# Patient Record
Sex: Female | Born: 1980 | Race: Black or African American | Hispanic: No | Marital: Single | Smoking: Former smoker
Health system: Southern US, Community
[De-identification: ages and names within clinical notes are randomized; demographics above are authoritative.]

## PROBLEM LIST (undated history)

## (undated) DIAGNOSIS — M329 Systemic lupus erythematosus, unspecified: Principal | ICD-10-CM

## (undated) DIAGNOSIS — IMO0002 Reserved for concepts with insufficient information to code with codable children: Secondary | ICD-10-CM

## (undated) DIAGNOSIS — D649 Anemia, unspecified: Secondary | ICD-10-CM

## (undated) DIAGNOSIS — R51 Headache: Secondary | ICD-10-CM

## (undated) DIAGNOSIS — M3214 Glomerular disease in systemic lupus erythematosus: Secondary | ICD-10-CM

## (undated) DIAGNOSIS — I313 Pericardial effusion (noninflammatory): Secondary | ICD-10-CM

## (undated) DIAGNOSIS — F329 Major depressive disorder, single episode, unspecified: Secondary | ICD-10-CM

## (undated) DIAGNOSIS — M879 Osteonecrosis, unspecified: Secondary | ICD-10-CM

## (undated) DIAGNOSIS — M1990 Unspecified osteoarthritis, unspecified site: Secondary | ICD-10-CM

## (undated) DIAGNOSIS — F419 Anxiety disorder, unspecified: Secondary | ICD-10-CM

## (undated) DIAGNOSIS — F32A Depression, unspecified: Secondary | ICD-10-CM

## (undated) DIAGNOSIS — I3139 Other pericardial effusion (noninflammatory): Secondary | ICD-10-CM

## (undated) DIAGNOSIS — Z8619 Personal history of other infectious and parasitic diseases: Secondary | ICD-10-CM

---
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
## 2002-03-13 ENCOUNTER — Emergency Department (HOSPITAL_COMMUNITY): Admission: EM | Admit: 2002-03-13 | Discharge: 2002-03-13 | Payer: Self-pay | Admitting: Emergency Medicine

## 2002-03-14 ENCOUNTER — Inpatient Hospital Stay (HOSPITAL_COMMUNITY): Admission: AD | Admit: 2002-03-14 | Discharge: 2002-03-14 | Payer: Self-pay | Admitting: Obstetrics and Gynecology

## 2002-03-14 ENCOUNTER — Encounter: Payer: Self-pay | Admitting: Emergency Medicine

## 2002-10-05 ENCOUNTER — Inpatient Hospital Stay (HOSPITAL_COMMUNITY): Admission: AD | Admit: 2002-10-05 | Discharge: 2002-10-05 | Payer: Self-pay | Admitting: *Deleted

## 2002-12-17 ENCOUNTER — Emergency Department (HOSPITAL_COMMUNITY): Admission: EM | Admit: 2002-12-17 | Discharge: 2002-12-17 | Payer: Self-pay | Admitting: Emergency Medicine

## 2002-12-17 ENCOUNTER — Encounter: Payer: Self-pay | Admitting: Emergency Medicine

## 2002-12-29 ENCOUNTER — Ambulatory Visit (HOSPITAL_COMMUNITY): Admission: RE | Admit: 2002-12-29 | Discharge: 2002-12-29 | Payer: Self-pay | Admitting: Obstetrics and Gynecology

## 2002-12-29 ENCOUNTER — Encounter: Payer: Self-pay | Admitting: Obstetrics and Gynecology

## 2003-11-22 ENCOUNTER — Inpatient Hospital Stay (HOSPITAL_COMMUNITY): Admission: AD | Admit: 2003-11-22 | Discharge: 2003-11-22 | Payer: Self-pay | Admitting: Obstetrics & Gynecology

## 2004-01-11 ENCOUNTER — Other Ambulatory Visit: Admission: RE | Admit: 2004-01-11 | Discharge: 2004-01-11 | Payer: Self-pay | Admitting: Obstetrics and Gynecology

## 2004-04-06 ENCOUNTER — Emergency Department (HOSPITAL_COMMUNITY): Admission: EM | Admit: 2004-04-06 | Discharge: 2004-04-07 | Payer: Self-pay | Admitting: Emergency Medicine

## 2006-01-13 ENCOUNTER — Emergency Department (HOSPITAL_COMMUNITY): Admission: EM | Admit: 2006-01-13 | Discharge: 2006-01-13 | Payer: Self-pay | Admitting: Emergency Medicine

## 2006-01-18 ENCOUNTER — Inpatient Hospital Stay (HOSPITAL_COMMUNITY): Admission: AD | Admit: 2006-01-18 | Discharge: 2006-01-18 | Payer: Self-pay | Admitting: Obstetrics and Gynecology

## 2006-01-30 ENCOUNTER — Other Ambulatory Visit: Admission: RE | Admit: 2006-01-30 | Discharge: 2006-01-30 | Payer: Self-pay | Admitting: Obstetrics and Gynecology

## 2006-04-16 ENCOUNTER — Inpatient Hospital Stay (HOSPITAL_COMMUNITY): Admission: AD | Admit: 2006-04-16 | Discharge: 2006-04-16 | Payer: Self-pay | Admitting: Obstetrics and Gynecology

## 2006-05-29 ENCOUNTER — Inpatient Hospital Stay (HOSPITAL_COMMUNITY): Admission: AD | Admit: 2006-05-29 | Discharge: 2006-05-29 | Payer: Self-pay | Admitting: Gynecology

## 2006-08-01 ENCOUNTER — Inpatient Hospital Stay (HOSPITAL_COMMUNITY): Admission: AD | Admit: 2006-08-01 | Discharge: 2006-08-04 | Payer: Self-pay | Admitting: Obstetrics and Gynecology

## 2009-10-31 HISTORY — PX: DILATION AND EVACUATION: SHX1459

## 2009-12-31 DIAGNOSIS — Z8619 Personal history of other infectious and parasitic diseases: Secondary | ICD-10-CM

## 2009-12-31 HISTORY — DX: Personal history of other infectious and parasitic diseases: Z86.19

## 2010-03-29 ENCOUNTER — Ambulatory Visit: Payer: Self-pay | Admitting: Cardiovascular Disease

## 2010-03-29 ENCOUNTER — Inpatient Hospital Stay (HOSPITAL_COMMUNITY): Admission: EM | Admit: 2010-03-29 | Discharge: 2010-04-15 | Payer: Self-pay | Admitting: Emergency Medicine

## 2010-03-31 ENCOUNTER — Encounter (INDEPENDENT_AMBULATORY_CARE_PROVIDER_SITE_OTHER): Payer: Self-pay | Admitting: Internal Medicine

## 2010-04-04 ENCOUNTER — Encounter (INDEPENDENT_AMBULATORY_CARE_PROVIDER_SITE_OTHER): Payer: Self-pay | Admitting: Internal Medicine

## 2010-04-07 ENCOUNTER — Encounter (INDEPENDENT_AMBULATORY_CARE_PROVIDER_SITE_OTHER): Payer: Self-pay | Admitting: Internal Medicine

## 2010-04-14 ENCOUNTER — Encounter: Payer: Self-pay | Admitting: Emergency Medicine

## 2010-04-22 ENCOUNTER — Inpatient Hospital Stay (HOSPITAL_COMMUNITY): Admission: EM | Admit: 2010-04-22 | Discharge: 2010-04-27 | Payer: Self-pay | Admitting: Emergency Medicine

## 2010-05-10 ENCOUNTER — Emergency Department (HOSPITAL_COMMUNITY): Admission: EM | Admit: 2010-05-10 | Discharge: 2010-05-11 | Payer: Self-pay | Admitting: Emergency Medicine

## 2010-06-24 ENCOUNTER — Emergency Department (HOSPITAL_COMMUNITY): Admission: EM | Admit: 2010-06-24 | Discharge: 2010-06-24 | Payer: Self-pay | Admitting: Emergency Medicine

## 2010-06-28 ENCOUNTER — Inpatient Hospital Stay (HOSPITAL_COMMUNITY): Admission: EM | Admit: 2010-06-28 | Discharge: 2010-07-01 | Payer: Self-pay | Admitting: Emergency Medicine

## 2010-07-08 ENCOUNTER — Inpatient Hospital Stay (HOSPITAL_COMMUNITY): Admission: EM | Admit: 2010-07-08 | Discharge: 2010-07-17 | Payer: Self-pay | Admitting: Emergency Medicine

## 2010-09-23 ENCOUNTER — Emergency Department (HOSPITAL_COMMUNITY): Admission: EM | Admit: 2010-09-23 | Discharge: 2010-09-23 | Payer: Self-pay | Admitting: Emergency Medicine

## 2010-11-20 ENCOUNTER — Emergency Department (HOSPITAL_COMMUNITY): Admission: EM | Admit: 2010-11-20 | Discharge: 2010-11-20 | Payer: Self-pay | Admitting: Emergency Medicine

## 2010-11-22 ENCOUNTER — Observation Stay (HOSPITAL_COMMUNITY)
Admission: EM | Admit: 2010-11-22 | Discharge: 2010-11-25 | Payer: Self-pay | Source: Home / Self Care | Admitting: Emergency Medicine

## 2010-11-30 ENCOUNTER — Observation Stay (HOSPITAL_COMMUNITY)
Admission: EM | Admit: 2010-11-30 | Discharge: 2010-12-02 | Payer: Self-pay | Source: Home / Self Care | Admitting: Emergency Medicine

## 2010-12-31 DIAGNOSIS — Z8619 Personal history of other infectious and parasitic diseases: Secondary | ICD-10-CM

## 2010-12-31 HISTORY — DX: Personal history of other infectious and parasitic diseases: Z86.19

## 2011-01-31 ENCOUNTER — Emergency Department (HOSPITAL_COMMUNITY)
Admission: EM | Admit: 2011-01-31 | Discharge: 2011-01-31 | Disposition: A | Discharge status: Home / Self Care | Payer: Medicaid Other | Attending: Emergency Medicine | Admitting: Emergency Medicine

## 2011-01-31 DIAGNOSIS — R21 Rash and other nonspecific skin eruption: Secondary | ICD-10-CM | POA: Insufficient documentation

## 2011-01-31 DIAGNOSIS — N898 Other specified noninflammatory disorders of vagina: Secondary | ICD-10-CM | POA: Insufficient documentation

## 2011-01-31 DIAGNOSIS — R109 Unspecified abdominal pain: Secondary | ICD-10-CM | POA: Insufficient documentation

## 2011-01-31 DIAGNOSIS — M329 Systemic lupus erythematosus, unspecified: Secondary | ICD-10-CM | POA: Insufficient documentation

## 2011-01-31 DIAGNOSIS — R51 Headache: Secondary | ICD-10-CM | POA: Insufficient documentation

## 2011-01-31 DIAGNOSIS — IMO0001 Reserved for inherently not codable concepts without codable children: Secondary | ICD-10-CM | POA: Insufficient documentation

## 2011-01-31 LAB — COMPREHENSIVE METABOLIC PANEL
AST: 23 U/L (ref 0–37)
Albumin: 3.5 g/dL (ref 3.5–5.2)
Alkaline Phosphatase: 51 U/L (ref 39–117)
BUN: 7 mg/dL (ref 6–23)
CO2: 26 mEq/L (ref 19–32)
Calcium: 9.1 mg/dL (ref 8.4–10.5)
Creatinine, Ser: 0.62 mg/dL (ref 0.4–1.2)
GFR calc Af Amer: >60 mL/min (ref >60)
Glucose, Bld: 87 mg/dL (ref 70–99)
Sodium: 139 mEq/L (ref 135–145)

## 2011-01-31 LAB — URINE MICROSCOPIC-ADD ON

## 2011-01-31 LAB — URINALYSIS, ROUTINE W REFLEX MICROSCOPIC
Bilirubin Urine: NEGATIVE
Leukocytes, UA: NEGATIVE
Protein, ur: 100 mg/dL — AB
Specific Gravity, Urine: 1.013 (ref 1.005–1.030)
Urobilinogen, UA: 0.2 mg/dL (ref 0.0–1.0)
pH: 7.5 (ref 5.0–8.0)

## 2011-01-31 LAB — ACETAMINOPHEN LEVEL: Acetaminophen (Tylenol), Serum: <10 ug/mL — ABNORMAL LOW (ref 10–30)

## 2011-01-31 LAB — CBC
MCH: 30.2 pg (ref 26.0–34.0)
MCHC: 32.4 g/dL (ref 30.0–36.0)
MCV: 93.2 fL (ref 78.0–100.0)
RDW: 12.8 % (ref 11.5–15.5)
WBC: 4 10*3/uL (ref 4.0–10.5)

## 2011-01-31 LAB — DIFFERENTIAL
Lymphocytes Relative: 34 % (ref 12–46)
Lymphs Abs: 1.4 10*3/uL (ref 0.7–4.0)

## 2011-03-10 ENCOUNTER — Emergency Department (HOSPITAL_COMMUNITY)
Admission: EM | Admit: 2011-03-10 | Discharge: 2011-03-10 | Disposition: A | Discharge status: Home / Self Care | Payer: Medicaid Other | Attending: Emergency Medicine | Admitting: Emergency Medicine

## 2011-03-10 DIAGNOSIS — O169 Unspecified maternal hypertension, unspecified trimester: Secondary | ICD-10-CM | POA: Insufficient documentation

## 2011-03-10 DIAGNOSIS — IMO0001 Reserved for inherently not codable concepts without codable children: Secondary | ICD-10-CM | POA: Insufficient documentation

## 2011-03-10 DIAGNOSIS — O99891 Other specified diseases and conditions complicating pregnancy: Secondary | ICD-10-CM | POA: Insufficient documentation

## 2011-03-10 DIAGNOSIS — M329 Systemic lupus erythematosus, unspecified: Secondary | ICD-10-CM | POA: Insufficient documentation

## 2011-03-10 DIAGNOSIS — R21 Rash and other nonspecific skin eruption: Secondary | ICD-10-CM | POA: Insufficient documentation

## 2011-03-10 LAB — CBC
HCT: 35.2 % — ABNORMAL LOW (ref 36.0–46.0)
MCH: 29.9 pg (ref 26.0–34.0)
MCV: 91.4 fL (ref 78.0–100.0)
Platelets: 229 10*3/uL (ref 150–400)
RBC: 3.85 MIL/uL — ABNORMAL LOW (ref 3.87–5.11)
WBC: 3.5 10*3/uL — ABNORMAL LOW (ref 4.0–10.5)

## 2011-03-10 LAB — URINE MICROSCOPIC-ADD ON

## 2011-03-10 LAB — DIFFERENTIAL
Basophils Relative: 2 % — ABNORMAL HIGH (ref 0–1)
Eosinophils Relative: 2 % (ref 0–5)
Lymphs Abs: 1 10*3/uL (ref 0.7–4.0)

## 2011-03-10 LAB — POCT I-STAT, CHEM 8
BUN: 7 mg/dL (ref 6–23)
Chloride: 110 mEq/L (ref 96–112)
Hemoglobin: 12.6 g/dL (ref 12.0–15.0)
Sodium: 141 mEq/L (ref 135–145)
TCO2: 19 mmol/L (ref 0–100)

## 2011-03-10 LAB — URINALYSIS, ROUTINE W REFLEX MICROSCOPIC
Bilirubin Urine: NEGATIVE
Leukocytes, UA: NEGATIVE
Protein, ur: >300 mg/dL — AB
Specific Gravity, Urine: 1.034 — ABNORMAL HIGH (ref 1.005–1.030)
pH: 7 (ref 5.0–8.0)

## 2011-03-12 LAB — IRON AND TIBC
Iron: 64 ug/dL (ref 42–135)
Saturation Ratios: 25 % (ref 20–55)
UIBC: 191 ug/dL

## 2011-03-12 LAB — CBC
HCT: 28.7 % — ABNORMAL LOW (ref 36.0–46.0)
HCT: 32.5 % — ABNORMAL LOW (ref 36.0–46.0)
Hemoglobin: 9.2 g/dL — ABNORMAL LOW (ref 12.0–15.0)
MCH: 32.3 pg (ref 26.0–34.0)
Platelets: 364 10*3/uL (ref 150–400)
RBC: 3.25 MIL/uL — ABNORMAL LOW (ref 3.87–5.11)
RBC: 3.34 MIL/uL — ABNORMAL LOW (ref 3.87–5.11)
RDW: 14.4 % (ref 11.5–15.5)
WBC: 5.2 10*3/uL (ref 4.0–10.5)
WBC: 8.1 10*3/uL (ref 4.0–10.5)

## 2011-03-12 LAB — COMPREHENSIVE METABOLIC PANEL
ALT: 14 U/L (ref 0–35)
AST: 15 U/L (ref 0–37)
Albumin: 3.1 g/dL — ABNORMAL LOW (ref 3.5–5.2)
Alkaline Phosphatase: 36 U/L — ABNORMAL LOW (ref 39–117)
Alkaline Phosphatase: 39 U/L (ref 39–117)
CO2: 24 mEq/L (ref 19–32)
Chloride: 109 mEq/L (ref 96–112)
Chloride: 111 mEq/L (ref 96–112)
GFR calc Af Amer: >60 mL/min (ref >60)
GFR calc non Af Amer: >60 mL/min (ref >60)
Glucose, Bld: 167 mg/dL — ABNORMAL HIGH (ref 70–99)
Potassium: 3.8 mEq/L (ref 3.5–5.1)
Potassium: 4.4 mEq/L (ref 3.5–5.1)
Sodium: 140 mEq/L (ref 135–145)
Total Bilirubin: 0.3 mg/dL (ref 0.3–1.2)
Total Protein: 6.2 g/dL (ref 6.0–8.3)

## 2011-03-12 LAB — STOOL CULTURE

## 2011-03-12 LAB — BASIC METABOLIC PANEL
BUN: 7 mg/dL (ref 6–23)
CO2: 29 mEq/L (ref 19–32)
Creatinine, Ser: 0.78 mg/dL (ref 0.4–1.2)
GFR calc Af Amer: >60 mL/min (ref >60)
Glucose, Bld: 81 mg/dL (ref 70–99)
Potassium: 3 mEq/L — ABNORMAL LOW (ref 3.5–5.1)

## 2011-03-12 LAB — URINALYSIS, ROUTINE W REFLEX MICROSCOPIC
Bilirubin Urine: NEGATIVE
Glucose, UA: NEGATIVE mg/dL
Nitrite: NEGATIVE
Protein, ur: >300 mg/dL — AB
Specific Gravity, Urine: 1.021 (ref 1.005–1.030)

## 2011-03-12 LAB — URINE CULTURE
Colony Count: 50000
Culture  Setup Time: 201112021335
Special Requests: NEGATIVE

## 2011-03-12 LAB — RAPID URINE DRUG SCREEN, HOSP PERFORMED
Barbiturates: NOT DETECTED
Opiates: NOT DETECTED
Tetrahydrocannabinol: POSITIVE — AB

## 2011-03-12 LAB — URINE MICROSCOPIC-ADD ON

## 2011-03-12 LAB — POCT PREGNANCY, URINE: Preg Test, Ur: NEGATIVE

## 2011-03-12 LAB — DIFFERENTIAL
Basophils Relative: 0 % (ref 0–1)
Eosinophils Absolute: 0 10*3/uL (ref 0.0–0.7)
Eosinophils Relative: 0 % (ref 0–5)
Lymphocytes Relative: 23 % (ref 12–46)
Lymphs Abs: 1.7 10*3/uL (ref 0.7–4.0)
Monocytes Absolute: 1.4 10*3/uL — ABNORMAL HIGH (ref 0.1–1.0)
Monocytes Relative: 12 % (ref 3–12)
Neutrophils Relative %: 58 % (ref 43–77)
Neutrophils Relative %: 76 % (ref 43–77)

## 2011-03-12 LAB — GLUCOSE, CAPILLARY
Glucose-Capillary: 135 mg/dL — ABNORMAL HIGH (ref 70–99)
Glucose-Capillary: 143 mg/dL — ABNORMAL HIGH (ref 70–99)
Glucose-Capillary: 145 mg/dL — ABNORMAL HIGH (ref 70–99)

## 2011-03-12 LAB — SEDIMENTATION RATE
Sed Rate: 15 mm/hr (ref 0–22)
Sed Rate: 22 mm/hr (ref 0–22)

## 2011-03-12 LAB — RETICULOCYTES
RBC.: 3.21 MIL/uL — ABNORMAL LOW (ref 3.87–5.11)
Retic Ct Pct: 4.1 % — ABNORMAL HIGH (ref 0.4–3.1)

## 2011-03-12 LAB — T4, FREE: Free T4: 1.18 ng/dL (ref 0.80–1.80)

## 2011-03-12 LAB — HEMOCCULT GUIAC POC 1CARD (OFFICE): Fecal Occult Bld: NEGATIVE

## 2011-03-12 LAB — OVA AND PARASITE EXAMINATION

## 2011-03-12 LAB — LIPASE, BLOOD: Lipase: 23 U/L (ref 11–59)

## 2011-03-12 LAB — FERRITIN: Ferritin: 82 ng/mL (ref 10–291)

## 2011-03-12 LAB — C-REACTIVE PROTEIN: CRP: 0 mg/dL — ABNORMAL LOW (ref <0.6)

## 2011-03-13 LAB — CULTURE, BLOOD (ROUTINE X 2)
Culture  Setup Time: 201111231630
Culture: NO GROWTH

## 2011-03-13 LAB — URINALYSIS, ROUTINE W REFLEX MICROSCOPIC
Bilirubin Urine: NEGATIVE
Glucose, UA: NEGATIVE mg/dL
Ketones, ur: >80 mg/dL — AB
Leukocytes, UA: NEGATIVE
Nitrite: NEGATIVE
Protein, ur: >300 mg/dL — AB
Specific Gravity, Urine: 1.016 (ref 1.005–1.030)
Urobilinogen, UA: 0.2 mg/dL (ref 0.0–1.0)
Urobilinogen, UA: 1 mg/dL (ref 0.0–1.0)

## 2011-03-13 LAB — CBC
HCT: 27 % — ABNORMAL LOW (ref 36.0–46.0)
HCT: 27.6 % — ABNORMAL LOW (ref 36.0–46.0)
HCT: 28.5 % — ABNORMAL LOW (ref 36.0–46.0)
HCT: 35.3 % — ABNORMAL LOW (ref 36.0–46.0)
Hemoglobin: 11.7 g/dL — ABNORMAL LOW (ref 12.0–15.0)
Hemoglobin: 12.1 g/dL (ref 12.0–15.0)
Hemoglobin: 9.2 g/dL — ABNORMAL LOW (ref 12.0–15.0)
MCH: 32.5 pg (ref 26.0–34.0)
MCHC: 34.1 g/dL (ref 30.0–36.0)
MCHC: 34.5 g/dL (ref 30.0–36.0)
MCV: 95.6 fL (ref 78.0–100.0)
Platelets: 193 10*3/uL (ref 150–400)
RBC: 2.81 MIL/uL — ABNORMAL LOW (ref 3.87–5.11)
RBC: 2.98 MIL/uL — ABNORMAL LOW (ref 3.87–5.11)
RDW: 14.5 % (ref 11.5–15.5)
RDW: 15 % (ref 11.5–15.5)
RDW: 15.1 % (ref 11.5–15.5)
RDW: 15.4 % (ref 11.5–15.5)
WBC: 2 10*3/uL — ABNORMAL LOW (ref 4.0–10.5)
WBC: 2.8 10*3/uL — ABNORMAL LOW (ref 4.0–10.5)
WBC: 3.9 10*3/uL — ABNORMAL LOW (ref 4.0–10.5)
WBC: 4.3 10*3/uL (ref 4.0–10.5)

## 2011-03-13 LAB — BASIC METABOLIC PANEL
BUN: 3 mg/dL — ABNORMAL LOW (ref 6–23)
CO2: 21 mEq/L (ref 19–32)
CO2: 25 mEq/L (ref 19–32)
Chloride: 109 mEq/L (ref 96–112)
Chloride: 111 mEq/L (ref 96–112)
Creatinine, Ser: 0.77 mg/dL (ref 0.4–1.2)
GFR calc Af Amer: >60 mL/min (ref >60)
GFR calc Af Amer: >60 mL/min (ref >60)
GFR calc non Af Amer: >60 mL/min (ref >60)
Glucose, Bld: 167 mg/dL — ABNORMAL HIGH (ref 70–99)
Potassium: 3.7 mEq/L (ref 3.5–5.1)
Potassium: 3.9 mEq/L (ref 3.5–5.1)
Potassium: 4 mEq/L (ref 3.5–5.1)
Sodium: 142 mEq/L (ref 135–145)

## 2011-03-13 LAB — COMPREHENSIVE METABOLIC PANEL
ALT: 18 U/L (ref 0–35)
AST: 39 U/L — ABNORMAL HIGH (ref 0–37)
Alkaline Phosphatase: 47 U/L (ref 39–117)
CO2: 20 mEq/L (ref 19–32)
Calcium: 9.1 mg/dL (ref 8.4–10.5)
GFR calc Af Amer: >60 mL/min (ref >60)
Glucose, Bld: 103 mg/dL — ABNORMAL HIGH (ref 70–99)
Potassium: 3.6 mEq/L (ref 3.5–5.1)
Sodium: 137 mEq/L (ref 135–145)
Total Protein: 7.5 g/dL (ref 6.0–8.3)

## 2011-03-13 LAB — DIFFERENTIAL
Basophils Absolute: 0 10*3/uL (ref 0.0–0.1)
Basophils Relative: 1 % (ref 0–1)
Basophils Relative: 1 % (ref 0–1)
Eosinophils Absolute: 0 10*3/uL (ref 0.0–0.7)
Eosinophils Absolute: 0 10*3/uL (ref 0.0–0.7)
Eosinophils Relative: 0 % (ref 0–5)
Lymphs Abs: 0.6 10*3/uL — ABNORMAL LOW (ref 0.7–4.0)
Monocytes Relative: 7 % (ref 3–12)
Neutro Abs: 2.1 10*3/uL (ref 1.7–7.7)
Neutrophils Relative %: 70 % (ref 43–77)
Neutrophils Relative %: 72 % (ref 43–77)

## 2011-03-13 LAB — POCT PREGNANCY, URINE
Preg Test, Ur: NEGATIVE
Preg Test, Ur: NEGATIVE

## 2011-03-13 LAB — URINE MICROSCOPIC-ADD ON

## 2011-03-13 LAB — POCT I-STAT, CHEM 8
Chloride: 105 mEq/L (ref 96–112)
HCT: 36 % (ref 36.0–46.0)
Hemoglobin: 12.2 g/dL (ref 12.0–15.0)
Potassium: 3.3 mEq/L — ABNORMAL LOW (ref 3.5–5.1)

## 2011-03-13 LAB — URINE CULTURE
Colony Count: 45000
Culture  Setup Time: 201111241856
Special Requests: NEGATIVE

## 2011-03-13 LAB — INFLUENZA PANEL BY PCR (TYPE A & B): Influenza B By PCR: NEGATIVE

## 2011-03-14 ENCOUNTER — Inpatient Hospital Stay (HOSPITAL_COMMUNITY)
Admission: AD | Admit: 2011-03-14 | Discharge: 2011-03-14 | Disposition: A | Discharge status: Home / Self Care | Payer: Medicaid Other | Source: Ambulatory Visit | Attending: Obstetrics and Gynecology | Admitting: Obstetrics and Gynecology

## 2011-03-14 DIAGNOSIS — O209 Hemorrhage in early pregnancy, unspecified: Secondary | ICD-10-CM | POA: Insufficient documentation

## 2011-03-14 LAB — WET PREP, GENITAL: Yeast Wet Prep HPF POC: NONE SEEN

## 2011-03-14 LAB — URINALYSIS, ROUTINE W REFLEX MICROSCOPIC
Glucose, UA: NEGATIVE mg/dL
Leukocytes, UA: NEGATIVE
Specific Gravity, Urine: 1.025 (ref 1.005–1.030)
pH: 7 (ref 5.0–8.0)

## 2011-03-14 LAB — URINE MICROSCOPIC-ADD ON

## 2011-03-14 LAB — CBC
MCHC: 31 g/dL (ref 30.0–36.0)
Platelets: 199 10*3/uL (ref 150–400)
RDW: 12.6 % (ref 11.5–15.5)
WBC: 2.9 10*3/uL — ABNORMAL LOW (ref 4.0–10.5)

## 2011-03-14 LAB — ABO/RH: ABO/RH(D): O POS

## 2011-03-15 ENCOUNTER — Inpatient Hospital Stay (HOSPITAL_COMMUNITY)
Admission: AD | Admit: 2011-03-15 | Discharge: 2011-03-15 | Disposition: A | Discharge status: Home / Self Care | Payer: Medicaid Other | Source: Ambulatory Visit | Attending: Obstetrics and Gynecology | Admitting: Obstetrics and Gynecology

## 2011-03-15 ENCOUNTER — Inpatient Hospital Stay (HOSPITAL_COMMUNITY): Payer: Medicaid Other

## 2011-03-15 DIAGNOSIS — O039 Complete or unspecified spontaneous abortion without complication: Secondary | ICD-10-CM | POA: Insufficient documentation

## 2011-03-15 DIAGNOSIS — O209 Hemorrhage in early pregnancy, unspecified: Secondary | ICD-10-CM

## 2011-03-15 DIAGNOSIS — O26899 Other specified pregnancy related conditions, unspecified trimester: Secondary | ICD-10-CM

## 2011-03-15 LAB — URINALYSIS, ROUTINE W REFLEX MICROSCOPIC
Bilirubin Urine: NEGATIVE
Nitrite: NEGATIVE
Specific Gravity, Urine: 1.016 (ref 1.005–1.030)
Urobilinogen, UA: 0.2 mg/dL (ref 0.0–1.0)
pH: 7 (ref 5.0–8.0)

## 2011-03-15 LAB — URINE CULTURE
Colony Count: NO GROWTH
Culture  Setup Time: 201203150116
Culture: NO GROWTH

## 2011-03-15 LAB — CBC
Hemoglobin: 10.5 g/dL — ABNORMAL LOW (ref 12.0–15.0)
MCH: 28.5 pg (ref 26.0–34.0)
MCHC: 31.3 g/dL (ref 30.0–36.0)
MCV: 91.1 fL (ref 78.0–100.0)
RBC: 3.69 MIL/uL — ABNORMAL LOW (ref 3.87–5.11)

## 2011-03-15 LAB — GC/CHLAMYDIA PROBE AMP, GENITAL: GC Probe Amp, Genital: NEGATIVE

## 2011-03-15 LAB — PREGNANCY, URINE: Preg Test, Ur: NEGATIVE

## 2011-03-15 LAB — URINE MICROSCOPIC-ADD ON

## 2011-03-17 LAB — CBC
Hemoglobin: 8.3 g/dL — ABNORMAL LOW (ref 12.0–15.0)
MCH: 31.7 pg (ref 26.0–34.0)
MCHC: 33.6 g/dL (ref 30.0–36.0)
Platelets: 257 10*3/uL (ref 150–400)
RBC: 2.69 MIL/uL — ABNORMAL LOW (ref 3.87–5.11)
RDW: 15.8 % — ABNORMAL HIGH (ref 11.5–15.5)
RDW: 16.2 % — ABNORMAL HIGH (ref 11.5–15.5)
WBC: 2 10*3/uL — ABNORMAL LOW (ref 4.0–10.5)

## 2011-03-17 LAB — BASIC METABOLIC PANEL
BUN: 9 mg/dL (ref 6–23)
CO2: 25 mEq/L (ref 19–32)
Calcium: 8.7 mg/dL (ref 8.4–10.5)
Glucose, Bld: 220 mg/dL — ABNORMAL HIGH (ref 70–99)
Sodium: 141 mEq/L (ref 135–145)

## 2011-03-17 LAB — COMPREHENSIVE METABOLIC PANEL
ALT: 17 U/L (ref 0–35)
AST: 31 U/L (ref 0–37)
Albumin: 2.2 g/dL — ABNORMAL LOW (ref 3.5–5.2)
Alkaline Phosphatase: 37 U/L — ABNORMAL LOW (ref 39–117)
BUN: 4 mg/dL — ABNORMAL LOW (ref 6–23)
Chloride: 110 mEq/L (ref 96–112)
GFR calc Af Amer: >60 mL/min (ref >60)
Potassium: 3.7 mEq/L (ref 3.5–5.1)
Sodium: 138 mEq/L (ref 135–145)
Total Protein: 5.8 g/dL — ABNORMAL LOW (ref 6.0–8.3)

## 2011-03-18 LAB — DIFFERENTIAL
Basophils Absolute: 0 10*3/uL (ref 0.0–0.1)
Basophils Absolute: 0 10*3/uL (ref 0.0–0.1)
Basophils Relative: 0 % (ref 0–1)
Basophils Relative: 0 % (ref 0–1)
Eosinophils Absolute: 0 10*3/uL (ref 0.0–0.7)
Eosinophils Absolute: 0 10*3/uL (ref 0.0–0.7)
Eosinophils Absolute: 0 10*3/uL (ref 0.0–0.7)
Eosinophils Relative: 0 % (ref 0–5)
Eosinophils Relative: 0 % (ref 0–5)
Eosinophils Relative: 0 % (ref 0–5)
Eosinophils Relative: 0 % (ref 0–5)
Lymphocytes Relative: 14 % (ref 12–46)
Lymphocytes Relative: 9 % — ABNORMAL LOW (ref 12–46)
Lymphs Abs: 1 10*3/uL (ref 0.7–4.0)
Lymphs Abs: 1 10*3/uL (ref 0.7–4.0)
Lymphs Abs: 0.2 10*3/uL — ABNORMAL LOW (ref 0.7–4.0)
Monocytes Absolute: 0.1 10*3/uL (ref 0.1–1.0)
Monocytes Absolute: 0.2 10*3/uL (ref 0.1–1.0)
Monocytes Absolute: 0.3 10*3/uL (ref 0.1–1.0)
Monocytes Absolute: 0.3 10*3/uL (ref 0.1–1.0)
Monocytes Relative: 4 % (ref 3–12)
Monocytes Relative: 6 % (ref 3–12)
Monocytes Relative: 7 % (ref 3–12)
Neutro Abs: 2.2 10*3/uL (ref 1.7–7.7)
Neutrophils Relative %: 89 % — ABNORMAL HIGH (ref 43–77)

## 2011-03-18 LAB — CLOSTRIDIUM DIFFICILE EIA

## 2011-03-18 LAB — URINALYSIS, ROUTINE W REFLEX MICROSCOPIC
Bilirubin Urine: NEGATIVE
Glucose, UA: NEGATIVE mg/dL
Glucose, UA: NEGATIVE mg/dL
Ketones, ur: NEGATIVE mg/dL
Leukocytes, UA: NEGATIVE
Nitrite: NEGATIVE
Nitrite: NEGATIVE
Protein, ur: >300 mg/dL — AB
Protein, ur: >300 mg/dL — AB
Specific Gravity, Urine: 1.029 (ref 1.005–1.030)
Specific Gravity, Urine: 1.03 (ref 1.005–1.030)
pH: 7.5 (ref 5.0–8.0)
pH: 7 (ref 5.0–8.0)
pH: 7 (ref 5.0–8.0)

## 2011-03-18 LAB — GC/CHLAMYDIA PROBE AMP, GENITAL
Chlamydia, DNA Probe: POSITIVE — AB
GC Probe Amp, Genital: POSITIVE — AB

## 2011-03-18 LAB — COMPREHENSIVE METABOLIC PANEL
AST: 20 U/L (ref 0–37)
Albumin: 2.5 g/dL — ABNORMAL LOW (ref 3.5–5.2)
Albumin: 2.6 g/dL — ABNORMAL LOW (ref 3.5–5.2)
Alkaline Phosphatase: 37 U/L — ABNORMAL LOW (ref 39–117)
BUN: 5 mg/dL — ABNORMAL LOW (ref 6–23)
BUN: 9 mg/dL (ref 6–23)
CO2: 25 mEq/L (ref 19–32)
Calcium: 8.3 mg/dL — ABNORMAL LOW (ref 8.4–10.5)
Calcium: 8.6 mg/dL (ref 8.4–10.5)
Chloride: 100 mEq/L (ref 96–112)
Creatinine, Ser: 0.65 mg/dL (ref 0.4–1.2)
Creatinine, Ser: 0.7 mg/dL (ref 0.4–1.2)
Creatinine, Ser: 0.85 mg/dL (ref 0.4–1.2)
GFR calc non Af Amer: >60 mL/min (ref >60)
Glucose, Bld: 114 mg/dL — ABNORMAL HIGH (ref 70–99)
Potassium: 3.8 mEq/L (ref 3.5–5.1)
Total Bilirubin: 0.3 mg/dL (ref 0.3–1.2)
Total Bilirubin: 0.4 mg/dL (ref 0.3–1.2)
Total Protein: 6.3 g/dL (ref 6.0–8.3)

## 2011-03-18 LAB — HIV ANTIBODY (ROUTINE TESTING W REFLEX): HIV: NONREACTIVE

## 2011-03-18 LAB — IRON AND TIBC
Saturation Ratios: 11 % — ABNORMAL LOW (ref 20–55)
TIBC: 171 ug/dL — ABNORMAL LOW (ref 250–470)
UIBC: 153 ug/dL

## 2011-03-18 LAB — CBC
HCT: 25.9 % — ABNORMAL LOW (ref 36.0–46.0)
HCT: 27.3 % — ABNORMAL LOW (ref 36.0–46.0)
HCT: 27.8 % — ABNORMAL LOW (ref 36.0–46.0)
HCT: 28.8 % — ABNORMAL LOW (ref 36.0–46.0)
HCT: 35 % — ABNORMAL LOW (ref 36.0–46.0)
Hemoglobin: 8.2 g/dL — ABNORMAL LOW (ref 12.0–15.0)
Hemoglobin: 8.8 g/dL — ABNORMAL LOW (ref 12.0–15.0)
Hemoglobin: 9.4 g/dL — ABNORMAL LOW (ref 12.0–15.0)
Hemoglobin: 9.9 g/dL — ABNORMAL LOW (ref 12.0–15.0)
Hemoglobin: 11.8 g/dL — ABNORMAL LOW (ref 12.0–15.0)
Hemoglobin: 15.3 g/dL — ABNORMAL HIGH (ref 12.0–15.0)
MCH: 32 pg (ref 26.0–34.0)
MCH: 32.4 pg (ref 26.0–34.0)
MCH: 32.5 pg (ref 26.0–34.0)
MCH: 32.8 pg (ref 26.0–34.0)
MCH: 31.1 pg (ref 26.0–34.0)
MCH: 32.3 pg (ref 26.0–34.0)
MCHC: 33.9 g/dL (ref 30.0–36.0)
MCHC: 33.9 g/dL (ref 30.0–36.0)
MCHC: 34.5 g/dL (ref 30.0–36.0)
MCHC: 34.5 g/dL (ref 30.0–36.0)
MCHC: 32.4 g/dL (ref 30.0–36.0)
MCHC: 33.8 g/dL (ref 30.0–36.0)
MCV: 94.2 fL (ref 78.0–100.0)
MCV: 94.4 fL (ref 78.0–100.0)
MCV: 95.6 fL (ref 78.0–100.0)
MCV: 95.7 fL (ref 78.0–100.0)
MCV: 95.3 fL (ref 78.0–100.0)
MCV: 96.1 fL (ref 78.0–100.0)
Platelets: 192 10*3/uL (ref 150–400)
Platelets: 194 10*3/uL (ref 150–400)
Platelets: 222 10*3/uL (ref 150–400)
Platelets: 296 10*3/uL (ref 150–400)
RBC: 2.53 MIL/uL — ABNORMAL LOW (ref 3.87–5.11)
RBC: 2.74 MIL/uL — ABNORMAL LOW (ref 3.87–5.11)
RBC: 2.95 MIL/uL — ABNORMAL LOW (ref 3.87–5.11)
RBC: 4.92 MIL/uL (ref 3.87–5.11)
RDW: 15.1 % (ref 11.5–15.5)
RDW: 15.9 % — ABNORMAL HIGH (ref 11.5–15.5)
RDW: 16.4 % — ABNORMAL HIGH (ref 11.5–15.5)
WBC: 4.4 10*3/uL (ref 4.0–10.5)
WBC: 3 10*3/uL — ABNORMAL LOW (ref 4.0–10.5)

## 2011-03-18 LAB — BASIC METABOLIC PANEL
BUN: 3 mg/dL — ABNORMAL LOW (ref 6–23)
BUN: 7 mg/dL (ref 6–23)
CO2: 21 mEq/L (ref 19–32)
CO2: 21 mEq/L (ref 19–32)
CO2: 27 mEq/L (ref 19–32)
Calcium: 7.5 mg/dL — ABNORMAL LOW (ref 8.4–10.5)
Calcium: 7.8 mg/dL — ABNORMAL LOW (ref 8.4–10.5)
Calcium: 9.2 mg/dL (ref 8.4–10.5)
Chloride: 112 mEq/L (ref 96–112)
Chloride: 113 mEq/L — ABNORMAL HIGH (ref 96–112)
Creatinine, Ser: 0.73 mg/dL (ref 0.4–1.2)
GFR calc non Af Amer: >60 mL/min (ref >60)
Glucose, Bld: 100 mg/dL — ABNORMAL HIGH (ref 70–99)
Glucose, Bld: 117 mg/dL — ABNORMAL HIGH (ref 70–99)
Glucose, Bld: 91 mg/dL (ref 70–99)
Potassium: 3.1 mEq/L — ABNORMAL LOW (ref 3.5–5.1)
Potassium: 3.2 mEq/L — ABNORMAL LOW (ref 3.5–5.1)
Potassium: 3.4 mEq/L — ABNORMAL LOW (ref 3.5–5.1)
Sodium: 137 mEq/L (ref 135–145)
Sodium: 138 mEq/L (ref 135–145)
Sodium: 142 mEq/L (ref 135–145)

## 2011-03-18 LAB — URINE MICROSCOPIC-ADD ON

## 2011-03-18 LAB — VITAMIN B12: Vitamin B-12: 538 pg/mL (ref 211–911)

## 2011-03-18 LAB — POCT I-STAT, CHEM 8
Calcium, Ion: 1.1 mmol/L — ABNORMAL LOW (ref 1.12–1.32)
Creatinine, Ser: 0.7 mg/dL (ref 0.4–1.2)
Glucose, Bld: 84 mg/dL (ref 70–99)
Hemoglobin: 9.5 g/dL — ABNORMAL LOW (ref 12.0–15.0)
Sodium: 137 mEq/L (ref 135–145)
TCO2: 22 mmol/L (ref 0–100)

## 2011-03-18 LAB — CULTURE, BLOOD (ROUTINE X 2)
Culture: NO GROWTH
Culture: NO GROWTH

## 2011-03-18 LAB — URINE CULTURE
Colony Count: 60000
Culture: NO GROWTH

## 2011-03-18 LAB — WET PREP, GENITAL: Trich, Wet Prep: NONE SEEN

## 2011-03-18 LAB — POCT PREGNANCY, URINE: Preg Test, Ur: NEGATIVE

## 2011-03-18 LAB — IGG, IGA, IGM: IgA: 300 mg/dL (ref 68–378)

## 2011-03-18 LAB — ANTI-NUCLEAR AB-TITER (ANA TITER): ANA Titer 1: NEGATIVE

## 2011-03-18 LAB — RETICULOCYTES: Retic Ct Pct: 0.9 % (ref 0.4–3.1)

## 2011-03-18 LAB — T3, FREE: T3, Free: 1.9 pg/mL — ABNORMAL LOW (ref 2.3–4.2)

## 2011-03-18 LAB — CREATININE, URINE, 24 HOUR
Creatinine, 24H Ur: 1158 mg/d (ref 700–1800)
Urine Total Volume-UCRE24: 2750 mL

## 2011-03-18 LAB — PREGNANCY, URINE: Preg Test, Ur: NEGATIVE

## 2011-03-18 LAB — PROTEIN, URINE, 24 HOUR
Protein, 24H Urine: 5720 mg/d — ABNORMAL HIGH (ref 50–100)
Urine Total Volume-UPROT: 2750 mL

## 2011-03-18 LAB — MAGNESIUM: Magnesium: 1.7 mg/dL (ref 1.5–2.5)

## 2011-03-20 LAB — URINALYSIS, ROUTINE W REFLEX MICROSCOPIC
Bilirubin Urine: NEGATIVE
Glucose, UA: NEGATIVE mg/dL
Nitrite: NEGATIVE
Protein, ur: >300 mg/dL — AB
Specific Gravity, Urine: 1.018 (ref 1.005–1.030)
Specific Gravity, Urine: 1.017 (ref 1.005–1.030)
Urobilinogen, UA: 0.2 mg/dL (ref 0.0–1.0)
pH: 7 (ref 5.0–8.0)

## 2011-03-20 LAB — BASIC METABOLIC PANEL
BUN: 3 mg/dL — ABNORMAL LOW (ref 6–23)
CO2: 31 mEq/L (ref 19–32)
CO2: 31 mEq/L (ref 19–32)
Calcium: 8.2 mg/dL — ABNORMAL LOW (ref 8.4–10.5)
Calcium: 8.6 mg/dL (ref 8.4–10.5)
Calcium: 8.8 mg/dL (ref 8.4–10.5)
Chloride: 97 mEq/L (ref 96–112)
Creatinine, Ser: 0.64 mg/dL (ref 0.4–1.2)
GFR calc Af Amer: >60 mL/min (ref >60)
GFR calc Af Amer: >60 mL/min (ref >60)
GFR calc non Af Amer: >60 mL/min (ref >60)
GFR calc non Af Amer: >60 mL/min (ref >60)
Glucose, Bld: 121 mg/dL — ABNORMAL HIGH (ref 70–99)
Glucose, Bld: 78 mg/dL (ref 70–99)
Potassium: 4.2 mEq/L (ref 3.5–5.1)
Sodium: 134 mEq/L — ABNORMAL LOW (ref 135–145)
Sodium: 138 mEq/L (ref 135–145)
Sodium: 139 mEq/L (ref 135–145)

## 2011-03-20 LAB — COMPREHENSIVE METABOLIC PANEL
ALT: 17 U/L (ref 0–35)
ALT: 15 U/L (ref 0–35)
ALT: 34 U/L (ref 0–35)
AST: 16 U/L (ref 0–37)
Albumin: 2.7 g/dL — ABNORMAL LOW (ref 3.5–5.2)
Alkaline Phosphatase: 40 U/L (ref 39–117)
Alkaline Phosphatase: 33 U/L — ABNORMAL LOW (ref 39–117)
BUN: 10 mg/dL (ref 6–23)
BUN: 8 mg/dL (ref 6–23)
CO2: 28 mEq/L (ref 19–32)
CO2: 29 mEq/L (ref 19–32)
CO2: 30 mEq/L (ref 19–32)
Calcium: 8.8 mg/dL (ref 8.4–10.5)
Calcium: 8 mg/dL — ABNORMAL LOW (ref 8.4–10.5)
Calcium: 8.1 mg/dL — ABNORMAL LOW (ref 8.4–10.5)
Calcium: 8.6 mg/dL (ref 8.4–10.5)
Chloride: 102 mEq/L (ref 96–112)
Creatinine, Ser: 0.54 mg/dL (ref 0.4–1.2)
Creatinine, Ser: 0.77 mg/dL (ref 0.4–1.2)
GFR calc Af Amer: >60 mL/min (ref >60)
GFR calc non Af Amer: >60 mL/min (ref >60)
GFR calc non Af Amer: >60 mL/min (ref >60)
GFR calc non Af Amer: >60 mL/min (ref >60)
Glucose, Bld: 110 mg/dL — ABNORMAL HIGH (ref 70–99)
Glucose, Bld: 116 mg/dL — ABNORMAL HIGH (ref 70–99)
Glucose, Bld: 92 mg/dL (ref 70–99)
Potassium: 3.1 mEq/L — ABNORMAL LOW (ref 3.5–5.1)
Potassium: 4 mEq/L (ref 3.5–5.1)
Sodium: 142 mEq/L (ref 135–145)
Sodium: 137 mEq/L (ref 135–145)
Total Protein: 5.8 g/dL — ABNORMAL LOW (ref 6.0–8.3)
Total Protein: 4.8 g/dL — ABNORMAL LOW (ref 6.0–8.3)

## 2011-03-20 LAB — DIFFERENTIAL
Basophils Absolute: 0.2 10*3/uL — ABNORMAL HIGH (ref 0.0–0.1)
Basophils Relative: 0 % (ref 0–1)
Eosinophils Relative: 0 % (ref 0–5)
Lymphs Abs: 1.3 10*3/uL (ref 0.7–4.0)
Monocytes Absolute: 0.6 10*3/uL (ref 0.1–1.0)
Monocytes Absolute: 0.2 10*3/uL (ref 0.1–1.0)
Monocytes Relative: 5 % (ref 3–12)
Monocytes Relative: 2 % — ABNORMAL LOW (ref 3–12)
Neutro Abs: 9.4 10*3/uL — ABNORMAL HIGH (ref 1.7–7.7)
Neutrophils Relative %: 90 % — ABNORMAL HIGH (ref 43–77)

## 2011-03-20 LAB — CBC
HCT: 26 % — ABNORMAL LOW (ref 36.0–46.0)
HCT: 26.7 % — ABNORMAL LOW (ref 36.0–46.0)
HCT: 26.8 % — ABNORMAL LOW (ref 36.0–46.0)
HCT: 27.6 % — ABNORMAL LOW (ref 36.0–46.0)
HCT: 27.7 % — ABNORMAL LOW (ref 36.0–46.0)
HCT: 29.2 % — ABNORMAL LOW (ref 36.0–46.0)
HCT: 32.2 % — ABNORMAL LOW (ref 36.0–46.0)
Hemoglobin: 10 g/dL — ABNORMAL LOW (ref 12.0–15.0)
Hemoglobin: 10.4 g/dL — ABNORMAL LOW (ref 12.0–15.0)
Hemoglobin: 8.6 g/dL — ABNORMAL LOW (ref 12.0–15.0)
Hemoglobin: 8.7 g/dL — ABNORMAL LOW (ref 12.0–15.0)
Hemoglobin: 8.8 g/dL — ABNORMAL LOW (ref 12.0–15.0)
Hemoglobin: 8.9 g/dL — ABNORMAL LOW (ref 12.0–15.0)
Hemoglobin: 9 g/dL — ABNORMAL LOW (ref 12.0–15.0)
Hemoglobin: 9 g/dL — ABNORMAL LOW (ref 12.0–15.0)
Hemoglobin: 9.3 g/dL — ABNORMAL LOW (ref 12.0–15.0)
MCHC: 32.4 g/dL (ref 30.0–36.0)
MCHC: 32.4 g/dL (ref 30.0–36.0)
MCHC: 32.4 g/dL (ref 30.0–36.0)
MCHC: 32.6 g/dL (ref 30.0–36.0)
MCHC: 32.6 g/dL (ref 30.0–36.0)
MCHC: 32.8 g/dL (ref 30.0–36.0)
MCHC: 33 g/dL (ref 30.0–36.0)
MCHC: 34.1 g/dL (ref 30.0–36.0)
MCV: 94.2 fL (ref 78.0–100.0)
MCV: 96.3 fL (ref 78.0–100.0)
MCV: 96.7 fL (ref 78.0–100.0)
MCV: 96.7 fL (ref 78.0–100.0)
MCV: 96.8 fL (ref 78.0–100.0)
MCV: 97.6 fL (ref 78.0–100.0)
Platelets: 329 10*3/uL (ref 150–400)
Platelets: 210 10*3/uL (ref 150–400)
Platelets: 252 10*3/uL (ref 150–400)
Platelets: 263 10*3/uL (ref 150–400)
Platelets: 277 10*3/uL (ref 150–400)
RBC: 2.75 MIL/uL — ABNORMAL LOW (ref 3.87–5.11)
RBC: 2.76 MIL/uL — ABNORMAL LOW (ref 3.87–5.11)
RBC: 2.76 MIL/uL — ABNORMAL LOW (ref 3.87–5.11)
RBC: 2.85 MIL/uL — ABNORMAL LOW (ref 3.87–5.11)
RBC: 2.99 MIL/uL — ABNORMAL LOW (ref 3.87–5.11)
RBC: 3.19 MIL/uL — ABNORMAL LOW (ref 3.87–5.11)
RDW: 19.6 % — ABNORMAL HIGH (ref 11.5–15.5)
RDW: 19.5 % — ABNORMAL HIGH (ref 11.5–15.5)
RDW: 19.6 % — ABNORMAL HIGH (ref 11.5–15.5)
RDW: 19.8 % — ABNORMAL HIGH (ref 11.5–15.5)
RDW: 19.8 % — ABNORMAL HIGH (ref 11.5–15.5)
RDW: 19.9 % — ABNORMAL HIGH (ref 11.5–15.5)
RDW: 19.9 % — ABNORMAL HIGH (ref 11.5–15.5)
WBC: 16.1 10*3/uL — ABNORMAL HIGH (ref 4.0–10.5)
WBC: 8.3 10*3/uL (ref 4.0–10.5)
WBC: 8.5 10*3/uL (ref 4.0–10.5)
WBC: 8.9 10*3/uL (ref 4.0–10.5)

## 2011-03-20 LAB — POCT I-STAT, CHEM 8
BUN: 24 mg/dL — ABNORMAL HIGH (ref 6–23)
Calcium, Ion: 1.13 mmol/L (ref 1.12–1.32)
Chloride: 96 mEq/L (ref 96–112)
Creatinine, Ser: 0.5 mg/dL (ref 0.4–1.2)
Glucose, Bld: 198 mg/dL — ABNORMAL HIGH (ref 70–99)
HCT: 31 % — ABNORMAL LOW (ref 36.0–46.0)
Potassium: 5.2 mEq/L — ABNORMAL HIGH (ref 3.5–5.1)

## 2011-03-20 LAB — CARDIAC PANEL(CRET KIN+CKTOT+MB+TROPI)
CK, MB: 0.8 ng/mL (ref 0.3–4.0)
Relative Index: INVALID (ref 0.0–2.5)
Total CK: 34 U/L (ref 7–177)
Troponin I: 0.02 ng/mL (ref 0.00–0.06)

## 2011-03-20 LAB — URINE MICROSCOPIC-ADD ON

## 2011-03-20 LAB — URINE CULTURE: Colony Count: 3000

## 2011-03-20 LAB — CULTURE, BLOOD (ROUTINE X 2)

## 2011-03-20 LAB — HEMOCCULT GUIAC POC 1CARD (OFFICE)
Fecal Occult Bld: NEGATIVE
Fecal Occult Bld: NEGATIVE

## 2011-03-20 LAB — TYPE AND SCREEN

## 2011-03-20 LAB — GLUCOSE, CAPILLARY
Glucose-Capillary: 163 mg/dL — ABNORMAL HIGH (ref 70–99)
Glucose-Capillary: 203 mg/dL — ABNORMAL HIGH (ref 70–99)

## 2011-03-20 LAB — SEDIMENTATION RATE: Sed Rate: 6 mm/hr (ref 0–22)

## 2011-03-20 LAB — TSH: TSH: 3.233 u[IU]/mL (ref 0.350–4.500)

## 2011-03-20 LAB — PREGNANCY, URINE: Preg Test, Ur: NEGATIVE

## 2011-03-20 LAB — TROPONIN I: Troponin I: 0.03 ng/mL (ref 0.00–0.06)

## 2011-03-21 LAB — BASIC METABOLIC PANEL
BUN: 12 mg/dL (ref 6–23)
BUN: 4 mg/dL — ABNORMAL LOW (ref 6–23)
BUN: 7 mg/dL (ref 6–23)
CO2: 28 mEq/L (ref 19–32)
CO2: 29 mEq/L (ref 19–32)
CO2: 30 mEq/L (ref 19–32)
CO2: 31 mEq/L (ref 19–32)
CO2: 31 mEq/L (ref 19–32)
Calcium: 7.6 mg/dL — ABNORMAL LOW (ref 8.4–10.5)
Calcium: 7.7 mg/dL — ABNORMAL LOW (ref 8.4–10.5)
Calcium: 7.8 mg/dL — ABNORMAL LOW (ref 8.4–10.5)
Calcium: 7.9 mg/dL — ABNORMAL LOW (ref 8.4–10.5)
Calcium: 8.2 mg/dL — ABNORMAL LOW (ref 8.4–10.5)
Calcium: 8.7 mg/dL (ref 8.4–10.5)
Calcium: 9 mg/dL (ref 8.4–10.5)
Chloride: 101 mEq/L (ref 96–112)
Creatinine, Ser: 0.51 mg/dL (ref 0.4–1.2)
Creatinine, Ser: 0.61 mg/dL (ref 0.4–1.2)
Creatinine, Ser: 0.66 mg/dL (ref 0.4–1.2)
Creatinine, Ser: 0.67 mg/dL (ref 0.4–1.2)
Creatinine, Ser: 0.68 mg/dL (ref 0.4–1.2)
Creatinine, Ser: 0.73 mg/dL (ref 0.4–1.2)
GFR calc Af Amer: >60 mL/min (ref >60)
GFR calc Af Amer: >60 mL/min (ref >60)
GFR calc Af Amer: >60 mL/min (ref >60)
GFR calc Af Amer: >60 mL/min (ref >60)
GFR calc Af Amer: >60 mL/min (ref >60)
GFR calc Af Amer: >60 mL/min (ref >60)
GFR calc Af Amer: >60 mL/min (ref >60)
GFR calc non Af Amer: >60 mL/min (ref >60)
GFR calc non Af Amer: >60 mL/min (ref >60)
GFR calc non Af Amer: >60 mL/min (ref >60)
GFR calc non Af Amer: >60 mL/min (ref >60)
GFR calc non Af Amer: >60 mL/min (ref >60)
Glucose, Bld: 148 mg/dL — ABNORMAL HIGH (ref 70–99)
Glucose, Bld: 156 mg/dL — ABNORMAL HIGH (ref 70–99)
Glucose, Bld: 168 mg/dL — ABNORMAL HIGH (ref 70–99)
Glucose, Bld: 187 mg/dL — ABNORMAL HIGH (ref 70–99)
Potassium: 4.2 mEq/L (ref 3.5–5.1)
Sodium: 137 mEq/L (ref 135–145)
Sodium: 137 mEq/L (ref 135–145)
Sodium: 138 mEq/L (ref 135–145)
Sodium: 138 mEq/L (ref 135–145)
Sodium: 139 mEq/L (ref 135–145)

## 2011-03-21 LAB — COMPREHENSIVE METABOLIC PANEL
Albumin: 2.6 g/dL — ABNORMAL LOW (ref 3.5–5.2)
BUN: 8 mg/dL (ref 6–23)
Calcium: 8.5 mg/dL (ref 8.4–10.5)
Creatinine, Ser: 0.62 mg/dL (ref 0.4–1.2)
GFR calc Af Amer: >60 mL/min (ref >60)
Total Bilirubin: 0.4 mg/dL (ref 0.3–1.2)
Total Protein: 5.4 g/dL — ABNORMAL LOW (ref 6.0–8.3)

## 2011-03-21 LAB — CBC
HCT: 28.3 % — ABNORMAL LOW (ref 36.0–46.0)
HCT: 28.9 % — ABNORMAL LOW (ref 36.0–46.0)
HCT: 30.4 % — ABNORMAL LOW (ref 36.0–46.0)
HCT: 30.5 % — ABNORMAL LOW (ref 36.0–46.0)
Hemoglobin: 10.1 g/dL — ABNORMAL LOW (ref 12.0–15.0)
Hemoglobin: 10.7 g/dL — ABNORMAL LOW (ref 12.0–15.0)
Hemoglobin: 8.8 g/dL — ABNORMAL LOW (ref 12.0–15.0)
Hemoglobin: 9.5 g/dL — ABNORMAL LOW (ref 12.0–15.0)
MCHC: 32.6 g/dL (ref 30.0–36.0)
MCHC: 32.8 g/dL (ref 30.0–36.0)
MCHC: 33.1 g/dL (ref 30.0–36.0)
MCHC: 33.3 g/dL (ref 30.0–36.0)
MCV: 91.4 fL (ref 78.0–100.0)
MCV: 91.8 fL (ref 78.0–100.0)
MCV: 92.5 fL (ref 78.0–100.0)
MCV: 92.9 fL (ref 78.0–100.0)
MCV: 93.7 fL (ref 78.0–100.0)
Platelets: 207 10*3/uL (ref 150–400)
Platelets: 232 10*3/uL (ref 150–400)
Platelets: 288 10*3/uL (ref 150–400)
Platelets: 298 10*3/uL (ref 150–400)
RBC: 3.01 MIL/uL — ABNORMAL LOW (ref 3.87–5.11)
RBC: 3.15 MIL/uL — ABNORMAL LOW (ref 3.87–5.11)
RBC: 3.24 MIL/uL — ABNORMAL LOW (ref 3.87–5.11)
RBC: 3.29 MIL/uL — ABNORMAL LOW (ref 3.87–5.11)
RBC: 3.33 MIL/uL — ABNORMAL LOW (ref 3.87–5.11)
RBC: 3.47 MIL/uL — ABNORMAL LOW (ref 3.87–5.11)
RDW: 14.5 % (ref 11.5–15.5)
RDW: 14.8 % (ref 11.5–15.5)
RDW: 14.9 % (ref 11.5–15.5)
RDW: 16.4 % — ABNORMAL HIGH (ref 11.5–15.5)
RDW: 17.5 % — ABNORMAL HIGH (ref 11.5–15.5)
WBC: 11.2 10*3/uL — ABNORMAL HIGH (ref 4.0–10.5)
WBC: 14.8 10*3/uL — ABNORMAL HIGH (ref 4.0–10.5)
WBC: 17.4 10*3/uL — ABNORMAL HIGH (ref 4.0–10.5)
WBC: 18.2 10*3/uL — ABNORMAL HIGH (ref 4.0–10.5)
WBC: 21.5 10*3/uL — ABNORMAL HIGH (ref 4.0–10.5)

## 2011-03-21 LAB — CARDIAC PANEL(CRET KIN+CKTOT+MB+TROPI)
CK, MB: 0.6 ng/mL (ref 0.3–4.0)
Relative Index: INVALID (ref 0.0–2.5)
Relative Index: INVALID (ref 0.0–2.5)
Total CK: 65 U/L (ref 7–177)
Troponin I: 0.04 ng/mL (ref 0.00–0.06)
Troponin I: 0.04 ng/mL (ref 0.00–0.06)

## 2011-03-21 LAB — DIFFERENTIAL
Basophils Absolute: 0 10*3/uL (ref 0.0–0.1)
Eosinophils Relative: 0 % (ref 0–5)
Lymphocytes Relative: 4 % — ABNORMAL LOW (ref 12–46)
Lymphs Abs: 0.7 10*3/uL (ref 0.7–4.0)
Monocytes Absolute: 0.6 10*3/uL (ref 0.1–1.0)

## 2011-03-21 LAB — POCT CARDIAC MARKERS: Troponin i, poc: <0.05 ng/mL (ref 0.00–0.09)

## 2011-03-21 LAB — C4 COMPLEMENT: Complement C4, Body Fluid: 6 mg/dL — ABNORMAL LOW (ref 16–47)

## 2011-03-21 LAB — URINALYSIS, ROUTINE W REFLEX MICROSCOPIC
Bilirubin Urine: NEGATIVE
Specific Gravity, Urine: 1.016 (ref 1.005–1.030)
pH: 7.5 (ref 5.0–8.0)

## 2011-03-21 LAB — URINE CULTURE: Colony Count: 3000

## 2011-03-21 LAB — C3 COMPLEMENT: C3 Complement: 50 mg/dL — ABNORMAL LOW (ref 88–201)

## 2011-03-21 LAB — RAPID URINE DRUG SCREEN, HOSP PERFORMED: Barbiturates: NOT DETECTED

## 2011-03-21 LAB — POCT PREGNANCY, URINE: Preg Test, Ur: NEGATIVE

## 2011-03-21 LAB — CULTURE, BLOOD (ROUTINE X 2)

## 2011-03-21 LAB — D-DIMER, QUANTITATIVE: D-Dimer, Quant: 2.19 ug/mL-FEU — ABNORMAL HIGH (ref 0.00–0.48)

## 2011-03-21 LAB — URINE MICROSCOPIC-ADD ON

## 2011-03-21 LAB — GLUCOSE, CAPILLARY

## 2011-03-25 LAB — CBC
HCT: 32.6 % — ABNORMAL LOW (ref 36.0–46.0)
Hemoglobin: 10.7 g/dL — ABNORMAL LOW (ref 12.0–15.0)
MCHC: 32.8 g/dL (ref 30.0–36.0)
MCV: 92.4 fL (ref 78.0–100.0)
Platelets: 399 10*3/uL (ref 150–400)
RBC: 3.55 MIL/uL — ABNORMAL LOW (ref 3.87–5.11)
RDW: 14 % (ref 11.5–15.5)
WBC: 19.5 10*3/uL — ABNORMAL HIGH (ref 4.0–10.5)

## 2011-03-25 LAB — URINALYSIS, ROUTINE W REFLEX MICROSCOPIC
Bilirubin Urine: NEGATIVE
Glucose, UA: NEGATIVE mg/dL
Ketones, ur: 15 mg/dL — AB
Leukocytes, UA: NEGATIVE
Protein, ur: 100 mg/dL — AB

## 2011-03-25 LAB — DIFFERENTIAL
Basophils Absolute: 0.1 10*3/uL (ref 0.0–0.1)
Basophils Relative: 1 % (ref 0–1)
Monocytes Absolute: 1.2 10*3/uL — ABNORMAL HIGH (ref 0.1–1.0)
Neutro Abs: 15.8 10*3/uL — ABNORMAL HIGH (ref 1.7–7.7)

## 2011-03-25 LAB — CARDIAC PANEL(CRET KIN+CKTOT+MB+TROPI)
CK, MB: 0.9 ng/mL (ref 0.3–4.0)
Relative Index: INVALID (ref 0.0–2.5)
Troponin I: 0.01 ng/mL (ref 0.00–0.06)
Troponin I: 0.02 ng/mL (ref 0.00–0.06)

## 2011-03-25 LAB — COMPREHENSIVE METABOLIC PANEL
ALT: 17 U/L (ref 0–35)
AST: 27 U/L (ref 0–37)
Albumin: 3 g/dL — ABNORMAL LOW (ref 3.5–5.2)
Alkaline Phosphatase: 43 U/L (ref 39–117)
BUN: 11 mg/dL (ref 6–23)
Calcium: 7.7 mg/dL — ABNORMAL LOW (ref 8.4–10.5)
Chloride: 109 mEq/L (ref 96–112)
Creatinine, Ser: 0.81 mg/dL (ref 0.4–1.2)
GFR calc Af Amer: >60 mL/min (ref >60)
Glucose, Bld: 144 mg/dL — ABNORMAL HIGH (ref 70–99)
Glucose, Bld: 162 mg/dL — ABNORMAL HIGH (ref 70–99)
Potassium: 2.7 mEq/L — CL (ref 3.5–5.1)
Sodium: 143 mEq/L (ref 135–145)
Total Bilirubin: 0.9 mg/dL (ref 0.3–1.2)
Total Protein: 5.7 g/dL — ABNORMAL LOW (ref 6.0–8.3)

## 2011-03-25 LAB — STOOL CULTURE

## 2011-03-25 LAB — HEMOCCULT GUIAC POC 1CARD (OFFICE)
Fecal Occult Bld: NEGATIVE
Fecal Occult Bld: POSITIVE

## 2011-03-25 LAB — URINE MICROSCOPIC-ADD ON

## 2011-03-25 LAB — MAGNESIUM
Magnesium: 1.6 mg/dL (ref 1.5–2.5)
Magnesium: 1.7 mg/dL (ref 1.5–2.5)

## 2011-03-25 LAB — HEMOGLOBIN A1C: Hgb A1c MFr Bld: 5.8 % (ref 4.6–6.1)

## 2011-03-25 LAB — OVA AND PARASITE EXAMINATION: Ova and parasites: NONE SEEN

## 2011-04-02 ENCOUNTER — Emergency Department (HOSPITAL_COMMUNITY)
Admission: EM | Admit: 2011-04-02 | Discharge: 2011-04-02 | Disposition: A | Discharge status: Home / Self Care | Payer: Medicaid Other | Attending: Emergency Medicine | Admitting: Emergency Medicine

## 2011-04-02 DIAGNOSIS — N059 Unspecified nephritic syndrome with unspecified morphologic changes: Secondary | ICD-10-CM | POA: Insufficient documentation

## 2011-04-02 DIAGNOSIS — I10 Essential (primary) hypertension: Secondary | ICD-10-CM | POA: Insufficient documentation

## 2011-04-02 DIAGNOSIS — M329 Systemic lupus erythematosus, unspecified: Secondary | ICD-10-CM | POA: Insufficient documentation

## 2011-04-02 DIAGNOSIS — R21 Rash and other nonspecific skin eruption: Secondary | ICD-10-CM | POA: Insufficient documentation

## 2011-05-18 NOTE — H&P (Signed)
NAMEMarland Kitchen  Evelyn, [name]      ACCOUNT NO.:  [id number]   MEDICAL RECORD NO.:  [id number]          PATIENT TYPE:  INP   LOCATION:  9169                          FACILITY:  [facility]   PHYSICIAN:  Janine Limbo, M.D.DATE OF BIRTH:  Aug 12, 1981   DATE OF ADMISSION:  08/01/2006  DATE OF DISCHARGE:                                HISTORY & PHYSICAL   Evelyn [name] is a 30 year old, gravida 1, para 0, who is admitted at 41-1/[redacted]  weeks gestational age with complaints of painful regular uterine  contractions since 9:30 p.m. August 01, 2006.  The patient reports  contractions are possibly every three to seven minutes.  However, she is not  quite sure.  The patient is crying throughout most of the interview.  The  patient denies leakage of fluid or bleeding.  The patient reports that her  fetus has been moving normally.  The patient is scheduled for induction of  labor on Monday, August 05, 2006.  The patient describes her pain as cramping  as well as sharp and shooting.  The patient also complains of increased  pelvic pain and pressure.  The patient denies headache, vision changes,  right upper quadrant pain, and has been normotensive throughout her  pregnancy.  After admission to MAU, the patient's fetus was noted to be  nonreactive.  However, short term variability was present but and no  periodic changes were noted.  Uterine contractions, after initial admission  to MAU, began to become less frequent and the patient had decreased  complaints of pain; however, due to the nonreactive fetal heart rate  tracing, decision was made for admission to birthing suite for augmentation  of labor.   The patient's pregnancy remarkable for:  1. Questionable LMP.  2. 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  The patient has been using Valtrex prophylaxis.  3. Tobacco use.  4. History of migraines.  5. Positive group B strep.   PRENATAL LABS:  Blood type O positive,  antibody screen negative.  Sickle  cell trait negative.  Initial hemoglobin 12.5, hematocrit 37.4, platelets  315,000.  RPR nonreactive.  Rubella titer immune.  Hepatitis B surface  antigen negative.  HIV nonreactive.  Gonorrhea and Chlamydia cultures  negative.  Quad screen negative.  Glucola negative.  Group B strep positive.  Gonorrhea and Chlamydia cultures at 34 weeks negative.   HISTORY OF PRESENT PREGNANCY:  The patient entered care at [redacted] weeks  gestation.  The patient's pregnancy has been followed by the MD service at  [facility].  The patient's [facility] is July 25, 2006 established by  ultrasound at [facility] [place] at 13 weeks and one day.  She  was treated for bacterial vaginosis at her initial OB visit with  metronidazole.  Ultrasound obtained at 19 weeks revealed female infant  consistent with dates, normal anatomy, except visualization of the  appropriate cardiac views. Posterior placenta.  No previa and cervix 3.67 cm  with normal AFI.  Ultrasound was repeated at 22 weeks with complete  visualization of anatomy.  The patient with complaints  of urinary symptoms  at 25 weeks. Culture was sent and the patient was treated with b.i.d. for  seven days.  Repeat gonorrhea and Chlamydia cultures were obtained at 25  weeks secondary to contractions noted on monitor strip but prep at that time  was negative.  Cervix closed and 25%.  The patient was sent to maternity  admissions for further evaluation at that time.  The patient was discharged  home after terbutaline.  Fetal fibronectin obtained at 26 weeks after  initial MAU visit was negative.  The patient continued using terbutaline as  needed for contractions.  Ultrasound repeated at 26 weeks revealed normal  amniotic fluid.  Cervix 3.4 cm, normal heart anatomy and normal growth,  vertex presentation.  Fetal fibronectin was repeated at 27 weeks and six  days and was negative.  The patient also again treated for  bacterial  vaginosis at that time as well with metronidazole.  The patient with  complaints of frequent contractions at 30 weeks.  Fetal fibronectin was  again sent.  Cervix remained long and closed.  At that time the patient had  not completed her treatment for bacterial vaginosis and, therefore, was  changed from metronidazole to Clindesse.  The patient was also given  Flexeril for pelvic muscle spasms.  Fetal fibronectin at that time was  negative as well at 30 weeks.  She was seen in MAU at 32 weeks with similar  complaints at 32 weeks and four days.  The patient was prescribed Valtrex  for HSV prophylaxis.  Urine culture was negative that was obtained earlier  and as well as gonorrhea and Chlamydia cultures.  At 34 weeks, repeat GC and  Chlamydia were obtained as well as GBS and ultrasound. Ultrasound at that  time revealed estimated fetal weight to 20th percentile, normal fluid,  cervix 4.2 cm, posterior placenta.  The patient was scheduled for ultrasound  and underwent ultrasound at 40 weeks.  Prevailing estimated fetal weight 7  pounds 1 ounce at the 27th percentile, normal fluid and vertex and posterior  placenta.  The patient at that time was scheduled for induction of labor  going for August 05, 2006.  The patient has had reactive nonstress tests  between 40 weeks and 41 weeks twice weekly.  The remainder of the pregnancy  has been unremarkable.   OB HISTORY:  Pregnancy #1 is current.   GYN HISTORY:  The patient denies history of abnormal Pap smears.  The  patient has history of HSV since 2006.  The patient with history of  Trichomonas in 1999 when she was treated.  The patient has history of Depo-  Provera use in 2005.  The patient reports irregular menses with menarche at  age 25.  The patient with complaints of cramping and bleeding with her  menses.   PAST MEDICAL HISTORY:  Significant for:  1. History of anemia.  2. History of UTI. 3. Pyelonephritis x1.  4. History of  migraine headaches with no medications.   PAST SURGICAL HISTORY:  Oral extractions, age 30.   CURRENT MEDICATIONS:  1. Valtrex 500 mg daily.  2. Prenatal vitamins.   ALLERGIES:  No known drug allergies.   FAMILY HISTORY:  Mother with hypertension and heart disease, anemia.  Paternal grandmother with cancer of unknown type.  Paternal grandfather  cancer of unknown type.  Maternal grandfather lung cancer.   GENETIC HISTORY:  Father of the baby's brother with autism.  The patient's  cousin with twins; otherwise negative.  SOCIAL HISTORY:  The patient is single.  The patient is unemployed with 13  years of education.  Father of the baby is __________ with 12 years of  education and is unemployed.  The patient's domestic violence screen is  negative.  The patient with history of tobacco use prior to pregnancy.  However, with discontinuation November 2006.  The patient denies alcohol or  street drug use.   PHYSICAL EXAMINATION:   REVIEW OF SYSTEMS:  Negative.   PHYSICAL EXAMINATION:  VITAL SIGNS:  The patient is afebrile.  Vital signs  are stable.  HEENT:  Within normal limits.  LUNGS:  Clear.  HEART:  Regular rate and rhythm.  BREASTS:  Soft.  ABDOMEN:  Soft, gravid, nontender.  Fundal height extending 40 cm above the  symphysis pubis.  Fetus noted to be in longitudinal lie.  Vertex by  Leopold's maneuvers.  Fetal heart rate noted to be in the 140s to 150s with  short-term variability present.  However, no periodic changes are noted.  No  accelerations are noted and present.  Uterine contractions every three to  seven minutes initially. Then every 10 to 15 minutes and mild to palpation.  CERVICAL:  Cervical exam found the cervix to be 3 to 4 cm dilated, 80%  effaced, minus 2 station.  Exam of the vagina revealed no evidence of  lesions and no evidence of lesions noted on external genitalia.  EXTREMITIES:  With no edema noted bilaterally.  Negative Homan's  bilaterally.  Deep  tendon reflexes 2+.  No clubbing.   ASSESSMENT:  1. Intrauterine pregnancy at 41-1/7 weeks.  2. Nonreactive fetal heart rate.  3. Positive group B strep.  4. History of herpes simplex virus.  No evidence of lesions.  5. Prodromal labor.   PLAN:  1. Consult was obtained with Dr. Stefano Gaul.  The patient will be admitted      to birthing suite for continued observation and monitoring and possible      augmentation of labor.  2. Routine MD orders.  3. The patient plans epidural for labor.  4. Penicillin-G for group B strep prophylaxis.      Rhona Leavens, CNM      Janine Limbo, M.D.  Electronically Signed    NOS/MEDQ  D:  08/02/2006  T:  08/02/2006  Job:  [id number]

## 2011-05-21 ENCOUNTER — Emergency Department (HOSPITAL_COMMUNITY)
Admission: EM | Admit: 2011-05-21 | Discharge: 2011-05-21 | Disposition: A | Discharge status: Home / Self Care | Payer: Medicaid Other | Attending: Emergency Medicine | Admitting: Emergency Medicine

## 2011-05-21 DIAGNOSIS — N058 Unspecified nephritic syndrome with other morphologic changes: Secondary | ICD-10-CM | POA: Insufficient documentation

## 2011-05-21 DIAGNOSIS — M329 Systemic lupus erythematosus, unspecified: Secondary | ICD-10-CM | POA: Insufficient documentation

## 2011-05-21 DIAGNOSIS — R112 Nausea with vomiting, unspecified: Secondary | ICD-10-CM | POA: Insufficient documentation

## 2011-05-21 DIAGNOSIS — I10 Essential (primary) hypertension: Secondary | ICD-10-CM | POA: Insufficient documentation

## 2011-05-21 DIAGNOSIS — R197 Diarrhea, unspecified: Secondary | ICD-10-CM | POA: Insufficient documentation

## 2011-05-21 LAB — DIFFERENTIAL
Basophils Relative: 0 % (ref 0–1)
Eosinophils Absolute: 0 10*3/uL (ref 0.0–0.7)
Eosinophils Relative: 0 % (ref 0–5)
Lymphs Abs: 1.5 10*3/uL (ref 0.7–4.0)
Monocytes Absolute: 0.6 10*3/uL (ref 0.1–1.0)
Neutro Abs: 8 10*3/uL — ABNORMAL HIGH (ref 1.7–7.7)
Neutrophils Relative %: 79 % — ABNORMAL HIGH (ref 43–77)

## 2011-05-21 LAB — URINALYSIS, ROUTINE W REFLEX MICROSCOPIC
Bilirubin Urine: NEGATIVE
Leukocytes, UA: NEGATIVE
Nitrite: NEGATIVE
Specific Gravity, Urine: 1.024 (ref 1.005–1.030)
Urobilinogen, UA: 0.2 mg/dL (ref 0.0–1.0)
pH: 8 (ref 5.0–8.0)

## 2011-05-21 LAB — BASIC METABOLIC PANEL
Chloride: 96 mEq/L (ref 96–112)
Creatinine, Ser: 0.71 mg/dL (ref 0.4–1.2)
GFR calc Af Amer: >60 mL/min (ref >60)
GFR calc non Af Amer: >60 mL/min (ref >60)

## 2011-05-21 LAB — CBC
HCT: 36.1 % (ref 36.0–46.0)
MCH: 29.4 pg (ref 26.0–34.0)
MCHC: 31.6 g/dL (ref 30.0–36.0)
MCV: 93 fL (ref 78.0–100.0)
Platelets: 246 10*3/uL (ref 150–400)
RDW: 15 % (ref 11.5–15.5)
WBC: 10.1 10*3/uL (ref 4.0–10.5)

## 2011-05-21 LAB — POCT PREGNANCY, URINE: Preg Test, Ur: NEGATIVE

## 2011-05-21 LAB — URINE MICROSCOPIC-ADD ON

## 2011-05-22 LAB — URINE CULTURE: Culture: NO GROWTH

## 2011-07-03 ENCOUNTER — Other Ambulatory Visit (HOSPITAL_COMMUNITY): Payer: Medicaid Other

## 2011-07-07 NOTE — H&P (Signed)
NAMEJATZIRY, Evelyn [name] NO.:  [id number]  MEDICAL RECORD NO.:  [id number]  LOCATION:  PERIO                         FACILITY:  [facility]  PHYSICIAN:  Osborn Coho, M.D.   DATE OF BIRTH:  [birth year]  DATE OF ADMISSION:  06/18/2011 DATE OF DISCHARGE:                             HISTORY & PHYSICAL   HISTORY OF PRESENT ILLNESS:  Ms. Kiger is a 30 year old single black female para 1-0-2-1, presenting for laparoscopic tubal cautery because of her desire for permanent sterilization.  The patient has a long history of systemic lupus erythematosus which has exacerbated in most recent years and consequently has caused her to have repeated miscarriages.  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  Consequently, the patient has consented to proceed with sterilization by laparoscopy.  PAST MEDICAL HISTORY:  OB History:  Gravida 10, para 1-0-9-1.  The patient had 1 spontaneous vaginal birth in 2007. GYN History:  Menarche 29 years old.  The patient's last menstrual period was in April 2012, which was followed by a miscarriage.  The patient uses condoms for contraception.  She has a history of Chlamydia and herpes simplex virus 2.  She underwent colposcopy in 2011 for an abnormal Pap smear.  Her most recent Pap smear was May 2012, and it showed low-grade squamous intraepithelial lesion.  The patient is scheduled for colposcopy on July 20, 2011.  MEDICAL HISTORY:  Systemic lupus erythematosus, anemia, migraines, panic attacks, lupus nephritis, cardiac effusion, left lower quadrant abdominal shingles, Clostridium difficile, rheumatoid arthritis of the hands, hypertension, chronic pain syndrome, insomnia.  SURGERY HISTORY:  In November 2010, D and E due to a missed abortion.  She denies any history of blood transfusions.  She states that with anesthesia, she  requires more than average because she often will awake from anesthesia.  FAMILY HISTORY:  Glaucoma, hypertension, rheumatoid arthritis, stomach and liver cancers, diabetes mellitus.  SOCIAL HISTORY:  The patient is single and she currently is unemployed.  HABITS:  She does not use tobacco or illicit drugs.  She occasionally consumes alcohol.  CURRENT MEDICATIONS: 1. Prednisone 20 mg daily. 2. Hydroxychloroquine 200 mg daily. 3. Neurontin 300 mg daily. 4. Fentanyl 50 mcg daily. 5. Azathioprine 50 mg daily. 6. Hydroxyzine 50 mg daily. 7. Oxycodone as needed. 8. Promethazine 25 mg every 6 hours as needed. 9. Tylenol as needed.  The patient is allergic to [facility], it causes her throat to swell and exhibit extrapyramidal side effects.  She has been advised to avoid nonsteroidal anti-inflammatory medications and sulfa due to her renal damage.  She denies any sensitivities to peanuts, latex, or shellfish.  REVIEW OF SYSTEMS:  The patient does have blurred vision.  Knee, wrist, and hand joint swelling.  She denies, however, any chest pain, shortness of breath, headache, tinnitus, dysphagia, head congestion, nausea, vomiting, diarrhea, cough, pelvic pain, palpitations, dysuria, hematuria, urinary frequency, and except as is mentioned in history present illness, the patient's review of systems is otherwise negative.  PHYSICAL EXAMINATION:  VITAL SIGNS:  Blood pressure 110/70, pulse is 80, respirations 16, temperature 98.1 degrees Fahrenheit orally,  weight 149 pounds, height 5 feet 4-1/2 inches tall.  Body mass index is 26. NECK:  Supple.  The patient does have thyromegaly (previously evaluated with negative outcome).  There is no cervical adenopathy. HEART:  Regular rate and rhythm. LUNGS:  Clear. BACK:  No CVA tenderness. ABDOMEN:  No tenderness, guarding, rebound, or organomegaly. EXTREMITIES:  No clubbing, cyanosis, or edema. PELVIC:  EG/BUS is normal.  Vagina is normal.  Cervix  is nontender without lesions.  Uterus appears normal size, shape, and consistency without tenderness.  Adnexa without tenderness.  IMPRESSION: 1. Desire for sterilization. 2. Systemic lupus erythematosus.  DISPOSITION:  A discussion was held with the patient regarding indications for her procedure along with its risks which include but are not limited to reaction to anesthesia, damage to adjacent organs, infection, excessive bleeding, and a failure rate of 1 in 500 with laparoscopic tubal cautery.  The patient verbalized understanding of these risk and has consented to proceed with sterilization by laparoscopy at [facility] [place] on July 10, 2011, at 3:30 p.m.  The patient was also given a copy of sterilization by laparoscopy distributed by [employer].     Biran Mayberry J. Lowell Guitar, P.A.-C   ______________________________ Osborn Coho, M.D.    EJP/MEDQ  D:  07/06/2011  T:  07/07/2011  Job:  [id number]

## 2011-07-10 ENCOUNTER — Encounter (HOSPITAL_COMMUNITY): Payer: Self-pay | Admitting: *Deleted

## 2011-07-10 ENCOUNTER — Ambulatory Visit (HOSPITAL_COMMUNITY): Payer: Medicaid Other | Admitting: Anesthesiology

## 2011-07-10 ENCOUNTER — Ambulatory Visit (HOSPITAL_COMMUNITY)
Admission: RE | Admit: 2011-07-10 | Discharge: 2011-07-10 | Disposition: A | Discharge status: Home / Self Care | Payer: Medicaid Other | Source: Ambulatory Visit | Attending: Obstetrics and Gynecology | Admitting: Obstetrics and Gynecology

## 2011-07-10 ENCOUNTER — Encounter (HOSPITAL_COMMUNITY): Payer: Self-pay | Admitting: Anesthesiology

## 2011-07-10 ENCOUNTER — Encounter (HOSPITAL_COMMUNITY)
Admission: RE | Disposition: A | Discharge status: Home / Self Care | Payer: Self-pay | Source: Ambulatory Visit | Attending: Obstetrics and Gynecology

## 2011-07-10 DIAGNOSIS — N83209 Unspecified ovarian cyst, unspecified side: Secondary | ICD-10-CM | POA: Insufficient documentation

## 2011-07-10 DIAGNOSIS — Z302 Encounter for sterilization: Secondary | ICD-10-CM | POA: Insufficient documentation

## 2011-07-10 HISTORY — DX: Headache: R51

## 2011-07-10 HISTORY — DX: Major depressive disorder, single episode, unspecified: F32.9

## 2011-07-10 HISTORY — DX: Anxiety disorder, unspecified: F41.9

## 2011-07-10 HISTORY — PX: LAPAROSCOPIC TUBAL LIGATION: SHX1937

## 2011-07-10 HISTORY — DX: Depression, unspecified: F32.A

## 2011-07-10 HISTORY — DX: Anemia, unspecified: D64.9

## 2011-07-10 LAB — CBC
HCT: 30.4 % — ABNORMAL LOW (ref 36.0–46.0)
Hemoglobin: 9.3 g/dL — ABNORMAL LOW (ref 12.0–15.0)
MCH: 29.7 pg (ref 26.0–34.0)
MCHC: 30.6 g/dL (ref 30.0–36.0)
MCV: 97.1 fL (ref 78.0–100.0)

## 2011-07-10 LAB — HCG, SERUM, QUALITATIVE: Preg, Serum: NEGATIVE

## 2011-07-10 SURGERY — LIGATION, FALLOPIAN TUBE, LAPAROSCOPIC
Anesthesia: General | Laterality: Bilateral | Wound class: Clean Contaminated

## 2011-07-10 MED ORDER — NEOSTIGMINE METHYLSULFATE 1 MG/ML IJ SOLN
INTRAMUSCULAR | Status: DC | PRN
  Administered 2011-07-10: 3 mg via INTRAMUSCULAR

## 2011-07-10 MED ORDER — DEXAMETHASONE SODIUM PHOSPHATE 10 MG/ML IJ SOLN
INTRAMUSCULAR | Status: AC
  Filled 2011-07-10: qty 1

## 2011-07-10 MED ORDER — LIDOCAINE HCL (CARDIAC) 20 MG/ML IV SOLN
INTRAVENOUS | Status: AC
  Filled 2011-07-10: qty 5

## 2011-07-10 MED ORDER — HYDROMORPHONE HCL 1 MG/ML IJ SOLN
INTRAMUSCULAR | Status: DC | PRN
Start: 2011-07-10 — End: 2011-07-11
  Administered 2011-07-10: 1 mg via INTRAVENOUS

## 2011-07-10 MED ORDER — MUPIROCIN 2 % EX OINT
TOPICAL_OINTMENT | CUTANEOUS | Status: AC
  Filled 2011-07-10: qty 22

## 2011-07-10 MED ORDER — FENTANYL CITRATE 0.05 MG/ML IJ SOLN
INTRAMUSCULAR | Status: DC | PRN
  Administered 2011-07-10: 100 ug via INTRAVENOUS
  Administered 2011-07-10: 50 ug via INTRAVENOUS
  Administered 2011-07-10: 100 ug via INTRAVENOUS

## 2011-07-10 MED ORDER — LACTATED RINGERS IV SOLN
INTRAVENOUS | Status: DC
  Administered 2011-07-10: 14:00:00 via INTRAVENOUS

## 2011-07-10 MED ORDER — LIDOCAINE HCL (CARDIAC) 10 MG/ML IV SOLN
INTRAVENOUS | Status: DC | PRN
  Administered 2011-07-10: 80 mg via INTRAVENOUS

## 2011-07-10 MED ORDER — ONDANSETRON HCL 4 MG/2ML IJ SOLN
INTRAMUSCULAR | Status: DC | PRN
  Administered 2011-07-10: 4 mg via INTRAVENOUS

## 2011-07-10 MED ORDER — GLYCOPYRROLATE 0.2 MG/ML IJ SOLN
INTRAMUSCULAR | Status: DC | PRN
  Administered 2011-07-10: .4 mg via INTRAVENOUS

## 2011-07-10 MED ORDER — BUPIVACAINE HCL (PF) 0.25 % IJ SOLN
INTRAMUSCULAR | Status: DC | PRN
  Administered 2011-07-10: 10 mL

## 2011-07-10 MED ORDER — ONDANSETRON HCL 4 MG/2ML IJ SOLN
INTRAMUSCULAR | Status: AC
  Filled 2011-07-10: qty 2

## 2011-07-10 MED ORDER — MIDAZOLAM HCL 2 MG/2ML IJ SOLN
INTRAMUSCULAR | Status: AC
  Filled 2011-07-10: qty 2

## 2011-07-10 MED ORDER — MIDAZOLAM HCL 5 MG/5ML IJ SOLN
INTRAMUSCULAR | Status: DC | PRN
  Administered 2011-07-10: 2 mg via INTRAVENOUS

## 2011-07-10 MED ORDER — ACETAMINOPHEN 325 MG PO TABS: 325.0000 mg | ORAL_TABLET | ORAL | Status: DC | PRN

## 2011-07-10 MED ORDER — HYDROMORPHONE HCL 1 MG/ML IJ SOLN
0.2500 mg | INTRAMUSCULAR | Status: DC | PRN
  Administered 2011-07-10: 0.5 mg via INTRAVENOUS

## 2011-07-10 MED ORDER — ONDANSETRON HCL 4 MG/2ML IJ SOLN: 4.0000 mg | Freq: Once | INTRAMUSCULAR | Status: DC | PRN

## 2011-07-10 MED ORDER — MEPERIDINE HCL 25 MG/ML IJ SOLN: 6.2500 mg | INTRAMUSCULAR | Status: DC | PRN

## 2011-07-10 MED ORDER — ROCURONIUM BROMIDE 100 MG/10ML IV SOLN
INTRAVENOUS | Status: DC | PRN
  Administered 2011-07-10: 30 mg via INTRAVENOUS

## 2011-07-10 MED ORDER — LACTATED RINGERS IV SOLN
INTRAVENOUS | Status: DC | PRN
  Administered 2011-07-10 (×2): via INTRAVENOUS

## 2011-07-10 MED ORDER — PROPOFOL 10 MG/ML IV EMUL
INTRAVENOUS | Status: DC | PRN
  Administered 2011-07-10: 180 mg via INTRAVENOUS

## 2011-07-10 MED ORDER — ROCURONIUM BROMIDE 50 MG/5ML IV SOLN
INTRAVENOUS | Status: AC
  Filled 2011-07-10: qty 1

## 2011-07-10 MED ORDER — FENTANYL CITRATE 0.05 MG/ML IJ SOLN
INTRAMUSCULAR | Status: AC
  Filled 2011-07-10: qty 5

## 2011-07-10 MED ORDER — PROPOFOL 10 MG/ML IV EMUL
INTRAVENOUS | Status: AC
  Filled 2011-07-10: qty 50

## 2011-07-10 MED ORDER — METHYLPREDNISOLONE SODIUM SUCC 125 MG IJ SOLR
125.0000 mg | Freq: Once | INTRAMUSCULAR | Status: AC
  Administered 2011-07-10: 125 mg via INTRAVENOUS
  Filled 2011-07-10: qty 2

## 2011-07-10 MED ORDER — METHYLPREDNISOLONE SODIUM SUCC 500 MG IJ SOLR: 125.0000 mg | Freq: Once | INTRAMUSCULAR | Status: DC

## 2011-07-10 SURGICAL SUPPLY — 17 items
CATH ROBINSON RED A/P 16FR (CATHETERS) ×2 IMPLANT
CHLORAPREP W/TINT 26ML (MISCELLANEOUS) ×2 IMPLANT
CLOTH BEACON ORANGE TIMEOUT ST (SAFETY) ×2 IMPLANT
DERMABOND ADVANCED (GAUZE/BANDAGES/DRESSINGS) ×2 IMPLANT
GLOVE BIO SURGEON STRL SZ7.5 (GLOVE) ×4 IMPLANT
GLOVE BIOGEL PI IND STRL 7.5 (GLOVE) ×1 IMPLANT
GLOVE BIOGEL PI INDICATOR 7.5 (GLOVE) ×1
GOWN BRE IMP SLV AUR LG STRL (GOWN DISPOSABLE) ×2 IMPLANT
NEEDLE HYPO 25X1 1.5 SAFETY (NEEDLE) ×2 IMPLANT
NEEDLE INSUFFLATION 14GA 120MM (NEEDLE) ×2 IMPLANT
PACK LAPAROSCOPY BASIN (CUSTOM PROCEDURE TRAY) ×2 IMPLANT
SUT MON AB 4-0 PS1 27 (SUTURE) ×2 IMPLANT
SUT VICRYL 0 UR6 27IN ABS (SUTURE) ×2 IMPLANT
TOWEL OR 17X24 6PK STRL BLUE (TOWEL DISPOSABLE) ×4 IMPLANT
TROCAR Z-THREAD FIOS 11X100 BL (TROCAR) ×2 IMPLANT
WARMER LAPAROSCOPE (MISCELLANEOUS) ×2 IMPLANT
WATER STERILE IRR 1000ML POUR (IV SOLUTION) ×2 IMPLANT
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
## 2011-07-10 NOTE — Transfer of Care (Signed)
Immediate Anesthesia Transfer of Care Note  Patient: Evelyn [name]  Procedure(s) Performed:  LAPAROSCOPIC TUBAL LIGATION - fulguration  Patient Location: PACU  Anesthesia Type: General  Level of Consciousness: awake, alert  and oriented  Airway & Oxygen Therapy: Patient Spontanous Breathing and Patient connected to nasal cannula oxygen  Post-op Assessment: Report given to PACU RN  Post vital signs: stable  Complications: No apparent anesthesia complications

## 2011-07-10 NOTE — Anesthesia Procedure Notes (Signed)
Procedures

## 2011-07-10 NOTE — H&P (Signed)
No change in H&P except also allergic to Ciprofloxacin.

## 2011-07-10 NOTE — Transfer of Care (Signed)
Immediate Anesthesia Transfer of Care Note  Patient: Evelyn [name]  Procedure(s) Performed:  LAPAROSCOPIC TUBAL LIGATION - fulguration  Patient Location: PACU  Anesthesia Type: General  Level of Consciousness: awake, alert  and oriented  Airway & Oxygen Therapy: Patient Spontanous Breathing and Patient connected to nasal cannula oxygen  Post-op Assessment: Report given to PACU RN  Post vital signs: stable  Complications: No apparent anesthesia complications 

## 2011-07-10 NOTE — Anesthesia Preprocedure Evaluation (Addendum)
Anesthesia Evaluation  Name, MR# and DOB Patient awake  General Assessment Comment  Reviewed: Allergy & Precautions, H&P  and Patient's Chart, lab work & pertinent test results  History of Anesthesia Complications (+) AWARENESS UNDER ANESTHESIA  Airway Mallampati: I TM Distance: >3 FB Neck ROM: full    Dental   Pulmonaryneg pulmonary ROS  COPD    pulmonary exam normal   Cardiovascular regular Normal   Neuro/PsychNegative Neurological ROS   GI/Hepatic/Renal negative GI ROS, negative Liver ROS, and negative Renal ROS (+)       Endo/Other  Negative Endocrine ROS (+)   Abdominal   Musculoskeletal negative musculoskeletal ROS (+)  Hematology negative hematology ROS (+)   Peds negative pediatric ROS (+)  Reproductive/Obstetrics negative OB ROS          Anesthesia Physical Anesthesia Plan  ASA: II  Anesthesia Plan: General   Post-op Pain Management:    Induction:   Airway Management Planned:   Additional Equipment:   Intra-op Plan:   Post-operative Plan:   Informed Consent: I have reviewed the patients History and Physical, chart, labs and discussed the procedure including the risks, benefits and alternatives for the proposed anesthesia with the patient or authorized representative who has indicated his/her understanding and acceptance.   Dental Advisory Given  Plan Discussed with: Anesthesiologist and CRNA  Anesthesia Plan Comments:         Anesthesia Quick Evaluation

## 2011-07-10 NOTE — Anesthesia Postprocedure Evaluation (Signed)
Vital signs stable Patient alert Pain and nausea are controlled No apparent anesthetic complications No follow up care needed 

## 2011-07-10 NOTE — Transfer of Care (Signed)
Immediate Anesthesia Transfer of Care Note  Patient: Evelyn [name]  Procedure(s) Performed:  LAPAROSCOPIC TUBAL LIGATION - fulguration  Patient Location: PACU  Anesthesia Type: General  Level of Consciousness: awake  Airway & Oxygen Therapy: Patient Spontanous Breathing and Patient connected to nasal cannula oxygen  Post-op Assessment: Report given to PACU RN  Post vital signs: stable  Complications: No apparent anesthesia complications

## 2011-07-31 ENCOUNTER — Encounter (HOSPITAL_COMMUNITY): Payer: Self-pay | Admitting: Obstetrics and Gynecology

## 2011-08-15 ENCOUNTER — Inpatient Hospital Stay (HOSPITAL_COMMUNITY): Payer: Medicaid Other

## 2011-08-15 ENCOUNTER — Inpatient Hospital Stay (HOSPITAL_COMMUNITY)
Admission: EM | Admit: 2011-08-15 | Discharge: 2011-08-20 | DRG: 392 | Disposition: A | Discharge status: Home / Self Care | Payer: Medicaid Other | Attending: Internal Medicine | Admitting: Internal Medicine

## 2011-08-15 ENCOUNTER — Emergency Department (HOSPITAL_COMMUNITY): Payer: Medicaid Other

## 2011-08-15 DIAGNOSIS — F1210 Cannabis abuse, uncomplicated: Secondary | ICD-10-CM | POA: Diagnosis present

## 2011-08-15 DIAGNOSIS — R112 Nausea with vomiting, unspecified: Secondary | ICD-10-CM | POA: Diagnosis present

## 2011-08-15 DIAGNOSIS — K5289 Other specified noninfective gastroenteritis and colitis: Principal | ICD-10-CM | POA: Diagnosis present

## 2011-08-15 DIAGNOSIS — R197 Diarrhea, unspecified: Secondary | ICD-10-CM | POA: Diagnosis present

## 2011-08-15 DIAGNOSIS — I10 Essential (primary) hypertension: Secondary | ICD-10-CM | POA: Diagnosis present

## 2011-08-15 DIAGNOSIS — M329 Systemic lupus erythematosus, unspecified: Secondary | ICD-10-CM | POA: Diagnosis present

## 2011-08-15 DIAGNOSIS — N390 Urinary tract infection, site not specified: Secondary | ICD-10-CM | POA: Diagnosis present

## 2011-08-15 DIAGNOSIS — D72829 Elevated white blood cell count, unspecified: Secondary | ICD-10-CM | POA: Diagnosis present

## 2011-08-15 LAB — COMPREHENSIVE METABOLIC PANEL
ALT: 18 U/L (ref 0–35)
Alkaline Phosphatase: 57 U/L (ref 39–117)
CO2: 22 mEq/L (ref 19–32)
Chloride: 104 mEq/L (ref 96–112)
GFR calc Af Amer: >60 mL/min (ref >60)
Glucose, Bld: 167 mg/dL — ABNORMAL HIGH (ref 70–99)
Potassium: 3 mEq/L — ABNORMAL LOW (ref 3.5–5.1)
Sodium: 142 mEq/L (ref 135–145)
Total Bilirubin: 0.3 mg/dL (ref 0.3–1.2)
Total Protein: 8.7 g/dL — ABNORMAL HIGH (ref 6.0–8.3)

## 2011-08-15 LAB — URINE MICROSCOPIC-ADD ON

## 2011-08-15 LAB — URINALYSIS, ROUTINE W REFLEX MICROSCOPIC
Glucose, UA: NEGATIVE mg/dL
Ketones, ur: NEGATIVE mg/dL
Leukocytes, UA: NEGATIVE
Nitrite: NEGATIVE
Protein, ur: 100 mg/dL — AB

## 2011-08-15 LAB — DIFFERENTIAL
Lymphs Abs: 2.1 10*3/uL (ref 0.7–4.0)
Monocytes Absolute: 1 10*3/uL (ref 0.1–1.0)
Monocytes Relative: 6 % (ref 3–12)
Neutro Abs: 12.7 10*3/uL — ABNORMAL HIGH (ref 1.7–7.7)
Neutrophils Relative %: 80 % — ABNORMAL HIGH (ref 43–77)

## 2011-08-15 LAB — CBC
HCT: 35.3 % — ABNORMAL LOW (ref 36.0–46.0)
Hemoglobin: 11.6 g/dL — ABNORMAL LOW (ref 12.0–15.0)
MCHC: 32.9 g/dL (ref 30.0–36.0)
RBC: 3.8 MIL/uL — ABNORMAL LOW (ref 3.87–5.11)
WBC: 15.8 10*3/uL — ABNORMAL HIGH (ref 4.0–10.5)

## 2011-08-15 LAB — RAPID URINE DRUG SCREEN, HOSP PERFORMED
Amphetamines: NOT DETECTED
Opiates: NOT DETECTED

## 2011-08-15 LAB — SEDIMENTATION RATE: Sed Rate: 60 mm/hr — ABNORMAL HIGH (ref 0–22)

## 2011-08-15 LAB — POCT PREGNANCY, URINE: Preg Test, Ur: NEGATIVE

## 2011-08-15 MED ORDER — IOHEXOL 300 MG/ML  SOLN
100.0000 mL | Freq: Once | INTRAMUSCULAR | Status: AC | PRN
  Administered 2011-08-15: 100 mL via INTRAVENOUS

## 2011-08-16 LAB — BASIC METABOLIC PANEL
BUN: 9 mg/dL (ref 6–23)
CO2: 22 mEq/L (ref 19–32)
Calcium: 8.5 mg/dL (ref 8.4–10.5)
Creatinine, Ser: 0.5 mg/dL (ref 0.50–1.10)
Glucose, Bld: 116 mg/dL — ABNORMAL HIGH (ref 70–99)

## 2011-08-16 LAB — CBC
HCT: 30.5 % — ABNORMAL LOW (ref 36.0–46.0)
MCH: 30.5 pg (ref 26.0–34.0)
MCV: 93.8 fL (ref 78.0–100.0)
Platelets: 299 10*3/uL (ref 150–400)
RBC: 3.25 MIL/uL — ABNORMAL LOW (ref 3.87–5.11)

## 2011-08-16 NOTE — H&P (Signed)
NAMECHRISTABELL, Evelyn [name]           ACCOUNT NO.:  [id number]  MEDICAL RECORD NO.:  [id number]  LOCATION:  1505                         FACILITY:  [facility]  PHYSICIAN:  Hartley Barefoot, MD    DATE OF BIRTH:  08/26/1981  DATE OF ADMISSION:  08/15/2011 DATE OF DISCHARGE:                             HISTORY & PHYSICAL   CHIEF COMPLAINT:  Diarrhea and vomiting.  HISTORY OF PRESENT ILLNESS:  This is a 29 year old with past medical history of lupus, lupus nephritis, anemia, migraine, and a prior history of C. diff, who presents to the emergency department complaining of nausea, vomiting, and diarrhea.  The patient relates that she started to have abdominal pain that started the day of admission accompanied with diarrhea, she had eight bowel movements, watery, no blood on it.  She also started to vomit multiple times, no blood on it.  She denies any fever.  She denies any dysuria, vaginal discharge, or back pain.  Denies chest pain, shortness of breath.  She relates some occasional coughing here in the emergency department.  She also relates that her abdominal pain is in lower quadrant, 9/10 in intensity.  ALLERGIES:  She has allergy to, 1. SULFA. 2. REGLAN. 3. ASPIRIN. 4. IBUPROFEN. 5. CIPROFLOXACIN.  PAST MEDICAL HISTORY: 1. History of lupus. 2. History of pelvic inflammatory disease. 3. History of a C. diff. 4. History of depression. 5. History of hypertension. 6. History of anemia of chronic disease. 7. History of migraine. 8. History of herpes zoster. 9. History of lupus nephritis.  MEDICATIONS: 1. Prednisone. 2. Hydroxychloroquine. 3. Neurontin 200 mg daily. 4. Azathioprine 50 mg daily. 5. Hydroxyzine 50 mg daily. 6. Promethazine 25 mg every 6 hours as needed.  SOCIAL HISTORY:  She is unemployed.  She denies alcohol or recreational drugs.  She is a chronic smoker.  She smokes 3-4 cigarettes per week. She is unemployed.  She has one daughter of 34-year-old.  FAMILY  HISTORY:  Mother had history of hypertension.  REVIEW OF SYSTEMS:  Negative, rest as per HPI.  PHYSICAL EXAMINATION:  VITALS:  Temperature 97.6, pulse 80, blood pressure 121/89, respiration 19, sat 100 on room air. GENERAL:  The patient is lying in bed, in no acute distress, sleepy, but arousable. HEENT:  Head traumatic, normocephalic.  Eyes:  Anicteric.  Pulses equal and reactive to light.  Ocular muscles intact. CARDIOVASCULAR:  S1-S2 regular rhythm and rate.  No rubs, murmurs or gallops. LUNGS:  Bilateral good air movement.  No wheezing, crackles, or rhonchi. ABDOMEN:  Bowel sounds, nondistended.  No guarding.  No rigidity.  Mild tenderness in the in the subumbilical area. EXTREMITY:  No edema and no cyanosis. NEUROLOGIC:  The patient became more awake, oriented x3.  Cranial Nerves: II through XII intact.  Sensation grossly intact.  Motor strength 5/5 throughout.    Next admission labs, white blood cell 15.8, hemoglobin 11.6, platelets 381, and hematocrit 35.3.  Sodium 142, potassium 3.8, chloride 104, bicarb 22, BUN 11, creatinine 0.73, and glucose 167.  ANC 12, lipase 20.  Urine pregnancy negative.  AST 21, albumin 4.3, calcium 10.8.  ASSESSMENT AND PLAN: 1. Abdominal pain, nausea, vomiting, and diarrhea.  This could be     secondary  to gastroenteritis, viral versus bacterial.  Her history     of lupus flare is highly in the differential.  Also mesentery     vasculitis.  The CT abdomen was negative.  I will check lactic     acid.  I will also check double-strand DNA, ESR.  She stated that     she had a prior history of Clostridium difficile.  I will start     empirically Flagyl.  Clostridium difficile polymerase chain     reaction already ordered by emergency department physician.  I will     start Solu-Medrol IV 40 mg b.i.d.  I will continue with her lupus     medication as she is able to tolerate it. 2. Leukocytosis.  We will do a workup for infections, blood cultures,      urine cultures.  Her urinalysis have many bacteria.  We will start     ceftriaxone empirically.  I will check a chest x-ray.  If urine     culture is negative, we can discontinued ceftriaxone. 3. Lupus.  We will continue with her oral medication if she is able to     tolerate it.  We will start IV Solu-Medrol.  We will check double-     strand DNA. I will ask Dr.Truslow for consultation.     Hartley Barefoot, MD     BR/MEDQ  D:  08/15/2011  T:  08/15/2011  Job:  [id number]  Electronically Signed by Hartley Barefoot MD on 08/16/2011 12:18:47 PM

## 2011-08-16 NOTE — Consult Note (Signed)
[name], [name]           ACCOUNT NO.:  [id number]  MEDICAL RECORD NO.:  [id number]  LOCATION:  1505                         FACILITY:  [facility]  PHYSICIAN:  Aundra Dubin, M.D.DATE OF BIRTH:  10/08/1981  DATE OF CONSULTATION:  08/15/2011 DATE OF DISCHARGE:                                CONSULTATION   CHIEF COMPLAINT:  Abdominal pain with N/V/D-lupus.  HISTORY OF PRESENT ILLNESS:  Evelyn [name] is a 30 year old black female whom I have seen on one occasion on May 10, 2011.  Evelyn [name] has a diagnosis of lupus and was treated with CellCept and Cytoxan for glomerular nephritis.  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  Evelyn [name] was treated with CellCept for a period of time and had nausea and vomiting and was switched to Cytoxan.  Evelyn [name] was and initially treated with Cytoxan for a period of time but then was switched to CellCept but this was stopped because of nausea and vomiting.  Evelyn [name] woke up this morning around 6 o'clock with a great deal of abdominal pain, nausea, vomiting, and diarrhea.  The friend with her states Evelyn [name] has thrown up numerous times but at this point has been dry heaves.  It is hurting a great deal.  Evelyn [name] also sprained her ankle approximately 4 days ago and this hurts.  No other joint pains are bothering her.  Evelyn [name] has no oral ulcers, pleuritic chest pain, or Raynaud.  Evelyn [name] reports that Evelyn [name] is followed up with Dr. Concepcion Elk in her pain clinic medicine.  When I saw her on May 10, 2011, I noted that her medicines were somewhat chaotic with a great deal of varying dates as to when Evelyn [name] picked up the medicines.  Evelyn [name] tells me today that Evelyn [name] is on prednisone 35 mg a day.  REVIEW OF SYSTEMS:  On further review of systems, Evelyn [name] does not think Evelyn [name] has lost weight.  Evelyn [name] is denying fever, active rashes.  Evelyn [name] has no headaches.  Evelyn [name] has some fever and chills.  Evelyn [name] is quite cold at  this time.  No visual disturbance or photosensitive rashes.  PAST MEDICAL HISTORY:  Significant for lupus.  In April, Evelyn [name] had an ANA of 1:160 homogeneous.  Labs that I ordered on May 10, 2011, showed RNP greater than 8.  Smith AB 3.1, (both 0- 0.9).  The SSA was 1.4, SSB negative.  Creatinine 0.7, albumin 4.0, AST 19.  UA with 2+ proteinuria and no RBCs.  Hemoglobin 10.4.  WBC 7.9, platelets 303, anti-DNA 37 (positive greater than 9) C4- 14 (9-36), C3 67 (90 - 180).  ESR 32.  Of note, Evelyn [name] was in the emergency room in mid May for N/V/D, and was discharged to home.  Evelyn [name] tells me that Evelyn [name] has had other episodes like this before.  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  Evelyn [name] was hospitalized at [facility] for a period of time with C difficile.  History of anxiety.  Need for pain syndrome HTN.  MEDICATIONS:  Very unclear supposedly prednisone 35 mg daily, gabapentin 300 mg t.i.d., hydroxyzine, fentanyl patch, Zofran p.r.n., azathioprine 150 mg daily, lisinopril 2.5 mg  daily, HCQ 400 mg daily, Celexa 25 mg daily, oxycodone.  DRUG INTOLERANCE:  SULFA, NSAIDS, and REGLAN.  FAMILY HISTORY:  Mother is 80 years of age, and is following for carpal tunnel and HTN.  Evelyn [name] has a great aunt with RA.  Her father 11 and is reported as being in good health.  SOCIAL HISTORY:  Evelyn [name] is originally from [place] and lived in [place] for about 10 years.  Evelyn [name] moved to [facility] in the last year to be closer to her mother and stepfather.  Evelyn [name] has a 53-year-old daughter. Evelyn [name] drinks a glass of wine a couple of times in a month.  Evelyn [name] smokes few cigarettes.  Evelyn [name] completed 13 years of education.  Evelyn [name] has had a abdominal and pelvic CT daily, showing a left ovarian cyst 2.9 cm and no acute inflammatory changes.  No hydronephrosis.  Labs today showed WBC 15.8, HGB 11.6, PLT 381, potassium 3, glucose 167. Sodium 142, creatinine 0.7, AST of 27, ALT of 18, albumin 4.3, lipase 20.  UA with 100 proteinuria, 0-2 wbc, 0-2 rbc  with many squamous epithelial cells.  HISTORY AND PHYSICAL:  VITAL SIGNS:  Pulse 80, temperature 97.6, blood pressure 121/89, respirations 16. GENERAL:  Evelyn [name] appears slightly uncomfortable and is lying with a towel overhead because Evelyn [name] is cold.  Evelyn [name] is covered with blankets to the skin, to her face, arms, legs and abdomen showed no active lesions. EYES:  PERRL/EOMI. MOUTH:  Clear. NECK:  Negative JVD. LUNGS:  Clear. HEART:  Regular. ABDOMEN:  Soft with mild tenderness. MUSCULOSKELETAL:  The hands, wrists, elbows, shoulders showed no active arthritis and had FROM.  The left ankle and feet are not swollen and nontender.  The right ankle is swollen consistent with a sprain and was quite tender. NEURO:  Not examined.  ASSESSMENT/PLAN: 1. Lupus patient with nausea, vomiting, diarrhea.  I agree that Evelyn [name] is     treated with antibiotics.  Her WBC is elevated, I think also     placing on Solu-Medrol is reasonable.  I placed her on Solu-Medrol     40 mg IV q.8 h.  We will reduce this in 1 to 2 days, hopefully Evelyn [name]     has improved.  Pancreatitis is in the differential for her     abdominal pain but with the lipase being negative and the abdomen     being overall fairly benign this is less likely.  Also the CT scan     did not show activity of the pancreas. 2. History of glomerulonephritis.  This appears be acquired problem.     Her albumin is excellent along with showing only a slight degree of     proteinuria and no activity with RBCs. 3. Need for pain center attention. 4. Sprained right ankle.          ______________________________ Aundra Dubin, M.D.     WWT/MEDQ  D:  08/15/2011  T:  08/16/2011  Job:  [id number]

## 2011-08-17 LAB — CBC
HCT: 29.6 % — ABNORMAL LOW (ref 36.0–46.0)
MCH: 30.4 pg (ref 26.0–34.0)
MCHC: 32.4 g/dL (ref 30.0–36.0)
MCV: 93.7 fL (ref 78.0–100.0)
Platelets: 308 10*3/uL (ref 150–400)
RDW: 14 % (ref 11.5–15.5)

## 2011-08-17 LAB — BASIC METABOLIC PANEL
BUN: 7 mg/dL (ref 6–23)
Calcium: 8.5 mg/dL (ref 8.4–10.5)
Creatinine, Ser: 0.61 mg/dL (ref 0.50–1.10)
GFR calc Af Amer: >60 mL/min (ref >60)

## 2011-08-17 LAB — URINE CULTURE

## 2011-08-18 LAB — CLOSTRIDIUM DIFFICILE BY PCR: Toxigenic C. Difficile by PCR: NEGATIVE

## 2011-08-19 ENCOUNTER — Inpatient Hospital Stay (HOSPITAL_COMMUNITY): Payer: Medicaid Other

## 2011-08-19 LAB — BASIC METABOLIC PANEL
CO2: 27 mEq/L (ref 19–32)
Chloride: 104 mEq/L (ref 96–112)
Creatinine, Ser: 0.56 mg/dL (ref 0.50–1.10)
GFR calc Af Amer: >60 mL/min (ref >60)
Potassium: 3.3 mEq/L — ABNORMAL LOW (ref 3.5–5.1)

## 2011-08-19 LAB — TSH: TSH: 0.531 u[IU]/mL (ref 0.350–4.500)

## 2011-08-19 LAB — CBC
HCT: 32.5 % — ABNORMAL LOW (ref 36.0–46.0)
MCV: 91.8 fL (ref 78.0–100.0)
Platelets: 312 10*3/uL (ref 150–400)
RBC: 3.54 MIL/uL — ABNORMAL LOW (ref 3.87–5.11)
WBC: 9.1 10*3/uL (ref 4.0–10.5)

## 2011-08-19 LAB — T3, FREE: T3, Free: 2.1 pg/mL — ABNORMAL LOW (ref 2.3–4.2)

## 2011-08-20 LAB — CBC
MCV: 92.9 fL (ref 78.0–100.0)
Platelets: 358 10*3/uL (ref 150–400)
RBC: 3.64 MIL/uL — ABNORMAL LOW (ref 3.87–5.11)
RDW: 13.9 % (ref 11.5–15.5)
WBC: 10.5 10*3/uL (ref 4.0–10.5)

## 2011-08-20 LAB — DIFFERENTIAL
Basophils Absolute: 0 10*3/uL (ref 0.0–0.1)
Basophils Relative: 0 % (ref 0–1)
Eosinophils Absolute: 0 10*3/uL (ref 0.0–0.7)
Lymphocytes Relative: 25 % (ref 12–46)
Monocytes Absolute: 1.3 10*3/uL — ABNORMAL HIGH (ref 0.1–1.0)
Neutrophils Relative %: 63 % (ref 43–77)

## 2011-08-20 LAB — BASIC METABOLIC PANEL
CO2: 23 mEq/L (ref 19–32)
Chloride: 105 mEq/L (ref 96–112)
Creatinine, Ser: 0.6 mg/dL (ref 0.50–1.10)
GFR calc Af Amer: >60 mL/min (ref >60)
Potassium: 3.8 mEq/L (ref 3.5–5.1)
Sodium: 138 mEq/L (ref 135–145)

## 2011-08-20 LAB — OVA AND PARASITE EXAMINATION: Ova and parasites: NONE SEEN

## 2011-08-21 LAB — CULTURE, BLOOD (ROUTINE X 2)
Culture  Setup Time: 201208152336
Culture: NO GROWTH

## 2011-09-27 NOTE — Discharge Summary (Signed)
  Evelyn [name], [name]           ACCOUNT NO.:  [id number]  MEDICAL RECORD NO.:  [id number]  LOCATION:  1505                         FACILITY:  [facility]  PHYSICIAN:  Hartley Barefoot, MD    DATE OF BIRTH:  Oct 24, 1981  DATE OF ADMISSION:  08/15/2011 DATE OF DISCHARGE:  08/20/2011                              DISCHARGE SUMMARY   DISCHARGE DIAGNOSES: 1. Probably lupus flare. 2. Urinary tract infection. 3. Anemia.  PAST MEDICAL HISTORY: 1. History of depression. 2. Prior history of C difficile. 3. History of herpes zoster. 4. History of lupus nephritis.  DISCHARGE MEDICATIONS: 1. Metronidazole 500 mg every 8 hours. 2. Prednisone 20 mg 3 tablets for 3 days, then 2 tablets for 3 days,     then 1 tablet daily. 3. Ambien 10 mg p.o. daily at bedtime. 4. Azathioprine 50 mg 3 tablets by mouth daily. 5. Fentanyl patch, 1 patch every 3 days. 6. Gabapentin 300 mg 1 tablet daily at bedtime. 7. Hydroxyzine 25 mg p.o. daily. 8. Plaquenil 200 mg b.i.d. 9. Pristiq 50 mg 1 tablet by mouth daily. 10.Vicodin 5/500, 1-2 tablets by mouth every 6 hours as needed. 11.Zofran 4 mg 1 teaspoon under the tongue every 6 hours as needed.  BRIEF HISTORY OF PRESENT ILLNESS:  This is a 29 year old with past medical history of lupus, anemia, prior history of C difficile, who presented to the emergency department complaining of nausea, vomiting, and diarrhea.  She also was complaining of abdominal pain.  HOSPITAL COURSE: 1. Probably lupus flare.  The patient was admitted to regular floor.     C difficile was negative, but she was treated empirically with     Flagyl due to her prior history of C difficile.  She was started on     stress dose of a steroid.  Rheumatology was consulted and they     helped with management.  Over the course of hospitalization, her     diarrhea improved and her abdominal pain improved. 2. UTI.  The patient was treated with ceftriaxone for 3 days.  Her     urine did not grow  anything.  She finished treatment. 3. Anemia of chronic disease.  Hemoglobin remained stable. 4. On the day of discharge, the patient was in improved condition.     Blood pressure was 117/86, respiration 18, pulse 98, temp 98.2, and     sat 99 on room air.  White blood cells 10.5, hemoglobin 11, and     creatinine 0.60.     Hartley Barefoot, MD     BR/MEDQ  D:  09/08/2011  T:  09/08/2011  Job:  [id number]  Electronically Signed by Hartley Barefoot MD on 09/27/2011 07:45:21 PM

## 2011-12-02 ENCOUNTER — Emergency Department (HOSPITAL_COMMUNITY)
Admission: EM | Admit: 2011-12-02 | Discharge: 2011-12-02 | Disposition: A | Discharge status: Home / Self Care | Payer: Medicaid Other | Attending: Emergency Medicine | Admitting: Emergency Medicine

## 2011-12-02 ENCOUNTER — Encounter (HOSPITAL_COMMUNITY): Payer: Self-pay | Admitting: *Deleted

## 2011-12-02 DIAGNOSIS — Z79899 Other long term (current) drug therapy: Secondary | ICD-10-CM | POA: Insufficient documentation

## 2011-12-02 DIAGNOSIS — R51 Headache: Secondary | ICD-10-CM | POA: Insufficient documentation

## 2011-12-02 MED ORDER — PROMETHAZINE HCL 25 MG/ML IJ SOLN
12.5000 mg | Freq: Once | INTRAMUSCULAR | Status: AC
  Administered 2011-12-02: 25 mg via INTRAMUSCULAR
  Filled 2011-12-02: qty 1

## 2011-12-02 MED ORDER — ISOMETHEPTENE-APAP-DICHLORAL 65-325-100 MG PO CAPS: 1.0000 | ORAL_CAPSULE | Freq: Four times a day (QID) | ORAL | Status: AC | PRN

## 2011-12-02 MED ORDER — DEXAMETHASONE SODIUM PHOSPHATE 10 MG/ML IJ SOLN
10.0000 mg | Freq: Once | INTRAMUSCULAR | Status: AC
  Administered 2011-12-02: 10 mg via INTRAMUSCULAR
  Filled 2011-12-02: qty 1

## 2011-12-02 MED ORDER — HYDROMORPHONE HCL PF 1 MG/ML IJ SOLN
1.0000 mg | Freq: Once | INTRAMUSCULAR | Status: AC
  Administered 2011-12-02: 1 mg via INTRAMUSCULAR
  Filled 2011-12-02: qty 1

## 2011-12-02 MED ORDER — DIPHENHYDRAMINE HCL 50 MG/ML IJ SOLN
25.0000 mg | Freq: Once | INTRAMUSCULAR | Status: AC
  Administered 2011-12-02: 50 mg via INTRAMUSCULAR
  Filled 2011-12-02: qty 1

## 2011-12-02 NOTE — ED Notes (Signed)
Pt states that she has a headache that feels like it is located predominantly in the back of her head and started 4 days ago.  Pt states she is having sensitivity to light and sound.  Pt denies hx of migraines.

## 2011-12-02 NOTE — ED Provider Notes (Signed)
Medical screening examination/treatment/procedure(s) were performed by non-physician practitioner and as supervising physician I was immediately available for consultation/collaboration.   Celene Kras, MD 12/02/11 [phone]

## 2011-12-02 NOTE — ED Provider Notes (Signed)
History     CSN: [id number] Arrival date & time: 12/02/2011  7:44 PM   First MD Initiated Contact with Patient 12/02/11 2000      Chief Complaint  Patient presents with  . Headache    (Consider location/radiation/quality/duration/timing/severity/associated sxs/prior treatment) Patient is a 30 y.o. female presenting with headaches.  Headache  This is a new problem. The current episode started more than 2 days ago. The problem occurs constantly. The problem has not changed since onset.The headache is associated with bright light and loud noise. The quality of the pain is described as throbbing. The pain is at a severity of 8/10. The pain is moderate. Pertinent negatives include no anorexia, no fever, no malaise/fatigue, no near-syncope, no orthopnea, no palpitations, no shortness of breath, no nausea and no vomiting.    Pt presents to the ED with complaints of a headache that started 4 days ago. She is having symptoms of sensitivity to light and sound. She denies recent illness, N/V/D/F. She does not have any chest pain, abdominal pain, urinary symptoms, or other symptoms of pain.    Past Medical History  Diagnosis Date  . Headache   . Anxiety   . Anemia   . Depression     Past Surgical History  Procedure Date  . Dilation and evacuation 11/10  . Laparoscopic tubal ligation 07/10/2011    Procedure: LAPAROSCOPIC TUBAL LIGATION;  Surgeon: Purcell Nails, MD;  Location: [facility];  Service: Gynecology;  Laterality: Bilateral;  fulguration    History reviewed. No pertinent family history.  History  Substance Use Topics  . Smoking status: Former Smoker -- 7 years    Types: Cigarettes    Quit date: 06/20/2011  . Smokeless tobacco: Not on file  . Alcohol Use: No    OB History    Grav Para Term Preterm Abortions TAB SAB Ect Mult Living                  Review of Systems  Constitutional: Negative for fever and malaise/fatigue.  Respiratory: Negative for shortness of breath.    Cardiovascular: Negative for palpitations, orthopnea and near-syncope.  Gastrointestinal: Negative for nausea, vomiting and anorexia.  Neurological: Positive for headaches.  All other systems reviewed and are negative.    Allergies  Ciprofloxacin; Nsaids; Reglan; and Sulfur  Home Medications   Current Outpatient Rx  Name Route Sig Dispense Refill  . AZATHIOPRINE 50 MG PO TABS Oral Take 150 mg by mouth daily.      Marland Kitchen CITALOPRAM HYDROBROMIDE 10 MG PO TABS Oral Take 10 mg by mouth daily.      . FENTANYL 50 MCG/HR TD PT72 Transdermal Place 1 patch onto the skin every 3 (three) days.      Marland Kitchen GABAPENTIN 800 MG PO TABS Oral Take 800 mg by mouth 3 (three) times daily.      Marland Kitchen HYDROXYCHLOROQUINE SULFATE 200 MG PO TABS Oral Take 200 mg by mouth 2 (two) times daily.      Marland Kitchen HYDROXYZINE PAMOATE 50 MG PO CAPS Oral Take 50 mg by mouth at bedtime.      Marland Kitchen ONDANSETRON 8 MG PO TBDP Oral Take 8 mg by mouth every 8 (eight) hours as needed. nausea     . PREDNISONE 20 MG PO TABS Oral Take 20 mg by mouth daily.      Marland Kitchen PROMETHAZINE HCL 50 MG PO TABS Oral Take 50 mg by mouth every 6 (six) hours as needed. nausea  BP 115/78  Pulse 98  Temp(Src) 98.2 F (36.8 C) (Oral)  Resp 20  SpO2 100%  Physical Exam  Constitutional: She appears well-developed and well-nourished.  HENT:  Head: Normocephalic and atraumatic.  Eyes: Conjunctivae are normal. Pupils are equal, round, and reactive to light.  Neck: Trachea normal, normal range of motion and full passive range of motion without pain. Neck supple. No rigidity. No edema, no erythema and normal range of motion present. No Brudzinski's sign and no Kernig's sign noted.  Cardiovascular: Normal rate, regular rhythm and normal pulses.   Pulmonary/Chest: Effort normal and breath sounds normal. Chest wall is not dull to percussion. She exhibits no tenderness, no crepitus, no edema, no deformity and no retraction.  Abdominal: Soft. Normal appearance and bowel  sounds are normal.  Musculoskeletal: Normal range of motion.  Neurological: She is alert. She has normal strength.  Skin: Skin is warm, dry and intact.  Psychiatric: She has a normal mood and affect. Her speech is normal and behavior is normal. Judgment and thought content normal. Cognition and memory are normal.    ED Course  Procedures (including critical care time)  Labs Reviewed - No data to display No results found.   No diagnosis found.    MDM  Pt given Phenergan, benadryl and Dexamethasone. Without any resolution of headache. Pt then given 1 mg Dilaudid with minimal improvement.   Pt is allergic to NSAIDS, cannot give Toradol. Have told pt about plan for treatment. Will have follow-up with PCP.        Dorthula Matas, PA 12/02/11 2312

## 2011-12-06 ENCOUNTER — Encounter (HOSPITAL_COMMUNITY): Payer: Self-pay | Admitting: *Deleted

## 2011-12-06 ENCOUNTER — Emergency Department (HOSPITAL_COMMUNITY)
Admission: EM | Admit: 2011-12-06 | Discharge: 2011-12-06 | Disposition: A | Discharge status: Home / Self Care | Payer: Medicaid Other | Attending: Emergency Medicine | Admitting: Emergency Medicine

## 2011-12-06 ENCOUNTER — Emergency Department (HOSPITAL_COMMUNITY)
Admission: EM | Admit: 2011-12-06 | Discharge: 2011-12-06 | Disposition: A | Discharge status: Home / Self Care | Payer: Medicaid Other | Source: Home / Self Care | Attending: Emergency Medicine | Admitting: Emergency Medicine

## 2011-12-06 ENCOUNTER — Other Ambulatory Visit: Payer: Self-pay

## 2011-12-06 DIAGNOSIS — IMO0001 Reserved for inherently not codable concepts without codable children: Secondary | ICD-10-CM | POA: Insufficient documentation

## 2011-12-06 DIAGNOSIS — R55 Syncope and collapse: Secondary | ICD-10-CM | POA: Insufficient documentation

## 2011-12-06 DIAGNOSIS — R109 Unspecified abdominal pain: Secondary | ICD-10-CM | POA: Insufficient documentation

## 2011-12-06 DIAGNOSIS — R9431 Abnormal electrocardiogram [ECG] [EKG]: Secondary | ICD-10-CM | POA: Insufficient documentation

## 2011-12-06 DIAGNOSIS — R51 Headache: Secondary | ICD-10-CM

## 2011-12-06 DIAGNOSIS — H53149 Visual discomfort, unspecified: Secondary | ICD-10-CM | POA: Insufficient documentation

## 2011-12-06 LAB — COMPREHENSIVE METABOLIC PANEL
ALT: 14 U/L (ref 0–35)
CO2: 27 mEq/L (ref 19–32)
Calcium: 9.2 mg/dL (ref 8.4–10.5)
Creatinine, Ser: 0.66 mg/dL (ref 0.50–1.10)
GFR calc Af Amer: >90 mL/min (ref >90)
GFR calc non Af Amer: >90 mL/min (ref >90)
Glucose, Bld: 82 mg/dL (ref 70–99)

## 2011-12-06 LAB — CBC
Hemoglobin: 10.5 g/dL — ABNORMAL LOW (ref 12.0–15.0)
MCHC: 31.9 g/dL (ref 30.0–36.0)
Platelets: 216 10*3/uL (ref 150–400)

## 2011-12-06 LAB — DIFFERENTIAL
Basophils Absolute: 0 10*3/uL (ref 0.0–0.1)
Basophils Relative: 1 % (ref 0–1)
Eosinophils Absolute: 0.1 10*3/uL (ref 0.0–0.7)
Lymphocytes Relative: 27 % (ref 12–46)
Neutrophils Relative %: 66 % (ref 43–77)

## 2011-12-06 LAB — URINALYSIS, ROUTINE W REFLEX MICROSCOPIC
Glucose, UA: NEGATIVE mg/dL
Leukocytes, UA: NEGATIVE
Nitrite: NEGATIVE
Protein, ur: 100 mg/dL — AB
Urobilinogen, UA: 1 mg/dL (ref 0.0–1.0)

## 2011-12-06 LAB — CK: Total CK: 42 U/L (ref 7–177)

## 2011-12-06 LAB — MAGNESIUM: Magnesium: 1.9 mg/dL (ref 1.5–2.5)

## 2011-12-06 MED ORDER — SODIUM CHLORIDE 0.9 % IV BOLUS (SEPSIS)
1000.0000 mL | Freq: Once | INTRAVENOUS | Status: AC
  Administered 2011-12-06: 1000 mL via INTRAVENOUS

## 2011-12-06 MED ORDER — HYDROMORPHONE HCL 4 MG PO TABS: 8.0000 mg | ORAL_TABLET | ORAL | Status: AC | PRN

## 2011-12-06 MED ORDER — HYDROMORPHONE HCL PF 1 MG/ML IJ SOLN: 1.0000 mg | Freq: Once | INTRAMUSCULAR | Status: DC

## 2011-12-06 MED ORDER — PROMETHAZINE HCL 25 MG/ML IJ SOLN
25.0000 mg | INTRAMUSCULAR | Status: AC
  Administered 2011-12-06: 25 mg via INTRAVENOUS
  Filled 2011-12-06: qty 1

## 2011-12-06 MED ORDER — HYDROMORPHONE HCL PF 2 MG/ML IJ SOLN
INTRAMUSCULAR | Status: AC
  Administered 2011-12-06: 1 mg
  Filled 2011-12-06: qty 1

## 2011-12-06 MED ORDER — DIPHENHYDRAMINE HCL 50 MG/ML IJ SOLN
25.0000 mg | Freq: Once | INTRAMUSCULAR | Status: AC
  Administered 2011-12-06: 25 mg via INTRAVENOUS
  Filled 2011-12-06: qty 1

## 2011-12-06 MED ORDER — HYDROMORPHONE HCL PF 2 MG/ML IJ SOLN
2.0000 mg | Freq: Once | INTRAMUSCULAR | Status: AC
  Administered 2011-12-06: 2 mg via INTRAVENOUS
  Filled 2011-12-06: qty 1

## 2011-12-06 MED ORDER — POTASSIUM CHLORIDE CRYS ER 20 MEQ PO TBCR
40.0000 meq | EXTENDED_RELEASE_TABLET | Freq: Once | ORAL | Status: AC
  Administered 2011-12-06: 40 meq via ORAL
  Filled 2011-12-06: qty 2

## 2011-12-06 NOTE — ED Provider Notes (Signed)
History     CSN: [id number] Arrival date & time: 12/06/2011  7:57 AM   First MD Initiated Contact with Patient 12/06/11 [phone]      Chief Complaint  Patient presents with  . Generalized Body Aches  . Headache    (Consider location/radiation/quality/duration/timing/severity/associated sxs/prior treatment) HPI Complains of diffuse headache, diffuse myalgias, and arms and legs diffuse abdominal pain onset one week ago. Seen here 4 days ago., No relief and seen by her primary care doctor yesterday prescribed Maxalt without relief. Headache is not feeling of her migraines. Nothing makes symptoms better or worse no anorexia no chest pain no fever last temperature 99. N[place]ough No other associated symptoms. Last bowel movement yesterday normal Past Medical History  Diagnosis Date  . Headache   . Anxiety   . Anemia   . Depression    lupus, lupus nephritis  Past Surgical History  Procedure Date  . Dilation and evacuation 11/10  . Laparoscopic tubal ligation 07/10/2011    Procedure: LAPAROSCOPIC TUBAL LIGATION;  Surgeon: Purcell Nails, MD;  Location: [facility];  Service: Gynecology;  Laterality: Bilateral;  fulguration    No family history on file.  History  Substance Use Topics  . Smoking status: Former Smoker -- 7 years    Types: Cigarettes    Quit date: 06/20/2011  . Smokeless tobacco: Not on file  . Alcohol Use: No    OB History    Grav Para Term Preterm Abortions TAB SAB Ect Mult Living                  Review of Systems  Constitutional: Negative.   HENT: Negative.   Eyes: Positive for photophobia.  Respiratory: Negative.   Cardiovascular: Negative.   Gastrointestinal: Positive for abdominal pain.  Musculoskeletal: Positive for myalgias.  Skin: Negative.   Neurological:       Headache  Hematological: Negative.   Psychiatric/Behavioral: Negative.     Allergies  Ciprofloxacin; Nsaids; Reglan; and Sulfur  Home Medications   Current Outpatient Rx  Name Route  Sig Dispense Refill  . AZATHIOPRINE 50 MG PO TABS Oral Take 150 mg by mouth daily. Pt takes 3 tabs daily    . CITALOPRAM HYDROBROMIDE 10 MG PO TABS Oral Take 10 mg by mouth daily.      . FENTANYL 50 MCG/HR TD PT72 Transdermal Place 1 patch onto the skin every 3 (three) days.      Marland Kitchen GABAPENTIN 800 MG PO TABS Oral Take 800 mg by mouth 3 (three) times daily.      Marland Kitchen HYDROXYCHLOROQUINE SULFATE 200 MG PO TABS Oral Take 200 mg by mouth 2 (two) times daily.      Marland Kitchen HYDROXYZINE PAMOATE 50 MG PO CAPS Oral Take 50 mg by mouth at bedtime.      . ISOMETHEPTENE-APAP-DICHLORAL 65-325-100 MG PO CAPS Oral Take 1 capsule by mouth 4 (four) times daily as needed. 30 capsule 0  . ONDANSETRON 8 MG PO TBDP Oral Take 8 mg by mouth every 8 (eight) hours as needed. nausea     . PREDNISONE 20 MG PO TABS Oral Take 20 mg by mouth daily.      Marland Kitchen PROMETHAZINE HCL 50 MG PO TABS Oral Take 50 mg by mouth every 6 (six) hours as needed. nausea       BP 116/73  Pulse 79  Temp(Src) 99.5 F (37.5 C) (Oral)  Resp 16  SpO2 100%  LMP 11/29/2011  Physical Exam  Nursing note and vitals reviewed. Constitutional:  She is oriented to person, place, and time. She appears well-developed and well-nourished.       Appears mildly uncomfortable nontoxic alert appropriate Glasgow Coma Score 15  HENT:  Head: Normocephalic and atraumatic.  Right Ear: External ear normal.  Left Ear: External ear normal.       Tympanic membranes normal  Eyes: Conjunctivae are normal. Pupils are equal, round, and reactive to light.  Neck: Neck supple. No tracheal deviation present. No thyromegaly present.  Cardiovascular: Normal rate and regular rhythm.   No murmur heard. Pulmonary/Chest: Effort normal and breath sounds normal.  Abdominal: Soft. Bowel sounds are normal. She exhibits no distension. There is no tenderness. There is no rebound.  Musculoskeletal: Normal range of motion. She exhibits no edema and no tenderness.  Lymphadenopathy:    She has no  cervical adenopathy.  Neurological: She is alert and oriented to person, place, and time. She has normal reflexes. No cranial nerve deficit. Coordination normal.  Skin: Skin is warm and dry. No rash noted.  Psychiatric: She has a normal mood and affect.     ED Course  Procedures (including critical care time)  Labs Reviewed - No data to display No results found.  After treatment with intravenous dialogue patient feels improved and ready to go home No diagnosis found.  Results for orders placed during the hospital encounter of 12/06/11  COMPREHENSIVE METABOLIC PANEL      Component Value Range   Sodium 141  135 - 145 (mEq/L)   Potassium 3.4 (*) 3.5 - 5.1 (mEq/L)   Chloride 106  96 - 112 (mEq/L)   CO2 27  19 - 32 (mEq/L)   Glucose, Bld 82  70 - 99 (mg/dL)   BUN 5 (*) 6 - 23 (mg/dL)   Creatinine, Ser 4.09  0.50 - 1.10 (mg/dL)   Calcium 9.2  8.4 - 81.1 (mg/dL)   Total Protein 7.3  6.0 - 8.3 (g/dL)   Albumin 3.7  3.5 - 5.2 (g/dL)   AST 18  0 - 37 (U/L)   ALT 14  0 - 35 (U/L)   Alkaline Phosphatase 48  39 - 117 (U/L)   Total Bilirubin 0.2 (*) 0.3 - 1.2 (mg/dL)   GFR calc non Af Amer >90  >90 (mL/min)   GFR calc Af Amer >90  >90 (mL/min)  CBC      Component Value Range   WBC 4.2  4.0 - 10.5 (K/uL)   RBC 3.57 (*) 3.87 - 5.11 (MIL/uL)   Hemoglobin 10.5 (*) 12.0 - 15.0 (g/dL)   HCT 91.4 (*) 78.2 - 46.0 (%)   MCV 92.2  78.0 - 100.0 (fL)   MCH 29.4  26.0 - 34.0 (pg)   MCHC 31.9  30.0 - 36.0 (g/dL)   RDW 95.6  21.3 - 08.6 (%)   Platelets 216  150 - 400 (K/uL)  DIFFERENTIAL      Component Value Range   Neutrophils Relative 66  43 - 77 (%)   Lymphocytes Relative 27  12 - 46 (%)   Monocytes Relative 4  3 - 12 (%)   Eosinophils Relative 2  0 - 5 (%)   Basophils Relative 1  0 - 1 (%)   Neutro Abs 2.8  1.7 - 7.7 (K/uL)   Lymphs Abs 1.1  0.7 - 4.0 (K/uL)   Monocytes Absolute 0.2  0.1 - 1.0 (K/uL)   Eosinophils Absolute 0.1  0.0 - 0.7 (K/uL)   Basophils Absolute 0.0  0.0 - 0.1 (K/uL)  WBC Morphology MILD LEFT SHIFT (1-5% METAS, OCC MYELO, OCC BANDS)    URINALYSIS, ROUTINE W REFLEX MICROSCOPIC      Component Value Range   Color, Urine YELLOW  YELLOW    APPearance CLOUDY (*) CLEAR    Specific Gravity, Urine 1.024  1.005 - 1.030    pH 7.5  5.0 - 8.0    Glucose, UA NEGATIVE  NEGATIVE (mg/dL)   Hgb urine dipstick NEGATIVE  NEGATIVE    Bilirubin Urine NEGATIVE  NEGATIVE    Ketones, ur NEGATIVE  NEGATIVE (mg/dL)   Protein, ur 962 (*) NEGATIVE (mg/dL)   Urobilinogen, UA 1.0  0.0 - 1.0 (mg/dL)   Nitrite NEGATIVE  NEGATIVE    Leukocytes, UA NEGATIVE  NEGATIVE   CK      Component Value Range   Total CK 42  7 - 177 (U/L)  POCT PREGNANCY, URINE      Component Value Range   Preg Test, Ur NEGATIVE    URINE MICROSCOPIC-ADD ON      Component Value Range   Squamous Epithelial / LPF FEW (*) RARE    Bacteria, UA FEW (*) RARE    Urine-Other MUCOUS PRESENT     No results found.   MDM  All symptoms felt to be nonspecific. There is no evidence of rhabdomyolysis. Patient has chronic pain. Reports no longer on fentanyl. Plan prescriptionHydromorphine Followup Dr.Avbuere Diagnosis#. 1 nonspecific headache #2 nonspecific abdominal pain #3 myalgias        Doug Sou, MD 12/06/11 1209

## 2011-12-06 NOTE — ED Notes (Signed)
Phlebotomy at bedside.

## 2011-12-06 NOTE — ED Notes (Signed)
Pt discussed life stressors and how her 28 year old has lots of after school activities and her daughter is not making friends quickly at school.

## 2011-12-06 NOTE — ED Notes (Signed)
Pt alert and oriented x4. Respirations even and unlabored, bilateral symmetrical rise and fall of chest. Skin warm and dry. In no acute distress. Denies needs.   

## 2011-12-06 NOTE — ED Notes (Signed)
Pt is A/O x4. Skin warm and dry. Respirations even and unlabored. NAD noted at this time.   

## 2011-12-06 NOTE — ED Provider Notes (Signed)
History    30yf with syncope/near syncope. Evaluate din Ed earlier today for HA. Discharged and on way out felt lightheaded, diaphoretic and "passed out." Questionable LOC. Currently feels better. Still has HA from earlier but is improved. Denies singificant acute pain anywhere else. No sob. No feever or chills. deneis hx of blood clot.   CSN: [id number] Arrival date & time: 12/06/2011  1:05 PM   First MD Initiated Contact with Patient 12/06/11 1511      Chief Complaint  Patient presents with  . Weakness  . Excessive Sweating    (Consider location/radiation/quality/duration/timing/severity/associated sxs/prior treatment) HPI  Past Medical History  Diagnosis Date  . Headache   . Anxiety   . Anemia   . Depression     Past Surgical History  Procedure Date  . Dilation and evacuation 11/10  . Laparoscopic tubal ligation 07/10/2011    Procedure: LAPAROSCOPIC TUBAL LIGATION;  Surgeon: Purcell Nails, MD;  Location: [facility];  Service: Gynecology;  Laterality: Bilateral;  fulguration    History reviewed. No pertinent family history.  History  Substance Use Topics  . Smoking status: Former Smoker -- 7 years    Types: Cigarettes    Quit date: 06/20/2011  . Smokeless tobacco: Not on file  . Alcohol Use: No    OB History    Grav Para Term Preterm Abortions TAB SAB Ect Mult Living                  Review of Systems   Review of symptoms negative unless otherwise noted in HPI.   Allergies  Ciprofloxacin; Nsaids; Reglan; and Sulfur  Home Medications   Current Outpatient Rx  Name Route Sig Dispense Refill  . AZATHIOPRINE 50 MG PO TABS Oral Take 150 mg by mouth daily. Pt takes 3 tabs daily    . CITALOPRAM HYDROBROMIDE 10 MG PO TABS Oral Take 10 mg by mouth daily.      . FENTANYL 50 MCG/HR TD PT72 Transdermal Place 1 patch onto the skin every 3 (three) days.      Marland Kitchen GABAPENTIN 800 MG PO TABS Oral Take 800 mg by mouth 3 (three) times daily.      Marland Kitchen HYDROMORPHONE HCL 4 MG PO  TABS Oral Take 2 tablets (8 mg total) by mouth every 4 (four) hours as needed for pain. 20 tablet 0  . HYDROXYCHLOROQUINE SULFATE 200 MG PO TABS Oral Take 200 mg by mouth 2 (two) times daily.      Marland Kitchen HYDROXYZINE PAMOATE 50 MG PO CAPS Oral Take 50 mg by mouth at bedtime.      . ISOMETHEPTENE-APAP-DICHLORAL 65-325-100 MG PO CAPS Oral Take 1 capsule by mouth 4 (four) times daily as needed. 30 capsule 0  . ONDANSETRON 8 MG PO TBDP Oral Take 8 mg by mouth every 8 (eight) hours as needed. nausea     . PREDNISONE 20 MG PO TABS Oral Take 20 mg by mouth daily.      Marland Kitchen PROMETHAZINE HCL 50 MG PO TABS Oral Take 50 mg by mouth every 6 (six) hours as needed. nausea       BP 113/75  Pulse 75  Temp(Src) 97 F (36.1 C) (Oral)  Resp 18  SpO2 95%  LMP 11/29/2011  Physical Exam  Nursing note and vitals reviewed. Constitutional: She is oriented to person, place, and time. She appears well-developed and well-nourished. No distress.  HENT:  Head: Normocephalic and atraumatic.  Eyes: Conjunctivae are normal. Pupils are equal, round, and reactive  to light. Right eye exhibits no discharge. Left eye exhibits no discharge.  Neck: Neck supple.  Cardiovascular: Normal rate, regular rhythm and normal heart sounds.  Exam reveals no gallop and no friction rub.   No murmur heard. Pulmonary/Chest: Effort normal and breath sounds normal. No respiratory distress.  Abdominal: Soft. She exhibits no distension. There is no tenderness.  Musculoskeletal: She exhibits no edema and no tenderness.  Lymphadenopathy:    She has no cervical adenopathy.  Neurological: She is alert and oriented to person, place, and time. No cranial nerve deficit. She exhibits normal muscle tone. Coordination normal.  Skin: Skin is warm and dry.  Psychiatric: She has a normal mood and affect. Her behavior is normal. Thought content normal.    ED Course  Procedures (including critical care time)  Labs Reviewed - No data to display No results  found.  EKG:  Rhythm: normal sinus Rate: 87 Axis: normal Intervals: prolonged QTc, 520 ms ST segments: NS ST changes. Comparison: EKG from 03/2011 with normal QT interval  EKG:  Rhythm: normal sinus Rate: 81 Axis: normal Intervals: normal ST segments: NS ST changes Comparison: Interval resolution of prolonged QT  1. Prolonged Q-T interval on ECG   2. Near syncope     5:28 PM Discussed with cardiology, Dr Graciela Husbands. Suspects may be from promethazine. Recommends IVF and repeat EKG after period of observation.    MDM  30yf near syncopal/questionable true syncopal event on DC after being eval'd in ED. Previous w/u unremarkable including cmp, cbc, [place] relatively unremarkable aside from minimal hypoK and anemia. Possible dysrhythmia. QTc is prolonged. Did get dose of phenergan in ED prior to seeing EKG. Pt is on phenergan and zofran at home. Possible related to meds? Previous EKG with normal QT interval.  Discussed with cards. Recommend observation in ED and IVF. If repeat EKG improved then says could be discharged with DC of potentially QT prolonging agents. If still significantly prolonged will admit for observation. Doubt PE. Suspect primary HA. Consider secondary causes such as bleed, infectious, mass, venous sinus thrombosis, CO poisoning, etc but clinically doubt. Neuro exam is nonfocal.        Raeford Razor, MD 12/08/11 [phone]

## 2011-12-06 NOTE — ED Notes (Signed)
Red/c'd home ambulatory after near syncope after prior d/c

## 2011-12-06 NOTE — ED Notes (Signed)
Pt was just seen here for same, while waiting for her mother to pick her up in the waiting area, pt reports she started to become more dizzy and diaphoretic.  Reports going to the BR in the waiting area to wash her face with water without relief.  Pt reports near syncopal episode.

## 2011-12-06 NOTE — ED Notes (Signed)
MD at bedside. 

## 2011-12-06 NOTE — ED Notes (Signed)
Pt reports HA x1 week. Stomach pain as well, denies N/V. Seen here Sunday, went to PCP Mon and Tues. Given Maxalt, no relief.

## 2011-12-06 NOTE — ED Notes (Signed)
Dr. Juleen China notified of pt's rash on forearm. States he wants pt moved to exam room and placed on cardiac monitor due to possible EKG changes.

## 2011-12-06 NOTE — ED Notes (Signed)
IV team called to start an IV

## 2011-12-06 NOTE — ED Notes (Signed)
redischarged after near syncope

## 2012-02-11 ENCOUNTER — Emergency Department (HOSPITAL_COMMUNITY)
Admission: EM | Admit: 2012-02-11 | Discharge: 2012-02-12 | Disposition: A | Discharge status: Home / Self Care | Payer: Medicaid Other | Attending: Emergency Medicine | Admitting: Emergency Medicine

## 2012-02-11 ENCOUNTER — Encounter (HOSPITAL_COMMUNITY): Payer: Self-pay | Admitting: *Deleted

## 2012-02-11 DIAGNOSIS — N898 Other specified noninflammatory disorders of vagina: Secondary | ICD-10-CM | POA: Insufficient documentation

## 2012-02-11 DIAGNOSIS — R102 Pelvic and perineal pain: Secondary | ICD-10-CM

## 2012-02-11 DIAGNOSIS — R112 Nausea with vomiting, unspecified: Secondary | ICD-10-CM | POA: Insufficient documentation

## 2012-02-11 DIAGNOSIS — N949 Unspecified condition associated with female genital organs and menstrual cycle: Secondary | ICD-10-CM | POA: Insufficient documentation

## 2012-02-11 DIAGNOSIS — M329 Systemic lupus erythematosus, unspecified: Secondary | ICD-10-CM | POA: Insufficient documentation

## 2012-02-11 DIAGNOSIS — R10819 Abdominal tenderness, unspecified site: Secondary | ICD-10-CM | POA: Insufficient documentation

## 2012-02-11 DIAGNOSIS — K644 Residual hemorrhoidal skin tags: Secondary | ICD-10-CM | POA: Insufficient documentation

## 2012-02-11 HISTORY — DX: Systemic lupus erythematosus, unspecified: M32.9

## 2012-02-11 HISTORY — DX: Reserved for concepts with insufficient information to code with codable children: IMO0002

## 2012-02-11 LAB — DIFFERENTIAL
Basophils Relative: 2 % — ABNORMAL HIGH (ref 0–1)
Eosinophils Relative: 2 % (ref 0–5)
Lymphocytes Relative: 30 % (ref 12–46)
Monocytes Absolute: 0.4 10*3/uL (ref 0.1–1.0)
Monocytes Relative: 10 % (ref 3–12)
Neutrophils Relative %: 56 % (ref 43–77)

## 2012-02-11 LAB — CBC
Hemoglobin: 11.1 g/dL — ABNORMAL LOW (ref 12.0–15.0)
MCH: 29.2 pg (ref 26.0–34.0)
Platelets: 243 10*3/uL (ref 150–400)
RBC: 3.8 MIL/uL — ABNORMAL LOW (ref 3.87–5.11)
WBC: 4.3 10*3/uL (ref 4.0–10.5)

## 2012-02-11 LAB — WET PREP, GENITAL: Trich, Wet Prep: NONE SEEN

## 2012-02-11 LAB — COMPREHENSIVE METABOLIC PANEL
ALT: 42 U/L — ABNORMAL HIGH (ref 0–35)
AST: 62 U/L — ABNORMAL HIGH (ref 0–37)
Albumin: 3.7 g/dL (ref 3.5–5.2)
Chloride: 105 mEq/L (ref 96–112)
Creatinine, Ser: 0.49 mg/dL — ABNORMAL LOW (ref 0.50–1.10)
Sodium: 139 mEq/L (ref 135–145)
Total Bilirubin: 0.3 mg/dL (ref 0.3–1.2)

## 2012-02-11 LAB — URINALYSIS, ROUTINE W REFLEX MICROSCOPIC
Glucose, UA: NEGATIVE mg/dL
Hgb urine dipstick: NEGATIVE
Protein, ur: >300 mg/dL — AB
Urobilinogen, UA: 1 mg/dL (ref 0.0–1.0)

## 2012-02-11 LAB — URINE MICROSCOPIC-ADD ON

## 2012-02-11 MED ORDER — MORPHINE SULFATE 4 MG/ML IJ SOLN
4.0000 mg | Freq: Once | INTRAMUSCULAR | Status: AC
  Administered 2012-02-11: 4 mg via INTRAVENOUS
  Filled 2012-02-11: qty 1

## 2012-02-11 MED ORDER — ONDANSETRON HCL 4 MG/2ML IJ SOLN
4.0000 mg | Freq: Once | INTRAMUSCULAR | Status: AC
  Administered 2012-02-11: 4 mg via INTRAVENOUS
  Filled 2012-02-11: qty 2

## 2012-02-11 MED ORDER — METHYLPREDNISOLONE SODIUM SUCC 125 MG IJ SOLR
125.0000 mg | Freq: Once | INTRAMUSCULAR | Status: AC
  Administered 2012-02-11: 125 mg via INTRAVENOUS
  Filled 2012-02-11: qty 2

## 2012-02-11 MED ORDER — HYDROMORPHONE HCL PF 1 MG/ML IJ SOLN
1.0000 mg | Freq: Once | INTRAMUSCULAR | Status: AC
  Administered 2012-02-11: 1 mg via INTRAVENOUS
  Filled 2012-02-11: qty 1

## 2012-02-11 NOTE — ED Provider Notes (Signed)
History     CSN: [id number]  Arrival date & time 02/11/12  1437   First MD Initiated Contact with Patient 02/11/12 2013      Chief Complaint  Patient presents with  . Generalized Body Aches    (Consider location/radiation/quality/duration/timing/severity/associated sxs/prior treatment) HPI History provided by pt.   Pt has h/o Lupus.  For the past week, she has had gradually worsening, diffuse body aches.  Severe over the past 2 days and refractory to tylenol.  4 days ago she developed nausea and anorexia.  Is tolerating fluids but vomits if she attempts to eat.  Also c/o constant pain in left suprapubic region that radiates into her legs.   Has never had pain like this before.  No associated fever, diarrhea, urinary or vaginal sx.  LMP 2 months ago but periods are usually irregular and tubes are tied.  Has had blood in stool but chronic and attributed to hemorrhoids.  No recent URI sx.   Also c/o pruritic rash on upper chest and face for the past 2-3 wks.  No relief w/ hydroxyzine and hydrocortisone cream.  No h/o eczema.  No known allergens.    Past Medical History  Diagnosis Date  . Headache   . Anxiety   . Anemia   . Depression   . Lupus     Past Surgical History  Procedure Date  . Dilation and evacuation 11/10  . Laparoscopic tubal ligation 07/10/2011    Procedure: LAPAROSCOPIC TUBAL LIGATION;  Surgeon: Purcell Nails, MD;  Location: [facility];  Service: Gynecology;  Laterality: Bilateral;  fulguration    No family history on file.  History  Substance Use Topics  . Smoking status: Former Smoker -- 7 years    Types: Cigarettes    Quit date: 06/20/2011  . Smokeless tobacco: Not on file  . Alcohol Use: No    OB History    Grav Para Term Preterm Abortions TAB SAB Ect Mult Living                  Review of Systems  All other systems reviewed and are negative.    Allergies  Ciprofloxacin; Nsaids; Reglan; and Sulfur  Home Medications   Current Outpatient Rx    Name Route Sig Dispense Refill  . AZATHIOPRINE 50 MG PO TABS Oral Take 150 mg by mouth daily. Pt takes 3 tabs daily    . DESVENLAFAXINE SUCCINATE ER 50 MG PO TB24 Oral Take 50 mg by mouth daily.    Marland Kitchen GABAPENTIN 800 MG PO TABS Oral Take 800 mg by mouth 3 (three) times daily.      Marland Kitchen HYDROXYCHLOROQUINE SULFATE 200 MG PO TABS Oral Take 200 mg by mouth 2 (two) times daily.      Marland Kitchen HYDROXYZINE PAMOATE 50 MG PO CAPS Oral Take 50 mg by mouth at bedtime as needed. For itching    . ONDANSETRON 8 MG PO TBDP Oral Take 8 mg by mouth every 8 (eight) hours as needed. nausea     . PREDNISONE 20 MG PO TABS Oral Take 40 mg by mouth daily.       BP 118/70  Pulse 104  Temp(Src) 98.9 F (37.2 C) (Oral)  Resp 20  Ht 5\' 5"  (1.651 m)  Wt 130 lb (58.968 kg)  BMI 21.63 kg/m2  SpO2 100%  LMP 12/06/2011  Physical Exam  Nursing note and vitals reviewed. Constitutional: She is oriented to person, place, and time. She appears well-developed and well-nourished. No distress.  HENT:  Head: Normocephalic and atraumatic.  Eyes:       Normal appearance  Neck: Normal range of motion.  Cardiovascular: Normal rate and regular rhythm.   Pulmonary/Chest: Effort normal and breath sounds normal.  Genitourinary:       Nml external genitalia.  Moderate amt thin, white vaginal discharge.  No vaginal bleeding.  Cervix closed and appears nml.  No cervical motion tenderness.  Mild-mod left adnexal tenderness.   External hemorrhoids.  Nml rectal tone and rectum non-tender.  No gross blood.   Musculoskeletal:       No peripheral edema or calf tenderness.   Neurological: She is alert and oriented to person, place, and time.  Skin: Skin is warm and dry. No rash noted.       Scaly patches on upper chest, face and along hairline.  Patient scratching.  Hyperpigmentation on arms and face that pt reports is chronic and related to her lupus.   Psychiatric: She has a normal mood and affect. Her behavior is normal.    ED Course   Procedures (including critical care time)  Labs Reviewed  URINALYSIS, ROUTINE W REFLEX MICROSCOPIC - Abnormal; Notable for the following:    Color, Urine AMBER (*) BIOCHEMICALS MAY BE AFFECTED BY COLOR   APPearance TURBID (*)    Specific Gravity, Urine 1.037 (*)    Bilirubin Urine SMALL (*)    Ketones, ur TRACE (*)    Protein, ur >300 (*)    Leukocytes, UA TRACE (*)    All other components within normal limits  URINE MICROSCOPIC-ADD ON - Abnormal; Notable for the following:    Squamous Epithelial / LPF MANY (*)    Bacteria, UA MANY (*)    All other components within normal limits  CBC - Abnormal; Notable for the following:    RBC 3.80 (*)    Hemoglobin 11.1 (*)    HCT 34.3 (*)    All other components within normal limits  DIFFERENTIAL - Abnormal; Notable for the following:    Basophils Relative 2 (*)    All other components within normal limits  COMPREHENSIVE METABOLIC PANEL - Abnormal; Notable for the following:    Creatinine, Ser 0.49 (*)    AST 62 (*) SLIGHT HEMOLYSIS   ALT 42 (*)    All other components within normal limits  WET PREP, GENITAL - Abnormal; Notable for the following:    Clue Cells Wet Prep HPF POC RARE (*)    WBC, Wet Prep HPF POC RARE (*)    All other components within normal limits  POCT PREGNANCY, URINE  GC/CHLAMYDIA PROBE AMP, GENITAL  OCCULT BLOOD X 1 CARD TO LAB, STOOL   US Transvaginal Non-ob  02/12/2012  *RADIOLOGY REPORT*  Clinical Data: Left adnexal tenderness.  TRANSABDOMINAL AND TRANSVAGINAL ULTRASOUND OF PELVIS Technique:  Both transabdominal and transvaginal ultrasound examinations of the pelvis were performed. Transabdominal technique was performed for global imaging of the pelvis including uterus, ovaries, adnexal regions, and pelvic cul-de-sac.  Comparison: CT abdomen and pelvis 08/15/2011   It was necessary to proceed with endovaginal exam following the transabdominal exam to visualize the uterus, ovaries, and endometrium.  Findings:   Uterus: The uterus measures 8 x 3.6 x 4.3 cm.  No focal myometrial mass lesions demonstrated.  Endometrium: Normal in thickness and appearance.  Endometrial stripe thickness measures about 4 mm.  Right ovary:  Right ovary measures 3.1 x 2.1 x 2.7 cm.  Simple appearing cyst measures about 17 x 22 x 17  mm diameter.  This is likely to represent a functional cyst.  Flow is demonstrated within the right ovary on color flow Doppler imaging.  Left ovary: The left ovary measures 3.5 x 2.4 x 1.9 cm.  There is a simple appearing cyst measuring 21 x 11 x 16 mm.  This is likely represent a functional cyst.  Flow is demonstrated within the left ovary on color flow Doppler imaging.  Other findings: No free fluid  IMPRESSION: Normal study. No evidence of pelvic mass or other significant abnormality.  Original Report Authenticated By: Marlon Pel, M.D.   US Pelvis Complete  02/12/2012  *RADIOLOGY REPORT*  Clinical Data: Left adnexal tenderness.  TRANSABDOMINAL AND TRANSVAGINAL ULTRASOUND OF PELVIS Technique:  Both transabdominal and transvaginal ultrasound examinations of the pelvis were performed. Transabdominal technique was performed for global imaging of the pelvis including uterus, ovaries, adnexal regions, and pelvic cul-de-sac.  Comparison: CT abdomen and pelvis 08/15/2011   It was necessary to proceed with endovaginal exam following the transabdominal exam to visualize the uterus, ovaries, and endometrium.  Findings:  Uterus: The uterus measures 8 x 3.6 x 4.3 cm.  No focal myometrial mass lesions demonstrated.  Endometrium: Normal in thickness and appearance.  Endometrial stripe thickness measures about 4 mm.  Right ovary:  Right ovary measures 3.1 x 2.1 x 2.7 cm.  Simple appearing cyst measures about 17 x 22 x 17 mm diameter.  This is likely to represent a functional cyst.  Flow is demonstrated within the right ovary on color flow Doppler imaging.  Left ovary: The left ovary measures 3.5 x 2.4 x 1.9 cm.  There is  a simple appearing cyst measuring 21 x 11 x 16 mm.  This is likely represent a functional cyst.  Flow is demonstrated within the left ovary on color flow Doppler imaging.  Other findings: No free fluid  IMPRESSION: Normal study. No evidence of pelvic mass or other significant abnormality.  Original Report Authenticated By: Marlon Pel, M.D.     1. Lupus   2. Nausea and vomiting   3. Pelvic pain       MDM  30yo F w/ lupus presents w/ multiple complaints including LLQ pain, N/V, body aches and pruritic rash to chest and face.  Pt has been out of her plaquenil and prednisone x 7 days.  Exam sig for no fever, no coughing, clear lungs, benign abdomen w/ mild, left suprapubic tenderness, left adnexal tenderness, and scaly plaques on upper chest and along hairline.  Labs unremarkable.  I suspect that body aches are secondary to lupus exacerbation and medication non-compliance; no s/sx to suggest infectious etiology.  Left suprapubic pain evaluated w/ transvaginal US which was neg.  Rash looks like a contact dermatitis, possibly from patient's wig, though she does not believe this is the case.  All results discussed w/ pt.  She has had relief w/ IV zofran and dilaudid and is tolerating pos.  D/c'd home w/ vicodin, zofran and refills of her prescriptions, including double dose of prednisone x 5 days.  She reports that her rheumatologist does the same when she has an acute flare.  She has vistaril and hydrocortisone cream at home for rash.  Recommended that she f/u with her rheumatologist this week.          Arie Sabina Samoset, [place] 02/12/12 [phone]

## 2012-02-11 NOTE — ED Notes (Signed)
PA at bedside for pelvic exam.

## 2012-02-11 NOTE — ED Notes (Signed)
Pt states "I have lupus but last night I just hurt all over, I also have been itching and the medicine I take for it has not been helping, had the h/a, haven't been able to eat anything last couple of days, have been nauseated"

## 2012-02-11 NOTE — ED Notes (Signed)
PA, Catherine, at bedside. 

## 2012-02-12 ENCOUNTER — Emergency Department (HOSPITAL_COMMUNITY): Payer: Medicaid Other

## 2012-02-12 LAB — GC/CHLAMYDIA PROBE AMP, GENITAL
Chlamydia, DNA Probe: NEGATIVE
GC Probe Amp, Genital: NEGATIVE

## 2012-02-12 LAB — OCCULT BLOOD, POC DEVICE: Fecal Occult Bld: NEGATIVE

## 2012-02-12 MED ORDER — ONDANSETRON HCL 4 MG PO TABS: 8.0000 mg | ORAL_TABLET | Freq: Four times a day (QID) | ORAL | Status: AC

## 2012-02-12 MED ORDER — HYDROCODONE-ACETAMINOPHEN 5-325 MG PO TABS: 1.0000 | ORAL_TABLET | ORAL | Status: AC | PRN

## 2012-02-12 MED ORDER — DESVENLAFAXINE SUCCINATE ER 50 MG PO TB24: 50.0000 mg | ORAL_TABLET | Freq: Every day | ORAL | Status: DC

## 2012-02-12 MED ORDER — PREDNISONE 10 MG PO TABS: 20.0000 mg | ORAL_TABLET | Freq: Every day | ORAL | Status: DC

## 2012-02-12 MED ORDER — HYDROXYCHLOROQUINE SULFATE 200 MG PO TABS: 200.0000 mg | ORAL_TABLET | Freq: Two times a day (BID) | ORAL | Status: DC

## 2012-02-12 MED ORDER — AZATHIOPRINE 50 MG PO TABS: 150.0000 mg | ORAL_TABLET | Freq: Every day | ORAL | Status: DC

## 2012-02-12 MED ORDER — PREDNISONE 20 MG PO TABS: ORAL_TABLET | ORAL | Status: DC

## 2012-02-12 MED ORDER — AZATHIOPRINE 50 MG PO TABS: 50.0000 mg | ORAL_TABLET | Freq: Every day | ORAL | Status: DC

## 2012-02-12 MED ORDER — OXYCODONE-ACETAMINOPHEN 5-325 MG PO TABS: 1.0000 | ORAL_TABLET | Freq: Once | ORAL | Status: DC

## 2012-02-12 NOTE — ED Notes (Signed)
Patient returned from US.

## 2012-02-12 NOTE — Discharge Instructions (Signed)
Take vicodin as prescribed for severe pain.   Do not drive within four hours of taking this medication (may cause drowsiness or confusion).  Take zofran as needed for nausea.   Increase your prednisone from 20mg  once a day to 20mg  twice a day.  I have prescribed you 10 more pills so you don't run out too soon.  Follow up with your rheumatologist.  You should also follow up with a gynecologist.  I have referred you to one if you don't have one already.   You should return to the ER if you develop fever, worsening pain or uncontrolled vomiting.

## 2012-02-13 NOTE — ED Provider Notes (Signed)
Medical screening examination/treatment/procedure(s) were performed by non-physician practitioner and as supervising physician I was immediately available for consultation/collaboration.   Shelda Jakes, MD 02/13/12 [phone]

## 2012-05-05 ENCOUNTER — Encounter (HOSPITAL_COMMUNITY): Payer: Self-pay | Admitting: *Deleted

## 2012-05-05 ENCOUNTER — Inpatient Hospital Stay (HOSPITAL_COMMUNITY)
Admission: EM | Admit: 2012-05-05 | Discharge: 2012-05-07 | DRG: 546 | Disposition: A | Discharge status: Home / Self Care | Payer: Medicaid - Out of State | Attending: Internal Medicine | Admitting: Internal Medicine

## 2012-05-05 ENCOUNTER — Emergency Department (HOSPITAL_COMMUNITY): Payer: Medicaid - Out of State

## 2012-05-05 DIAGNOSIS — Z9181 History of falling: Secondary | ICD-10-CM

## 2012-05-05 DIAGNOSIS — R509 Fever, unspecified: Secondary | ICD-10-CM

## 2012-05-05 DIAGNOSIS — R5383 Other fatigue: Secondary | ICD-10-CM | POA: Diagnosis present

## 2012-05-05 DIAGNOSIS — R531 Weakness: Secondary | ICD-10-CM | POA: Diagnosis present

## 2012-05-05 DIAGNOSIS — F1210 Cannabis abuse, uncomplicated: Secondary | ICD-10-CM | POA: Diagnosis present

## 2012-05-05 DIAGNOSIS — I959 Hypotension, unspecified: Secondary | ICD-10-CM | POA: Diagnosis present

## 2012-05-05 DIAGNOSIS — M329 Systemic lupus erythematosus, unspecified: Secondary | ICD-10-CM

## 2012-05-05 DIAGNOSIS — E871 Hypo-osmolality and hyponatremia: Secondary | ICD-10-CM | POA: Diagnosis present

## 2012-05-05 DIAGNOSIS — Z9119 Patient's noncompliance with other medical treatment and regimen: Secondary | ICD-10-CM

## 2012-05-05 DIAGNOSIS — D649 Anemia, unspecified: Secondary | ICD-10-CM | POA: Diagnosis present

## 2012-05-05 DIAGNOSIS — E876 Hypokalemia: Secondary | ICD-10-CM | POA: Diagnosis present

## 2012-05-05 DIAGNOSIS — D638 Anemia in other chronic diseases classified elsewhere: Secondary | ICD-10-CM | POA: Diagnosis present

## 2012-05-05 DIAGNOSIS — R112 Nausea with vomiting, unspecified: Secondary | ICD-10-CM | POA: Diagnosis present

## 2012-05-05 DIAGNOSIS — R5381 Other malaise: Secondary | ICD-10-CM | POA: Diagnosis present

## 2012-05-05 DIAGNOSIS — E860 Dehydration: Secondary | ICD-10-CM | POA: Diagnosis present

## 2012-05-05 DIAGNOSIS — Z91199 Patient's noncompliance with other medical treatment and regimen due to unspecified reason: Secondary | ICD-10-CM

## 2012-05-05 HISTORY — DX: Glomerular disease in systemic lupus erythematosus: M32.14

## 2012-05-05 HISTORY — DX: Other pericardial effusion (noninflammatory): I31.39

## 2012-05-05 HISTORY — DX: Pericardial effusion (noninflammatory): I31.3

## 2012-05-05 LAB — CBC
MCH: 28.5 pg (ref 26.0–34.0)
Platelets: 239 10*3/uL (ref 150–400)
RBC: 3.65 MIL/uL — ABNORMAL LOW (ref 3.87–5.11)
WBC: 6.2 10*3/uL (ref 4.0–10.5)

## 2012-05-05 LAB — DIFFERENTIAL
Eosinophils Relative: 0 % (ref 0–5)
Monocytes Absolute: 0.7 10*3/uL (ref 0.1–1.0)
Monocytes Relative: 11 % (ref 3–12)
Neutrophils Relative %: 57 % (ref 43–77)

## 2012-05-05 LAB — COMPREHENSIVE METABOLIC PANEL
ALT: 34 U/L (ref 0–35)
AST: 66 U/L — ABNORMAL HIGH (ref 0–37)
CO2: 18 mEq/L — ABNORMAL LOW (ref 19–32)
Calcium: 8.7 mg/dL (ref 8.4–10.5)
GFR calc non Af Amer: >90 mL/min (ref >90)
Sodium: 134 mEq/L — ABNORMAL LOW (ref 135–145)
Total Protein: 8.3 g/dL (ref 6.0–8.3)

## 2012-05-05 LAB — URINALYSIS, ROUTINE W REFLEX MICROSCOPIC
Bilirubin Urine: NEGATIVE
Specific Gravity, Urine: 1.009 (ref 1.005–1.030)
pH: 6.5 (ref 5.0–8.0)

## 2012-05-05 LAB — URINE MICROSCOPIC-ADD ON

## 2012-05-05 MED ORDER — ACETAMINOPHEN 650 MG RE SUPP: 650.0000 mg | Freq: Four times a day (QID) | RECTAL | Status: DC | PRN

## 2012-05-05 MED ORDER — ONDANSETRON HCL 4 MG/2ML IJ SOLN: 4.0000 mg | Freq: Three times a day (TID) | INTRAMUSCULAR | Status: DC | PRN

## 2012-05-05 MED ORDER — SODIUM CHLORIDE 0.9 % IV BOLUS (SEPSIS)
1000.0000 mL | Freq: Once | INTRAVENOUS | Status: AC
  Administered 2012-05-05: 1000 mL via INTRAVENOUS

## 2012-05-05 MED ORDER — AZATHIOPRINE 50 MG PO TABS
150.0000 mg | ORAL_TABLET | Freq: Every day | ORAL | Status: DC
  Administered 2012-05-06 – 2012-05-07 (×2): 150 mg via ORAL
  Filled 2012-05-05 (×2): qty 3

## 2012-05-05 MED ORDER — ALUM & MAG HYDROXIDE-SIMETH 200-200-20 MG/5ML PO SUSP
30.0000 mL | Freq: Four times a day (QID) | ORAL | Status: DC | PRN
  Administered 2012-05-06: 30 mL via ORAL
  Filled 2012-05-05: qty 30

## 2012-05-05 MED ORDER — SODIUM CHLORIDE 0.9 % IV SOLN
INTRAVENOUS | Status: DC
  Administered 2012-05-06 – 2012-05-07 (×4): via INTRAVENOUS

## 2012-05-05 MED ORDER — OXYCODONE HCL 5 MG PO TABS
5.0000 mg | ORAL_TABLET | ORAL | Status: DC | PRN
  Administered 2012-05-06 – 2012-05-07 (×3): 5 mg via ORAL
  Filled 2012-05-05 (×4): qty 1

## 2012-05-05 MED ORDER — SODIUM CHLORIDE 0.9 % IV SOLN: INTRAVENOUS | Status: DC

## 2012-05-05 MED ORDER — DEXAMETHASONE SODIUM PHOSPHATE 10 MG/ML IJ SOLN
10.0000 mg | Freq: Once | INTRAMUSCULAR | Status: AC
  Administered 2012-05-05: 10 mg via INTRAVENOUS
  Filled 2012-05-05: qty 1

## 2012-05-05 MED ORDER — VENLAFAXINE HCL ER 75 MG PO CP24
75.0000 mg | ORAL_CAPSULE | Freq: Every day | ORAL | Status: DC
  Administered 2012-05-06 – 2012-05-07 (×2): 75 mg via ORAL
  Filled 2012-05-05 (×4): qty 1

## 2012-05-05 MED ORDER — MORPHINE SULFATE 4 MG/ML IJ SOLN
4.0000 mg | Freq: Once | INTRAMUSCULAR | Status: AC
  Administered 2012-05-05: 4 mg via INTRAVENOUS
  Filled 2012-05-05: qty 1

## 2012-05-05 MED ORDER — HYDROXYCHLOROQUINE SULFATE 200 MG PO TABS
200.0000 mg | ORAL_TABLET | Freq: Two times a day (BID) | ORAL | Status: DC
  Administered 2012-05-06 – 2012-05-07 (×3): 200 mg via ORAL
  Filled 2012-05-05 (×4): qty 1

## 2012-05-05 MED ORDER — HYDROXYZINE PAMOATE 50 MG PO CAPS
50.0000 mg | ORAL_CAPSULE | Freq: Four times a day (QID) | ORAL | Status: DC | PRN
  Filled 2012-05-05: qty 1

## 2012-05-05 MED ORDER — ONDANSETRON HCL 4 MG/2ML IJ SOLN
4.0000 mg | Freq: Once | INTRAMUSCULAR | Status: AC
  Administered 2012-05-05: 4 mg via INTRAVENOUS
  Filled 2012-05-05: qty 2

## 2012-05-05 MED ORDER — DEXAMETHASONE SODIUM PHOSPHATE 4 MG/ML IJ SOLN
4.0000 mg | Freq: Two times a day (BID) | INTRAMUSCULAR | Status: DC
  Administered 2012-05-06: 4 mg via INTRAVENOUS
  Filled 2012-05-05 (×3): qty 1

## 2012-05-05 MED ORDER — SODIUM CHLORIDE 0.9 % IJ SOLN
3.0000 mL | Freq: Two times a day (BID) | INTRAMUSCULAR | Status: DC
  Administered 2012-05-06 – 2012-05-07 (×2): 3 mL via INTRAVENOUS

## 2012-05-05 MED ORDER — ACETAMINOPHEN 325 MG PO TABS
650.0000 mg | ORAL_TABLET | Freq: Four times a day (QID) | ORAL | Status: DC | PRN
  Administered 2012-05-06: 650 mg via ORAL
  Filled 2012-05-05: qty 2

## 2012-05-05 MED ORDER — SODIUM CHLORIDE 0.9 % IV SOLN
INTRAVENOUS | Status: DC
  Administered 2012-05-05: 21:00:00 via INTRAVENOUS

## 2012-05-05 MED ORDER — ONDANSETRON HCL 4 MG PO TABS: 4.0000 mg | ORAL_TABLET | Freq: Four times a day (QID) | ORAL | Status: DC | PRN

## 2012-05-05 MED ORDER — ONDANSETRON HCL 4 MG/2ML IJ SOLN
4.0000 mg | Freq: Four times a day (QID) | INTRAMUSCULAR | Status: DC | PRN
  Administered 2012-05-06 – 2012-05-07 (×3): 4 mg via INTRAVENOUS
  Filled 2012-05-05 (×3): qty 2

## 2012-05-05 MED ORDER — HYDROXYZINE HCL 50 MG PO TABS
50.0000 mg | ORAL_TABLET | Freq: Four times a day (QID) | ORAL | Status: DC | PRN
Start: 2012-05-05 — End: 2012-05-08
  Administered 2012-05-06 – 2012-05-07 (×3): 50 mg via ORAL
  Filled 2012-05-05 (×2): qty 1

## 2012-05-05 MED ORDER — MORPHINE SULFATE 2 MG/ML IJ SOLN
2.0000 mg | INTRAMUSCULAR | Status: DC | PRN
  Administered 2012-05-06 (×2): 2 mg via INTRAVENOUS
  Filled 2012-05-05 (×2): qty 1

## 2012-05-05 NOTE — ED Notes (Signed)
Pt reports n/v, fever, fall d/t leg weakness since Friday.  Pt also reports abd pain.

## 2012-05-05 NOTE — H&P (Addendum)
History and Physical  Evelyn [name] ZOX:[id number] DOB: 05/27/1981 DOA: 05/05/2012  Referring physician: Ivonne Andrew, PA//Kathleen Clarene Duke, MD PCP: No primary provider on file. None in [place]. Dr. Sharlotte Alamo, [place], [place] (Rheumatologist) Nephrologist: Dr. Consuelo Pandy? [place], [place]  Chief Complaint: Vomiting  HPI:  31 year old woman with a history of lupus presents the emergency room with a four-day history of nausea, vomiting, poor oral intake and generalized weakness. She has been unable to tolerate oral intake. Numerous episodes of vomiting a day. No abdominal pain except some cramping from vomiting. No sick contacts. She reports syncope and generalized weakness as well as falls. Fever up to 104 at home. Associated symptoms include rash over her arms, chest and abdomen, myalgias and headache.  She has been off all medications for the last month secondary to temporary loss of her insurance (now rectified). Previous medications included Imuran, Plaquenil and prednisone 20 mg daily.  In the emergency department the patient was noted be afebrile with normal respiratory rate and no hypoxia. Hypotensive with systolic blood pressure 82-105. Urinalysis, urine pregnancy and chest x-ray were negative. Chemistry panel revealed elevated AST (seen February 2013). CBC notable for chronic normocytic anemia, stable. No leukocytosis. She was given 10 mg IV Decadron, Zofran, morphine. Referred to the hospitalist service for admission for fever, vomiting, dehydration.  Review of Systems:  Negative for visual changes, sore throat, dysuria, bleeding.  Positive for intermittent chest pain and shortness of breath.  Past Medical History  Diagnosis Date  . Headache   . Anxiety   . Anemia   . Depression   . Lupus   . Lupus nephritis   . Pericardial effusion     Intermittent. Associated with lupus.   Past Surgical History  Procedure Date  . Dilation and evacuation 11/10  . Laparoscopic tubal  ligation 07/10/2011    Procedure: LAPAROSCOPIC TUBAL LIGATION;  Surgeon: Purcell Nails, MD;  Location: [facility];  Service: Gynecology;  Laterality: Bilateral;  fulguration   Social History:  reports that she quit smoking about 10 months ago. Her smoking use included Cigarettes. She quit after 7 years of use. She does not have any smokeless tobacco history on file. She reports that she uses illicit drugs (Marijuana) about 7 times per week. She reports that she does not drink alcohol.  Allergies  Allergen Reactions  . Ciprofloxacin Other (See Comments)    UNKNOWN  . Metoclopramide Hcl     Body started contracting, couldn't breathe, throat swelled up  . Nsaids     Flares up her lupus  . Sulfur     Flares up her lupus   Family History  Problem Relation Age of Onset  . Hypertension Mother    Prior to Admission medications   Medication Sig Start Date End Date Taking? Authorizing Provider  acetaminophen (TYLENOL) 325 MG tablet Take 650 mg by mouth every 6 (six) hours as needed. Fever   Yes Historical Provider, MD  azaTHIOprine (IMURAN) 50 MG tablet Take 150 mg by mouth daily. Pt takes 3 tabs daily   Yes Historical Provider, MD  azaTHIOprine (IMURAN) 50 MG tablet Take 3 tablets (150 mg total) by mouth daily. 02/12/12 02/11/13 Yes Catherine E Schinlever, PA  desvenlafaxine (PRISTIQ) 50 MG 24 hr tablet Take 1 tablet (50 mg total) by mouth daily. 02/12/12 02/11/13 Yes Catherine E Schinlever, PA  gabapentin (NEURONTIN) 800 MG tablet Take 800 mg by mouth 3 (three) times daily.     Yes Historical Provider, MD  hydroxychloroquine (PLAQUENIL) 200 MG tablet Take  200 mg by mouth 2 (two) times daily.     Yes Historical Provider, MD  hydroxychloroquine (PLAQUENIL) 200 MG tablet Take 1 tablet (200 mg total) by mouth 2 (two) times daily. 02/12/12 02/11/13 Yes Catherine E Schinlever, PA  hydrOXYzine (VISTARIL) 50 MG capsule Take 50 mg by mouth at bedtime as needed. For itching   Yes Historical Provider, MD    ondansetron (ZOFRAN-ODT) 8 MG disintegrating tablet Take 8 mg by mouth every 8 (eight) hours as needed. nausea    Yes Historical Provider, MD  predniSONE (DELTASONE) 20 MG tablet Take 40 mg by mouth daily.    Yes Historical Provider, MD   Physical Exam: Filed Vitals:   05/05/12 1836 05/05/12 1904 05/05/12 1958  BP: 82/50 105/63 95/46  Pulse: 112 71 97  Temp: 99.5 F (37.5 C)    TempSrc: Oral    Resp: 16  20  Weight: 60.328 kg (133 lb)    SpO2: 100% 100% 100%    General:  Appears calm and mildly uncomfortable. Examined in the emergency department. Nontoxic. Appears weak.  Eyes: Pupils round and equal. Irises appear unremarkable.  ENT: Grossly normal hearing. Lips and tongue appear unremarkable. Dentition fair.  Neck: No lymphadenopathy or masses. No thyromegaly.  Cardiovascular: Regular rate and rhythm. No murmur, rub, gallop. No lower extremity edema.  Respiratory: Clear to auscultation bilaterally. No wheezes, rales, rhonchi. Normal respiratory effort.  Abdomen: Soft, nontender, nondistended.  Skin: Diffuse rash face, neck, bilateral arms, chest and abdomen. Thick, lichenified. No erythema or edema.  Musculoskeletal: Globally weak 4/5 upper and lower extremities bilaterally. No focal motor deficit.  Psychiatric: Grossly normal mood and affect. Speech fluent and appropriate. Appears to be a fairly good historian. Taking an active interest in the current news story in regard to 3 women in [place] and spontaneously  tells me about this. Mentation appears normal.  Neurologic: Appears grossly unremarkable.  Labs on Admission:  Basic Metabolic Panel:  Lab 05/05/12 1610  NA 134*  K 3.3*  CL 99  CO2 18*  GLUCOSE 94  BUN 8  CREATININE 0.76  CALCIUM 8.7  MG --  PHOS --   Liver Function Tests:  Lab 05/05/12 1945  AST 66*  ALT 34  ALKPHOS 57  BILITOT 0.5  PROT 8.3  ALBUMIN 3.6   CBC:  Lab 05/05/12 1945  WBC 6.2  NEUTROABS 3.5  HGB 10.4*  HCT 32.2*  MCV 88.2   PLT 239   Radiological Exams on Admission: Dg Chest 2 View  05/05/2012  *RADIOLOGY REPORT*  Clinical Data: 31 year old female with fever.  History of lupus, immunocompromised.  CHEST - 2 VIEW  Comparison: 08/15/2011 and earlier.  Findings: AP and lateral views of the chest.  Lung volumes within normal limits.  Allowing for AP technique,  Cardiac size and mediastinal contours are within normal limits.  Visualized tracheal air column is within normal limits.  No pneumothorax, pulmonary edema, pleural effusion or confluent pulmonary opacity.  No acute osseous abnormality identified.  IMPRESSION: No acute cardiopulmonary abnormality.  Original Report Authenticated By: Harley Hallmark, M.D.   EKG: Independently reviewed. Sinus rhythm. Prolonged QT. Prolonged QT seen on EKG 12/06/2011.  Assessment/Plan 31 year old woman with a history of lupus and noncompliance, off all medications for the last month presents with fever, hypotension, nausea, vomiting, generalized weakness. Suspect signs and symptoms related to untreated lupus.  1. Fever/hypotension: Etiology unclear. Favor lupus flare in context of noncompliance with medications. Urinalysis and chest x-ray negative. Abdominal exam benign. Doubt  acute surgical or bacterial intra-abdominal process. Differential includes viral gastroenteritis, relative adrenal insufficiency. Check serum cortisol. Steroids--Decadron for now. If cortisol is low then can change to hydrocortisone. If cortisol normal can likely change to prednisone next one to 2 days. Sepsis doubted based on examination. 2. Nausea, vomiting: Differential as above. Supportive care, IV fluids, steroids. 3. Dehydration: I suspect her syncope and falls are related to dehydration and orthostasis. Check orthostatics in the morning. IV fluids. 4. Generalized weakness: Suspect will improve with IV fluids and steroid treatment. 5. Hypokalemia: Replete. 6. Lupus: No signs of nephritis at this point and no  signs or symptoms of pericardial effusion. Treatment as above. Close outpatient followup. Fortunately she now has insurance again. 7. Normocytic anemia: Stable. Likely anemia of chronic disease. 8. Mild AST elevation: Etiology and significance unclear. 9. Noncompliance: Hopefully resolved with reinstated insurance. 10. Marijuana abuse  Code Status: Full code Family Communication: None at bedside. Disposition Plan: Pending further evaluation and treatment.  Brendia Sacks, MD  [facility] Pager [phone] 05/05/2012, 9:34 PM

## 2012-05-05 NOTE — ED Notes (Signed)
Attempted to call report to floor, RN unavailable, states she will call me back soon for report

## 2012-05-05 NOTE — ED Provider Notes (Signed)
Evelyn [name] S 8:30PM Pt discussed in sign out with Josh Geiple PA-C.  Pt with hx of lupus formally on daily steroids presents with complaints of fever, rash, N/V.  Pt with slightly low BP and signs for dehydration.  Pt also initially tachycardic.  Pt improving with IV fluids.  Pt has some follow up with specialists in [facility] [place] but follow up is poor.  No local PCP.  No source for infection found yet.  Blood cultures sent.  Plan for possible admission and obs for night.  Spoke with tried hospitalist. They will see patient and admit for observation on telemetry bed. Under team 2.    Angus Seller, [place] 05/05/12 2154

## 2012-05-05 NOTE — ED Provider Notes (Signed)
History     CSN: [id number]  Arrival date & time 05/05/12  1478   First MD Initiated Contact with Patient 05/05/12 1849      Chief Complaint  Patient presents with  . Nausea  . Emesis  . Fall  . Hypotension    (Consider location/radiation/quality/duration/timing/severity/associated sxs/prior treatment) HPI Comments: 31 yo female with history of lupus, on multiple immunosuppressant medications -- presents to the ED for 3 days of fever, vomiting, headache, body aches and rash.  Patients states that she has "vomited enough to fill a trash bag."  She reports lightheadedness and when she stands her legs give out. Denies LOC. Reports fever up to 104F.  Rash is on her arms, trunk, neck, face and spares the legs.  Rash is itchy.  Patient has tried tylenol without relief of symptoms.  No diarrhea, hematemesis, abdominal pain. Patient states she has not taken her prednisone for the past several days due to vomiting. Denies tampon use. LMP last December.   Patient is a 31 y.o. female presenting with vomiting and fall. The history is provided by the patient.  Emesis  This is a new problem. The current episode started more than 2 days ago. The problem occurs 5 to 10 times per day. The problem has not changed since onset.The emesis has an appearance of stomach contents. The fever has been present for 3 to 4 days. Associated symptoms include abdominal pain (Diffuse), chills, a fever, headaches and myalgias. Pertinent negatives include no cough, no diarrhea and no URI.  Fall Associated symptoms include a fever, abdominal pain (Diffuse), nausea, vomiting and headaches.    Past Medical History  Diagnosis Date  . Headache   . Anxiety   . Anemia   . Depression   . Lupus     Past Surgical History  Procedure Date  . Dilation and evacuation 11/10  . Laparoscopic tubal ligation 07/10/2011    Procedure: LAPAROSCOPIC TUBAL LIGATION;  Surgeon: Purcell Nails, MD;  Location: [facility];  Service: Gynecology;   Laterality: Bilateral;  fulguration    No family history on file.  History  Substance Use Topics  . Smoking status: Former Smoker -- 7 years    Types: Cigarettes    Quit date: 06/20/2011  . Smokeless tobacco: Not on file  . Alcohol Use: No    OB History    Grav Para Term Preterm Abortions TAB SAB Ect Mult Living                  Review of Systems  Constitutional: Positive for fever, chills and fatigue.  HENT: Negative for sore throat and rhinorrhea.   Eyes: Negative for redness.  Respiratory: Negative for cough and shortness of breath.   Cardiovascular: Negative for chest pain.  Gastrointestinal: Positive for nausea, vomiting and abdominal pain (Diffuse). Negative for diarrhea.  Genitourinary: Negative for dysuria.  Musculoskeletal: Positive for myalgias.  Skin: Positive for rash.  Neurological: Positive for light-headedness and headaches. Negative for dizziness, syncope and weakness.    Allergies  Ciprofloxacin; Metoclopramide hcl; Nsaids; and Sulfur  Home Medications   Current Outpatient Rx  Name Route Sig Dispense Refill  . ACETAMINOPHEN 325 MG PO TABS Oral Take 650 mg by mouth every 6 (six) hours as needed. Fever    . AZATHIOPRINE 50 MG PO TABS Oral Take 150 mg by mouth daily. Pt takes 3 tabs daily    . AZATHIOPRINE 50 MG PO TABS Oral Take 3 tablets (150 mg total) by mouth daily.  90 tablet 0  . DESVENLAFAXINE SUCCINATE ER 50 MG PO TB24 Oral Take 1 tablet (50 mg total) by mouth daily. 30 tablet 0  . GABAPENTIN 800 MG PO TABS Oral Take 800 mg by mouth 3 (three) times daily.      Marland Kitchen HYDROXYCHLOROQUINE SULFATE 200 MG PO TABS Oral Take 200 mg by mouth 2 (two) times daily.      Marland Kitchen HYDROXYCHLOROQUINE SULFATE 200 MG PO TABS Oral Take 1 tablet (200 mg total) by mouth 2 (two) times daily. 60 tablet 0  . HYDROXYZINE PAMOATE 50 MG PO CAPS Oral Take 50 mg by mouth at bedtime as needed. For itching    . ONDANSETRON 8 MG PO TBDP Oral Take 8 mg by mouth every 8 (eight) hours as  needed. nausea     . PREDNISONE 20 MG PO TABS Oral Take 40 mg by mouth daily.       BP 105/63  Pulse 71  Temp(Src) 99.5 F (37.5 C) (Oral)  Resp 16  Wt 133 lb (60.328 kg)  SpO2 100%  LMP 12/06/2011  Physical Exam  Nursing note and vitals reviewed. Constitutional: She is oriented to person, place, and time. She appears well-developed and well-nourished.  HENT:  Head: Normocephalic and atraumatic.  Right Ear: Tympanic membrane, external ear and ear canal normal.  Left Ear: Tympanic membrane, external ear and ear canal normal.  Nose: Nose normal.  Mouth/Throat: Oropharynx is clear and moist. Mucous membranes are dry (Lips cracked).  Eyes: Conjunctivae are normal. Pupils are equal, round, and reactive to light. Right eye exhibits no discharge. Left eye exhibits no discharge.  Neck: Normal range of motion. Neck supple. No JVD present.  Cardiovascular: Regular rhythm and normal heart sounds.  Tachycardia present.   No murmur heard. Pulmonary/Chest: Effort normal and breath sounds normal. No respiratory distress. She has no wheezes. She has no rales.  Abdominal: Soft. Bowel sounds are normal. There is no tenderness. There is no rebound and no guarding.  Musculoskeletal: Normal range of motion. She exhibits no edema and no tenderness.  Lymphadenopathy:    She has no cervical adenopathy.  Neurological: She is alert and oriented to person, place, and time.  Skin: Skin is warm and dry.       Hyperpigmented scaly rash on ears, face, neck, upper chest, bilateral arms.   Psychiatric: She has a normal mood and affect.    ED Course  Procedures (including critical care time)  Labs Reviewed  CBC - Abnormal; Notable for the following:    RBC 3.65 (*)    Hemoglobin 10.4 (*)    HCT 32.2 (*)    All other components within normal limits  DIFFERENTIAL - Abnormal; Notable for the following:    Basophils Relative 2 (*)    All other components within normal limits  COMPREHENSIVE METABOLIC PANEL  - Abnormal; Notable for the following:    Sodium 134 (*)    Potassium 3.3 (*)    CO2 18 (*)    AST 66 (*)    All other components within normal limits  URINALYSIS, ROUTINE W REFLEX MICROSCOPIC - Abnormal; Notable for the following:    Hgb urine dipstick SMALL (*)    Ketones, ur 15 (*)    Protein, ur >300 (*)    All other components within normal limits  URINE MICROSCOPIC-ADD ON - Abnormal; Notable for the following:    Casts HYALINE CASTS (*)    All other components within normal limits  COMPREHENSIVE METABOLIC PANEL -  Abnormal; Notable for the following:    Sodium 134 (*)    Glucose, Bld 180 (*)    Calcium 8.3 (*)    Albumin 3.2 (*)    AST 53 (*)    All other components within normal limits  POCT PREGNANCY, URINE  CORTISOL  CULTURE, BLOOD (ROUTINE X 2)  CULTURE, BLOOD (ROUTINE X 2)  SEDIMENTATION RATE   Dg Chest 2 View  05/05/2012  *RADIOLOGY REPORT*  Clinical Data: 31 year old female with fever.  History of lupus, immunocompromised.  CHEST - 2 VIEW  Comparison: 08/15/2011 and earlier.  Findings: AP and lateral views of the chest.  Lung volumes within normal limits.  Allowing for AP technique,  Cardiac size and mediastinal contours are within normal limits.  Visualized tracheal air column is within normal limits.  No pneumothorax, pulmonary edema, pleural effusion or confluent pulmonary opacity.  No acute osseous abnormality identified.  IMPRESSION: No acute cardiopulmonary abnormality.  Original Report Authenticated By: Ulla Potash III, M.D.     1. Nausea and vomiting   2. Fever   3. Hypotension     Patient seen and examined. Work-up initiated. Medications ordered. Discussed with Dr. Clarene Duke.   Vital signs reviewed and are as follows: Filed Vitals:   05/05/12 1958  BP: 95/46  Pulse: 97  Temp:   Resp: 20  BP 95/46  Pulse 97  Temp(Src) 99.5 F (37.5 C) (Oral)  Resp 20  Wt 133 lb (60.328 kg)  SpO2 100%  LMP 12/06/2011  8:25 PM Patient re-evaluated. Nausea  continues. Pending work-up. Handoff Dammen PA-C who will follow results and admit.     MDM  Work-up pending. Will need admission for fever in setting of immunosuppression, dehydration.         Renne Crigler, [place] 05/06/12 1521

## 2012-05-06 DIAGNOSIS — M329 Systemic lupus erythematosus, unspecified: Secondary | ICD-10-CM

## 2012-05-06 DIAGNOSIS — R509 Fever, unspecified: Secondary | ICD-10-CM

## 2012-05-06 DIAGNOSIS — I959 Hypotension, unspecified: Secondary | ICD-10-CM

## 2012-05-06 DIAGNOSIS — R112 Nausea with vomiting, unspecified: Secondary | ICD-10-CM

## 2012-05-06 LAB — SEDIMENTATION RATE: Sed Rate: 115 mm/hr — ABNORMAL HIGH (ref 0–22)

## 2012-05-06 LAB — COMPREHENSIVE METABOLIC PANEL
Albumin: 3.2 g/dL — ABNORMAL LOW (ref 3.5–5.2)
BUN: 6 mg/dL (ref 6–23)
Chloride: 103 mEq/L (ref 96–112)
Creatinine, Ser: 0.58 mg/dL (ref 0.50–1.10)
GFR calc non Af Amer: >90 mL/min (ref >90)
Total Bilirubin: 0.3 mg/dL (ref 0.3–1.2)

## 2012-05-06 LAB — CORTISOL: Cortisol, Plasma: 11.1 ug/dL

## 2012-05-06 MED ORDER — ZOLPIDEM TARTRATE 5 MG PO TABS
5.0000 mg | ORAL_TABLET | Freq: Every evening | ORAL | Status: DC | PRN
  Administered 2012-05-06 – 2012-05-07 (×2): 5 mg via ORAL
  Filled 2012-05-06 (×2): qty 1

## 2012-05-06 MED ORDER — MORPHINE SULFATE 2 MG/ML IJ SOLN
2.0000 mg | INTRAMUSCULAR | Status: DC | PRN
  Administered 2012-05-06: 2 mg via INTRAVENOUS
  Filled 2012-05-06: qty 1

## 2012-05-06 MED ORDER — HYDROMORPHONE HCL PF 1 MG/ML IJ SOLN
1.0000 mg | INTRAMUSCULAR | Status: DC | PRN
  Administered 2012-05-06 – 2012-05-07 (×9): 1 mg via INTRAVENOUS
  Filled 2012-05-06 (×9): qty 1

## 2012-05-06 MED ORDER — PANTOPRAZOLE SODIUM 40 MG IV SOLR
40.0000 mg | Freq: Every day | INTRAVENOUS | Status: DC
  Administered 2012-05-06: 40 mg via INTRAVENOUS
  Filled 2012-05-06 (×2): qty 40

## 2012-05-06 MED ORDER — METHYLPREDNISOLONE SODIUM SUCC 40 MG IJ SOLR
40.0000 mg | Freq: Three times a day (TID) | INTRAMUSCULAR | Status: DC
  Administered 2012-05-06 – 2012-05-07 (×4): 40 mg via INTRAVENOUS
  Filled 2012-05-06 (×6): qty 1

## 2012-05-06 NOTE — Progress Notes (Signed)
UR completed 

## 2012-05-06 NOTE — ED Provider Notes (Signed)
Medical screening examination/treatment/procedure(s) were performed by non-physician practitioner and as supervising physician I was immediately available for consultation/collaboration.   Laray Anger, DO 05/06/12 1122

## 2012-05-06 NOTE — ED Provider Notes (Signed)
Medical screening examination/treatment/procedure(s) were performed by non-physician practitioner and as supervising physician I was immediately available for consultation/collaboration.   Laray Anger, DO 05/06/12 [phone]

## 2012-05-06 NOTE — Progress Notes (Signed)
Subjective:   Chart reviewed. She feels slightly better with improvement in her nausea and vomiting. She is asking for some solid food. She complains of generalized pains in her hands, lower back and legs but denies joint pains. She has a chronic skin rash on her face, neck, limbs and lower abdomen but indicates that there are some new rashes. Pruritus.  Review of systems: Denies chest pain, dyspnea or cough. No diarrhea. No dysuria or urinary frequency.  She last saw a rheumatologist approximately 2 months ago and was discharged from the clinic due to no-shows. She has not taken any of her medications for > 1 month.  Objective  Vital signs in last 24 hours: Filed Vitals:   05/06/12 0809 05/06/12 0811 05/06/12 0812 05/06/12 1423  BP: 118/80 108/75 119/84 109/67  Pulse: 77 86 88 95  Temp: 97.1 F (36.2 C)   98.2 F (36.8 C)  TempSrc: Oral   Oral  Resp: 20 20 20 20   Height:      Weight:      SpO2: 100% 98% 100% 99%   Weight change:   Intake/Output Summary (Last 24 hours) at 05/06/12 1631 Last data filed at 05/06/12 1616  Gross per 24 hour  Intake 1808.67 ml  Output   1425 ml  Net 383.67 ml    Physical Exam:  General Exam: Comfortable. Not toxic looking. Respiratory System: Clear. No increased work of breathing.  Cardiovascular System: First and second heart sounds heard. Regular rate and rhythm. No JVD/murmurs.  Gastrointestinal System: Abdomen is non distended, soft and normal bowel sounds heard.  Central Nervous System: Alert and oriented. No focal neurological deficits. Skin: Extensive, hyperpigmented, lichenified skin rash on her for head, front and back of her neck, anterior chest, limbs and lower abdomen.? Some erythematous areas on chest but these are probably from scratching. Extremities: Symmetrical 5 x 5 power. No acute findings in the small joints of her hands.  Labs:  Basic Metabolic Panel:  Lab 05/06/12 1610 05/05/12 1945  NA 134* 134*  K 3.5 3.3*  CL 103  99  CO2 20 18*  GLUCOSE 180* 94  BUN 6 8  CREATININE 0.58 0.76  CALCIUM 8.3* 8.7  ALB -- --  PHOS -- --   Liver Function Tests:  Lab 05/06/12 0515 05/05/12 1945  AST 53* 66*  ALT 33 34  ALKPHOS 53 57  BILITOT 0.3 0.5  PROT 7.8 8.3  ALBUMIN 3.2* 3.6   No results found for this basename: LIPASE:3,AMYLASE:3 in the last 168 hours No results found for this basename: AMMONIA:3 in the last 168 hours CBC:  Lab 05/05/12 1945  WBC 6.2  NEUTROABS 3.5  HGB 10.4*  HCT 32.2*  MCV 88.2  PLT 239   Cardiac Enzymes: No results found for this basename: CKTOTAL:5,CKMB:5,CKMBINDEX:5,TROPONINI:5 in the last 168 hours CBG: No results found for this basename: GLUCAP:5 in the last 168 hours  Urine pregnancy test: Negative. Blood cultures x2: Pending  Iron Studies: No results found for this basename: IRON,TIBC,TRANSFERRIN,FERRITIN in the last 72 hours Studies/Results: Dg Chest 2 View  05/05/2012  *RADIOLOGY REPORT*  Clinical Data: 31 year old female with fever.  History of lupus, immunocompromised.  CHEST - 2 VIEW  Comparison: 08/15/2011 and earlier.  Findings: AP and lateral views of the chest.  Lung volumes within normal limits.  Allowing for AP technique,  Cardiac size and mediastinal contours are within normal limits.  Visualized tracheal air column is within normal limits.  No pneumothorax, pulmonary edema, pleural effusion  or confluent pulmonary opacity.  No acute osseous abnormality identified.  IMPRESSION: No acute cardiopulmonary abnormality.  Original Report Authenticated By: Harley Hallmark, M.D.   Medications:    . sodium chloride 125 mL/hr at 05/06/12 0054  . DISCONTD: sodium chloride 125 mL/hr at 05/05/12 2033  . DISCONTD: sodium chloride        . azaTHIOprine  150 mg Oral Daily  . dexamethasone  10 mg Intravenous Once  . hydroxychloroquine  200 mg Oral BID  . methylPREDNISolone (SOLU-MEDROL) injection  40 mg Intravenous Q8H  .  morphine injection  4 mg Intravenous Once  .  ondansetron  4 mg Intravenous Once  . pantoprazole (PROTONIX) IV  40 mg Intravenous Q1200  . sodium chloride  1,000 mL Intravenous Once  . sodium chloride  1,000 mL Intravenous Once  . sodium chloride  3 mL Intravenous Q12H  . venlafaxine XR  75 mg Oral Q breakfast  . DISCONTD: sodium chloride   Intravenous STAT  . DISCONTD: dexamethasone  4 mg Intravenous Q12H    I  have reviewed scheduled and prn medications.     Problem/Plan: Principal Problem:  *Hypotension Active Problems:  Fever  Nausea & vomiting  Dehydration  Generalized weakness  Lupus  Anemia  1. Nausea, vomiting and poor oral intake: Unclear etiology. Patient has had similar presentations in the past. Not sure if this is do to lupus flare. Patient is tolerating liquid diet and is requesting solid food. We'll advance diet as tolerated. 2. Febrile illness: Patient does not look toxic. No obvious source of infection at this time. Patient seems to have defervesced and hospital.? Secondary to lupus flare. Monitor. 3. Possible lupus flare: In the context of noncompliance. Discussed with rheumatologist who had seen her in the past, who indicates that if there is no clinical source of sepsis then the fever could be attributed to lupus flare and recommends changing the Decadron to IV Solu-Medrol 40 mg every 8 hourly for a day or 2. When she is better, transition to oral prednisone 40 mg daily and taper gradually to 10 mg daily maintenance, continue Azathioprine and Plaquenil. She has to followup with her rheumatologist as an outpatient closely (the rheumatologist that had seen her has discharged her from his clinic due to no shows and she has to find a new one). ESR is 115. 4. Hypotension: Secondary to dehydration -Improved. Continue IV fluids and steroids. Random cortisol was 11.1. 5. Hypokalemia: Improved. 6. Normocytic anemia: Possibly secondary to chronic disease from her lupus. Seems stable. 7. Medication noncompliance and  noncompliance with M.D. followups. Counseled patient regarding compliance with these. 8. Substance abuse: 9. History of falls and a syncopal episode: Possibly secondary to hypotension and generalized weakness. 10. Mild hyponatremia: Secondary to dehydration. Followup BMP tomorrow.  Mansfield Dann 05/06/2012,4:31 PM  LOS: 1 day

## 2012-05-07 DIAGNOSIS — I959 Hypotension, unspecified: Secondary | ICD-10-CM

## 2012-05-07 DIAGNOSIS — R112 Nausea with vomiting, unspecified: Secondary | ICD-10-CM

## 2012-05-07 DIAGNOSIS — R509 Fever, unspecified: Secondary | ICD-10-CM

## 2012-05-07 DIAGNOSIS — M329 Systemic lupus erythematosus, unspecified: Secondary | ICD-10-CM

## 2012-05-07 LAB — BASIC METABOLIC PANEL
GFR calc Af Amer: >90 mL/min (ref >90)
GFR calc non Af Amer: >90 mL/min (ref >90)
Potassium: 4.6 mEq/L (ref 3.5–5.1)
Sodium: 133 mEq/L — ABNORMAL LOW (ref 135–145)

## 2012-05-07 LAB — CBC
Hemoglobin: 9.2 g/dL — ABNORMAL LOW (ref 12.0–15.0)
MCHC: 31.3 g/dL (ref 30.0–36.0)
Platelets: 230 10*3/uL (ref 150–400)
RDW: 13.7 % (ref 11.5–15.5)

## 2012-05-07 LAB — C-REACTIVE PROTEIN: CRP: 0.12 mg/dL — ABNORMAL LOW (ref <0.60)

## 2012-05-07 MED ORDER — HYDROXYCHLOROQUINE SULFATE 200 MG PO TABS: 200.0000 mg | ORAL_TABLET | Freq: Two times a day (BID) | ORAL | Status: DC

## 2012-05-07 MED ORDER — PREDNISONE (PAK) 10 MG PO TABS: 10.0000 mg | ORAL_TABLET | Freq: Every day | ORAL | Status: DC

## 2012-05-07 MED ORDER — AZATHIOPRINE 50 MG PO TABS: 150.0000 mg | ORAL_TABLET | Freq: Every day | ORAL | Status: DC

## 2012-05-07 MED ORDER — PANTOPRAZOLE SODIUM 40 MG PO TBEC
40.0000 mg | DELAYED_RELEASE_TABLET | Freq: Every day | ORAL | Status: DC
  Administered 2012-05-07: 40 mg via ORAL
  Filled 2012-05-07: qty 1

## 2012-05-07 MED ORDER — PREDNISONE (PAK) 10 MG PO TABS: 10.0000 mg | ORAL_TABLET | Freq: Every day | ORAL | Status: AC

## 2012-05-07 NOTE — Discharge Summary (Signed)
Physician Discharge Summary  Patient ID: Evelyn [name] MRN: [id number] DOB/AGE: [birth year] 31 y.o.  Admit date: 05/05/2012 Discharge date: 05/07/2012  Primary Care Physician:  No primary provider on file.   Discharge Diagnoses:    Principal Problem:  *Hypotension Active Problems:  Fever  Nausea & vomiting  Dehydration  Generalized weakness  Lupus  Anemia    Medication List  As of 05/07/2012  3:35 PM   STOP taking these medications         acetaminophen 325 MG tablet      ondansetron 8 MG disintegrating tablet      predniSONE 20 MG tablet         TAKE these medications         azaTHIOprine 50 MG tablet   Commonly known as: IMURAN   Take 3 tablets (150 mg total) by mouth daily.      desvenlafaxine 50 MG 24 hr tablet   Commonly known as: PRISTIQ   Take 1 tablet (50 mg total) by mouth daily.      gabapentin 800 MG tablet   Commonly known as: NEURONTIN   Take 800 mg by mouth 3 (three) times daily.      hydroxychloroquine 200 MG tablet   Commonly known as: PLAQUENIL   Take 1 tablet (200 mg total) by mouth 2 (two) times daily.      hydrOXYzine 50 MG capsule   Commonly known as: VISTARIL   Take 50 mg by mouth at bedtime as needed. For itching      predniSONE 10 MG tablet   Commonly known as: STERAPRED UNI-PAK   Take 1 tablet (10 mg total) by mouth daily. 40 mg daily for 1 week, 30 mg daily for 1 week, 20 mg daily for 1 week, then10 mg daily             Disposition and Follow-up:  Will be discharged home today in stable and improved condition. Needs to followup with Dr. Rozetta Nunnery in [place], [place] (rheumatologist).  Consults:  None    Significant Diagnostic Studies:  Dg Chest 2 View  05/05/2012  *RADIOLOGY REPORT*  Clinical Data: 31 year old female with fever.  History of lupus, immunocompromised.  CHEST - 2 VIEW  Comparison: 08/15/2011 and earlier.  Findings: AP and lateral views of the chest.  Lung volumes within normal limits.  Allowing for AP  technique,  Cardiac size and mediastinal contours are within normal limits.  Visualized tracheal air column is within normal limits.  No pneumothorax, pulmonary edema, pleural effusion or confluent pulmonary opacity.  No acute osseous abnormality identified.  IMPRESSION: No acute cardiopulmonary abnormality.  Original Report Authenticated By: Harley Hallmark, M.D.    Brief H and P: For complete details please refer to admission H and P, but in brief patient is a 31 year old woman with a history of lupus presents the emergency room with a four-day history of nausea, vomiting, poor oral intake and generalized weakness. She has been unable to tolerate oral intake. Numerous episodes of vomiting a day. No abdominal pain except some cramping from vomiting. No sick contacts. She reports syncope and generalized weakness as well as falls. Fever up to 104 at home. Associated symptoms include rash over her arms, chest and abdomen, myalgias and headache.  She has been off all medications for the last month secondary to temporary loss of her insurance (now rectified). Previous medications included Imuran, Plaquenil and prednisone 20 mg daily.  In the emergency department the patient was noted be afebrile with  normal respiratory rate and no hypoxia. Hypotensive with systolic blood pressure 82-105. Urinalysis, urine pregnancy and chest x-ray were negative.We were asked to admit her for further evaluation and management.      Hospital Course:  Principal Problem:  *Hypotension Active Problems:  Fever  Nausea & vomiting  Dehydration  Generalized weakness  Lupus  Anemia   #1 Lupus Flare: We suspect that her fever, hypotension, n/v were all related to a lupus flare-up due to a lapse in her medications (she had not been taking her meds for at least 1 month 2/2 lack of insurance, now her medicaid has been reinstated). All her symptoms have resolved. We have spoken with Dr. Kellie Simmering, rheumatologist, who recommends  prednisone at 40 mg with a slow taper and to keep at 10 mg in addition to restarting her plaquenil and azathioprine. She has been instructed to followup with her rheumatologist soon after DC. All her symptoms have resolved, she is afebrile and her BP in now stable.  She has been deemed medically ready for DC home today.  Time spent on Discharge: Greater than 30 minutes.  SignedChaya Jan [facility] Pager: [phone] 05/07/2012, 3:35 PM

## 2012-05-07 NOTE — Progress Notes (Signed)
Pt discharged to home per dayshift nurse. Discharge paperwork signed by patient and placed to chart. Medication RX given to patient.  IV lines removed and personal items taken to car by family member. Pt given wheelchair escort to  Main entrance of hospital. No c/o of pain or discomfort at this time.

## 2012-05-07 NOTE — Progress Notes (Signed)
The patient is receiving Protonix by the intravenous route. Based on criteria approved by the Pharmacy and Therapeutics Committee and the Medical Executive Committee, the medication is being converted to the equivalent oral dose form.   These criteria include:  -No Active GI bleeding  -Able to tolerate diet of full liquids (or better) or tube feeding  -Able to tolerate other medications by the oral or enteral route   If you have any questions about this conversion, please contact the Pharmacy Department ([phone]). Thank you.

## 2012-05-12 LAB — CULTURE, BLOOD (ROUTINE X 2)
Culture  Setup Time: 201305070206
Culture: NO GROWTH

## 2012-06-13 ENCOUNTER — Emergency Department (HOSPITAL_COMMUNITY): Payer: Medicaid - Out of State

## 2012-06-13 ENCOUNTER — Emergency Department (HOSPITAL_COMMUNITY)
Admission: EM | Admit: 2012-06-13 | Discharge: 2012-06-13 | Disposition: A | Discharge status: Home / Self Care | Payer: Medicaid - Out of State | Attending: Emergency Medicine | Admitting: Emergency Medicine

## 2012-06-13 ENCOUNTER — Encounter (HOSPITAL_COMMUNITY): Payer: Self-pay | Admitting: *Deleted

## 2012-06-13 DIAGNOSIS — F17200 Nicotine dependence, unspecified, uncomplicated: Secondary | ICD-10-CM | POA: Insufficient documentation

## 2012-06-13 DIAGNOSIS — M542 Cervicalgia: Secondary | ICD-10-CM | POA: Insufficient documentation

## 2012-06-13 DIAGNOSIS — Z79899 Other long term (current) drug therapy: Secondary | ICD-10-CM | POA: Insufficient documentation

## 2012-06-13 DIAGNOSIS — M329 Systemic lupus erythematosus, unspecified: Secondary | ICD-10-CM | POA: Insufficient documentation

## 2012-06-13 DIAGNOSIS — H9319 Tinnitus, unspecified ear: Secondary | ICD-10-CM | POA: Insufficient documentation

## 2012-06-13 DIAGNOSIS — S0990XA Unspecified injury of head, initial encounter: Secondary | ICD-10-CM | POA: Insufficient documentation

## 2012-06-13 DIAGNOSIS — Y92410 Unspecified street and highway as the place of occurrence of the external cause: Secondary | ICD-10-CM | POA: Insufficient documentation

## 2012-06-13 DIAGNOSIS — F341 Dysthymic disorder: Secondary | ICD-10-CM | POA: Insufficient documentation

## 2012-06-13 MED ORDER — OXYCODONE-ACETAMINOPHEN 5-325 MG PO TABS
2.0000 | ORAL_TABLET | Freq: Once | ORAL | Status: AC
  Administered 2012-06-13: 2 via ORAL
  Filled 2012-06-13: qty 2
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
## 2012-06-13 NOTE — Discharge Instructions (Signed)
Cryotherapy Cryotherapy means treatment with cold. Ice or gel packs can be used to reduce both pain and swelling. Ice is the most helpful within the first 24 to 48 hours after an injury or flareup from overusing a muscle or joint. Sprains, strains, spasms, burning pain, shooting pain, and aches can all be eased with ice. Ice can also be used when recovering from surgery. Ice is effective, has very few side effects, and is safe for most people to use. PRECAUTIONS  Ice is not a safe treatment option for people with:  Raynaud's phenomenon. This is a condition affecting small blood vessels in the extremities. Exposure to cold may cause your problems to return.   Cold hypersensitivity. There are many forms of cold hypersensitivity, including:   Cold urticaria. Red, itchy hives appear on the skin when the tissues begin to warm after being iced.   Cold erythema. This is a red, itchy rash caused by exposure to cold.   Cold hemoglobinuria. Red blood cells break down when the tissues begin to warm after being iced. The hemoglobin that carry oxygen are passed into the urine because they cannot combine with blood proteins fast enough.   Numbness or altered sensitivity in the area being iced.  If you have any of the following conditions, do not use ice until you have discussed cryotherapy with your caregiver:  Heart conditions, such as arrhythmia, angina, or chronic heart disease.   High blood pressure.   Healing wounds or open skin in the area being iced.   Current infections.   Rheumatoid arthritis.   Poor circulation.   Diabetes.  Ice slows the blood flow in the region it is applied. This is beneficial when trying to stop inflamed tissues from spreading irritating chemicals to surrounding tissues. However, if you expose your skin to cold temperatures for too long or without the proper protection, you can damage your skin or nerves. Watch for signs of skin damage due to cold. HOME CARE  INSTRUCTIONS Follow these tips to use ice and cold packs safely.  Place a dry or damp towel between the ice and skin. A damp towel will cool the skin more quickly, so you may need to shorten the time that the ice is used.   For a more rapid response, add gentle compression to the ice.   Ice for no more than 10 to 20 minutes at a time. The bonier the area you are icing, the less time it will take to get the benefits of ice.   Check your skin after 5 minutes to make sure there are no signs of a poor response to cold or skin damage.   Rest 20 minutes or more in between uses.   Once your skin is numb, you can end your treatment. You can test numbness by very lightly touching your skin. The touch should be so light that you do not see the skin dimple from the pressure of your fingertip. When using ice, most people will feel these normal sensations in this order: cold, burning, aching, and numbness.   Do not use ice on someone who cannot communicate their responses to pain, such as small children or people with dementia.  HOW TO MAKE AN ICE PACK Ice packs are the most common way to use ice therapy. Other methods include ice massage, ice baths, and cryo-sprays. Muscle creams that cause a cold, tingly feeling do not offer the same benefits that ice offers and should not be used as a substitute  unless recommended by your caregiver. To make an ice pack, do one of the following:  Place crushed ice or a bag of frozen vegetables in a sealable plastic bag. Squeeze out the excess air. Place this bag inside another plastic bag. Slide the bag into a pillowcase or place a damp towel between your skin and the bag.   Mix 3 parts water with 1 part rubbing alcohol. Freeze the mixture in a sealable plastic bag. When you remove the mixture from the freezer, it will be slushy. Squeeze out the excess air. Place this bag inside another plastic bag. Slide the bag into a pillowcase or place a damp towel between your skin  and the bag.  SEEK MEDICAL CARE IF:  You develop white spots on your skin. This may give the skin a blotchy (mottled) appearance.   Your skin turns blue or pale.   Your skin becomes waxy or hard.   Your swelling gets worse.  MAKE SURE YOU:   Understand these instructions.   Will watch your condition.   Will get help right away if you are not doing well or get worse.  Document Released: 08/13/2011 Document Revised: 12/06/2011 Document Reviewed: 08/13/2011 [facility] Patient Information 2012 [place], [place].Head Injury, Adult You have had a head injury that does not appear serious at this time. A concussion is a state of changed mental ability, usually from a blow to the head. You should take clear liquids for the rest of the day and then resume your regular diet. You should not take sedatives or alcoholic beverages for as long as directed by your caregiver after discharge. After injuries such as yours, most problems occur within the first 24 hours. SYMPTOMS These minor symptoms may be experienced after discharge:  Memory difficulties.   Dizziness.   Headaches.   Double vision.   Hearing difficulties.   Depression.   Tiredness.   Weakness.   Difficulty with concentration.  If you experience any of these problems, you should not be alarmed. A concussion requires a few days for recovery. Many patients with head injuries frequently experience such symptoms. Usually, these problems disappear without medical care. If symptoms last for more than one day, notify your caregiver. See your caregiver sooner if symptoms are becoming worse rather than better. HOME CARE INSTRUCTIONS   During the next 24 hours you must stay with someone who can watch you for the warning signs listed below.  Although it is unlikely that serious side effects will occur, you should be aware of signs and symptoms which may necessitate your return to this location. Side effects may occur up to 7 - 10 days  following the injury. It is important for you to carefully monitor your condition and contact your caregiver or seek immediate medical attention if there is a change in your condition. SEEK IMMEDIATE MEDICAL CARE IF:   There is confusion or drowsiness.   You can not awaken the injured person.   There is nausea (feeling sick to your stomach) or continued, forceful vomiting.   You notice dizziness or unsteadiness which is getting worse, or inability to walk.   You have convulsions or unconsciousness.   You experience severe, persistent headaches not relieved by over-the-counter or prescription medicines for pain. (Do not take aspirin as this impairs clotting abilities). Take other pain medications only as directed.   You can not use arms or legs normally.   There is clear or bloody discharge from the nose or ears.  MAKE SURE YOU:  Understand these instructions.   Will watch your condition.   Will get help right away if you are not doing well or get worse.  Document Released: 12/17/2005 Document Revised: 12/06/2011 Document Reviewed: 11/04/2009 [facility] Patient Information 2012 [place], [place].Contusion A contusion is a deep bruise. Contusions are the result of an injury that caused bleeding under the skin. The contusion may turn blue, purple, or yellow. Minor injuries will give you a painless contusion, but more severe contusions may stay painful and swollen for a few weeks.  CAUSES  A contusion is usually caused by a blow, trauma, or direct force to an area of the body. SYMPTOMS   Swelling and redness of the injured area.   Bruising of the injured area.   Tenderness and soreness of the injured area.   Pain.  DIAGNOSIS  The diagnosis can be made by taking a history and physical exam. An X-Geanine Vandekamp, CT scan, or MRI may be needed to determine if there were any associated injuries, such as fractures. TREATMENT  Specific treatment will depend on what area of the body was injured. In  general, the best treatment for a contusion is resting, icing, elevating, and applying cold compresses to the injured area. Over-the-counter medicines may also be recommended for pain control. Ask your caregiver what the best treatment is for your contusion. HOME CARE INSTRUCTIONS   Put ice on the injured area.   Put ice in a plastic bag.   Place a towel between your skin and the bag.   Leave the ice on for 15 to 20 minutes, 3 to 4 times a day.   Only take over-the-counter or prescription medicines for pain, discomfort, or fever as directed by your caregiver. Your caregiver may recommend avoiding anti-inflammatory medicines (aspirin, ibuprofen, and naproxen) for 48 hours because these medicines may increase bruising.   Rest the injured area.   If possible, elevate the injured area to reduce swelling.  SEEK IMMEDIATE MEDICAL CARE IF:   You have increased bruising or swelling.   You have pain that is getting worse.   Your swelling or pain is not relieved with medicines.  MAKE SURE YOU:   Understand these instructions.   Will watch your condition.   Will get help right away if you are not doing well or get worse.  Document Released: 09/26/2005 Document Revised: 12/06/2011 Document Reviewed: 10/22/2011 [facility] Patient Information 2012 [place], [place].
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
## 2012-10-22 ENCOUNTER — Encounter (HOSPITAL_COMMUNITY): Payer: Self-pay

## 2012-10-22 ENCOUNTER — Emergency Department (HOSPITAL_COMMUNITY)
Admission: EM | Admit: 2012-10-22 | Discharge: 2012-10-22 | Discharge status: Against Medical Advice | Payer: Medicaid - Out of State | Attending: Emergency Medicine | Admitting: Emergency Medicine

## 2012-10-22 DIAGNOSIS — IMO0001 Reserved for inherently not codable concepts without codable children: Secondary | ICD-10-CM | POA: Insufficient documentation

## 2012-10-22 DIAGNOSIS — R1110 Vomiting, unspecified: Secondary | ICD-10-CM | POA: Insufficient documentation

## 2012-10-22 DIAGNOSIS — H9209 Otalgia, unspecified ear: Secondary | ICD-10-CM | POA: Insufficient documentation

## 2012-10-22 DIAGNOSIS — F17200 Nicotine dependence, unspecified, uncomplicated: Secondary | ICD-10-CM | POA: Insufficient documentation

## 2012-10-22 DIAGNOSIS — Z8679 Personal history of other diseases of the circulatory system: Secondary | ICD-10-CM | POA: Insufficient documentation

## 2012-10-22 DIAGNOSIS — Z87441 Personal history of nephrotic syndrome: Secondary | ICD-10-CM | POA: Insufficient documentation

## 2012-10-22 DIAGNOSIS — Z79899 Other long term (current) drug therapy: Secondary | ICD-10-CM | POA: Insufficient documentation

## 2012-10-22 DIAGNOSIS — M791 Myalgia, unspecified site: Secondary | ICD-10-CM

## 2012-10-22 DIAGNOSIS — Z8659 Personal history of other mental and behavioral disorders: Secondary | ICD-10-CM | POA: Insufficient documentation

## 2012-10-22 LAB — CBC WITH DIFFERENTIAL/PLATELET
Hemoglobin: 12 g/dL (ref 12.0–15.0)
Lymphocytes Relative: 18 % (ref 12–46)
Lymphs Abs: 0.8 10*3/uL (ref 0.7–4.0)
Monocytes Relative: 7 % (ref 3–12)
Neutro Abs: 3.5 10*3/uL (ref 1.7–7.7)
Neutrophils Relative %: 75 % (ref 43–77)
RBC: 4.14 MIL/uL (ref 3.87–5.11)
WBC: 4.7 10*3/uL (ref 4.0–10.5)

## 2012-10-22 LAB — URINALYSIS, ROUTINE W REFLEX MICROSCOPIC
Glucose, UA: NEGATIVE mg/dL
Ketones, ur: 15 mg/dL — AB
Leukocytes, UA: NEGATIVE
Nitrite: NEGATIVE
Protein, ur: >300 mg/dL — AB

## 2012-10-22 LAB — COMPREHENSIVE METABOLIC PANEL
ALT: 15 U/L (ref 0–35)
Alkaline Phosphatase: 44 U/L (ref 39–117)
BUN: 6 mg/dL (ref 6–23)
Chloride: 98 mEq/L (ref 96–112)
GFR calc Af Amer: >90 mL/min (ref >90)
Glucose, Bld: 87 mg/dL (ref 70–99)
Potassium: 3.8 mEq/L (ref 3.5–5.1)
Sodium: 135 mEq/L (ref 135–145)
Total Bilirubin: 0.4 mg/dL (ref 0.3–1.2)

## 2012-10-22 LAB — URINE MICROSCOPIC-ADD ON

## 2012-10-22 MED ORDER — ONDANSETRON HCL 4 MG/2ML IJ SOLN
4.0000 mg | Freq: Once | INTRAMUSCULAR | Status: AC
  Administered 2012-10-22: 4 mg via INTRAVENOUS
  Filled 2012-10-22: qty 2

## 2012-10-22 MED ORDER — SODIUM CHLORIDE 0.9 % IV BOLUS (SEPSIS)
1000.0000 mL | Freq: Once | INTRAVENOUS | Status: AC
  Administered 2012-10-22: 1000 mL via INTRAVENOUS

## 2012-10-22 MED ORDER — TRAMADOL HCL 50 MG PO TABS
50.0000 mg | ORAL_TABLET | Freq: Once | ORAL | Status: DC
  Filled 2012-10-22: qty 1

## 2012-10-22 MED ORDER — PREDNISONE 20 MG PO TABS
60.0000 mg | ORAL_TABLET | Freq: Once | ORAL | Status: AC
  Administered 2012-10-22: 60 mg via ORAL
  Filled 2012-10-22: qty 1
  Filled 2012-10-22: qty 3

## 2012-10-22 NOTE — ED Notes (Signed)
Pt c/o of increased pain to upper abdomen after taking the prednisone tablets. States that pain is 10/10

## 2012-10-22 NOTE — ED Provider Notes (Signed)
History     CSN: [id number]  Arrival date & time 10/22/12  4098   First MD Initiated Contact with Patient 10/22/12 [phone]      Chief Complaint  Patient presents with  . Fever  . Generalized Body Aches  . Otalgia  . Emesis    (Consider location/radiation/quality/duration/timing/severity/associated sxs/prior treatment) HPI Pt states she has had 3 days of myalgias, fever to 101, frontal HA, nausea and vomiting. She has not been taking her meds for the past 3 days. She has only been taking Tylenol. Last dose was this AM at 0700. Staes this is similar to previous lupus flares. No congestion, cough, SOB, abd pain, diarrhea, urinary symtoms Past Medical History  Diagnosis Date  . Headache   . Anxiety   . Anemia   . Depression   . Lupus   . Lupus nephritis   . Pericardial effusion     Intermittent. Associated with lupus.    Past Surgical History  Procedure Date  . Dilation and evacuation 11/10  . Laparoscopic tubal ligation 07/10/2011    Procedure: LAPAROSCOPIC TUBAL LIGATION;  Surgeon: Purcell Nails, MD;  Location: [facility];  Service: Gynecology;  Laterality: Bilateral;  fulguration    Family History  Problem Relation Age of Onset  . Hypertension Mother     History  Substance Use Topics  . Smoking status: Current Every Day Smoker -- 7 years    Types: Cigarettes    Last Attempt to Quit: 06/20/2011  . Smokeless tobacco: Not on file  . Alcohol Use: Yes    OB History    Grav Para Term Preterm Abortions TAB SAB Ect Mult Living                  Review of Systems  Constitutional: Positive for fever and fatigue. Negative for chills.  HENT: Negative for congestion, rhinorrhea, neck pain and neck stiffness.   Respiratory: Negative for cough and shortness of breath.   Cardiovascular: Negative for chest pain.  Gastrointestinal: Negative for nausea, vomiting, abdominal pain, diarrhea and constipation.  Genitourinary: Negative for dysuria and frequency.  Musculoskeletal:  Positive for myalgias. Negative for arthralgias.  Skin: Negative for rash.  Neurological: Positive for headaches. Negative for dizziness, syncope, weakness, light-headedness and numbness.  Psychiatric/Behavioral: Negative for confusion.    Allergies  Ciprofloxacin; Metoclopramide hcl; Nsaids; and Sulfur  Home Medications   Current Outpatient Rx  Name Route Sig Dispense Refill  . AZATHIOPRINE PO Oral Take 75 mg by mouth at bedtime.    Marland Kitchen GABAPENTIN 300 MG PO CAPS Oral Take 300 mg by mouth at bedtime.    Marland Kitchen HYDROMORPHONE HCL 4 MG PO TABS Oral Take 4 mg by mouth every 4 (four) hours as needed. For pain    . HYDROXYCHLOROQUINE SULFATE 200 MG PO TABS Oral Take 200 mg by mouth 2 (two) times daily.    Marland Kitchen HYDROXYZINE HCL 25 MG PO TABS Oral Take 50 mg by mouth daily as needed. Itching    . LORAZEPAM 0.5 MG PO TABS Oral Take 0.5 mg by mouth every 8 (eight) hours as needed. For anxiety    . PREDNISONE 10 MG PO TABS Oral Take 30 mg by mouth daily.    Marland Kitchen PROMETHAZINE HCL 25 MG PO TABS Oral Take 25 mg by mouth every 6 (six) hours as needed. For nausea    . TRIAMCINOLONE ACETONIDE 0.1 % EX CREA Topical Apply 1 application topically as needed. For itching      BP 104/66  Pulse 111  Temp 99.6 F (37.6 C) (Oral)  Resp 18  Ht 5\' 5"  (1.651 m)  Wt 140 lb (63.504 kg)  BMI 23.30 kg/m2  SpO2 100%  Physical Exam  Nursing note and vitals reviewed. Constitutional: She is oriented to person, place, and time. She appears well-developed and well-nourished. No distress.  HENT:  Head: Normocephalic and atraumatic.  Mouth/Throat: Oropharynx is clear and moist. No oropharyngeal exudate.  Eyes: EOM are normal. Pupils are equal, round, and reactive to light.  Neck: Normal range of motion. Neck supple.       No meningismus   Cardiovascular: Normal rate and regular rhythm.   Pulmonary/Chest: Effort normal and breath sounds normal. No respiratory distress. She has no wheezes. She has no rales.  Abdominal: Soft.  Bowel sounds are normal. She exhibits no mass. There is no tenderness. There is no rebound and no guarding.  Musculoskeletal: Normal range of motion. She exhibits no edema and no tenderness.  Neurological: She is alert and oriented to person, place, and time.       Moves all ext without deficit. Sensation grossly intact  Skin: Skin is warm and dry. No rash noted. No erythema.  Psychiatric: She has a normal mood and affect. Her behavior is normal.    ED Course  Procedures (including critical care time)  Labs Reviewed  URINALYSIS, ROUTINE W REFLEX MICROSCOPIC - Abnormal; Notable for the following:    Color, Urine AMBER (*)  BIOCHEMICALS MAY BE AFFECTED BY COLOR   APPearance CLOUDY (*)     Bilirubin Urine SMALL (*)     Ketones, ur 15 (*)     Protein, ur >300 (*)     All other components within normal limits  URINE MICROSCOPIC-ADD ON - Abnormal; Notable for the following:    Squamous Epithelial / LPF MANY (*)     Bacteria, UA MANY (*)     All other components within normal limits  CBC WITH DIFFERENTIAL  COMPREHENSIVE METABOLIC PANEL  PREGNANCY, URINE   No results found.   1. Myalgia       MDM   Pt left AMA.        Loren Racer, MD 10/22/12 [phone]

## 2012-10-22 NOTE — ED Notes (Signed)
Pt presents with NAD- c/o of fever controlled with tylenol- last tylenol at 7am. C/o of generalized body aches and ear pain- last vomited this 4am.

## 2012-10-22 NOTE — ED Notes (Signed)
Pt concerned that tramadol is a NSAID, verified with pharm tech, charge nurse, and pharmacist tramadol is not an NSAID.

## 2012-10-22 NOTE — ED Notes (Signed)
Pt refusing to take tramadol, stating that "its a NSAID"

## 2012-10-22 NOTE — ED Notes (Signed)
Pt stating that she needs to go. IV removed.

## 2012-10-22 NOTE — ED Notes (Signed)
Pt informed that dr was reviewing results and would be in to speak with her shortly. MD notified

## 2013-01-31 ENCOUNTER — Emergency Department (HOSPITAL_COMMUNITY)
Admission: EM | Admit: 2013-01-31 | Discharge: 2013-01-31 | Disposition: A | Discharge status: Home / Self Care | Payer: Medicaid - Out of State | Attending: Emergency Medicine | Admitting: Emergency Medicine

## 2013-01-31 ENCOUNTER — Encounter (HOSPITAL_COMMUNITY): Payer: Self-pay | Admitting: Emergency Medicine

## 2013-01-31 DIAGNOSIS — Z862 Personal history of diseases of the blood and blood-forming organs and certain disorders involving the immune mechanism: Secondary | ICD-10-CM | POA: Insufficient documentation

## 2013-01-31 DIAGNOSIS — Z8659 Personal history of other mental and behavioral disorders: Secondary | ICD-10-CM | POA: Insufficient documentation

## 2013-01-31 DIAGNOSIS — R52 Pain, unspecified: Secondary | ICD-10-CM | POA: Insufficient documentation

## 2013-01-31 DIAGNOSIS — R110 Nausea: Secondary | ICD-10-CM | POA: Insufficient documentation

## 2013-01-31 DIAGNOSIS — F17200 Nicotine dependence, unspecified, uncomplicated: Secondary | ICD-10-CM | POA: Insufficient documentation

## 2013-01-31 DIAGNOSIS — H20049 Secondary noninfectious iridocyclitis, unspecified eye: Secondary | ICD-10-CM | POA: Insufficient documentation

## 2013-01-31 DIAGNOSIS — N058 Unspecified nephritic syndrome with other morphologic changes: Secondary | ICD-10-CM | POA: Insufficient documentation

## 2013-01-31 DIAGNOSIS — M329 Systemic lupus erythematosus, unspecified: Secondary | ICD-10-CM | POA: Insufficient documentation

## 2013-01-31 DIAGNOSIS — Z8679 Personal history of other diseases of the circulatory system: Secondary | ICD-10-CM | POA: Insufficient documentation

## 2013-01-31 DIAGNOSIS — Z79899 Other long term (current) drug therapy: Secondary | ICD-10-CM | POA: Insufficient documentation

## 2013-01-31 MED ORDER — OXYCODONE-ACETAMINOPHEN 5-325 MG PO TABS: 2.0000 | ORAL_TABLET | ORAL | Status: DC | PRN

## 2013-01-31 MED ORDER — ONDANSETRON HCL 4 MG/2ML IJ SOLN
4.0000 mg | Freq: Once | INTRAMUSCULAR | Status: AC
  Administered 2013-01-31: 4 mg via INTRAVENOUS
  Filled 2013-01-31: qty 2

## 2013-01-31 MED ORDER — HYDROMORPHONE HCL PF 1 MG/ML IJ SOLN
1.0000 mg | Freq: Once | INTRAMUSCULAR | Status: AC
  Administered 2013-01-31: 1 mg via INTRAVENOUS
  Filled 2013-01-31: qty 1

## 2013-01-31 MED ORDER — SODIUM CHLORIDE 0.9 % IV BOLUS (SEPSIS)
1000.0000 mL | Freq: Once | INTRAVENOUS | Status: AC
  Administered 2013-01-31: 1000 mL via INTRAVENOUS

## 2013-01-31 MED ORDER — METHYLPREDNISOLONE SODIUM SUCC 125 MG IJ SOLR
125.0000 mg | Freq: Once | INTRAMUSCULAR | Status: AC
  Administered 2013-01-31: 125 mg via INTRAVENOUS
  Filled 2013-01-31: qty 2

## 2013-01-31 MED ORDER — PREDNISONE 10 MG PO TABS: 30.0000 mg | ORAL_TABLET | Freq: Every day | ORAL | Status: DC

## 2013-01-31 MED ORDER — SODIUM CHLORIDE 0.9 % IV SOLN: INTRAVENOUS | Status: DC

## 2013-01-31 MED ORDER — ONDANSETRON 4 MG PO TBDP: 4.0000 mg | ORAL_TABLET | Freq: Three times a day (TID) | ORAL | Status: DC | PRN

## 2013-01-31 NOTE — ED Notes (Signed)
Pt states that she has lupus and has not taken her daily meds in a few days.  States that there is an issue with her doctor/pharmacy because there is no refills on her meds.  C/o headache, gen body aches, and nausea x 2 days.

## 2013-01-31 NOTE — ED Provider Notes (Signed)
History     CSN: [id number]  Arrival date & time 01/31/13  1312   First MD Initiated Contact with Patient 01/31/13 1346      Chief Complaint  Patient presents with  . Lupus  . Generalized Body Aches  . Nausea    (Consider location/radiation/quality/duration/timing/severity/associated sxs/prior treatment) The history is provided by the patient.   patient here due 2 running out of her prednisone and causing a flare of her lupus. Denies any fever or chills. No vomiting or diarrhea but does have some nausea. Denies any chest pain or abdominal pain. No rashes or fever noted. Patient does have a headache but this is similar to her prior headaches associated with steroid withdrawal. Notes that last time she has steroids was 2 days ago.  Past Medical History  Diagnosis Date  . Headache   . Anxiety   . Anemia   . Depression   . Lupus   . Lupus nephritis   . Pericardial effusion     Intermittent. Associated with lupus.    Past Surgical History  Procedure Date  . Dilation and evacuation 11/10  . Laparoscopic tubal ligation 07/10/2011    Procedure: LAPAROSCOPIC TUBAL LIGATION;  Surgeon: Purcell Nails, MD;  Location: [facility];  Service: Gynecology;  Laterality: Bilateral;  fulguration    Family History  Problem Relation Age of Onset  . Hypertension Mother     History  Substance Use Topics  . Smoking status: Current Every Day Smoker -- 7 years    Types: Cigarettes    Last Attempt to Quit: 06/20/2011  . Smokeless tobacco: Not on file  . Alcohol Use: Yes    OB History    Grav Para Term Preterm Abortions TAB SAB Ect Mult Living                  Review of Systems  All other systems reviewed and are negative.    Allergies  Ciprofloxacin; Metoclopramide hcl; Nsaids; and Sulfur  Home Medications   Current Outpatient Rx  Name  Route  Sig  Dispense  Refill  . AZATHIOPRINE PO   Oral   Take 75 mg by mouth at bedtime.         Marland Kitchen GABAPENTIN 300 MG PO CAPS   Oral  Take 300 mg by mouth at bedtime.         Marland Kitchen HYDROXYCHLOROQUINE SULFATE 200 MG PO TABS   Oral   Take 200 mg by mouth 2 (two) times daily.         Marland Kitchen PREDNISONE 10 MG PO TABS   Oral   Take 30 mg by mouth daily.         Marland Kitchen PROMETHAZINE HCL 25 MG PO TABS   Oral   Take 25 mg by mouth every 6 (six) hours as needed. For nausea         . TRIAMCINOLONE ACETONIDE 0.1 % EX CREA   Topical   Apply 1 application topically as needed. For itching           BP 108/74  Pulse 97  Temp 97.9 F (36.6 C) (Oral)  Resp 16  SpO2 99%  Physical Exam  Nursing note and vitals reviewed. Constitutional: She is oriented to person, place, and time. She appears well-developed and well-nourished.  Non-toxic appearance. No distress.  HENT:  Head: Normocephalic and atraumatic.  Eyes: Conjunctivae normal, EOM and lids are normal. Pupils are equal, round, and reactive to light.  Neck: Normal range of motion.  Neck supple. No tracheal deviation present. No mass present.  Cardiovascular: Normal rate, regular rhythm and normal heart sounds.  Exam reveals no gallop.   No murmur heard. Pulmonary/Chest: Effort normal and breath sounds normal. No stridor. No respiratory distress. She has no decreased breath sounds. She has no wheezes. She has no rhonchi. She has no rales.  Abdominal: Soft. Normal appearance and bowel sounds are normal. She exhibits no distension. There is no tenderness. There is no rebound and no CVA tenderness.  Musculoskeletal: Normal range of motion. She exhibits no edema and no tenderness.  Neurological: She is alert and oriented to person, place, and time. She has normal strength. No cranial nerve deficit or sensory deficit. GCS eye subscore is 4. GCS verbal subscore is 5. GCS motor subscore is 6.  Skin: Skin is warm and dry. No abrasion and no rash noted.  Psychiatric: She has a normal mood and affect. Her speech is normal and behavior is normal.    ED Course  Procedures (including  critical care time)  Labs Reviewed - No data to display No results found.   No diagnosis found.    MDM  Patient given IV fluids, steroids, pain medication and anti-medics and she feels better. Will refill her prescription for prednisone and give her a prescription for opiate medications        Toy Baker, MD 01/31/13 1544

## 2013-06-19 ENCOUNTER — Emergency Department (HOSPITAL_COMMUNITY)
Admission: EM | Admit: 2013-06-19 | Discharge: 2013-06-19 | Disposition: A | Discharge status: Home / Self Care | Payer: Medicaid - Out of State | Attending: Emergency Medicine | Admitting: Emergency Medicine

## 2013-06-19 ENCOUNTER — Encounter (HOSPITAL_COMMUNITY): Payer: Self-pay | Admitting: *Deleted

## 2013-06-19 ENCOUNTER — Emergency Department (HOSPITAL_COMMUNITY): Payer: Medicaid - Out of State

## 2013-06-19 DIAGNOSIS — D649 Anemia, unspecified: Secondary | ICD-10-CM | POA: Insufficient documentation

## 2013-06-19 DIAGNOSIS — F1010 Alcohol abuse, uncomplicated: Secondary | ICD-10-CM | POA: Insufficient documentation

## 2013-06-19 DIAGNOSIS — IMO0002 Reserved for concepts with insufficient information to code with codable children: Secondary | ICD-10-CM | POA: Insufficient documentation

## 2013-06-19 DIAGNOSIS — S0512XA Contusion of eyeball and orbital tissues, left eye, initial encounter: Secondary | ICD-10-CM

## 2013-06-19 DIAGNOSIS — Z8739 Personal history of other diseases of the musculoskeletal system and connective tissue: Secondary | ICD-10-CM | POA: Insufficient documentation

## 2013-06-19 DIAGNOSIS — Z79899 Other long term (current) drug therapy: Secondary | ICD-10-CM | POA: Insufficient documentation

## 2013-06-19 DIAGNOSIS — F329 Major depressive disorder, single episode, unspecified: Secondary | ICD-10-CM | POA: Insufficient documentation

## 2013-06-19 DIAGNOSIS — F411 Generalized anxiety disorder: Secondary | ICD-10-CM | POA: Insufficient documentation

## 2013-06-19 DIAGNOSIS — F3289 Other specified depressive episodes: Secondary | ICD-10-CM | POA: Insufficient documentation

## 2013-06-19 DIAGNOSIS — F17200 Nicotine dependence, unspecified, uncomplicated: Secondary | ICD-10-CM | POA: Insufficient documentation

## 2013-06-19 DIAGNOSIS — Y9289 Other specified places as the place of occurrence of the external cause: Secondary | ICD-10-CM | POA: Insufficient documentation

## 2013-06-19 DIAGNOSIS — Y9383 Activity, rough housing and horseplay: Secondary | ICD-10-CM | POA: Insufficient documentation

## 2013-06-19 DIAGNOSIS — H538 Other visual disturbances: Secondary | ICD-10-CM | POA: Insufficient documentation

## 2013-06-19 DIAGNOSIS — Z8709 Personal history of other diseases of the respiratory system: Secondary | ICD-10-CM | POA: Insufficient documentation

## 2013-06-19 DIAGNOSIS — Z87448 Personal history of other diseases of urinary system: Secondary | ICD-10-CM | POA: Insufficient documentation

## 2013-06-19 DIAGNOSIS — S0510XA Contusion of eyeball and orbital tissues, unspecified eye, initial encounter: Secondary | ICD-10-CM | POA: Insufficient documentation

## 2013-06-19 MED ORDER — FLUORESCEIN SODIUM 1 MG OP STRP
1.0000 | ORAL_STRIP | Freq: Once | OPHTHALMIC | Status: AC
  Administered 2013-06-19: 1 via OPHTHALMIC
  Filled 2013-06-19: qty 1

## 2013-06-19 MED ORDER — TETRACAINE HCL 0.5 % OP SOLN
2.0000 [drp] | Freq: Once | OPHTHALMIC | Status: AC
  Administered 2013-06-19: 2 [drp] via OPHTHALMIC
  Filled 2013-06-19: qty 2

## 2013-06-19 MED ORDER — ACETAMINOPHEN 325 MG PO TABS
650.0000 mg | ORAL_TABLET | Freq: Once | ORAL | Status: AC
  Administered 2013-06-19: 650 mg via ORAL
  Filled 2013-06-19: qty 2

## 2013-06-19 NOTE — ED Provider Notes (Signed)
Medical screening examination/treatment/procedure(s) were performed by non-physician practitioner and as supervising physician I was immediately available for consultation/collaboration.  Zarin Hagmann M Delwin Raczkowski, MD 06/19/13 0621 
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
## 2013-08-12 ENCOUNTER — Encounter (HOSPITAL_COMMUNITY): Payer: Self-pay | Admitting: Emergency Medicine

## 2013-08-12 ENCOUNTER — Emergency Department (HOSPITAL_COMMUNITY)
Admission: EM | Admit: 2013-08-12 | Discharge: 2013-08-12 | Disposition: A | Discharge status: Home / Self Care | Payer: Medicaid - Out of State | Attending: Emergency Medicine | Admitting: Emergency Medicine

## 2013-08-12 DIAGNOSIS — R51 Headache: Secondary | ICD-10-CM | POA: Insufficient documentation

## 2013-08-12 DIAGNOSIS — Z79899 Other long term (current) drug therapy: Secondary | ICD-10-CM | POA: Insufficient documentation

## 2013-08-12 DIAGNOSIS — F17200 Nicotine dependence, unspecified, uncomplicated: Secondary | ICD-10-CM | POA: Insufficient documentation

## 2013-08-12 DIAGNOSIS — Z862 Personal history of diseases of the blood and blood-forming organs and certain disorders involving the immune mechanism: Secondary | ICD-10-CM | POA: Insufficient documentation

## 2013-08-12 DIAGNOSIS — F411 Generalized anxiety disorder: Secondary | ICD-10-CM | POA: Insufficient documentation

## 2013-08-12 DIAGNOSIS — F3289 Other specified depressive episodes: Secondary | ICD-10-CM | POA: Insufficient documentation

## 2013-08-12 DIAGNOSIS — F329 Major depressive disorder, single episode, unspecified: Secondary | ICD-10-CM | POA: Insufficient documentation

## 2013-08-12 DIAGNOSIS — IMO0002 Reserved for concepts with insufficient information to code with codable children: Secondary | ICD-10-CM | POA: Insufficient documentation

## 2013-08-12 DIAGNOSIS — Z8679 Personal history of other diseases of the circulatory system: Secondary | ICD-10-CM | POA: Insufficient documentation

## 2013-08-12 LAB — BASIC METABOLIC PANEL
BUN: 7 mg/dL (ref 6–23)
CO2: 26 mEq/L (ref 19–32)
Chloride: 101 mEq/L (ref 96–112)
Creatinine, Ser: 0.65 mg/dL (ref 0.50–1.10)
Potassium: 3.5 mEq/L (ref 3.5–5.1)

## 2013-08-12 LAB — CBC WITH DIFFERENTIAL/PLATELET
HCT: 38.2 % (ref 36.0–46.0)
Hemoglobin: 11.9 g/dL — ABNORMAL LOW (ref 12.0–15.0)
Lymphocytes Relative: 12 % (ref 12–46)
Lymphs Abs: 0.6 10*3/uL — ABNORMAL LOW (ref 0.7–4.0)
Monocytes Absolute: 0.4 10*3/uL (ref 0.1–1.0)
Monocytes Relative: 10 % (ref 3–12)
Neutro Abs: 3.5 10*3/uL (ref 1.7–7.7)
RBC: 3.9 MIL/uL (ref 3.87–5.11)
WBC: 4.5 10*3/uL (ref 4.0–10.5)

## 2013-08-12 MED ORDER — ONDANSETRON HCL 4 MG/2ML IJ SOLN
4.0000 mg | Freq: Once | INTRAMUSCULAR | Status: AC
  Administered 2013-08-12: 4 mg via INTRAVENOUS
  Filled 2013-08-12: qty 2

## 2013-08-12 MED ORDER — HYDROCODONE-ACETAMINOPHEN 5-325 MG PO TABS: 1.0000 | ORAL_TABLET | Freq: Four times a day (QID) | ORAL | Status: DC | PRN

## 2013-08-12 MED ORDER — HYDROMORPHONE HCL PF 1 MG/ML IJ SOLN
1.0000 mg | Freq: Once | INTRAMUSCULAR | Status: AC
  Administered 2013-08-12: 1 mg via INTRAVENOUS
  Filled 2013-08-12: qty 1

## 2013-08-12 MED ORDER — VENLAFAXINE HCL ER 37.5 MG PO CP24: 37.5000 mg | ORAL_CAPSULE | Freq: Every day | ORAL | Status: DC

## 2013-08-12 MED ORDER — SODIUM CHLORIDE 0.9 % IV BOLUS (SEPSIS)
1000.0000 mL | Freq: Once | INTRAVENOUS | Status: AC
  Administered 2013-08-12: 1000 mL via INTRAVENOUS

## 2013-08-12 MED ORDER — PREDNISONE 10 MG PO TABS: ORAL_TABLET | ORAL | Status: DC

## 2013-08-12 MED ORDER — METHYLPREDNISOLONE SODIUM SUCC 125 MG IJ SOLR
125.0000 mg | Freq: Once | INTRAMUSCULAR | Status: AC
  Administered 2013-08-12: 125 mg via INTRAVENOUS
  Filled 2013-08-12: qty 2

## 2013-08-12 NOTE — ED Notes (Signed)
Pt states she has been out of meds (prednisone, antianxiety) since Sunday, pt states she has not eaten since that time. Pt states she needs to see MD before getting refills. Pt c/o general body pain, HA, nausea, pt states she had syncope x 2 today without injury.

## 2013-08-12 NOTE — ED Provider Notes (Signed)
CSN: [id number]     Arrival date & time 08/12/13  1934 History     First MD Initiated Contact with Patient 08/12/13 2042     Chief Complaint  Patient presents with  . Lupus    (Consider location/radiation/quality/duration/timing/severity/associated sxs/prior Treatment) Patient is a 32 y.o. female presenting with headaches. The history is provided by the patient (the pt complains of a headache and has not had her prednisone in 5 days).  Headache Pain location:  Generalized Quality:  Dull Radiates to:  Does not radiate Severity currently:  8/10 Severity at highest:  9/10 Onset quality:  Sudden Timing:  Constant Progression:  Waxing and waning Chronicity:  Recurrent Similar to prior headaches: no   Context: activity   Associated symptoms: no abdominal pain, no back pain, no congestion, no cough, no diarrhea, no fatigue, no seizures and no sinus pressure     Past Medical History  Diagnosis Date  . Headache(784.0)   . Anxiety   . Anemia   . Depression   . Lupus   . Lupus nephritis   . Pericardial effusion     Intermittent. Associated with lupus.   Past Surgical History  Procedure Laterality Date  . Dilation and evacuation  11/10  . Laparoscopic tubal ligation  07/10/2011    Procedure: LAPAROSCOPIC TUBAL LIGATION;  Surgeon: Purcell Nails, MD;  Location: [facility];  Service: Gynecology;  Laterality: Bilateral;  fulguration   Family History  Problem Relation Age of Onset  . Hypertension Mother    History  Substance Use Topics  . Smoking status: Current Every Day Smoker -- 7 years    Types: Cigarettes    Last Attempt to Quit: 06/20/2011  . Smokeless tobacco: Not on file  . Alcohol Use: Yes     Comment: occasional    OB History   Grav Para Term Preterm Abortions TAB SAB Ect Mult Living                 Review of Systems  Constitutional: Negative for appetite change and fatigue.  HENT: Negative for congestion, sinus pressure and ear discharge.   Eyes: Negative for  discharge.  Respiratory: Negative for cough.   Cardiovascular: Negative for chest pain.  Gastrointestinal: Negative for abdominal pain and diarrhea.  Genitourinary: Negative for frequency and hematuria.  Musculoskeletal: Negative for back pain.  Skin: Negative for rash.  Neurological: Positive for headaches. Negative for seizures.  Psychiatric/Behavioral: Negative for hallucinations.    Allergies  Ciprofloxacin; Metoclopramide hcl; Nsaids; Pork-derived products; Sulfur; and Tramadol  Home Medications   Current Outpatient Rx  Name  Route  Sig  Dispense  Refill  . azaTHIOprine (IMURAN) 50 MG tablet   Oral   Take 150 mg by mouth daily.         Marland Kitchen gabapentin (NEURONTIN) 800 MG tablet   Oral   Take 800 mg by mouth 3 (three) times daily.         . hydroxychloroquine (PLAQUENIL) 200 MG tablet   Oral   Take 200 mg by mouth 2 (two) times daily.         . ondansetron (ZOFRAN-ODT) 4 MG disintegrating tablet   Oral   Take 4 mg by mouth every 8 (eight) hours as needed for nausea.         . predniSONE (DELTASONE) 10 MG tablet   Oral   Take 20 mg by mouth daily.          . promethazine (PHENERGAN) 25 MG tablet  Oral   Take 25 mg by mouth every 6 (six) hours as needed for nausea.          Marland Kitchen venlafaxine XR (EFFEXOR-XR) 37.5 MG 24 hr capsule   Oral   Take 37.5 mg by mouth daily.         Marland Kitchen HYDROcodone-acetaminophen (NORCO/VICODIN) 5-325 MG per tablet   Oral   Take 1 tablet by mouth every 6 (six) hours as needed for pain.   12 tablet   0   . predniSONE (DELTASONE) 10 MG tablet      Take one pill a day   14 tablet   0   . venlafaxine XR (EFFEXOR XR) 37.5 MG 24 hr capsule   Oral   Take 1 capsule (37.5 mg total) by mouth daily.   14 capsule   0    BP 115/80  Pulse 91  Temp(Src) 98.7 F (37.1 C) (Oral)  Resp 16  Ht 5\' 5"  (1.651 m)  Wt 130 lb (58.968 kg)  BMI 21.63 kg/m2  SpO2 100%  LMP 06/01/2013 Physical Exam  Constitutional: She is oriented to  person, place, and time. She appears well-developed.  HENT:  Head: Normocephalic.  Eyes: Conjunctivae and EOM are normal. No scleral icterus.  Neck: Neck supple. No thyromegaly present.  Cardiovascular: Normal rate and regular rhythm.  Exam reveals no gallop and no friction rub.   No murmur heard. Pulmonary/Chest: No stridor. She has no wheezes. She has no rales. She exhibits no tenderness.  Abdominal: She exhibits no distension. There is no tenderness. There is no rebound.  Musculoskeletal: Normal range of motion. She exhibits no edema.  Lymphadenopathy:    She has no cervical adenopathy.  Neurological: She is oriented to person, place, and time. Coordination normal.  Skin: No rash noted. No erythema.  Psychiatric: She has a normal mood and affect. Her behavior is normal.    ED Course   Procedures (including critical care time)  Labs Reviewed  CBC WITH DIFFERENTIAL - Abnormal; Notable for the following:    Hemoglobin 11.9 (*)    Lymphs Abs 0.6 (*)    All other components within normal limits  BASIC METABOLIC PANEL   No results found. 1. Headache     MDM    Benny Lennert, MD 08/12/13 [phone]

## 2013-09-16 ENCOUNTER — Emergency Department (HOSPITAL_COMMUNITY)
Admission: EM | Admit: 2013-09-16 | Discharge: 2013-09-16 | Disposition: A | Discharge status: Other Acute Inpatient Hospital | Payer: Medicaid - Out of State | Attending: Emergency Medicine | Admitting: Emergency Medicine

## 2013-09-16 ENCOUNTER — Inpatient Hospital Stay (HOSPITAL_COMMUNITY)
Admission: AD | Admit: 2013-09-16 | Discharge: 2013-09-17 | DRG: 885 | Disposition: A | Discharge status: Home / Self Care | Payer: Medicaid - Out of State | Source: Intra-hospital | Attending: Psychiatry | Admitting: Psychiatry

## 2013-09-16 ENCOUNTER — Emergency Department (HOSPITAL_COMMUNITY): Payer: Medicaid - Out of State

## 2013-09-16 ENCOUNTER — Encounter (HOSPITAL_COMMUNITY): Payer: Self-pay | Admitting: Emergency Medicine

## 2013-09-16 ENCOUNTER — Encounter (HOSPITAL_COMMUNITY): Payer: Self-pay

## 2013-09-16 DIAGNOSIS — N390 Urinary tract infection, site not specified: Secondary | ICD-10-CM | POA: Insufficient documentation

## 2013-09-16 DIAGNOSIS — Z79899 Other long term (current) drug therapy: Secondary | ICD-10-CM

## 2013-09-16 DIAGNOSIS — F32A Depression, unspecified: Secondary | ICD-10-CM | POA: Diagnosis present

## 2013-09-16 DIAGNOSIS — F4310 Post-traumatic stress disorder, unspecified: Secondary | ICD-10-CM

## 2013-09-16 DIAGNOSIS — Z3202 Encounter for pregnancy test, result negative: Secondary | ICD-10-CM | POA: Insufficient documentation

## 2013-09-16 DIAGNOSIS — F332 Major depressive disorder, recurrent severe without psychotic features: Principal | ICD-10-CM | POA: Diagnosis present

## 2013-09-16 DIAGNOSIS — T7411XA Adult physical abuse, confirmed, initial encounter: Secondary | ICD-10-CM

## 2013-09-16 DIAGNOSIS — F17200 Nicotine dependence, unspecified, uncomplicated: Secondary | ICD-10-CM | POA: Insufficient documentation

## 2013-09-16 DIAGNOSIS — F3289 Other specified depressive episodes: Secondary | ICD-10-CM

## 2013-09-16 DIAGNOSIS — F329 Major depressive disorder, single episode, unspecified: Secondary | ICD-10-CM

## 2013-09-16 DIAGNOSIS — IMO0002 Reserved for concepts with insufficient information to code with codable children: Secondary | ICD-10-CM | POA: Diagnosis present

## 2013-09-16 DIAGNOSIS — Z8739 Personal history of other diseases of the musculoskeletal system and connective tissue: Secondary | ICD-10-CM | POA: Insufficient documentation

## 2013-09-16 DIAGNOSIS — F4321 Adjustment disorder with depressed mood: Secondary | ICD-10-CM | POA: Diagnosis present

## 2013-09-16 DIAGNOSIS — Z862 Personal history of diseases of the blood and blood-forming organs and certain disorders involving the immune mechanism: Secondary | ICD-10-CM | POA: Insufficient documentation

## 2013-09-16 DIAGNOSIS — R45851 Suicidal ideations: Secondary | ICD-10-CM

## 2013-09-16 DIAGNOSIS — S0993XA Unspecified injury of face, initial encounter: Secondary | ICD-10-CM | POA: Insufficient documentation

## 2013-09-16 DIAGNOSIS — S0990XA Unspecified injury of head, initial encounter: Secondary | ICD-10-CM | POA: Insufficient documentation

## 2013-09-16 DIAGNOSIS — T7491XA Unspecified adult maltreatment, confirmed, initial encounter: Secondary | ICD-10-CM

## 2013-09-16 DIAGNOSIS — F411 Generalized anxiety disorder: Secondary | ICD-10-CM | POA: Insufficient documentation

## 2013-09-16 DIAGNOSIS — Z8679 Personal history of other diseases of the circulatory system: Secondary | ICD-10-CM | POA: Insufficient documentation

## 2013-09-16 LAB — POCT I-STAT, CHEM 8
Glucose, Bld: 94 mg/dL (ref 70–99)
HCT: 36 % (ref 36.0–46.0)
Hemoglobin: 12.2 g/dL (ref 12.0–15.0)
Potassium: 3.8 mEq/L (ref 3.5–5.1)
Sodium: 140 mEq/L (ref 135–145)
TCO2: 23 mmol/L (ref 0–100)

## 2013-09-16 LAB — RAPID URINE DRUG SCREEN, HOSP PERFORMED
Amphetamines: NOT DETECTED
Benzodiazepines: NOT DETECTED
Tetrahydrocannabinol: POSITIVE — AB

## 2013-09-16 LAB — CBC WITH DIFFERENTIAL/PLATELET
Basophils Relative: 0 % (ref 0–1)
Eosinophils Absolute: 0 10*3/uL (ref 0.0–0.7)
MCH: 31.2 pg (ref 26.0–34.0)
MCHC: 32.8 g/dL (ref 30.0–36.0)
Neutrophils Relative %: 85 % — ABNORMAL HIGH (ref 43–77)
Platelets: 281 10*3/uL (ref 150–400)

## 2013-09-16 LAB — URINALYSIS, ROUTINE W REFLEX MICROSCOPIC
Hgb urine dipstick: NEGATIVE
Ketones, ur: 40 mg/dL — AB
Protein, ur: 100 mg/dL — AB
Urobilinogen, UA: 1 mg/dL (ref 0.0–1.0)

## 2013-09-16 LAB — POCT PREGNANCY, URINE: Preg Test, Ur: NEGATIVE

## 2013-09-16 LAB — ETHANOL: Alcohol, Ethyl (B): <11 mg/dL (ref 0–11)

## 2013-09-16 LAB — URINE MICROSCOPIC-ADD ON

## 2013-09-16 MED ORDER — VENLAFAXINE HCL ER 37.5 MG PO CP24
37.5000 mg | ORAL_CAPSULE | Freq: Every day | ORAL | Status: DC
  Administered 2013-09-16: 37.5 mg via ORAL
  Filled 2013-09-16: qty 1

## 2013-09-16 MED ORDER — TRAZODONE HCL 50 MG PO TABS
50.0000 mg | ORAL_TABLET | Freq: Every evening | ORAL | Status: DC | PRN
  Administered 2013-09-16: 50 mg via ORAL
  Filled 2013-09-16: qty 1

## 2013-09-16 MED ORDER — ACETAMINOPHEN 500 MG PO TABS
1000.0000 mg | ORAL_TABLET | Freq: Once | ORAL | Status: AC
  Administered 2013-09-16: 1000 mg via ORAL
  Filled 2013-09-16: qty 2

## 2013-09-16 MED ORDER — HYDROCODONE-ACETAMINOPHEN 5-325 MG PO TABS
1.0000 | ORAL_TABLET | Freq: Once | ORAL | Status: AC
  Administered 2013-09-16: 1 via ORAL
  Filled 2013-09-16: qty 1

## 2013-09-16 MED ORDER — MAGNESIUM HYDROXIDE 400 MG/5ML PO SUSP: 30.0000 mL | Freq: Every day | ORAL | Status: DC | PRN

## 2013-09-16 MED ORDER — ONDANSETRON HCL 4 MG PO TABS
4.0000 mg | ORAL_TABLET | Freq: Three times a day (TID) | ORAL | Status: DC | PRN
  Administered 2013-09-16: 4 mg via ORAL
  Filled 2013-09-16: qty 1

## 2013-09-16 MED ORDER — ACETAMINOPHEN 325 MG PO TABS
650.0000 mg | ORAL_TABLET | Freq: Four times a day (QID) | ORAL | Status: DC | PRN
  Administered 2013-09-16 (×2): 650 mg via ORAL
  Filled 2013-09-16 (×2): qty 2

## 2013-09-16 MED ORDER — NICOTINE 21 MG/24HR TD PT24
21.0000 mg | MEDICATED_PATCH | Freq: Every day | TRANSDERMAL | Status: DC
  Administered 2013-09-16: 21 mg via TRANSDERMAL
  Filled 2013-09-16: qty 1

## 2013-09-16 MED ORDER — ACETAMINOPHEN 325 MG PO TABS
650.0000 mg | ORAL_TABLET | Freq: Four times a day (QID) | ORAL | Status: DC | PRN
  Administered 2013-09-16 – 2013-09-17 (×2): 650 mg via ORAL

## 2013-09-16 MED ORDER — LORAZEPAM 1 MG PO TABS: 1.0000 mg | ORAL_TABLET | Freq: Three times a day (TID) | ORAL | Status: DC | PRN

## 2013-09-16 MED ORDER — ALUM & MAG HYDROXIDE-SIMETH 200-200-20 MG/5ML PO SUSP: 30.0000 mL | ORAL | Status: DC | PRN

## 2013-09-16 MED ORDER — AMOXICILLIN 250 MG PO CAPS
250.0000 mg | ORAL_CAPSULE | Freq: Three times a day (TID) | ORAL | Status: DC
  Administered 2013-09-16 (×2): 250 mg via ORAL
  Filled 2013-09-16 (×4): qty 1

## 2013-09-16 MED ORDER — HYDROXYCHLOROQUINE SULFATE 200 MG PO TABS
200.0000 mg | ORAL_TABLET | Freq: Two times a day (BID) | ORAL | Status: DC
  Administered 2013-09-16: 200 mg via ORAL
  Filled 2013-09-16 (×2): qty 1

## 2013-09-16 MED ORDER — ONDANSETRON 4 MG PO TBDP
4.0000 mg | ORAL_TABLET | Freq: Once | ORAL | Status: AC
  Administered 2013-09-16: 4 mg via ORAL
  Filled 2013-09-16: qty 1

## 2013-09-16 MED ORDER — PREDNISONE 20 MG PO TABS
20.0000 mg | ORAL_TABLET | Freq: Every day | ORAL | Status: DC
  Administered 2013-09-16: 20 mg via ORAL
  Filled 2013-09-16: qty 1

## 2013-09-16 MED ORDER — AZATHIOPRINE 50 MG PO TABS
150.0000 mg | ORAL_TABLET | Freq: Every day | ORAL | Status: DC
  Administered 2013-09-16: 150 mg via ORAL
  Filled 2013-09-16: qty 3

## 2013-09-16 NOTE — ED Notes (Signed)
Patient discharge with no acute distress noted
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
## 2013-09-16 NOTE — ED Notes (Addendum)
Transfer held until further notice per request of Tanna Savoy at [facility].
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
## 2013-09-16 NOTE — ED Provider Notes (Signed)
Medical screening examination/treatment/procedure(s) were performed by non-physician practitioner and as supervising physician I was immediately available for consultation/collaboration.   William Saloma Cadena, MD 09/16/13 0727 
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
## 2013-09-16 NOTE — ED Notes (Signed)
Pt's mother will be taking belongings home with them.
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
## 2013-09-17 DIAGNOSIS — F329 Major depressive disorder, single episode, unspecified: Secondary | ICD-10-CM

## 2013-09-17 DIAGNOSIS — F3289 Other specified depressive episodes: Secondary | ICD-10-CM

## 2013-09-17 DIAGNOSIS — T7411XA Adult physical abuse, confirmed, initial encounter: Secondary | ICD-10-CM

## 2013-09-17 MED ORDER — VENLAFAXINE HCL ER 37.5 MG PO CP24
37.5000 mg | ORAL_CAPSULE | Freq: Every day | ORAL | Status: DC
  Administered 2013-09-17: 37.5 mg via ORAL
  Filled 2013-09-17 (×2): qty 1

## 2013-09-17 MED ORDER — VENLAFAXINE HCL ER 37.5 MG PO CP24: 37.5000 mg | ORAL_CAPSULE | Freq: Every day | ORAL | Status: DC

## 2013-09-17 MED ORDER — HYDROXYCHLOROQUINE SULFATE 200 MG PO TABS: 200.0000 mg | ORAL_TABLET | Freq: Two times a day (BID) | ORAL | Status: DC

## 2013-09-17 MED ORDER — AZATHIOPRINE 50 MG PO TABS: 150.0000 mg | ORAL_TABLET | Freq: Every day | ORAL | Status: DC

## 2013-09-17 MED ORDER — AZATHIOPRINE 50 MG PO TABS
150.0000 mg | ORAL_TABLET | Freq: Every day | ORAL | Status: DC
  Administered 2013-09-17: 150 mg via ORAL
  Filled 2013-09-17 (×2): qty 3

## 2013-09-17 MED ORDER — HYDROXYCHLOROQUINE SULFATE 200 MG PO TABS
200.0000 mg | ORAL_TABLET | Freq: Two times a day (BID) | ORAL | Status: DC
  Administered 2013-09-17: 200 mg via ORAL
  Filled 2013-09-17 (×3): qty 1

## 2013-09-17 MED ORDER — PREDNISONE 20 MG PO TABS
20.0000 mg | ORAL_TABLET | Freq: Every day | ORAL | Status: DC
  Administered 2013-09-17: 20 mg via ORAL
  Filled 2013-09-17 (×2): qty 1

## 2013-09-17 MED ORDER — PREDNISONE 10 MG PO TABS: 20.0000 mg | ORAL_TABLET | Freq: Every day | ORAL | Status: DC
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
## 2013-09-17 NOTE — Progress Notes (Signed)
Adult Psychoeducational Group Note  Date:  09/17/2013 Time:  3:02 PM  Group Topic/Focus:  Overcoming Stress:   The focus of this group is to define stress and help patients assess their triggers.  Participation Level:  Active  Participation Quality:  Appropriate, Sharing and Supportive  Affect:  Appropriate  Cognitive:  Appropriate  Insight: Appropriate and Good  Engagement in Group:  Engaged and Supportive  Modes of Intervention:  Discussion, Education, Socialization and Support  Additional Comments:  Pt actively contributed to group discussion. Pt stated that her main stressor is her lupus and that she likes to listen to music to relieve stress.  Caswell Corwin 09/17/2013, 3:02 PM
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
## 2013-09-17 NOTE — Progress Notes (Signed)
[facility] Adult Case Management Discharge Plan :  Will you be returning to the same living situation after discharge: Yes,  Patient is returning to her home. At discharge, do you have transportation home?:Yes,  Patient mother to transport her home. Do you have the ability to pay for your medications:Yes,  Patient has Medicaid  Release of information consent forms completed and in the chart;  Patient's signature needed at discharge.  Patient to Follow up at: Follow-up Information   Follow up with [facility] On 09/18/2013. (Please go to [facility] walk in clinic on Friday, Septemer 9, 2014 or any weekday between 8 AM-12:00 PM for counseling and medication managment )    Contact information:   [address] [place], [place]  [address]   [phone]      Patient denies SI/HI:   Patient is not endorsing SI/HI or thoughts of self harm.    Safety Planning and Suicide Prevention discussed: .Reviewed with all patients during discharge planning group   Melena Hayes, Joesph July 09/17/2013, 11:45 AM

## 2013-09-17 NOTE — [facility] Suicide Risk Assessment (Signed)
[facility] INPATIENT:  Family/Significant Other Suicide Prevention Education  Suicide Prevention Education:  Education Completed; Jerolyn Shin, Mother, [phone]) has been identified by the patient as the family member/significant other with whom the patient will be residing, and identified as the person(s) who will aid the patient in the event of a mental health crisis (suicidal ideations/suicide attempt).  With written consent from the patient, the family member/significant other has been provided the following suicide prevention education, prior to the and/or following the discharge of the patient.  The suicide prevention education provided includes the following:  Suicide risk factors  Suicide prevention and interventions  National Suicide Hotline telephone number  [facility] assessment telephone number  [facility] Emergency Assistance 911  [facility] and/or Residential Mobile Crisis Unit telephone number  Request made of family/significant other to:  Remove weapons (e.g., guns, rifles, knives), all items previously/currently identified as safety concern.  Mother advised patient does not have access to guns.  Remove drugs/medications (over-the-counter, prescriptions, illicit drugs), all items previously/currently identified as a safety concern.  The family member/significant other verbalizes understanding of the suicide prevention education information provided.  The family member/significant other agrees to remove the items of safety concern listed above.  Wynn Banker 09/17/2013, 11:47 AM
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
## 2013-09-18 LAB — URINE CULTURE: Colony Count: >=100000

## 2013-09-18 NOTE — ED Notes (Signed)
Post ED Visit - Positive Culture Follow-up: Successful Patient Follow-Up  Culture assessed and recommendations reviewed by: []  Wes Dulaney, Pharm.D., BCPS []  Celedonio Miyamoto, Pharm.D., BCPS [x]  Georgina Pillion, Pharm.D., BCPS []  [place], [place].D., BCPS, AAHIVP []  Estella Husk, Pharm.D., BCPS, AAHIVP  Positive Urine culture  []  Patient discharged without antimicrobial prescription and treatment is now indicated [x]  Organism is resistant to prescribed ED discharge antimicrobial []  Patient with positive blood cultures  Changes discussed with ED provider: Roxy Horseman New antibiotic prescription Amoxicillin 500 mg po BID x 10 days    Larena Sox 09/18/2013, 5:40 PM
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
## 2013-09-19 ENCOUNTER — Telehealth (HOSPITAL_COMMUNITY): Payer: Self-pay | Admitting: *Deleted

## 2013-09-20 ENCOUNTER — Telehealth (HOSPITAL_COMMUNITY): Payer: Self-pay | Admitting: Emergency Medicine

## 2013-09-22 NOTE — ED Notes (Signed)
Unable to contact via phone letter sent to [facility] address. 

## 2013-09-22 NOTE — Progress Notes (Addendum)
Patient Discharge Instructions:  After Visit Summary (AVS):   Faxed to:  09/22/13 Psychiatric Admission Assessment Note:   Faxed to:  09/22/13 Suicide Risk Assessment - Discharge Assessment:   Faxed to:  09/22/13 Faxed/Sent to the [facility] Care provider:  09/22/13 Faxed to [facility] of the [facility] @ [phone]  Jerelene Redden, 09/22/2013, 2:47 PM

## 2013-12-31 DIAGNOSIS — Z8619 Personal history of other infectious and parasitic diseases: Secondary | ICD-10-CM

## 2013-12-31 HISTORY — DX: Personal history of other infectious and parasitic diseases: Z86.19

## 2014-03-02 ENCOUNTER — Inpatient Hospital Stay (HOSPITAL_COMMUNITY)
Admission: AD | Admit: 2014-03-02 | Discharge: 2014-03-02 | Disposition: A | Discharge status: Home / Self Care | Payer: Medicaid - Out of State | Source: Ambulatory Visit | Attending: Obstetrics and Gynecology | Admitting: Obstetrics and Gynecology

## 2014-03-02 ENCOUNTER — Encounter (HOSPITAL_COMMUNITY): Payer: Self-pay | Admitting: *Deleted

## 2014-03-02 DIAGNOSIS — N930 Postcoital and contact bleeding: Secondary | ICD-10-CM | POA: Insufficient documentation

## 2014-03-02 DIAGNOSIS — F17200 Nicotine dependence, unspecified, uncomplicated: Secondary | ICD-10-CM | POA: Insufficient documentation

## 2014-03-02 DIAGNOSIS — N926 Irregular menstruation, unspecified: Secondary | ICD-10-CM | POA: Insufficient documentation

## 2014-03-02 DIAGNOSIS — A5901 Trichomonal vulvovaginitis: Secondary | ICD-10-CM | POA: Insufficient documentation

## 2014-03-02 DIAGNOSIS — L293 Anogenital pruritus, unspecified: Secondary | ICD-10-CM | POA: Insufficient documentation

## 2014-03-02 DIAGNOSIS — R109 Unspecified abdominal pain: Secondary | ICD-10-CM | POA: Insufficient documentation

## 2014-03-02 HISTORY — DX: Unspecified osteoarthritis, unspecified site: M19.90

## 2014-03-02 LAB — URINALYSIS, ROUTINE W REFLEX MICROSCOPIC
Bilirubin Urine: NEGATIVE
GLUCOSE, UA: NEGATIVE mg/dL
HGB URINE DIPSTICK: NEGATIVE
Ketones, ur: NEGATIVE mg/dL
Nitrite: NEGATIVE
PROTEIN: 30 mg/dL — AB
UROBILINOGEN UA: 0.2 mg/dL (ref 0.0–1.0)
pH: 6 (ref 5.0–8.0)

## 2014-03-02 LAB — URINE MICROSCOPIC-ADD ON

## 2014-03-02 LAB — WET PREP, GENITAL
Clue Cells Wet Prep HPF POC: NONE SEEN
YEAST WET PREP: NONE SEEN

## 2014-03-02 LAB — CBC
HCT: 33.7 % — ABNORMAL LOW (ref 36.0–46.0)
Hemoglobin: 10.9 g/dL — ABNORMAL LOW (ref 12.0–15.0)
MCH: 31.9 pg (ref 26.0–34.0)
MCHC: 32.3 g/dL (ref 30.0–36.0)
MCV: 98.5 fL (ref 78.0–100.0)
PLATELETS: 216 10*3/uL (ref 150–400)
RBC: 3.42 MIL/uL — AB (ref 3.87–5.11)
RDW: 13.7 % (ref 11.5–15.5)
WBC: 5.7 10*3/uL (ref 4.0–10.5)

## 2014-03-02 LAB — POCT PREGNANCY, URINE: Preg Test, Ur: NEGATIVE

## 2014-03-02 MED ORDER — METRONIDAZOLE 500 MG PO TABS
2000.0000 mg | ORAL_TABLET | Freq: Once | ORAL | Status: AC
  Administered 2014-03-02: 2000 mg via ORAL
  Filled 2014-03-02: qty 4

## 2014-03-02 NOTE — Discharge Instructions (Signed)
Sexually Transmitted Disease A sexually transmitted disease (STD) is a disease or infection. It may be passed from person to person. It usually is passed during sex. STDs can be spread by different types of germs. These germs are bacteria, viruses, and parasites. An STD can be passed through:  Spit (saliva).  Semen.  Blood.  Mucus from the vagina.  Pee (urine). HOW CAN I LESSEN MY CHANCES OF GETTING AN STD?  Only use condoms labeled "latex," dental dams, and lubricants that wash away with water (water soluble). Do not use petroleum jelly or oils.  Get shots (vaccines) for HPV and hepatitis.  Avoid risky sex behavior that can break the skin. WHAT SHOULD I DO IF I THINK I HAVE AN STD?  See your doctor.  Tell your sex partner(s) that you have an STD. They should be tested and treated.  Do not have sex until your doctor says it is OK. WHEN SHOULD I GET HELP? Get help if:  You have bad belly (abdominal) pain.  You are a man and have puffiness (swelling) or pain in your testicles.  You are a woman and have puffiness in your vagina. MAKE SURE YOU:  Understand these instructions. Document Released: 01/24/2005 Document Revised: 10/07/2013 Document Reviewed: 06/12/2013 [employer] Patient Information 2014 [employer].  

## 2014-03-02 NOTE — MAU Provider Note (Signed)
History     CSN: [id number]  Arrival date and time: 03/02/14 1607   First Provider Initiated Contact with Patient 03/02/14 1104      Chief Complaint  Patient presents with  . Abdominal Pain  . bleeding with intercourse    HPI   Ms. Evelyn [name] is a 33 y.o. female is a 33 y.o. female who presents with bleeding following intercourse and abdominal pain. She also complaints of vaginal itching with an abnormal discharge. Pt has a history of irregular menstrual cycles; last cycle was in December.   OB History   Grav Para Term Preterm Abortions TAB SAB Ect Mult Living                  Past Medical History  Diagnosis Date  . Headache(784.0)   . Anxiety   . Anemia   . Depression   . Lupus   . Lupus nephritis   . Pericardial effusion     Intermittent. Associated with lupus.  . Arthritis     rheumatoid    Past Surgical History  Procedure Laterality Date  . Dilation and evacuation  11/10  . Laparoscopic tubal ligation  07/10/2011    Procedure: LAPAROSCOPIC TUBAL LIGATION;  Surgeon: Purcell Nails, MD;  Location: [facility];  Service: Gynecology;  Laterality: Bilateral;  fulguration  . Tubal ligation      Family History  Problem Relation Age of Onset  . Hypertension Mother     History  Substance Use Topics  . Smoking status: Current Every Day Smoker -- 0.25 packs/day for 7 years    Types: Cigarettes    Last Attempt to Quit: 06/20/2011  . Smokeless tobacco: Not on file  . Alcohol Use: Yes     Comment: occasional     Allergies:  Allergies  Allergen Reactions  . Ciprofloxacin Diarrhea and Nausea And Vomiting  . Metoclopramide Hcl     Body started contracting, couldn't breathe, throat swelled up  . Nsaids     Flares up her lupus  . Pork-Derived Products Diarrhea and Nausea And Vomiting  . Sulfur     Flares up her lupus  . Tramadol     Drug interaction with kidney medicine    Prescriptions prior to admission  Medication Sig Dispense Refill  .  acetaminophen (TYLENOL) 500 MG tablet Take 2,000-3,000 mg by mouth 2 (two) times daily as needed (lupus pain).      Marland Kitchen azaTHIOprine (IMURAN) 50 MG tablet Take 3 tablets (150 mg total) by mouth daily. For lupus nephritis.      . hydroxychloroquine (PLAQUENIL) 200 MG tablet Take 1 tablet (200 mg total) by mouth 2 (two) times daily. For lupus.      . predniSONE (DELTASONE) 10 MG tablet Take 15 mg by mouth daily. For Lupus exacerbation.      . [DISCONTINUED] predniSONE (DELTASONE) 10 MG tablet Take 2 tablets (20 mg total) by mouth daily. For Lupus exacerbation.        Results for orders placed during the hospital encounter of 03/02/14 (from the past 48 hour(s))  URINALYSIS, ROUTINE W REFLEX MICROSCOPIC     Status: Abnormal   Collection Time    03/02/14 10:05 AM      Result Value Ref Range   Color, Urine YELLOW  YELLOW   APPearance CLEAR  CLEAR   Specific Gravity, Urine >1.030 (*) 1.005 - 1.030   pH 6.0  5.0 - 8.0   Glucose, UA NEGATIVE  NEGATIVE mg/dL   Hgb  urine dipstick NEGATIVE  NEGATIVE   Bilirubin Urine NEGATIVE  NEGATIVE   Ketones, ur NEGATIVE  NEGATIVE mg/dL   Protein, ur 30 (*) NEGATIVE mg/dL   Urobilinogen, UA 0.2  0.0 - 1.0 mg/dL   Nitrite NEGATIVE  NEGATIVE   Leukocytes, UA SMALL (*) NEGATIVE  URINE MICROSCOPIC-ADD ON     Status: Abnormal   Collection Time    03/02/14 10:05 AM      Result Value Ref Range   Squamous Epithelial / LPF FEW (*) RARE   WBC, UA 3-6  <3 WBC/hpf   RBC / HPF 0-2  <3 RBC/hpf   Bacteria, UA FEW (*) RARE   Urine-Other MUCOUS PRESENT     Comment: TRICHOMONAS PRESENT  POCT PREGNANCY, URINE     Status: None   Collection Time    03/02/14 10:19 AM      Result Value Ref Range   Preg Test, Ur NEGATIVE  NEGATIVE   Comment:            THE SENSITIVITY OF THIS     METHODOLOGY IS >24 mIU/mL  CBC     Status: Abnormal   Collection Time    03/02/14 10:31 AM      Result Value Ref Range   WBC 5.7  4.0 - 10.5 K/uL   RBC 3.42 (*) 3.87 - 5.11 MIL/uL   Hemoglobin  10.9 (*) 12.0 - 15.0 g/dL   HCT 84.1 (*) 32.4 - 40.1 %   MCV 98.5  78.0 - 100.0 fL   MCH 31.9  26.0 - 34.0 pg   MCHC 32.3  30.0 - 36.0 g/dL   RDW 02.7  25.3 - 66.4 %   Platelets 216  150 - 400 K/uL  WET PREP, GENITAL     Status: Abnormal   Collection Time    03/02/14 11:20 AM      Result Value Ref Range   Yeast Wet Prep HPF POC NONE SEEN  NONE SEEN   Trich, Wet Prep TOO NUMEROUS TO COUNT (*) NONE SEEN   Clue Cells Wet Prep HPF POC NONE SEEN  NONE SEEN   WBC, Wet Prep HPF POC MODERATE (*) NONE SEEN   Comment: FEW BACTERIA SEEN    Review of Systems  Constitutional: Negative for fever and chills.  Gastrointestinal: Positive for abdominal pain (right- mid abdominal pain/cramping, going on for > 1 week ). Negative for nausea, vomiting, diarrhea and constipation.  Genitourinary: Negative for dysuria, urgency, frequency and hematuria.       + vaginal discharge; yellow No vaginal bleeding; scant amount, notices it when she urinates.  No dysuria.    Physical Exam   Blood pressure 111/70, pulse 82, temperature 98.1 F (36.7 C), temperature source Oral, resp. rate 18, height 5' 5.5" (1.664 m), weight 58.06 kg (128 lb), last menstrual period 12/13/2013.  Physical Exam  Constitutional: She is oriented to person, place, and time. She appears well-developed and well-nourished. No distress.  HENT:  Head: Normocephalic.  Eyes: Pupils are equal, round, and reactive to light.  Neck: Neck supple.  Respiratory: Effort normal.  GI: Soft. She exhibits no distension and no mass. There is no tenderness. There is no rebound and no guarding.  Genitourinary: Vaginal discharge found.  Speculum exam: Vagina - Large amount of creamy, bubbly brown discharge, no odor Cervix - + contact bleeding, friable cervix  Bimanual exam: Cervix closed Uterus non tender, normal size Mild right adnexal tenderness, no masses bilaterally GC/Chlam, wet prep done Chaperone present for  exam.   Musculoskeletal: Normal  range of motion.  Neurological: She is alert and oriented to person, place, and time.  Skin: Skin is warm. She is not diaphoretic.  Psychiatric: Her behavior is normal.    MAU Course  Procedures None   MDM UA UPT CBC Wet prep GC/Chlamydia  + trichomonas  Flagyl 2 gram PO in MAU   Assessment and Plan   A:  1. Trichomonas vaginitis   2. PCB (post coital bleeding)     P:  Discharge home in stable condition Pt treated in MAU for trichomonas; partner needs treatment.  Return to MAU as needed, if symptoms worsen Condoms always   Iona Hansen Lenae Wherley, NP  03/02/2014, 12:11 PM

## 2014-03-02 NOTE — MAU Note (Signed)
RLQ pain x 1 month, having bleeding with intercourse for the past 2 weeks, heavy with clots.  Denies pain with intercourse.

## 2014-03-03 LAB — GC/CHLAMYDIA PROBE AMP
CT PROBE, AMP APTIMA: NEGATIVE
GC PROBE AMP APTIMA: NEGATIVE

## 2014-03-08 NOTE — MAU Provider Note (Signed)
Attestation of Attending Supervision of Advanced Practitioner (CNM/NP): Evaluation and management procedures were performed by the Advanced Practitioner under my supervision and collaboration.  I have reviewed the Advanced Practitioner's note and chart, and I agree with the management and plan.  Nakia Remmers 03/08/2014 12:44 PM

## 2014-04-26 ENCOUNTER — Emergency Department (HOSPITAL_COMMUNITY): Payer: Medicaid - Out of State

## 2014-04-26 ENCOUNTER — Encounter (HOSPITAL_COMMUNITY): Payer: Self-pay | Admitting: Emergency Medicine

## 2014-04-26 ENCOUNTER — Emergency Department (HOSPITAL_COMMUNITY)
Admission: EM | Admit: 2014-04-26 | Discharge: 2014-04-26 | Disposition: A | Discharge status: Home / Self Care | Payer: Medicaid - Out of State | Attending: Emergency Medicine | Admitting: Emergency Medicine

## 2014-04-26 DIAGNOSIS — Z3202 Encounter for pregnancy test, result negative: Secondary | ICD-10-CM | POA: Insufficient documentation

## 2014-04-26 DIAGNOSIS — R062 Wheezing: Secondary | ICD-10-CM | POA: Insufficient documentation

## 2014-04-26 DIAGNOSIS — N058 Unspecified nephritic syndrome with other morphologic changes: Secondary | ICD-10-CM | POA: Insufficient documentation

## 2014-04-26 DIAGNOSIS — Z862 Personal history of diseases of the blood and blood-forming organs and certain disorders involving the immune mechanism: Secondary | ICD-10-CM | POA: Insufficient documentation

## 2014-04-26 DIAGNOSIS — M069 Rheumatoid arthritis, unspecified: Secondary | ICD-10-CM | POA: Insufficient documentation

## 2014-04-26 DIAGNOSIS — F17200 Nicotine dependence, unspecified, uncomplicated: Secondary | ICD-10-CM | POA: Insufficient documentation

## 2014-04-26 DIAGNOSIS — Z79899 Other long term (current) drug therapy: Secondary | ICD-10-CM | POA: Insufficient documentation

## 2014-04-26 DIAGNOSIS — M329 Systemic lupus erythematosus, unspecified: Secondary | ICD-10-CM | POA: Insufficient documentation

## 2014-04-26 DIAGNOSIS — IMO0002 Reserved for concepts with insufficient information to code with codable children: Secondary | ICD-10-CM | POA: Insufficient documentation

## 2014-04-26 DIAGNOSIS — Z8679 Personal history of other diseases of the circulatory system: Secondary | ICD-10-CM | POA: Insufficient documentation

## 2014-04-26 DIAGNOSIS — Z8659 Personal history of other mental and behavioral disorders: Secondary | ICD-10-CM | POA: Insufficient documentation

## 2014-04-26 DIAGNOSIS — B9789 Other viral agents as the cause of diseases classified elsewhere: Secondary | ICD-10-CM | POA: Insufficient documentation

## 2014-04-26 DIAGNOSIS — B349 Viral infection, unspecified: Secondary | ICD-10-CM

## 2014-04-26 DIAGNOSIS — R55 Syncope and collapse: Secondary | ICD-10-CM | POA: Insufficient documentation

## 2014-04-26 LAB — CBC WITH DIFFERENTIAL/PLATELET
BASOS ABS: 0 10*3/uL (ref 0.0–0.1)
Basophils Relative: 0 % (ref 0–1)
EOS PCT: 0 % (ref 0–5)
Eosinophils Absolute: 0 10*3/uL (ref 0.0–0.7)
HCT: 34.4 % — ABNORMAL LOW (ref 36.0–46.0)
Hemoglobin: 11.1 g/dL — ABNORMAL LOW (ref 12.0–15.0)
LYMPHS ABS: 0.4 10*3/uL — AB (ref 0.7–4.0)
Lymphocytes Relative: 8 % — ABNORMAL LOW (ref 12–46)
MCH: 32.4 pg (ref 26.0–34.0)
MCHC: 32.3 g/dL (ref 30.0–36.0)
MCV: 100.3 fL — AB (ref 78.0–100.0)
Monocytes Absolute: 0.1 10*3/uL (ref 0.1–1.0)
Monocytes Relative: 2 % — ABNORMAL LOW (ref 3–12)
NEUTROS ABS: 4.8 10*3/uL (ref 1.7–7.7)
NEUTROS PCT: 89 % — AB (ref 43–77)
PLATELETS: 247 10*3/uL (ref 150–400)
RBC: 3.43 MIL/uL — AB (ref 3.87–5.11)
RDW: 13.8 % (ref 11.5–15.5)
WBC: 5.3 10*3/uL (ref 4.0–10.5)

## 2014-04-26 LAB — URINE MICROSCOPIC-ADD ON

## 2014-04-26 LAB — URINALYSIS, ROUTINE W REFLEX MICROSCOPIC
BILIRUBIN URINE: NEGATIVE
GLUCOSE, UA: NEGATIVE mg/dL
Ketones, ur: NEGATIVE mg/dL
Leukocytes, UA: NEGATIVE
Nitrite: NEGATIVE
PROTEIN: 30 mg/dL — AB
Specific Gravity, Urine: 1.024 (ref 1.005–1.030)
UROBILINOGEN UA: 0.2 mg/dL (ref 0.0–1.0)
pH: 6.5 (ref 5.0–8.0)

## 2014-04-26 LAB — COMPREHENSIVE METABOLIC PANEL
ALK PHOS: 52 U/L (ref 39–117)
ALT: 10 U/L (ref 0–35)
AST: 19 U/L (ref 0–37)
Albumin: 3.8 g/dL (ref 3.5–5.2)
BUN: 8 mg/dL (ref 6–23)
CALCIUM: 8.8 mg/dL (ref 8.4–10.5)
CHLORIDE: 100 meq/L (ref 96–112)
CO2: 25 meq/L (ref 19–32)
Creatinine, Ser: 0.71 mg/dL (ref 0.50–1.10)
GLUCOSE: 98 mg/dL (ref 70–99)
POTASSIUM: 3.5 meq/L — AB (ref 3.7–5.3)
Sodium: 138 mEq/L (ref 137–147)
Total Bilirubin: <0.2 mg/dL — ABNORMAL LOW (ref 0.3–1.2)
Total Protein: 7.7 g/dL (ref 6.0–8.3)

## 2014-04-26 LAB — CBG MONITORING, ED: GLUCOSE-CAPILLARY: 86 mg/dL (ref 70–99)

## 2014-04-26 LAB — PREGNANCY, URINE: Preg Test, Ur: NEGATIVE

## 2014-04-26 LAB — LIPASE, BLOOD: Lipase: 32 U/L (ref 11–59)

## 2014-04-26 LAB — CK: CK TOTAL: 47 U/L (ref 7–177)

## 2014-04-26 MED ORDER — ONDANSETRON HCL 4 MG/2ML IJ SOLN
4.0000 mg | Freq: Once | INTRAMUSCULAR | Status: AC
  Administered 2014-04-26: 4 mg via INTRAVENOUS
  Filled 2014-04-26: qty 2

## 2014-04-26 MED ORDER — PROCHLORPERAZINE EDISYLATE 5 MG/ML IJ SOLN
10.0000 mg | Freq: Once | INTRAMUSCULAR | Status: AC
  Administered 2014-04-26: 10 mg via INTRAVENOUS
  Filled 2014-04-26: qty 2

## 2014-04-26 MED ORDER — SODIUM CHLORIDE 0.9 % IV BOLUS (SEPSIS)
1000.0000 mL | Freq: Once | INTRAVENOUS | Status: AC
  Administered 2014-04-26: 1000 mL via INTRAVENOUS

## 2014-04-26 MED ORDER — MORPHINE SULFATE 4 MG/ML IJ SOLN
4.0000 mg | Freq: Once | INTRAMUSCULAR | Status: AC
  Administered 2014-04-26: 4 mg via INTRAVENOUS
  Filled 2014-04-26: qty 1

## 2014-04-26 MED ORDER — OXYCODONE-ACETAMINOPHEN 5-325 MG PO TABS: 1.0000 | ORAL_TABLET | Freq: Four times a day (QID) | ORAL | Status: DC | PRN

## 2014-04-26 MED ORDER — PROMETHAZINE HCL 25 MG PO TABS: 25.0000 mg | ORAL_TABLET | Freq: Four times a day (QID) | ORAL | Status: DC | PRN

## 2014-04-26 NOTE — ED Provider Notes (Signed)
CSN: [id number]     Arrival date & time 04/26/14  1638 History   First MD Initiated Contact with Patient 04/26/14 [phone]     Chief Complaint  Patient presents with  . Nausea     (Consider location/radiation/quality/duration/timing/severity/associated sxs/prior Treatment) The history is provided by the patient.   patient states that she's been feeling bad for last 4 days. States she has had a headache and a mild cough. She states she's felt fatigued. No fevers. The cough is nonproductive. No chest pain. She states that today she walked her child to school and then after that she began to feel nauseous. She states she was sweaty and felt like she was going to pass out. She states she was going upstairs when she fell down the stairs. She states she woke to her dog licking her on the face. No numbness or weakness. No confusion. The headache is dull and throbbing. She's not had vomiting does have nausea. No diarrhea constipation. No dysuria. She has a history of lupus. She is on chronic medication for it. She states that her muscles all hurt. She states like she did a good workout at gym.  Past Medical History  Diagnosis Date  . Headache(784.0)   . Anxiety   . Anemia   . Depression   . Lupus   . Lupus nephritis   . Pericardial effusion     Intermittent. Associated with lupus.  . Arthritis     rheumatoid   Past Surgical History  Procedure Laterality Date  . Dilation and evacuation  11/10  . Laparoscopic tubal ligation  07/10/2011    Procedure: LAPAROSCOPIC TUBAL LIGATION;  Surgeon: Purcell Nails, MD;  Location: [facility];  Service: Gynecology;  Laterality: Bilateral;  fulguration  . Tubal ligation     Family History  Problem Relation Age of Onset  . Hypertension Mother    History  Substance Use Topics  . Smoking status: Current Every Day Smoker -- 0.25 packs/day for 7 years    Types: Cigarettes    Last Attempt to Quit: 06/20/2011  . Smokeless tobacco: Not on file  . Alcohol Use: Yes      Comment: occasional    OB History   Grav Para Term Preterm Abortions TAB SAB Ect Mult Living                 Review of Systems  Constitutional: Positive for appetite change and fatigue. Negative for activity change.  Eyes: Negative for pain.  Respiratory: Positive for cough. Negative for chest tightness and shortness of breath.   Cardiovascular: Negative for chest pain and leg swelling.  Gastrointestinal: Positive for nausea. Negative for vomiting, abdominal pain and diarrhea.  Genitourinary: Negative for flank pain.  Musculoskeletal: Negative for back pain, neck pain and neck stiffness.  Skin: Negative for rash.  Neurological: Positive for syncope and headaches. Negative for weakness and numbness.  Psychiatric/Behavioral: Negative for behavioral problems.      Allergies  Metoclopramide hcl; Ciprofloxacin; Nsaids; Pork-derived products; Sulfur; and Tramadol  Home Medications   Prior to Admission medications   Medication Sig Start Date End Date Taking? Authorizing Provider  acetaminophen (TYLENOL) 500 MG tablet Take 2,000-3,000 mg by mouth 2 (two) times daily as needed (lupus pain).   Yes Historical Provider, MD  azaTHIOprine (IMURAN) 50 MG tablet Take 150 mg by mouth daily.   Yes Historical Provider, MD  hydroxychloroquine (PLAQUENIL) 200 MG tablet Take 1 tablet (200 mg total) by mouth 2 (two) times daily. For  lupus. 09/17/13  Yes Verne Spurr, PA-C  predniSONE (DELTASONE) 10 MG tablet Take 15 mg by mouth daily. For Lupus exacerbation. 09/17/13  Yes Neil Mashburn, PA-C   BP 105/62  Pulse 79  Temp(Src) 99.2 F (37.3 C) (Oral)  Resp 16  SpO2 100%  LMP 04/26/2014 Physical Exam  Nursing note and vitals reviewed. Constitutional: She is oriented to person, place, and time. She appears well-developed and well-nourished.  HENT:  Head: Normocephalic and atraumatic.  Eyes: EOM are normal. Pupils are equal, round, and reactive to light.  Neck: Normal range of motion. Neck  supple.  Cardiovascular: Normal rate, regular rhythm and normal heart sounds.   No murmur heard. Pulmonary/Chest: Effort normal. No respiratory distress. She has wheezes. She has no rales.  Few scattered wheezes. No respiratory distress.  Abdominal: Soft. Bowel sounds are normal. She exhibits no distension. There is no tenderness. There is no rebound and no guarding.  Musculoskeletal: Normal range of motion.  Neurological: She is alert and oriented to person, place, and time. No cranial nerve deficit.  Skin: Skin is warm and dry.  Psychiatric: She has a normal mood and affect. Her speech is normal.    ED Course  Procedures (including critical care time) Labs Review Labs Reviewed  CBC WITH DIFFERENTIAL - Abnormal; Notable for the following:    RBC 3.43 (*)    Hemoglobin 11.1 (*)    HCT 34.4 (*)    MCV 100.3 (*)    Neutrophils Relative % 89 (*)    Lymphocytes Relative 8 (*)    Lymphs Abs 0.4 (*)    Monocytes Relative 2 (*)    All other components within normal limits  COMPREHENSIVE METABOLIC PANEL - Abnormal; Notable for the following:    Potassium 3.5 (*)    Total Bilirubin <0.2 (*)    All other components within normal limits  URINALYSIS, ROUTINE W REFLEX MICROSCOPIC - Abnormal; Notable for the following:    APPearance CLOUDY (*)    Hgb urine dipstick LARGE (*)    Protein, ur 30 (*)    All other components within normal limits  URINE MICROSCOPIC-ADD ON - Abnormal; Notable for the following:    Bacteria, UA FEW (*)    All other components within normal limits  LIPASE, BLOOD  PREGNANCY, URINE  CK  CBG MONITORING, ED    Imaging Review Dg Chest 2 View  04/26/2014   CLINICAL DATA:  Lupus, cough  EXAM: CHEST  2 VIEW  COMPARISON:  05/05/2012  FINDINGS: The heart size and mediastinal contours are within normal limits. Both lungs are clear. The visualized skeletal structures are unremarkable.  IMPRESSION: No active cardiopulmonary disease.   Electronically Signed   By: Ruel Favors M.D.   On: 04/26/2014 09:18     EKG Interpretation None      MDM   Final diagnoses:  Viral infection    Patient with URI symptoms along with nausea. Aches all over. That work overall reassuring. Doubt meningitis. Patient feels somewhat better after treatment. May be of viral origin. Will discharge home with pain medicine and antibiotics. Due to the patient's immunosuppression I would have a lower threshold for closer monitoring.  Juliet Rude. Rubin Payor, MD 04/26/14 1432

## 2014-04-26 NOTE — ED Notes (Signed)
Pt reports nausea improved, able to tolerate crackers and gingerale.  Pt continues to c/o mild pain.  NAD.  Awaiting dispo.

## 2014-04-26 NOTE — Discharge Instructions (Signed)

## 2014-04-26 NOTE — ED Notes (Signed)
Initial Contact - pt resting on stretcher, A+Ox4, c/o 8/10 pain to head. Pt denies other complaints at this time.  Neuros grossly intact.  Skin PWD.  MAEI, self repositioning for comfort.  Speaking full/clear sentences.  Pt given PO challenge at this time, tolerating well.  NAD.

## 2014-04-26 NOTE — ED Notes (Signed)
Pt reports nausea with attempting to drink.  EDP aware.

## 2014-04-26 NOTE — ED Notes (Signed)
Per pt, states she has bee feeling bad for 4 days-walked daughter to school and when she got home she vomited and then passed out-doesn't know if she hit head-states she woke up in pain

## 2014-07-28 ENCOUNTER — Encounter (HOSPITAL_COMMUNITY): Payer: Self-pay | Admitting: Emergency Medicine

## 2014-07-28 ENCOUNTER — Emergency Department (HOSPITAL_COMMUNITY)
Admission: EM | Admit: 2014-07-28 | Discharge: 2014-07-28 | Disposition: A | Discharge status: Home / Self Care | Payer: Medicaid - Out of State | Attending: Emergency Medicine | Admitting: Emergency Medicine

## 2014-07-28 DIAGNOSIS — IMO0002 Reserved for concepts with insufficient information to code with codable children: Secondary | ICD-10-CM | POA: Diagnosis not present

## 2014-07-28 DIAGNOSIS — F329 Major depressive disorder, single episode, unspecified: Secondary | ICD-10-CM | POA: Diagnosis not present

## 2014-07-28 DIAGNOSIS — F17200 Nicotine dependence, unspecified, uncomplicated: Secondary | ICD-10-CM | POA: Insufficient documentation

## 2014-07-28 DIAGNOSIS — B9689 Other specified bacterial agents as the cause of diseases classified elsewhere: Secondary | ICD-10-CM | POA: Diagnosis not present

## 2014-07-28 DIAGNOSIS — G43809 Other migraine, not intractable, without status migrainosus: Secondary | ICD-10-CM | POA: Insufficient documentation

## 2014-07-28 DIAGNOSIS — Z9889 Other specified postprocedural states: Secondary | ICD-10-CM | POA: Insufficient documentation

## 2014-07-28 DIAGNOSIS — M329 Systemic lupus erythematosus, unspecified: Secondary | ICD-10-CM | POA: Insufficient documentation

## 2014-07-28 DIAGNOSIS — N898 Other specified noninflammatory disorders of vagina: Secondary | ICD-10-CM | POA: Diagnosis present

## 2014-07-28 DIAGNOSIS — Z3202 Encounter for pregnancy test, result negative: Secondary | ICD-10-CM | POA: Insufficient documentation

## 2014-07-28 DIAGNOSIS — Z79899 Other long term (current) drug therapy: Secondary | ICD-10-CM | POA: Diagnosis not present

## 2014-07-28 DIAGNOSIS — N058 Unspecified nephritic syndrome with other morphologic changes: Secondary | ICD-10-CM | POA: Diagnosis not present

## 2014-07-28 DIAGNOSIS — Z862 Personal history of diseases of the blood and blood-forming organs and certain disorders involving the immune mechanism: Secondary | ICD-10-CM | POA: Insufficient documentation

## 2014-07-28 DIAGNOSIS — F3289 Other specified depressive episodes: Secondary | ICD-10-CM | POA: Insufficient documentation

## 2014-07-28 DIAGNOSIS — N760 Acute vaginitis: Secondary | ICD-10-CM | POA: Insufficient documentation

## 2014-07-28 DIAGNOSIS — N390 Urinary tract infection, site not specified: Secondary | ICD-10-CM | POA: Diagnosis not present

## 2014-07-28 DIAGNOSIS — A499 Bacterial infection, unspecified: Secondary | ICD-10-CM | POA: Insufficient documentation

## 2014-07-28 DIAGNOSIS — Z9851 Tubal ligation status: Secondary | ICD-10-CM | POA: Diagnosis not present

## 2014-07-28 DIAGNOSIS — M129 Arthropathy, unspecified: Secondary | ICD-10-CM | POA: Insufficient documentation

## 2014-07-28 DIAGNOSIS — F411 Generalized anxiety disorder: Secondary | ICD-10-CM | POA: Diagnosis not present

## 2014-07-28 LAB — BASIC METABOLIC PANEL
ANION GAP: 14 (ref 5–15)
BUN: 8 mg/dL (ref 6–23)
CALCIUM: 9 mg/dL (ref 8.4–10.5)
CO2: 25 mEq/L (ref 19–32)
CREATININE: 0.68 mg/dL (ref 0.50–1.10)
Chloride: 104 mEq/L (ref 96–112)
GFR calc Af Amer: >90 mL/min (ref >90)
GFR calc non Af Amer: >90 mL/min (ref >90)
Glucose, Bld: 90 mg/dL (ref 70–99)
Potassium: 3.9 mEq/L (ref 3.7–5.3)
Sodium: 143 mEq/L (ref 137–147)

## 2014-07-28 LAB — CBC
HCT: 33.4 % — ABNORMAL LOW (ref 36.0–46.0)
Hemoglobin: 10.8 g/dL — ABNORMAL LOW (ref 12.0–15.0)
MCH: 32.2 pg (ref 26.0–34.0)
MCHC: 32.3 g/dL (ref 30.0–36.0)
MCV: 99.7 fL (ref 78.0–100.0)
PLATELETS: 244 10*3/uL (ref 150–400)
RBC: 3.35 MIL/uL — ABNORMAL LOW (ref 3.87–5.11)
RDW: 14.5 % (ref 11.5–15.5)
WBC: 5.3 10*3/uL (ref 4.0–10.5)

## 2014-07-28 LAB — URINALYSIS, ROUTINE W REFLEX MICROSCOPIC
Glucose, UA: NEGATIVE mg/dL
Hgb urine dipstick: NEGATIVE
KETONES UR: NEGATIVE mg/dL
NITRITE: POSITIVE — AB
PROTEIN: 100 mg/dL — AB
Specific Gravity, Urine: 1.027 (ref 1.005–1.030)
Urobilinogen, UA: 1 mg/dL (ref 0.0–1.0)
pH: 6.5 (ref 5.0–8.0)

## 2014-07-28 LAB — WET PREP, GENITAL
Trich, Wet Prep: NONE SEEN
Yeast Wet Prep HPF POC: NONE SEEN

## 2014-07-28 LAB — URINE MICROSCOPIC-ADD ON

## 2014-07-28 LAB — HIV ANTIBODY (ROUTINE TESTING W REFLEX): HIV: NONREACTIVE

## 2014-07-28 LAB — PREGNANCY, URINE: Preg Test, Ur: NEGATIVE

## 2014-07-28 MED ORDER — CEPHALEXIN 500 MG PO CAPS: 500.0000 mg | ORAL_CAPSULE | Freq: Two times a day (BID) | ORAL | Status: DC

## 2014-07-28 MED ORDER — PROCHLORPERAZINE EDISYLATE 5 MG/ML IJ SOLN
10.0000 mg | Freq: Once | INTRAMUSCULAR | Status: AC
  Administered 2014-07-28: 10 mg via INTRAMUSCULAR
  Filled 2014-07-28: qty 2

## 2014-07-28 MED ORDER — DIPHENHYDRAMINE HCL 50 MG/ML IJ SOLN
25.0000 mg | Freq: Once | INTRAMUSCULAR | Status: AC
  Administered 2014-07-28: 25 mg via INTRAMUSCULAR
  Filled 2014-07-28: qty 1

## 2014-07-28 MED ORDER — AZITHROMYCIN 250 MG PO TABS
1000.0000 mg | ORAL_TABLET | Freq: Once | ORAL | Status: AC
  Administered 2014-07-28: 1000 mg via ORAL
  Filled 2014-07-28: qty 4

## 2014-07-28 MED ORDER — CEFTRIAXONE SODIUM 250 MG IJ SOLR
250.0000 mg | Freq: Once | INTRAMUSCULAR | Status: AC
  Administered 2014-07-28: 250 mg via INTRAMUSCULAR
  Filled 2014-07-28: qty 250

## 2014-07-28 MED ORDER — METRONIDAZOLE 500 MG PO TABS: 500.0000 mg | ORAL_TABLET | Freq: Two times a day (BID) | ORAL | Status: DC

## 2014-07-28 NOTE — Discharge Instructions (Signed)
Please follow up with your primary care physician in 1-2 days. If you do not have one please call the [facility] and wellness Center number listed above. Please follow up with the Ob/Gyn at [facility] to schedule a follow up appointment. Please take your antibiotic until completion. Please read all discharge instructions and return precautions.   Bacterial Vaginosis Bacterial vaginosis is a vaginal infection that occurs when the normal balance of bacteria in the vagina is disrupted. It results from an overgrowth of certain bacteria. This is the most common vaginal infection in women of childbearing age. Treatment is important to prevent complications, especially in pregnant women, as it can cause a premature delivery. CAUSES  Bacterial vaginosis is caused by an increase in harmful bacteria that are normally present in smaller amounts in the vagina. Several different kinds of bacteria can cause bacterial vaginosis. However, the reason that the condition develops is not fully understood. RISK FACTORS Certain activities or behaviors can put you at an increased risk of developing bacterial vaginosis, including:  Having a new sex partner or multiple sex partners.  Douching.  Using an intrauterine device (IUD) for contraception. Women do not get bacterial vaginosis from toilet seats, bedding, swimming pools, or contact with objects around them. SIGNS AND SYMPTOMS  Some women with bacterial vaginosis have no signs or symptoms. Common symptoms include:  Grey vaginal discharge.  A fishlike odor with discharge, especially after sexual intercourse.  Itching or burning of the vagina and vulva.  Burning or pain with urination. DIAGNOSIS  Your health care provider will take a medical history and examine the vagina for signs of bacterial vaginosis. A sample of vaginal fluid may be taken. Your health care provider will look at this sample under a microscope to check for bacteria and abnormal cells.  A vaginal pH test may also be done.  TREATMENT  Bacterial vaginosis may be treated with antibiotic medicines. These may be given in the form of a pill or a vaginal cream. A second round of antibiotics may be prescribed if the condition comes back after treatment.  HOME CARE INSTRUCTIONS   Only take over-the-counter or prescription medicines as directed by your health care provider.  If antibiotic medicine was prescribed, take it as directed. Make sure you finish it even if you start to feel better.  Do not have sex until treatment is completed.  Tell all sexual partners that you have a vaginal infection. They should see their health care provider and be treated if they have problems, such as a mild rash or itching.  Practice safe sex by using condoms and only having one sex partner. SEEK MEDICAL CARE IF:   Your symptoms are not improving after 3 days of treatment.  You have increased discharge or pain.  You have a fever. MAKE SURE YOU:   Understand these instructions.  Will watch your condition.  Will get help right away if you are not doing well or get worse. FOR MORE INFORMATION  Centers for Disease Control and Prevention, Division of STD Prevention: [employer]Apps[employer] American Sexual Health Association (ASHA): www.ashastd.org  Document Released: 12/17/2005 Document Revised: 10/07/2013 Document Reviewed: 07/29/2013 [facility] Patient Information 2015 [place], [place]. This information is not intended to replace advice given to you by your health care provider. Make sure you discuss any questions you have with your health care provider.  Urinary Tract Infection Urinary tract infections (UTIs) can develop anywhere along your urinary tract. Your urinary tract is your body's drainage system for removing wastes  and extra water. Your urinary tract includes two kidneys, two ureters, a bladder, and a urethra. Your kidneys are a pair of bean-shaped organs. Each kidney is about the size of your  fist. They are located below your ribs, one on each side of your spine. CAUSES Infections are caused by microbes, which are microscopic organisms, including fungi, viruses, and bacteria. These organisms are so small that they can only be seen through a microscope. Bacteria are the microbes that most commonly cause UTIs. SYMPTOMS  Symptoms of UTIs may vary by age and gender of the patient and by the location of the infection. Symptoms in young women typically include a frequent and intense urge to urinate and a painful, burning feeling in the bladder or urethra during urination. Older women and men are more likely to be tired, shaky, and weak and have muscle aches and abdominal pain. A fever may mean the infection is in your kidneys. Other symptoms of a kidney infection include pain in your back or sides below the ribs, nausea, and vomiting. DIAGNOSIS To diagnose a UTI, your caregiver will ask you about your symptoms. Your caregiver also will ask to provide a urine sample. The urine sample will be tested for bacteria and white blood cells. White blood cells are made by your body to help fight infection. TREATMENT  Typically, UTIs can be treated with medication. Because most UTIs are caused by a bacterial infection, they usually can be treated with the use of antibiotics. The choice of antibiotic and length of treatment depend on your symptoms and the type of bacteria causing your infection. HOME CARE INSTRUCTIONS  If you were prescribed antibiotics, take them exactly as your caregiver instructs you. Finish the medication even if you feel better after you have only taken some of the medication.  Drink enough water and fluids to keep your urine clear or pale yellow.  Avoid caffeine, tea, and carbonated beverages. They tend to irritate your bladder.  Empty your bladder often. Avoid holding urine for long periods of time.  Empty your bladder before and after sexual intercourse.  After a bowel  movement, women should cleanse from front to back. Use each tissue only once. SEEK MEDICAL CARE IF:   You have back pain.  You develop a fever.  Your symptoms do not begin to resolve within 3 days. SEEK IMMEDIATE MEDICAL CARE IF:   You have severe back pain or lower abdominal pain.  You develop chills.  You have nausea or vomiting.  You have continued burning or discomfort with urination. MAKE SURE YOU:   Understand these instructions.  Will watch your condition.  Will get help right away if you are not doing well or get worse. Document Released: 09/26/2005 Document Revised: 06/17/2012 Document Reviewed: 01/25/2012 [facility] Patient Information 2015 [place], [place]. This information is not intended to replace advice given to you by your health care provider. Make sure you discuss any questions you have with your health care provider.

## 2014-07-28 NOTE — ED Provider Notes (Signed)
Medical screening examination/treatment/procedure(s) were performed by non-physician practitioner and as supervising physician I was immediately available for consultation/collaboration.     Geoffery Lyons, MD 07/28/14 [phone]

## 2014-07-28 NOTE — ED Provider Notes (Signed)
CSN: [id number]     Arrival date & time 07/28/14  1159 History   First MD Initiated Contact with Patient 07/28/14 1523     Chief Complaint  Patient presents with  . Vaginal Bleeding     (Consider location/radiation/quality/duration/timing/severity/associated sxs/prior Treatment) HPI Comments: Patient is a [id number] 33 yo F PMHx significant for SLE, anemia, depression, anxiety presenting to the ED for three days of myalgias and urinary urgency w/o frequency or dysuria. Patient is also complaining about two weeks of intermittent vaginal spotting.  Alleviating factors: none. Aggravating factors: none. Medications tried prior to arrival: none. Patient does endorse recent unprotected sexual intercourse. Abdominal surgical history includes tubal ligation, D&E.     Patient is a 33 y.o. female presenting with vaginal bleeding.  Vaginal Bleeding Associated symptoms: no abdominal pain, no fever and no nausea     Past Medical History  Diagnosis Date  . Headache(784.0)   . Anxiety   . Anemia   . Depression   . Lupus   . Lupus nephritis   . Pericardial effusion     Intermittent. Associated with lupus.  . Arthritis     rheumatoid   Past Surgical History  Procedure Laterality Date  . Dilation and evacuation  11/10  . Laparoscopic tubal ligation  07/10/2011    Procedure: LAPAROSCOPIC TUBAL LIGATION;  Surgeon: Purcell Nails, MD;  Location: [facility];  Service: Gynecology;  Laterality: Bilateral;  fulguration  . Tubal ligation     Family History  Problem Relation Age of Onset  . Hypertension Mother    History  Substance Use Topics  . Smoking status: Current Every Day Smoker -- 0.25 packs/day for 7 years    Types: Cigarettes  . Smokeless tobacco: Not on file  . Alcohol Use: Yes     Comment: occasional    OB History   Grav Para Term Preterm Abortions TAB SAB Ect Mult Living                 Review of Systems  Constitutional: Negative for fever and chills.  Gastrointestinal: Negative  for nausea, vomiting, abdominal pain, diarrhea and constipation.  Genitourinary: Positive for urgency and vaginal bleeding. Negative for pelvic pain.  Musculoskeletal: Positive for myalgias.  All other systems reviewed and are negative.     Allergies  Metoclopramide hcl; Ciprofloxacin; Nsaids; Pork-derived products; Sulfur; and Tramadol  Home Medications   Prior to Admission medications   Medication Sig Start Date End Date Taking? Authorizing Provider  acetaminophen (TYLENOL) 500 MG tablet Take 2,000-3,000 mg by mouth 2 (two) times daily as needed (lupus pain).   Yes Historical Provider, MD  azaTHIOprine (IMURAN) 50 MG tablet Take 150 mg by mouth daily.   Yes Historical Provider, MD  diphenhydrAMINE (BENADRYL) 25 MG tablet Take 25 mg by mouth every 6 (six) hours as needed for allergies.   Yes Historical Provider, MD  hydroxychloroquine (PLAQUENIL) 200 MG tablet Take 1 tablet (200 mg total) by mouth 2 (two) times daily. For lupus. 09/17/13  Yes Neil Mashburn, PA-C  ondansetron (ZOFRAN) 4 MG tablet Take 4 mg by mouth every 8 (eight) hours as needed for nausea or vomiting.   Yes Historical Provider, MD  predniSONE (DELTASONE) 10 MG tablet Take 10 mg by mouth daily. For Lupus exacerbation. 09/17/13  Yes Verne Spurr, PA-C  prochlorperazine (COMPAZINE) 10 MG tablet Take 10 mg by mouth 2 (two) times daily as needed for nausea or vomiting.   Yes Historical Provider, MD  promethazine (PHENERGAN) 25 MG  tablet Take 1 tablet (25 mg total) by mouth every 6 (six) hours as needed for nausea. 04/26/14  Yes Nathan R. Rubin Payor, MD  venlafaxine XR (EFFEXOR-XR) 37.5 MG 24 hr capsule Take 37.5 mg by mouth daily with breakfast.   Yes Historical Provider, MD   BP 109/64  Pulse 94  Temp(Src) 98.5 F (36.9 C) (Oral)  Resp 16  Ht 5\' 5"  (1.651 m)  Wt 127 lb (57.607 kg)  BMI 21.13 kg/m2  SpO2 100%  LMP 07/14/2014 Physical Exam  Nursing note and vitals reviewed. Constitutional: She is oriented to person,  place, and time. She appears well-developed and well-nourished. No distress.  HENT:  Head: Normocephalic and atraumatic.  Right Ear: External ear normal.  Left Ear: External ear normal.  Nose: Nose normal.  Mouth/Throat: Oropharynx is clear and moist. No oropharyngeal exudate.  Eyes: Conjunctivae are normal.  Neck: Normal range of motion. Neck supple.  Cardiovascular: Normal rate, regular rhythm and normal heart sounds.   Pulmonary/Chest: Effort normal and breath sounds normal. No respiratory distress. She exhibits no tenderness.  Abdominal: Soft. Bowel sounds are normal. There is no tenderness. There is no rigidity, no rebound, no guarding and no CVA tenderness.  Musculoskeletal: Normal range of motion. She exhibits no edema.  Neurological: She is alert and oriented to person, place, and time.  Skin: Skin is warm and dry. She is not diaphoretic.  Psychiatric: She has a normal mood and affect.   Exam performed by 07/16/2014 L,  exam chaperoned Date: 07/28/2014 Pelvic exam: normal external genitalia without evidence of trauma. VULVA: normal appearing vulva with no masses, tenderness or lesion. VAGINA: normal appearing vagina with normal color and discharge, no lesions. CERVIX: normal appearing cervix without lesions, cervical motion tenderness absent, cervical os closed with out purulent discharge; vaginal discharge - white and creamy, Wet prep and DNA probe for chlamydia and GC obtained.   ADNEXA: normal adnexa in size, nontender and no masses UTERUS: uterus is normal size, shape, consistency and nontender.   ED Course  Procedures (including critical care time) Medications  prochlorperazine (COMPAZINE) injection 10 mg (10 mg Intramuscular Given 07/28/14 1649)  diphenhydrAMINE (BENADRYL) injection 25 mg (25 mg Intramuscular Given 07/28/14 1651)    Labs Review Labs Reviewed  WET PREP, GENITAL - Abnormal; Notable for the following:    Clue Cells Wet Prep HPF POC MODERATE (*)     WBC, Wet Prep HPF POC FEW (*)    All other components within normal limits  CBC - Abnormal; Notable for the following:    RBC 3.35 (*)    Hemoglobin 10.8 (*)    HCT 33.4 (*)    All other components within normal limits  URINALYSIS, ROUTINE W REFLEX MICROSCOPIC - Abnormal; Notable for the following:    Color, Urine AMBER (*)    APPearance TURBID (*)    Bilirubin Urine SMALL (*)    Protein, ur 100 (*)    Nitrite POSITIVE (*)    Leukocytes, UA TRACE (*)    All other components within normal limits  URINE MICROSCOPIC-ADD ON - Abnormal; Notable for the following:    Squamous Epithelial / LPF MANY (*)    Bacteria, UA MANY (*)    Casts GRANULAR CAST (*)    All other components within normal limits  GC/CHLAMYDIA PROBE AMP  BASIC METABOLIC PANEL  PREGNANCY, URINE  HIV ANTIBODY (ROUTINE TESTING)    Imaging Review No results found.   EKG Interpretation None      Patient  complaining of throbbing headache while in ED. Feels like previous HAs, normally takes Benadryl and Compazine at home for HA.   MDM   Final diagnoses:  UTI (lower urinary tract infection)  Bacterial vaginosis  Other migraine without status migrainosus, not intractable    Filed Vitals:   07/28/14 1228  BP: 109/64  Pulse: 94  Temp: 98.5 F (36.9 C)  Resp: 16   Afebrile, NAD, non-toxic appearing, AAOx4. I have reviewed nursing notes, vital signs, and all appropriate lab and imaging results for this patient. Patient is nontoxic, nonseptic appearing, in no apparent distress.  Patient's pain and other symptoms adequately managed in emergency department.  Fluids tolerated.  Labs and vitals reviewed.  Patient does not meet the SIRS or Sepsis criteria.  On repeat exam patient does not have a surgical abdomin and there are nor peritoneal signs.  No indication of appendicitis, bowel obstruction, bowel perforation, cholecystitis, diverticulitis, PID or ectopic pregnancy. BV noted on wet prep, will treat with Flagyl.  WBCs noted will prophylactically treat with Rocephin and Azithromycin. Pt has been diagnosed with a UTI. Pt is afebrile, no CVA tenderness, normotensive, and denies N/V. Patient developed headache while in emergency department, afebrile, consistent with previous headaches, improved after Compazine and Benadryl administration. Will discharge patient home with antibiotics. Return precautions discussed. Patient is agreeable to plan. Patient is stable time of discharge.  Jeannetta Ellis, PA-C 07/28/14 1859

## 2014-07-28 NOTE — ED Notes (Signed)
Patient states she started bleeding on and off x 2 weeks ago.  Patient complains of body aches and states "i think my Lupus is flaring".

## 2014-07-29 LAB — GC/CHLAMYDIA PROBE AMP
CT PROBE, AMP APTIMA: NEGATIVE
GC PROBE AMP APTIMA: NEGATIVE

## 2014-09-17 ENCOUNTER — Inpatient Hospital Stay (HOSPITAL_COMMUNITY)
Admission: AD | Admit: 2014-09-17 | Discharge: 2014-09-17 | Disposition: A | Discharge status: Home / Self Care | Payer: Medicaid - Out of State | Source: Ambulatory Visit | Attending: Obstetrics and Gynecology | Admitting: Obstetrics and Gynecology

## 2014-09-17 ENCOUNTER — Encounter (HOSPITAL_COMMUNITY): Payer: Self-pay

## 2014-09-17 DIAGNOSIS — N390 Urinary tract infection, site not specified: Secondary | ICD-10-CM

## 2014-09-17 DIAGNOSIS — N760 Acute vaginitis: Secondary | ICD-10-CM | POA: Diagnosis not present

## 2014-09-17 DIAGNOSIS — A499 Bacterial infection, unspecified: Secondary | ICD-10-CM | POA: Diagnosis not present

## 2014-09-17 DIAGNOSIS — B373 Candidiasis of vulva and vagina: Secondary | ICD-10-CM | POA: Diagnosis not present

## 2014-09-17 DIAGNOSIS — F17200 Nicotine dependence, unspecified, uncomplicated: Secondary | ICD-10-CM | POA: Insufficient documentation

## 2014-09-17 DIAGNOSIS — B3731 Acute candidiasis of vulva and vagina: Secondary | ICD-10-CM | POA: Diagnosis not present

## 2014-09-17 DIAGNOSIS — B9689 Other specified bacterial agents as the cause of diseases classified elsewhere: Secondary | ICD-10-CM | POA: Insufficient documentation

## 2014-09-17 DIAGNOSIS — N898 Other specified noninflammatory disorders of vagina: Secondary | ICD-10-CM | POA: Diagnosis present

## 2014-09-17 LAB — WET PREP, GENITAL: Trich, Wet Prep: NONE SEEN

## 2014-09-17 LAB — URINE MICROSCOPIC-ADD ON

## 2014-09-17 LAB — URINALYSIS, ROUTINE W REFLEX MICROSCOPIC
Bilirubin Urine: NEGATIVE
Glucose, UA: NEGATIVE mg/dL
Hgb urine dipstick: NEGATIVE
Ketones, ur: NEGATIVE mg/dL
Leukocytes, UA: NEGATIVE
Nitrite: POSITIVE — AB
Protein, ur: NEGATIVE mg/dL
SPECIFIC GRAVITY, URINE: 1.015 (ref 1.005–1.030)
Urobilinogen, UA: 0.2 mg/dL (ref 0.0–1.0)
pH: 7 (ref 5.0–8.0)

## 2014-09-17 LAB — POCT PREGNANCY, URINE: Preg Test, Ur: NEGATIVE

## 2014-09-17 MED ORDER — METRONIDAZOLE 500 MG PO TABS: 500.0000 mg | ORAL_TABLET | Freq: Two times a day (BID) | ORAL | Status: DC

## 2014-09-17 MED ORDER — FLUCONAZOLE 150 MG PO TABS: 150.0000 mg | ORAL_TABLET | Freq: Once | ORAL | Status: DC

## 2014-09-17 MED ORDER — NITROFURANTOIN MONOHYD MACRO 100 MG PO CAPS: 100.0000 mg | ORAL_CAPSULE | Freq: Two times a day (BID) | ORAL | Status: DC

## 2014-09-17 NOTE — MAU Provider Note (Signed)
History     CSN: [id number]  Arrival date and time: 09/17/14 1305   First Provider Initiated Contact with Patient 09/17/14 1555      Chief Complaint  Patient presents with  . Vaginal Discharge   Vaginal Discharge The patient's primary symptoms include a vaginal discharge.    Evelyn [name] is a 33 y.o. [phone] who presents today with urinary urgency and vaginal discharge x 1 week. She thinks that the vaginal discharge is BV as she has had it several times in the past, and her symptoms are the same. She also has lupus, and states that she lost all her prescriptions. She is requesting new prescriptions.   Past Medical History  Diagnosis Date  . Headache(784.0)   . Anxiety   . Anemia   . Depression   . Lupus   . Lupus nephritis   . Pericardial effusion     Intermittent. Associated with lupus.  . Arthritis     rheumatoid    Past Surgical History  Procedure Laterality Date  . Dilation and evacuation  11/10  . Laparoscopic tubal ligation  07/10/2011    Procedure: LAPAROSCOPIC TUBAL LIGATION;  Surgeon: Purcell Nails, MD;  Location: [facility];  Service: Gynecology;  Laterality: Bilateral;  fulguration  . Tubal ligation      Family History  Problem Relation Age of Onset  . Hypertension Mother     History  Substance Use Topics  . Smoking status: Current Every Day Smoker -- 0.25 packs/day for 7 years    Types: Cigarettes  . Smokeless tobacco: Never Used  . Alcohol Use: Yes     Comment: occasional     Allergies:  Allergies  Allergen Reactions  . Metoclopramide Hcl Anaphylaxis    Body started contracting, couldn't breathe, throat swelled up  . Ciprofloxacin Diarrhea and Nausea And Vomiting  . Nsaids     Flares up her lupus  . Pork-Derived Products Diarrhea and Nausea And Vomiting  . Sulfur     Flares up her lupus  . Tramadol     Drug interaction with kidney medicine    Prescriptions prior to admission  Medication Sig Dispense Refill  . acetaminophen  (TYLENOL) 500 MG tablet Take 2,000-3,000 mg by mouth 2 (two) times daily as needed (lupus pain).      Marland Kitchen azaTHIOprine (IMURAN) 50 MG tablet Take 150 mg by mouth daily.      . diphenhydrAMINE (BENADRYL) 25 MG tablet Take 25 mg by mouth every 6 (six) hours as needed for allergies.      . hydroxychloroquine (PLAQUENIL) 200 MG tablet Take 1 tablet (200 mg total) by mouth 2 (two) times daily. For lupus.      . ondansetron (ZOFRAN) 4 MG tablet Take 4 mg by mouth every 8 (eight) hours as needed for nausea or vomiting.      . predniSONE (DELTASONE) 10 MG tablet Take 10 mg by mouth daily. For Lupus exacerbation.      . prochlorperazine (COMPAZINE) 10 MG tablet Take 10 mg by mouth 2 (two) times daily as needed for nausea or vomiting.      . promethazine (PHENERGAN) 25 MG tablet Take 1 tablet (25 mg total) by mouth every 6 (six) hours as needed for nausea.  20 tablet  0  . venlafaxine XR (EFFEXOR-XR) 37.5 MG 24 hr capsule Take 37.5 mg by mouth daily with breakfast.        Review of Systems  Genitourinary: Positive for vaginal discharge.   Physical Exam  Blood pressure 110/64, pulse 92, temperature 99.7 F (37.6 C), temperature source Oral, resp. rate 16, height 5\' 5"  (1.651 m), weight 54.795 kg (120 lb 12.8 oz), last menstrual period 08/05/2014, SpO2 100.00%.  Physical Exam  Nursing note and vitals reviewed. Constitutional: She is oriented to person, place, and time. She appears well-developed and well-nourished. No distress.  Cardiovascular: Normal rate.   Respiratory: Effort normal.  GI: Soft. There is no tenderness. There is no rebound.  Genitourinary:   External: no lesion Vagina: small amount of white discharge Cervix: pink, smooth, no CMT Uterus: NSSC Adnexa: NT   Neurological: She is alert and oriented to person, place, and time.  Skin: Skin is warm and dry.  Psychiatric: She has a normal mood and affect.    MAU Course  Procedures Results for orders placed during the hospital  encounter of 09/17/14 (from the past 24 hour(s))  URINALYSIS, ROUTINE W REFLEX MICROSCOPIC     Status: Abnormal   Collection Time    09/17/14  2:49 PM      Result Value Ref Range   Color, Urine YELLOW  YELLOW   APPearance HAZY (*) CLEAR   Specific Gravity, Urine 1.015  1.005 - 1.030   pH 7.0  5.0 - 8.0   Glucose, UA NEGATIVE  NEGATIVE mg/dL   Hgb urine dipstick NEGATIVE  NEGATIVE   Bilirubin Urine NEGATIVE  NEGATIVE   Ketones, ur NEGATIVE  NEGATIVE mg/dL   Protein, ur NEGATIVE  NEGATIVE mg/dL   Urobilinogen, UA 0.2  0.0 - 1.0 mg/dL   Nitrite POSITIVE (*) NEGATIVE   Leukocytes, UA NEGATIVE  NEGATIVE  URINE MICROSCOPIC-ADD ON     Status: Abnormal   Collection Time    09/17/14  2:49 PM      Result Value Ref Range   Squamous Epithelial / LPF MANY (*) RARE   WBC, UA 3-6  <3 WBC/hpf   Bacteria, UA MANY (*) RARE   Urine-Other AMORPHOUS URATES/PHOSPHATES    POCT PREGNANCY, URINE     Status: None   Collection Time    09/17/14  2:55 PM      Result Value Ref Range   Preg Test, Ur NEGATIVE  NEGATIVE  WET PREP, GENITAL     Status: Abnormal   Collection Time    09/17/14  4:01 PM      Result Value Ref Range   Yeast Wet Prep HPF POC FEW (*) NONE SEEN   Trich, Wet Prep NONE SEEN  NONE SEEN   Clue Cells Wet Prep HPF POC MANY (*) NONE SEEN   WBC, Wet Prep HPF POC FEW (*) NONE SEEN    Assessment and Plan   1. UTI (lower urinary tract infection)   2. BV (bacterial vaginosis)   3. Yeast infection involving the vagina and surrounding area      Medication List         acetaminophen 500 MG tablet  Commonly known as:  TYLENOL  Take 2,000-3,000 mg by mouth 2 (two) times daily as needed (lupus pain).     azaTHIOprine 50 MG tablet  Commonly known as:  IMURAN  Take 150 mg by mouth daily.     diphenhydrAMINE 25 MG tablet  Commonly known as:  BENADRYL  Take 25 mg by mouth every 6 (six) hours as needed for allergies.     fluconazole 150 MG tablet  Commonly known as:  DIFLUCAN  Take 1  tablet (150 mg total) by mouth once. May repeat in 2 days  if still having symptoms.     hydroxychloroquine 200 MG tablet  Commonly known as:  PLAQUENIL  Take 1 tablet (200 mg total) by mouth 2 (two) times daily. For lupus.     metroNIDAZOLE 500 MG tablet  Commonly known as:  FLAGYL  Take 1 tablet (500 mg total) by mouth 2 (two) times daily.     nitrofurantoin (macrocrystal-monohydrate) 100 MG capsule  Commonly known as:  MACROBID  Take 1 capsule (100 mg total) by mouth 2 (two) times daily.     ondansetron 4 MG tablet  Commonly known as:  ZOFRAN  Take 4 mg by mouth every 8 (eight) hours as needed for nausea or vomiting.     predniSONE 10 MG tablet  Commonly known as:  DELTASONE  Take 10 mg by mouth daily. For Lupus exacerbation.     prochlorperazine 10 MG tablet  Commonly known as:  COMPAZINE  Take 10 mg by mouth 2 (two) times daily as needed for nausea or vomiting.     promethazine 25 MG tablet  Commonly known as:  PHENERGAN  Take 1 tablet (25 mg total) by mouth every 6 (six) hours as needed for nausea.     venlafaxine XR 37.5 MG 24 hr capsule  Commonly known as:  EFFEXOR-XR  Take 37.5 mg by mouth daily with breakfast.       Follow-up Information   Follow up with [facility] [place]. (As needed)    Contact information:   [address] [place] [address] [phone]       Tawnya Crook 09/17/2014, 4:04 PM

## 2014-09-17 NOTE — MAU Note (Signed)
Patient states she has had a tan vaginal discharge with itching for about one week. Has urinary urgency and the urine has an odor. Denies bleeding.

## 2014-09-17 NOTE — Discharge Instructions (Signed)
Candida Infection A Candida infection (also called yeast, fungus, and Monilia infection) is an overgrowth of yeast that can occur anywhere on the body. A yeast infection commonly occurs in warm, moist body areas. Usually, the infection remains localized but can spread to become a systemic infection. A yeast infection may be a sign of a more severe disease such as diabetes, leukemia, or AIDS. A yeast infection can occur in both men and women. In women, Candida vaginitis is a vaginal infection. It is one of the most common causes of vaginitis. Men usually do not have symptoms or know they have an infection until other problems develop. Men may find out they have a yeast infection because their sex partner has a yeast infection. Uncircumcised men are more likely to get a yeast infection than circumcised men. This is because the uncircumcised glans is not exposed to air and does not remain as dry as that of a circumcised glans. Older adults may develop yeast infections around dentures. CAUSES  Women  Antibiotics.  Steroid medication taken for a long time.  Being overweight (obese).  Diabetes.  Poor immune condition.  Certain serious medical conditions.  Immune suppressive medications for organ transplant patients.  Chemotherapy.  Pregnancy.  Menstruation.  Stress and fatigue.  Intravenous drug use.  Oral contraceptives.  Wearing tight-fitting clothes in the crotch area.  Catching it from a sex partner who has a yeast infection.  Spermicide.  Intravenous, urinary, or other catheters. Men  Catching it from a sex partner who has a yeast infection.  Having oral or anal sex with a person who has the infection.  Spermicide.  Diabetes.  Antibiotics.  Poor immune system.  Medications that suppress the immune system.  Intravenous drug use.  Intravenous, urinary, or other catheters. SYMPTOMS  Women  Thick, white vaginal discharge.  Vaginal itching.  Redness and  swelling in and around the vagina.  Irritation of the lips of the vagina and perineum.  Blisters on the vaginal lips and perineum.  Painful sexual intercourse.  Low blood sugar (hypoglycemia).  Painful urination.  Bladder infections.  Intestinal problems such as constipation, indigestion, bad breath, bloating, increase in gas, diarrhea, or loose stools. Men  Men may develop intestinal problems such as constipation, indigestion, bad breath, bloating, increase in gas, diarrhea, or loose stools.  Dry, cracked skin on the penis with itching or discomfort.  Jock itch.  Dry, flaky skin.  Athlete's foot.  Hypoglycemia. DIAGNOSIS  Women  A history and an exam are performed.  The discharge may be examined under a microscope.  A culture may be taken of the discharge. Men  A history and an exam are performed.  Any discharge from the penis or areas of cracked skin will be looked at under the microscope and cultured.  Stool samples may be cultured. TREATMENT  Women  Vaginal antifungal suppositories and creams.  Medicated creams to decrease irritation and itching on the outside of the vagina.  Warm compresses to the perineal area to decrease swelling and discomfort.  Oral antifungal medications.  Medicated vaginal suppositories or cream for repeated or recurrent infections.  Wash and dry the irritation areas before applying the cream.  Eating yogurt with Lactobacillus may help with prevention and treatment.  Sometimes painting the vagina with gentian violet solution may help if creams and suppositories do not work. Men  Antifungal creams and oral antifungal medications.  Sometimes treatment must continue for 30 days after the symptoms go away to prevent recurrence. HOME CARE INSTRUCTIONS  Women  Use cotton underwear and avoid tight-fitting clothing.  Avoid colored, scented toilet paper and deodorant tampons or pads.  Do not douche.  Keep your diabetes  under control.  Finish all the prescribed medications.  Keep your skin clean and dry.  Consume milk or yogurt with Lactobacillus-active culture regularly. If you get frequent yeast infections and think that is what the infection is, there are over-the-counter medications that you can get. If the infection does not show healing in 3 days, talk to your caregiver.  Tell your sex partner you have a yeast infection. Your partner may need treatment also, especially if your infection does not clear up or recurs. Men  Keep your skin clean and dry.  Keep your diabetes under control.  Finish all prescribed medications.  Tell your sex partner that you have a yeast infection so he or she can be treated if necessary. SEEK MEDICAL CARE IF:   Your symptoms do not clear up or worsen in one week after treatment.  You have an oral temperature above 102 F (38.9 C).  You have trouble swallowing or eating for a prolonged time.  You develop blisters on and around your vagina.  You develop vaginal bleeding and it is not your menstrual period.  You develop abdominal pain.  You develop intestinal problems as mentioned above.  You get weak or light-headed.  You have painful or increased urination.  You have pain during sexual intercourse. MAKE SURE YOU:   Understand these instructions.  Will watch your condition.  Will get help right away if you are not doing well or get worse. Document Released: 01/24/2005 Document Revised: 05/03/2014 Document Reviewed: 05/08/2010 [facility] Patient Information 2015 [place], [place]. This information is not intended to replace advice given to you by your health care provider. Make sure you discuss any questions you have with your health care provider. Bacterial Vaginosis Bacterial vaginosis is a vaginal infection that occurs when the normal balance of bacteria in the vagina is disrupted. It results from an overgrowth of certain bacteria. This is the most  common vaginal infection in women of childbearing age. Treatment is important to prevent complications, especially in pregnant women, as it can cause a premature delivery. CAUSES  Bacterial vaginosis is caused by an increase in harmful bacteria that are normally present in smaller amounts in the vagina. Several different kinds of bacteria can cause bacterial vaginosis. However, the reason that the condition develops is not fully understood. RISK FACTORS Certain activities or behaviors can put you at an increased risk of developing bacterial vaginosis, including:  Having a new sex partner or multiple sex partners.  Douching.  Using an intrauterine device (IUD) for contraception. Women do not get bacterial vaginosis from toilet seats, bedding, swimming pools, or contact with objects around them. SIGNS AND SYMPTOMS  Some women with bacterial vaginosis have no signs or symptoms. Common symptoms include:  Grey vaginal discharge.  A fishlike odor with discharge, especially after sexual intercourse.  Itching or burning of the vagina and vulva.  Burning or pain with urination. DIAGNOSIS  Your health care provider will take a medical history and examine the vagina for signs of bacterial vaginosis. A sample of vaginal fluid may be taken. Your health care provider will look at this sample under a microscope to check for bacteria and abnormal cells. A vaginal pH test may also be done.  TREATMENT  Bacterial vaginosis may be treated with antibiotic medicines. These may be given in the form of a pill  or a vaginal cream. A second round of antibiotics may be prescribed if the condition comes back after treatment.  HOME CARE INSTRUCTIONS   Only take over-the-counter or prescription medicines as directed by your health care provider.  If antibiotic medicine was prescribed, take it as directed. Make sure you finish it even if you start to feel better.  Do not have sex until treatment is  completed.  Tell all sexual partners that you have a vaginal infection. They should see their health care provider and be treated if they have problems, such as a mild rash or itching.  Practice safe sex by using condoms and only having one sex partner. SEEK MEDICAL CARE IF:   Your symptoms are not improving after 3 days of treatment.  You have increased discharge or pain.  You have a fever. MAKE SURE YOU:   Understand these instructions.  Will watch your condition.  Will get help right away if you are not doing well or get worse. FOR MORE INFORMATION  Centers for Disease Control and Prevention, Division of STD Prevention: SolutionApps.co.za American Sexual Health Association (ASHA): www.ashastd.org  Document Released: 12/17/2005 Document Revised: 10/07/2013 Document Reviewed: 07/29/2013 [facility] Patient Information 2015 [place], [place]. This information is not intended to replace advice given to you by your health care provider. Make sure you discuss any questions you have with your health care provider.  Urinary Tract Infection Urinary tract infections (UTIs) can develop anywhere along your urinary tract. Your urinary tract is your body's drainage system for removing wastes and extra water. Your urinary tract includes two kidneys, two ureters, a bladder, and a urethra. Your kidneys are a pair of bean-shaped organs. Each kidney is about the size of your fist. They are located below your ribs, one on each side of your spine. CAUSES Infections are caused by microbes, which are microscopic organisms, including fungi, viruses, and bacteria. These organisms are so small that they can only be seen through a microscope. Bacteria are the microbes that most commonly cause UTIs. SYMPTOMS  Symptoms of UTIs may vary by age and gender of the patient and by the location of the infection. Symptoms in young women typically include a frequent and intense urge to urinate and a painful, burning feeling in  the bladder or urethra during urination. Older women and men are more likely to be tired, shaky, and weak and have muscle aches and abdominal pain. A fever may mean the infection is in your kidneys. Other symptoms of a kidney infection include pain in your back or sides below the ribs, nausea, and vomiting. DIAGNOSIS To diagnose a UTI, your caregiver will ask you about your symptoms. Your caregiver also will ask to provide a urine sample. The urine sample will be tested for bacteria and white blood cells. White blood cells are made by your body to help fight infection. TREATMENT  Typically, UTIs can be treated with medication. Because most UTIs are caused by a bacterial infection, they usually can be treated with the use of antibiotics. The choice of antibiotic and length of treatment depend on your symptoms and the type of bacteria causing your infection. HOME CARE INSTRUCTIONS  If you were prescribed antibiotics, take them exactly as your caregiver instructs you. Finish the medication even if you feel better after you have only taken some of the medication.  Drink enough water and fluids to keep your urine clear or pale yellow.  Avoid caffeine, tea, and carbonated beverages. They tend to irritate your bladder.  Empty your bladder often. Avoid holding urine for long periods of time.  Empty your bladder before and after sexual intercourse.  After a bowel movement, women should cleanse from front to back. Use each tissue only once. SEEK MEDICAL CARE IF:   You have back pain.  You develop a fever.  Your symptoms do not begin to resolve within 3 days. SEEK IMMEDIATE MEDICAL CARE IF:   You have severe back pain or lower abdominal pain.  You develop chills.  You have nausea or vomiting.  You have continued burning or discomfort with urination. MAKE SURE YOU:   Understand these instructions.  Will watch your condition.  Will get help right away if you are not doing well or get  worse. Document Released: 09/26/2005 Document Revised: 06/17/2012 Document Reviewed: 01/25/2012 [facility] Patient Information 2015 [place], [place]. This information is not intended to replace advice given to you by your health care provider. Make sure you discuss any questions you have with your health care provider.

## 2014-09-17 NOTE — MAU Note (Signed)
Pt also states she has been out of her lupus medications and has continued to call her rheumatologist in [place], [place].

## 2014-09-17 NOTE — MAU Provider Note (Signed)
Attestation of Attending Supervision of Advanced Practitioner (CNM/NP): Evaluation and management procedures were performed by the Advanced Practitioner under my supervision and collaboration.  I have reviewed the Advanced Practitioner's note and chart, and I agree with the management and plan.  Afnan Emberton 09/17/2014 5:44 PM

## 2014-09-18 LAB — GC/CHLAMYDIA PROBE AMP
CT Probe RNA: NEGATIVE
GC Probe RNA: NEGATIVE

## 2014-09-18 LAB — HIV ANTIBODY (ROUTINE TESTING W REFLEX): HIV 1&2 Ab, 4th Generation: NONREACTIVE

## 2014-09-28 ENCOUNTER — Emergency Department (HOSPITAL_COMMUNITY)
Admission: EM | Admit: 2014-09-28 | Discharge: 2014-09-28 | Disposition: A | Discharge status: Home / Self Care | Payer: Medicaid - Out of State | Attending: Emergency Medicine | Admitting: Emergency Medicine

## 2014-09-28 ENCOUNTER — Encounter (HOSPITAL_COMMUNITY): Payer: Self-pay | Admitting: Emergency Medicine

## 2014-09-28 DIAGNOSIS — R197 Diarrhea, unspecified: Secondary | ICD-10-CM | POA: Diagnosis not present

## 2014-09-28 DIAGNOSIS — Z79899 Other long term (current) drug therapy: Secondary | ICD-10-CM | POA: Diagnosis not present

## 2014-09-28 DIAGNOSIS — F3289 Other specified depressive episodes: Secondary | ICD-10-CM | POA: Diagnosis not present

## 2014-09-28 DIAGNOSIS — M129 Arthropathy, unspecified: Secondary | ICD-10-CM | POA: Diagnosis not present

## 2014-09-28 DIAGNOSIS — IMO0002 Reserved for concepts with insufficient information to code with codable children: Secondary | ICD-10-CM | POA: Diagnosis not present

## 2014-09-28 DIAGNOSIS — Z862 Personal history of diseases of the blood and blood-forming organs and certain disorders involving the immune mechanism: Secondary | ICD-10-CM | POA: Diagnosis not present

## 2014-09-28 DIAGNOSIS — R51 Headache: Secondary | ICD-10-CM | POA: Insufficient documentation

## 2014-09-28 DIAGNOSIS — IMO0001 Reserved for inherently not codable concepts without codable children: Secondary | ICD-10-CM | POA: Insufficient documentation

## 2014-09-28 DIAGNOSIS — Z8679 Personal history of other diseases of the circulatory system: Secondary | ICD-10-CM | POA: Insufficient documentation

## 2014-09-28 DIAGNOSIS — F411 Generalized anxiety disorder: Secondary | ICD-10-CM | POA: Insufficient documentation

## 2014-09-28 DIAGNOSIS — F17200 Nicotine dependence, unspecified, uncomplicated: Secondary | ICD-10-CM | POA: Diagnosis not present

## 2014-09-28 DIAGNOSIS — R5383 Other fatigue: Secondary | ICD-10-CM

## 2014-09-28 DIAGNOSIS — F329 Major depressive disorder, single episode, unspecified: Secondary | ICD-10-CM | POA: Diagnosis not present

## 2014-09-28 DIAGNOSIS — R509 Fever, unspecified: Secondary | ICD-10-CM | POA: Diagnosis not present

## 2014-09-28 DIAGNOSIS — F32A Depression, unspecified: Secondary | ICD-10-CM

## 2014-09-28 DIAGNOSIS — R112 Nausea with vomiting, unspecified: Secondary | ICD-10-CM | POA: Insufficient documentation

## 2014-09-28 DIAGNOSIS — R5381 Other malaise: Secondary | ICD-10-CM

## 2014-09-28 DIAGNOSIS — Z3202 Encounter for pregnancy test, result negative: Secondary | ICD-10-CM | POA: Insufficient documentation

## 2014-09-28 DIAGNOSIS — M329 Systemic lupus erythematosus, unspecified: Secondary | ICD-10-CM | POA: Insufficient documentation

## 2014-09-28 LAB — CBC WITH DIFFERENTIAL/PLATELET
Basophils Absolute: 0 10*3/uL (ref 0.0–0.1)
Basophils Relative: 0 % (ref 0–1)
EOS PCT: 0 % (ref 0–5)
Eosinophils Absolute: 0 10*3/uL (ref 0.0–0.7)
HEMATOCRIT: 34.8 % — AB (ref 36.0–46.0)
Hemoglobin: 11.2 g/dL — ABNORMAL LOW (ref 12.0–15.0)
LYMPHS ABS: 0.3 10*3/uL — AB (ref 0.7–4.0)
LYMPHS PCT: 12 % (ref 12–46)
MCH: 31.8 pg (ref 26.0–34.0)
MCHC: 32.2 g/dL (ref 30.0–36.0)
MCV: 98.9 fL (ref 78.0–100.0)
MONO ABS: 0.3 10*3/uL (ref 0.1–1.0)
Monocytes Relative: 12 % (ref 3–12)
Neutro Abs: 2.2 10*3/uL (ref 1.7–7.7)
Neutrophils Relative %: 76 % (ref 43–77)
Platelets: 236 10*3/uL (ref 150–400)
RBC: 3.52 MIL/uL — AB (ref 3.87–5.11)
RDW: 13.5 % (ref 11.5–15.5)
WBC: 2.9 10*3/uL — AB (ref 4.0–10.5)

## 2014-09-28 LAB — COMPREHENSIVE METABOLIC PANEL
ALT: 11 U/L (ref 0–35)
AST: 23 U/L (ref 0–37)
Albumin: 4 g/dL (ref 3.5–5.2)
Alkaline Phosphatase: 64 U/L (ref 39–117)
Anion gap: 12 (ref 5–15)
BUN: 6 mg/dL (ref 6–23)
CO2: 24 meq/L (ref 19–32)
CREATININE: 0.72 mg/dL (ref 0.50–1.10)
Calcium: 9.3 mg/dL (ref 8.4–10.5)
Chloride: 102 mEq/L (ref 96–112)
GLUCOSE: 97 mg/dL (ref 70–99)
Potassium: 4.2 mEq/L (ref 3.7–5.3)
Sodium: 138 mEq/L (ref 137–147)
Total Bilirubin: 0.3 mg/dL (ref 0.3–1.2)
Total Protein: 8.2 g/dL (ref 6.0–8.3)

## 2014-09-28 LAB — POC URINE PREG, ED: Preg Test, Ur: NEGATIVE

## 2014-09-28 LAB — URINALYSIS, ROUTINE W REFLEX MICROSCOPIC
GLUCOSE, UA: NEGATIVE mg/dL
Hgb urine dipstick: NEGATIVE
KETONES UR: NEGATIVE mg/dL
NITRITE: NEGATIVE
PROTEIN: 30 mg/dL — AB
Specific Gravity, Urine: 1.024 (ref 1.005–1.030)
Urobilinogen, UA: 1 mg/dL (ref 0.0–1.0)
pH: 8 (ref 5.0–8.0)

## 2014-09-28 LAB — URINE MICROSCOPIC-ADD ON

## 2014-09-28 LAB — LIPASE, BLOOD: LIPASE: 26 U/L (ref 11–59)

## 2014-09-28 MED ORDER — HYDROMORPHONE HCL 1 MG/ML IJ SOLN
1.0000 mg | Freq: Once | INTRAMUSCULAR | Status: DC
  Filled 2014-09-28: qty 1

## 2014-09-28 MED ORDER — OXYCODONE HCL 5 MG PO TABS: 5.0000 mg | ORAL_TABLET | Freq: Four times a day (QID) | ORAL | Status: DC | PRN

## 2014-09-28 MED ORDER — HYDROXYCHLOROQUINE SULFATE 200 MG PO TABS: 300.0000 mg | ORAL_TABLET | Freq: Every day | ORAL | Status: DC

## 2014-09-28 MED ORDER — PREDNISONE 10 MG PO TABS: 10.0000 mg | ORAL_TABLET | Freq: Every day | ORAL | Status: DC

## 2014-09-28 MED ORDER — HYDROMORPHONE HCL 1 MG/ML IJ SOLN
1.0000 mg | Freq: Once | INTRAMUSCULAR | Status: AC
  Administered 2014-09-28: 1 mg via INTRAMUSCULAR

## 2014-09-28 MED ORDER — VENLAFAXINE HCL ER 75 MG PO CP24: 75.0000 mg | ORAL_CAPSULE | Freq: Every day | ORAL | Status: DC

## 2014-09-28 NOTE — Progress Notes (Signed)
  CARE MANAGEMENT ED NOTE 09/28/2014  Patient:  Evelyn [name], Evelyn [name]   Account Number:  [id number]  Date Initiated:  09/28/2014  Documentation initiated by:  Edd Arbour  Subjective/Objective Assessment:   33 yr old [place] who has 3 ED visits in the last 6 months PMHx of migraines, chronic pain, lupus, depression, and anxiety who presents to the ED w/ malaise x 1.5 weeks.     Subjective/Objective Assessment Detail:   pcp per pt is at [facility] at main hospital  at [phone] She can not tell CM the pcp name  States rheumatologist is at [phone] per her cell phone [address] [place], [place] [address] Dr Leamon Arnt    Pt reports being afraid of being home alone with her illnesses Report wt losses, Report f/u appt with Rheumatologists in 3 months and pcp Reports f/u appts coming soon.  Reports her mother had a friend of mothers come to stay with her as a PDN provider for a period of time but this person no longer  Reports having a 31 yr old whom she wants to remain in her home with vs living with others or going to ALF, Independent living or snf    Pt declines home health at this time and will follow up after she gets home  Discussed medical necessity needed to be hospitalized Discussed differences in ED services and hospital services  Reports of home health services before and will have pcp to assist if she changes her mind     Action/Plan:   ED CM consulted by ED [place] & Rn about CM consult Spoke with ED resident, D Danella Penton about cm Consult for resources to assist her with home care Spoke with pt Reviewed home health, PDN services Encourage  contact to pcp, rheumatologist, family   Action/Plan Detail:   and friends about staying with her,  CAPs (referred to pcp for CAPs list referral) Encourage medical alert system designed to protect her in a home emergency event EPIC updated   Anticipated DC Date:  09/28/2014     Status Recommendation  to Physician:   Result of Recommendation:    Other ED Services  Consult Working Plan    DC Planning Services  Other  Outpatient Services - Pt will follow up  Patient refused services    Choice offered to / List presented to:  C-1 Patient          Status of service:  Completed, signed off  ED Comments:   ED Comments Detail:  CM reviewed in details home health [facility]) (length of stay in home, types of [facility] staff available, coverage, primary caregiver, up to 24 hrs before services may be started), Private duty nursing (PDN-coverage, length of stay in the home types of staff available), assisted living (ASL- coverage, services offered) and Skilled nursing facilities (snf- coverage and services offered) Provided pt with a list of [place] [facility] home health agencies, PDN.

## 2014-09-28 NOTE — ED Notes (Signed)
Per CM hold discharge until CM speaks with pt.

## 2014-09-28 NOTE — ED Notes (Signed)
Pt states she no longer has a headache but continuous to have all over body ache.

## 2014-09-28 NOTE — ED Provider Notes (Signed)
CSN: [id number]     Arrival date & time 09/28/14  6789 History   First MD Initiated Contact with Patient 09/28/14 1012     Chief Complaint  Patient presents with  . Generalized Body Aches  . Emesis     (Consider location/radiation/quality/duration/timing/severity/associated sxs/prior Treatment) Patient is a 33 y.o. female presenting with vomiting.  Emesis Associated symptoms: chills, diarrhea (4 bowel movements yesterday), headaches and myalgias   Associated symptoms: no sore throat    Pt is a 33 y/o female w/ PMHx of migraines, chronic pain, lupus, depression, and anxiety who presents to the ED w/ malaise x 1.5 weeks. She has been out of her prednisone and effexor meds x 2 weeks for her lupus and depression respectively. She goes to [facility] for her primary care but did not want to wait in the hospital there thus came to the [facility] ED. She states that fell yesterday while walking up the stairs and was on the 6 or 7th stair when she got dizzy and fell backwards. She reports that she blacked out but was able to get up after an hour and walk to the couch to lay down. She has been laying on her couch and using a bucket by the couch for the past 3 days to urinate b/c she felt weak. She is complaining of diffuse body aches that she rates as 10/10 in severity, HA's, nausea, dry heaving, fevers (measure temp of 103.0 deg F at home), and anorexia. Denies CP, SOB, recent illness, and sore throat. She has experience these symptoms before and was dx w/ a lupus flare. Denies SI and HI.   Past Medical History  Diagnosis Date  . Headache(784.0)   . Anxiety   . Anemia   . Depression   . Lupus   . Lupus nephritis   . Pericardial effusion     Intermittent. Associated with lupus.  . Arthritis     rheumatoid   Past Surgical History  Procedure Laterality Date  . Dilation and evacuation  11/10  . Laparoscopic tubal ligation  07/10/2011    Procedure: LAPAROSCOPIC TUBAL LIGATION;  Surgeon: Purcell Nails, MD;   Location: [facility];  Service: Gynecology;  Laterality: Bilateral;  fulguration  . Tubal ligation     Family History  Problem Relation Age of Onset  . Hypertension Mother    History  Substance Use Topics  . Smoking status: Current Every Day Smoker -- 0.25 packs/day for 7 years    Types: Cigarettes  . Smokeless tobacco: Never Used  . Alcohol Use: Yes     Comment: occasional    OB History   Grav Para Term Preterm Abortions TAB SAB Ect Mult Living   3 1 1  2  2   1      Review of Systems  Constitutional: Positive for fever, chills and appetite change (decreased).  HENT: Negative for sore throat.   Respiratory: Negative for shortness of breath.   Cardiovascular: Negative for chest pain.  Gastrointestinal: Positive for nausea and diarrhea (4 bowel movements yesterday). Negative for vomiting.  Genitourinary: Negative for dysuria.  Musculoskeletal: Positive for myalgias.  Allergic/Immunologic: Negative for food allergies.  Neurological: Positive for weakness and headaches.  Psychiatric/Behavioral: Negative for suicidal ideas.      Allergies  Metoclopramide hcl; Ciprofloxacin; Nsaids; Pork-derived products; Sulfur; and Tramadol  Home Medications   Prior to Admission medications   Medication Sig Start Date End Date Taking? Authorizing Provider  acetaminophen (TYLENOL) 500 MG tablet Take 2,000-3,000 mg by  mouth 2 (two) times daily as needed (lupus pain).   Yes Historical Provider, MD  azaTHIOprine (IMURAN) 50 MG tablet Take 150 mg by mouth daily.   Yes Historical Provider, MD  diphenhydrAMINE (BENADRYL) 25 MG tablet Take 25 mg by mouth every 6 (six) hours as needed for allergies (allergies).    Yes Historical Provider, MD  fluconazole (DIFLUCAN) 150 MG tablet Take 1 tablet (150 mg total) by mouth once. May repeat in 2 days if still having symptoms. 09/17/14  Yes Heather Alger Memos, CNM  hydroxychloroquine (PLAQUENIL) 200 MG tablet Take 1 tablet (200 mg total) by mouth 2 (two) times  daily. For lupus. 09/17/13  Yes Verne Spurr, PA-C  metroNIDAZOLE (FLAGYL) 500 MG tablet Take 1 tablet (500 mg total) by mouth 2 (two) times daily. 09/17/14   Heather Alger Memos, CNM  nitrofurantoin, macrocrystal-monohydrate, (MACROBID) 100 MG capsule Take 1 capsule (100 mg total) by mouth 2 (two) times daily. 09/17/14   Heather Alger Memos, CNM  ondansetron (ZOFRAN) 4 MG tablet Take 4 mg by mouth every 8 (eight) hours as needed for nausea or vomiting.    Historical Provider, MD  predniSONE (DELTASONE) 10 MG tablet Take 10 mg by mouth daily. For Lupus exacerbation. 09/17/13   Verne Spurr, PA-C  prochlorperazine (COMPAZINE) 10 MG tablet Take 10 mg by mouth 2 (two) times daily as needed for nausea or vomiting.    Historical Provider, MD  promethazine (PHENERGAN) 25 MG tablet Take 1 tablet (25 mg total) by mouth every 6 (six) hours as needed for nausea. 04/26/14   Juliet Rude. Rubin Payor, MD  venlafaxine XR (EFFEXOR-XR) 37.5 MG 24 hr capsule Take 37.5 mg by mouth daily with breakfast.    Historical Provider, MD   BP 107/66  Pulse 91  Temp(Src) 99.6 F (37.6 C) (Oral)  Resp 18  SpO2 100%  LMP 08/05/2014 Physical Exam  Constitutional: She is oriented to person, place, and time.  Thin, using bed sheets to cover face  HENT:  Mouth/Throat: Oropharynx is clear and moist.  1 inch in diameter lump in the middle of forehead w/o any discoloration or open wound   Eyes: Pupils are equal, round, and reactive to light.  Neck:  No tenderness or step offs on palpation of cervical spine   Cardiovascular: Normal rate and regular rhythm.   Pulmonary/Chest: Effort normal. She has no wheezes.  Abdominal: Soft. Bowel sounds are normal. There is no tenderness. There is no rebound and no guarding.  Musculoskeletal: She exhibits no edema.  Lymphadenopathy:    She has no cervical adenopathy.  Neurological: She is alert and oriented to person, place, and time.  Psychiatric:  Tearful during exam    ED Course   Procedures (including critical care time) Labs Review Labs Reviewed  CBC WITH DIFFERENTIAL - Abnormal; Notable for the following:    WBC 2.9 (*)    RBC 3.52 (*)    Hemoglobin 11.2 (*)    HCT 34.8 (*)    Lymphs Abs 0.3 (*)    All other components within normal limits  URINALYSIS, ROUTINE W REFLEX MICROSCOPIC - Abnormal; Notable for the following:    Color, Urine RED (*)    APPearance CLOUDY (*)    Bilirubin Urine SMALL (*)    Protein, ur 30 (*)    Leukocytes, UA SMALL (*)    All other components within normal limits  URINE MICROSCOPIC-ADD ON - Abnormal; Notable for the following:    Bacteria, UA FEW (*)    All  other components within normal limits  COMPREHENSIVE METABOLIC PANEL  LIPASE, BLOOD  POC URINE PREG, ED      MDM   Final diagnoses:  Lupus  Depressive disorder  Malaise and fatigue   Pt presents to the ED with generalized malaise for 1.5 weeks and has been off all her meds x 2 weeks. Likely pt's presentation due to non compliance w/ meds.   Will give enough medications to last until her appt with them in 3 weeks which per last rheumatology note on 06/07/14 from [facility]  are: HCQ 300mg  daily, prednisone 10mg  daily, and effexor 75 mg. Given dilaudid injection for pain management.   Case management saw patient and provided her with a list of resources in the community. Pt stable for d/c home.    , MD 09/28/14 1310

## 2014-09-28 NOTE — Discharge Instructions (Signed)
I refilled your lupus medications and effexor. I gave you a 3 week supply. It is very important that you keep your appointment with your PCP in three weeks.

## 2014-09-28 NOTE — ED Notes (Signed)
Delay in discharge related to addition/change to discharge paperwork.

## 2014-09-28 NOTE — ED Notes (Addendum)
Pt reports fall on steps on way to bathroom yesterday resulting in knot to mid frontal forehead and mid frontal calf to left leg. Upon assessment not opening or visible swelling noted. With touch slight swelling/knot felt to areas reports. Pt reports from [facility] and PCP at [facility]. Pt reports came her for treatment because "Octavio Manns almost killed me twice; unable to treat me because they do not have rheumatologist." Pt reports hx of lupus and out of all medications for two weeks. Pt reports bilateral leg and back ache. Pt also reports fever, n/v, and diarrhea starting yesterday and last taken tylenol at 0100 this am. Pt reports 3 episodes of diarrhea today.

## 2014-09-28 NOTE — ED Notes (Signed)
Patient states she fell yesterday because she fell off of 6 steps yesterday and hit her head. Patient c/o body aches, vomiting since yesterday. patiaent has multiple complaints. Crying in triage.

## 2014-09-29 NOTE — ED Provider Notes (Signed)
This patient was seen in conjunction with the resident physician. The documentation accurately reflects the patient's evaluation here in the emergency department. On my exam the patient was awake, alert, in no distress.  Patient described no recent medication compliance, and had positive review of systems on nearly all questions. It was unclear, which of the patient's primary complaints was not concerning to her, but the patient's evaluation here was largely reassuring, with no evidence for systemic infection, imminent decompensation. Patient was afebrile, hemodynamically stable. Given the patient's seeming difficulty with 10 medication, we discussed her case with case management to assist with provision of resources for the patient.   Gerhard Munch, MD 09/29/14 [phone]

## 2014-11-01 ENCOUNTER — Encounter (HOSPITAL_COMMUNITY): Payer: Self-pay | Admitting: Emergency Medicine

## 2014-12-03 ENCOUNTER — Emergency Department (HOSPITAL_COMMUNITY)
Admission: EM | Admit: 2014-12-03 | Discharge: 2014-12-03 | Disposition: A | Discharge status: Home / Self Care | Payer: Medicaid - Out of State | Attending: Emergency Medicine | Admitting: Emergency Medicine

## 2014-12-03 ENCOUNTER — Encounter (HOSPITAL_COMMUNITY): Payer: Self-pay | Admitting: Emergency Medicine

## 2014-12-03 DIAGNOSIS — Z79899 Other long term (current) drug therapy: Secondary | ICD-10-CM | POA: Insufficient documentation

## 2014-12-03 DIAGNOSIS — F329 Major depressive disorder, single episode, unspecified: Secondary | ICD-10-CM | POA: Insufficient documentation

## 2014-12-03 DIAGNOSIS — Z8679 Personal history of other diseases of the circulatory system: Secondary | ICD-10-CM | POA: Insufficient documentation

## 2014-12-03 DIAGNOSIS — Z720 Tobacco use: Secondary | ICD-10-CM | POA: Diagnosis not present

## 2014-12-03 DIAGNOSIS — M069 Rheumatoid arthritis, unspecified: Secondary | ICD-10-CM | POA: Insufficient documentation

## 2014-12-03 DIAGNOSIS — F419 Anxiety disorder, unspecified: Secondary | ICD-10-CM | POA: Diagnosis not present

## 2014-12-03 DIAGNOSIS — Z862 Personal history of diseases of the blood and blood-forming organs and certain disorders involving the immune mechanism: Secondary | ICD-10-CM | POA: Diagnosis not present

## 2014-12-03 DIAGNOSIS — Z7952 Long term (current) use of systemic steroids: Secondary | ICD-10-CM | POA: Insufficient documentation

## 2014-12-03 DIAGNOSIS — M25552 Pain in left hip: Secondary | ICD-10-CM | POA: Insufficient documentation

## 2014-12-03 DIAGNOSIS — G8929 Other chronic pain: Secondary | ICD-10-CM | POA: Diagnosis not present

## 2014-12-03 DIAGNOSIS — M25562 Pain in left knee: Secondary | ICD-10-CM | POA: Insufficient documentation

## 2014-12-03 DIAGNOSIS — IMO0001 Reserved for inherently not codable concepts without codable children: Secondary | ICD-10-CM

## 2014-12-03 MED ORDER — OXYCODONE HCL 5 MG PO TABS: 5.0000 mg | ORAL_TABLET | Freq: Four times a day (QID) | ORAL | Status: DC | PRN

## 2014-12-03 MED ORDER — OXYCODONE-ACETAMINOPHEN 5-325 MG PO TABS
2.0000 | ORAL_TABLET | Freq: Once | ORAL | Status: AC
  Administered 2014-12-03: 2 via ORAL

## 2014-12-03 NOTE — Discharge Instructions (Signed)

## 2014-12-03 NOTE — Progress Notes (Signed)
ED CM spoke with [facility] who state it will be Wednesday 12/08/14 before pt can be seen because she is a medicaid pt "It has to get approved first. It will be the same with any home health agency"   CM [facility]s [facility] and [facility]  Pt updated

## 2014-12-03 NOTE — Progress Notes (Signed)
[facility] ED CM consulted by ED RN Lauren about assistance for walker and home health for pt. Pt reports living alone in 2 story home with her aunt 6 houses down who could be her care giver Pt with chronic medical issues, seen by rheumatologist Dr Malvin Johns in [place] [phone]   States she is seen by 3 providers in the office primarily Reports her pcp only sends her to her rheumatologist "He does not know what to do for me" referring to pcp. Reports having a pcp but can not recall the name of [facility] p[facility].  Thinks Dr is  "[facility]". Pt reports mother lives in another state.   CM reviewed in details guidelines for home health [facility]) (length of stay in home, types of [facility] staff available, coverage, primary caregiver, up to 24 hrs before services may be started) Pt does not have a choice of home health agency she would prefer to use at this time.  Will let Cm know her preference   Cm spoke with [facility] staff Mayra Reel and Baxter Hire to get a walker for pt prior to leaving the [facility] ED wt 120 lbs ht 5'51/2"

## 2014-12-03 NOTE — ED Provider Notes (Signed)
CSN: [id number]     Arrival date & time 12/03/14  1252 History   This chart was scribed for non-physician practitioner working with Tilden Fossa, MD, by Jarvis Morgan, ED Scribe. This patient was seen in room WTR5/WTR5 and the patient's care was started at 2:10 PM.    Chief Complaint  Patient presents with  . Leg Pain  . Hip Pain    The history is provided by the patient. No language interpreter was used.    HPI Comments: Evelyn [name] is a 33 y.o. female with a h/o anxiety, anemia, depression, lupus and rheumatoid arthritis who presents to the Emergency Department complaining that she is having severe leg and hip pain. She states that her bones in her legs and hips are deteriorating due to her lupus medications. She is followed by [facility] for her lupus. Last week she had x-rays and MRI done. She has a follow up appt with an orthopaedic surgeon on the 16th but states she is in too much pain to make it until then. She notes she is having trouble bearing any weight on her left leg she is currently using a wheelchair to get around the ED. She has not taken anything for the pain. She cannot take any aspirin medications. She denies any other issues at this time.    Past Medical History  Diagnosis Date  . Headache(784.0)   . Anxiety   . Anemia   . Depression   . Lupus   . Lupus nephritis   . Pericardial effusion     Intermittent. Associated with lupus.  . Arthritis     rheumatoid   Past Surgical History  Procedure Laterality Date  . Dilation and evacuation  11/10  . Laparoscopic tubal ligation  07/10/2011    Procedure: LAPAROSCOPIC TUBAL LIGATION;  Surgeon: Purcell Nails, MD;  Location: [facility];  Service: Gynecology;  Laterality: Bilateral;  fulguration  . Tubal ligation     Family History  Problem Relation Age of Onset  . Hypertension Mother    History  Substance Use Topics  . Smoking status: Current Every Day Smoker -- 0.25 packs/day for 7 years    Types: Cigarettes  .  Smokeless tobacco: Never Used  . Alcohol Use: Yes     Comment: occasional    OB History    Gravida Para Term Preterm AB TAB SAB Ectopic Multiple Living   3 1 1  2  2   1      Review of Systems  Musculoskeletal: Positive for arthralgias (R hip and R leg) and gait problem.  All other systems reviewed and are negative.     Allergies  Metoclopramide hcl; Ciprofloxacin; Nsaids; Pork-derived products; Sulfur; and Tramadol  Home Medications   Prior to Admission medications   Medication Sig Start Date End Date Taking? Authorizing Provider  acetaminophen (TYLENOL) 500 MG tablet Take 2,000-3,000 mg by mouth 2 (two) times daily as needed (lupus pain).    Historical Provider, MD  azaTHIOprine (IMURAN) 50 MG tablet Take 150 mg by mouth daily.    Historical Provider, MD  diphenhydrAMINE (BENADRYL) 25 MG tablet Take 25 mg by mouth every 6 (six) hours as needed for allergies (allergies).     Historical Provider, MD  fluconazole (DIFLUCAN) 150 MG tablet Take 1 tablet (150 mg total) by mouth once. May repeat in 2 days if still having symptoms. 09/17/14   Heather 09/19/14, CNM  hydroxychloroquine (PLAQUENIL) 200 MG tablet Take 1 tablet (200 mg total) by mouth 2 (  two) times daily. For lupus. 09/17/13   Verne Spurr, PA-C  hydroxychloroquine (PLAQUENIL) 200 MG tablet Take 1.5 tablets (300 mg total) by mouth daily. 09/28/14   Gara Kroner, MD  metroNIDAZOLE (FLAGYL) 500 MG tablet Take 1 tablet (500 mg total) by mouth 2 (two) times daily. 09/17/14   Heather Alger Memos, CNM  nitrofurantoin, macrocrystal-monohydrate, (MACROBID) 100 MG capsule Take 1 capsule (100 mg total) by mouth 2 (two) times daily. 09/17/14   Heather Alger Memos, CNM  ondansetron (ZOFRAN) 4 MG tablet Take 4 mg by mouth every 8 (eight) hours as needed for nausea or vomiting (nausea).     Historical Provider, MD  oxyCODONE (ROXICODONE) 5 MG immediate release tablet Take 1 tablet (5 mg total) by mouth every 6 (six) hours as needed for  severe pain. 09/28/14   Gara Kroner, MD  predniSONE (DELTASONE) 10 MG tablet Take 10 mg by mouth daily. For Lupus exacerbation. 09/17/13   Verne Spurr, PA-C  predniSONE (DELTASONE) 10 MG tablet Take 1 tablet (10 mg total) by mouth daily with breakfast. 09/28/14   Gara Kroner, MD  prochlorperazine (COMPAZINE) 10 MG tablet Take 10 mg by mouth 2 (two) times daily as needed for nausea or vomiting (headache).     Historical Provider, MD  promethazine (PHENERGAN) 25 MG tablet Take 25 mg by mouth every 6 (six) hours as needed for nausea or vomiting (nausea).    Historical Provider, MD  venlafaxine XR (EFFEXOR XR) 75 MG 24 hr capsule Take 1 capsule (75 mg total) by mouth daily with breakfast. 09/28/14   Gara Kroner, MD  venlafaxine XR (EFFEXOR-XR) 37.5 MG 24 hr capsule Take 37.5 mg by mouth daily with breakfast.    Historical Provider, MD   Triage Vitals: BP 92/64 mmHg  Pulse 99  Temp(Src) 98.1 F (36.7 C) (Oral)  Resp 16  SpO2 100%  LMP 09/03/2014  Physical Exam  Constitutional: She is oriented to person, place, and time. She appears well-developed and well-nourished. No distress.  HENT:  Head: Normocephalic and atraumatic.  Eyes: Conjunctivae and EOM are normal.  Neck: Neck supple. No tracheal deviation present.  Cardiovascular: Normal rate.   Pulmonary/Chest: Effort normal. No respiratory distress.  Musculoskeletal: Normal range of motion.       Right knee: She exhibits no deformity.       Left knee: She exhibits no deformity.  Neurological: She is alert and oriented to person, place, and time.  Skin: Skin is warm and dry.  Psychiatric: She has a normal mood and affect. Her behavior is normal.  Nursing note and vitals reviewed.   ED Course  Procedures (including critical care time)  DIAGNOSTIC STUDIES: Oxygen Saturation is 100% on RA, normal by my interpretation.    COORDINATION OF CARE:    Labs Review Labs Reviewed - No data to display  Imaging Review No results found.    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 The recorded information has been reviewed and is accurate.    Lonia Skinner Sandy [place], PA-C 12/03/14 1515  Tilden Fossa, MD 12/03/14 [phone]

## 2014-12-03 NOTE — ED Notes (Signed)
Case management consulted about home health and getting pt a walker. [facility] agrees to give her a walker here and case management will arrange home health when she is home. Currently waiting on walker.

## 2014-12-03 NOTE — ED Notes (Signed)
Pt has chronic left leg and hip pain. Pt sts that the pain has gotten much worse over the past couple of days. Pt has a follow up appointment with orthopaedic surgeon on the 16th but sts she is in too much pain to make it until then. Pt sts she needs pain control. Pt sts she can't even put weight on her leg because of severe pain. Pt has been ambulatory at home "Because she has to."  Pt A&Ox4.

## 2014-12-06 NOTE — Care Management Note (Signed)
12/06/14 1640 Cm received a voice message from [facility] at 1343  inquiring if pt needed wound care & who would be the MD signing off. CM spoke with pt at 1355 who confirms she has not received services at this time but is doing well.  [facility]m called [facility] to give up dated home number for pt as [phone] at 1359. Cm called [facility] and informed her of home health orders, pt's updated home number and name of rheumatologist with MD office number

## 2014-12-09 NOTE — Progress Notes (Signed)
ED CM received a message from April about pt services and need of clarification of EDP Evelyn [name] information for NPI number.  To fax to CM the from Dr Evelyn [name] Needs to sign. Cm clarified Dr Evelyn [name] recently moved to [place] from [place].  CM provided April with updated number for pt [phone], cm mobile number, name pronunciation for pcp "[facility]" and name & contact for Rheumatologist both in [place] for pt

## 2014-12-10 ENCOUNTER — Emergency Department (HOSPITAL_COMMUNITY)
Admission: EM | Admit: 2014-12-10 | Discharge: 2014-12-11 | Disposition: A | Discharge status: Home / Self Care | Payer: Medicaid - Out of State | Attending: Emergency Medicine | Admitting: Emergency Medicine

## 2014-12-10 ENCOUNTER — Encounter (HOSPITAL_COMMUNITY): Payer: Self-pay | Admitting: Emergency Medicine

## 2014-12-10 DIAGNOSIS — Z8659 Personal history of other mental and behavioral disorders: Secondary | ICD-10-CM | POA: Insufficient documentation

## 2014-12-10 DIAGNOSIS — Z79899 Other long term (current) drug therapy: Secondary | ICD-10-CM | POA: Insufficient documentation

## 2014-12-10 DIAGNOSIS — M069 Rheumatoid arthritis, unspecified: Secondary | ICD-10-CM | POA: Diagnosis not present

## 2014-12-10 DIAGNOSIS — Z720 Tobacco use: Secondary | ICD-10-CM | POA: Diagnosis not present

## 2014-12-10 DIAGNOSIS — M25551 Pain in right hip: Secondary | ICD-10-CM | POA: Insufficient documentation

## 2014-12-10 DIAGNOSIS — G8929 Other chronic pain: Secondary | ICD-10-CM | POA: Diagnosis not present

## 2014-12-10 DIAGNOSIS — Z8679 Personal history of other diseases of the circulatory system: Secondary | ICD-10-CM | POA: Insufficient documentation

## 2014-12-10 DIAGNOSIS — Z7952 Long term (current) use of systemic steroids: Secondary | ICD-10-CM | POA: Diagnosis not present

## 2014-12-10 DIAGNOSIS — Z862 Personal history of diseases of the blood and blood-forming organs and certain disorders involving the immune mechanism: Secondary | ICD-10-CM | POA: Diagnosis not present

## 2014-12-10 LAB — CBC WITH DIFFERENTIAL/PLATELET
Basophils Absolute: 0 10*3/uL (ref 0.0–0.1)
Basophils Relative: 0 % (ref 0–1)
Eosinophils Absolute: 0 10*3/uL (ref 0.0–0.7)
Eosinophils Relative: 1 % (ref 0–5)
HCT: 30.7 % — ABNORMAL LOW (ref 36.0–46.0)
HEMOGLOBIN: 9.8 g/dL — AB (ref 12.0–15.0)
LYMPHS ABS: 0.8 10*3/uL (ref 0.7–4.0)
Lymphocytes Relative: 29 % (ref 12–46)
MCH: 32.3 pg (ref 26.0–34.0)
MCHC: 31.9 g/dL (ref 30.0–36.0)
MCV: 101.3 fL — ABNORMAL HIGH (ref 78.0–100.0)
MONOS PCT: 13 % — AB (ref 3–12)
Monocytes Absolute: 0.4 10*3/uL (ref 0.1–1.0)
NEUTROS ABS: 1.6 10*3/uL — AB (ref 1.7–7.7)
Neutrophils Relative %: 57 % (ref 43–77)
Platelets: 245 10*3/uL (ref 150–400)
RBC: 3.03 MIL/uL — AB (ref 3.87–5.11)
RDW: 14.8 % (ref 11.5–15.5)
WBC: 2.8 10*3/uL — AB (ref 4.0–10.5)

## 2014-12-10 MED ORDER — MORPHINE SULFATE 4 MG/ML IJ SOLN
4.0000 mg | Freq: Once | INTRAMUSCULAR | Status: AC
  Administered 2014-12-10: 4 mg via INTRAVENOUS
  Filled 2014-12-10: qty 1

## 2014-12-10 NOTE — ED Provider Notes (Signed)
CSN: [id number]     Arrival date & time 12/10/14  2029 History   First MD Initiated Contact with Patient 12/10/14 2226     Chief Complaint  Patient presents with  . Leg Pain     (Consider location/radiation/quality/duration/timing/severity/associated sxs/prior Treatment) HPI   Evelyn [name] is a 33 y.o. female with past medical history significant for lupus, lupus nephritis, complaining of severe, atraumatic pain to right hip which has been worsening over the course of 6 weeks. 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 Patient has had MRI 2 weeks ago, she is scheduled to follow with orthopedic surgeon next week. She was seen last week and given a prescription for oxycodone which she has been taking in addition to Tylenol but it is not helping the pain. Patient is somewhat able to ambulate with a walker however she cannot climb the stairs her house. States that her brother has to carry her up the stairs so she can use the restroom. Patient states on further discussion that she is taking for 600 mg acetaminophen tablets every 4 hours and she has been doing this for 2 weeks. She denies abdominal pain, nausea, vomiting, diarrhea, fever, chest pain, shortness of breath.    Past Medical History  Diagnosis Date  . Headache(784.0)   . Anxiety   . Anemia   . Depression   . Lupus   . Lupus nephritis   . Pericardial effusion     Intermittent. Associated with lupus.  . Arthritis     rheumatoid   Past Surgical History  Procedure Laterality Date  . Dilation and evacuation  11/10  . Laparoscopic tubal ligation  07/10/2011    Procedure: LAPAROSCOPIC TUBAL LIGATION;  Surgeon: Purcell Nails, MD;  Location: [facility];  Service: Gynecology;  Laterality: Bilateral;  fulguration  . Tubal ligation     Family History  Problem Relation Age of Onset  . Hypertension Mother    History  Substance Use Topics  . Smoking  status: Current Every Day Smoker -- 0.25 packs/day for 7 years    Types: Cigarettes  . Smokeless tobacco: Never Used  . Alcohol Use: Yes     Comment: occasional    OB History    Gravida Para Term Preterm AB TAB SAB Ectopic Multiple Living   3 1 1  2  2   1      Review of Systems  10 systems reviewed and found to be negative, except as noted in the HPI.   Allergies  Metoclopramide hcl; Alpha blocker quinazolines; Ciprofloxacin; Nsaids; Pork-derived products; Sulfur; and Tramadol  Home Medications   Prior to Admission medications   Medication Sig Start Date End Date Taking? Authorizing Provider  acetaminophen (TYLENOL) 500 MG tablet Take 1,500 mg by mouth 2 (two) times daily as needed (lupus pain).    Yes Historical Provider, MD  diphenhydrAMINE (BENADRYL) 25 MG tablet Take 25 mg by mouth every 6 (six) hours as needed for allergies (allergies).    Yes Historical Provider, MD  hydroxychloroquine (PLAQUENIL) 200 MG tablet Take 1.5 tablets (300 mg total) by mouth daily. 09/28/14  Yes 09/30/14, MD  predniSONE (DELTASONE) 10 MG tablet Take 15 mg by mouth daily. For Lupus exacerbation. 09/17/13  Yes 09/19/13, PA-C  Prenatal Vit-Fe Fumarate-FA (PRENATAL VITAMIN PO) Take 2 tablets by mouth daily.   Yes Historical Provider, MD  prochlorperazine (COMPAZINE) 10 MG tablet Take 10 mg by  mouth 2 (two) times daily as needed for nausea or vomiting (headache).    Yes Historical Provider, MD  venlafaxine XR (EFFEXOR XR) 75 MG 24 hr capsule Take 1 capsule (75 mg total) by mouth daily with breakfast. 09/28/14  Yes Gara Kroner, MD  fluconazole (DIFLUCAN) 150 MG tablet Take 1 tablet (150 mg total) by mouth once. May repeat in 2 days if still having symptoms. Patient not taking: Reported on 12/10/2014 09/17/14   Tawnya Crook, CNM  hydroxychloroquine (PLAQUENIL) 200 MG tablet Take 1 tablet (200 mg total) by mouth 2 (two) times daily. For lupus. 09/17/13   Verne Spurr, PA-C  metroNIDAZOLE  (FLAGYL) 500 MG tablet Take 1 tablet (500 mg total) by mouth 2 (two) times daily. Patient not taking: Reported on 12/10/2014 09/17/14   Tawnya Crook, CNM  nitrofurantoin, macrocrystal-monohydrate, (MACROBID) 100 MG capsule Take 1 capsule (100 mg total) by mouth 2 (two) times daily. Patient not taking: Reported on 12/10/2014 09/17/14   Tawnya Crook, CNM  oxyCODONE (ROXICODONE) 5 MG immediate release tablet Take 1 tablet (5 mg total) by mouth every 6 (six) hours as needed for severe pain. Patient not taking: Reported on 12/10/2014 12/03/14   Elson Areas, PA-C  oxyCODONE (ROXICODONE) 5 MG immediate release tablet You may take up to 20 mg (4 pills) every 4-6 hours as needed for pain control. 12/11/14   Maitlyn Penza, PA-C  predniSONE (DELTASONE) 10 MG tablet Take 1 tablet (10 mg total) by mouth daily with breakfast. 09/28/14   Gara Kroner, MD   BP 108/74 mmHg  Pulse 82  Temp(Src) 98.1 F (36.7 C) (Oral)  Resp 13  SpO2 100%  LMP 09/03/2014 Physical Exam  Constitutional: She is oriented to person, place, and time. She appears well-developed and well-nourished. No distress.  HENT:  Head: Normocephalic.  Mouth/Throat: Oropharynx is clear and moist.  Eyes: Conjunctivae and EOM are normal. Pupils are equal, round, and reactive to light.  Neck: Normal range of motion.  Cardiovascular: Normal rate, regular rhythm and intact distal pulses.   Pulmonary/Chest: Effort normal and breath sounds normal. No stridor. No respiratory distress. She has no wheezes. She has no rales. She exhibits no tenderness.  Abdominal: Soft. Bowel sounds are normal. She exhibits no distension and no mass. There is no tenderness. There is no rebound and no guarding.  Musculoskeletal: Normal range of motion. She exhibits tenderness.  Patient is very tender to palpation on the right trochanter, no overlying skin changes, slightly reduced range of motion. Distally neurovascularly intact.  Neurological: She is  alert and oriented to person, place, and time.  Psychiatric: She has a normal mood and affect.  Nursing note and vitals reviewed.   ED Course  Procedures (including critical care time) Labs Review Labs Reviewed  COMPREHENSIVE METABOLIC PANEL - Abnormal; Notable for the following:    Potassium 3.6 (*)    BUN 4 (*)    Total Bilirubin <0.2 (*)    All other components within normal limits  CBC WITH DIFFERENTIAL - Abnormal; Notable for the following:    WBC 2.8 (*)    RBC 3.03 (*)    Hemoglobin 9.8 (*)    HCT 30.7 (*)    MCV 101.3 (*)    Neutro Abs 1.6 (*)    Monocytes Relative 13 (*)    All other components within normal limits  SALICYLATE LEVEL - Abnormal; Notable for the following:    Salicylate Lvl <2.0 (*)    All other components within  normal limits  APTT  PROTIME-INR  ACETAMINOPHEN LEVEL    Imaging Review No results found.   EKG Interpretation None      MDM   Final diagnoses:  Chronic right hip pain    Filed Vitals:   12/10/14 2105 12/10/14 2344 12/11/14 0148  BP: 106/70  108/74  Pulse: 88  82  Temp:  98.1 F (36.7 C)   TempSrc:  Oral   Resp: 20  13  SpO2: 100%  100%    Medications  0.9 %  sodium chloride infusion ( Intravenous Stopped 12/11/14 0113)  morphine 4 MG/ML injection 4 mg (4 mg Intravenous Given 12/10/14 2343)  HYDROmorphone (DILAUDID) injection 0.5 mg (0.5 mg Intravenous Given 12/11/14 0021)  HYDROmorphone (DILAUDID) injection 0.5 mg (0.5 mg Intravenous Given 12/11/14 0113)    Evelyn [name] is a pleasant 33 y.o. female presenting with severe atraumatic pain to right hip patient is on steroids chronically for her lupus and it is causing avascular necrosis. She had MRI 2 weeks ago. She is neurovascularly intact. More concerning patient has been taking well over the daily maximum value of acetaminophen for 2 weeks. LFTs, clotting ordering studies ordered. Patient has normal LFTs, acetaminophen level is undetectable. I have discussed the  case with poison control, they recommend no further workup at this time. I had extensive discussion with patient about maximum safe dosage of acetaminophen. Patient understands that what she did was not advised however she states that she didn't want to come to the ED because she didn't want to feel like a "junky." Advised Pt to please com eto the ED before she takes more than reccommended APAP dose. Patient will be given crutches, she will follow with her orthopedic surgeon next week.  Evaluation does not show pathology that would require ongoing emergent intervention or inpatient treatment. Pt is hemodynamically stable and mentating appropriately. Discussed findings and plan with patient/guardian, who agrees with care plan. All questions answered. Return precautions discussed and outpatient follow up given.   New Prescriptions   OXYCODONE (ROXICODONE) 5 MG IMMEDIATE RELEASE TABLET    You may take up to 20 mg (4 pills) every 4-6 hours as needed for pain control.         Wynetta Emery, PA-C 12/11/14 0201  April K Palumbo-Rasch, MD 12/11/14 [phone]

## 2014-12-10 NOTE — ED Notes (Signed)
Pt reports R hip and knee pain with degeneration from Lupus medications. Pt unable to ambulate d/t pain.

## 2014-12-11 LAB — COMPREHENSIVE METABOLIC PANEL
ALT: 35 U/L (ref 0–35)
ANION GAP: 13 (ref 5–15)
AST: 31 U/L (ref 0–37)
Albumin: 3.6 g/dL (ref 3.5–5.2)
Alkaline Phosphatase: 70 U/L (ref 39–117)
BUN: 4 mg/dL — AB (ref 6–23)
CO2: 28 meq/L (ref 19–32)
CREATININE: 0.56 mg/dL (ref 0.50–1.10)
Calcium: 9.1 mg/dL (ref 8.4–10.5)
Chloride: 101 mEq/L (ref 96–112)
GFR calc Af Amer: >90 mL/min (ref >90)
GLUCOSE: 73 mg/dL (ref 70–99)
Potassium: 3.6 mEq/L — ABNORMAL LOW (ref 3.7–5.3)
SODIUM: 142 meq/L (ref 137–147)
TOTAL PROTEIN: 7.6 g/dL (ref 6.0–8.3)
Total Bilirubin: <0.2 mg/dL — ABNORMAL LOW (ref 0.3–1.2)

## 2014-12-11 LAB — SALICYLATE LEVEL

## 2014-12-11 LAB — PROTIME-INR
INR: 0.97 (ref 0.00–1.49)
PROTHROMBIN TIME: 13 s (ref 11.6–15.2)

## 2014-12-11 LAB — APTT: APTT: 31 s (ref 24–37)

## 2014-12-11 LAB — ACETAMINOPHEN LEVEL: Acetaminophen (Tylenol), Serum: <15 ug/mL (ref 10–30)

## 2014-12-11 MED ORDER — OXYCODONE HCL 5 MG PO TABS: ORAL_TABLET | ORAL | Status: DC

## 2014-12-11 MED ORDER — HYDROMORPHONE HCL 1 MG/ML IJ SOLN
0.5000 mg | Freq: Once | INTRAMUSCULAR | Status: AC
  Administered 2014-12-11: 0.5 mg via INTRAVENOUS
  Filled 2014-12-11: qty 1

## 2014-12-11 MED ORDER — SODIUM CHLORIDE 0.9 % IV SOLN
INTRAVENOUS | Status: DC
  Administered 2014-12-11: via INTRAVENOUS

## 2014-12-11 NOTE — ED Notes (Signed)
[facility] called to follow up on pt tylenol level.

## 2014-12-11 NOTE — Discharge Instructions (Signed)
Take oxycodone for breakthrough pain, do not drink alcohol, drive, care for children or do other critical tasks while taking oxycodone.  Please be very careful not to fall! The pain medication and crutches  puts you at risk for falls. Please rest as much as possible and try to not stay alone.   Please follow with your primary care doctor in the next 2 days for a check-up. They must obtain records for further management.   Do not hesitate to return to the Emergency Department for any new, worsening or concerning symptoms.

## 2015-06-05 ENCOUNTER — Encounter: Admit: 2015-06-06

## 2015-06-05 ENCOUNTER — Inpatient Hospital Stay
Admission: EM | Admit: 2015-06-06 | Discharge: 2015-06-07 | Disposition: A | Discharge status: Home / Self Care | Source: Home / Self Care | Admitting: Gastroenterology

## 2015-06-05 LAB — CBC WITH AUTO DIFFERENTIAL
Basophils %: 0 % (ref 0–2)
Basophils Absolute: 0.02 E9/L (ref 0.00–0.20)
Eosinophils %: 0 % (ref 0–6)
Eosinophils Absolute: 0.01 E9/L — ABNORMAL LOW (ref 0.05–0.50)
Hematocrit: 34.5 % (ref 34.0–48.0)
Hemoglobin: 11.2 g/dL — ABNORMAL LOW (ref 11.5–15.5)
Lymphocytes %: 14 % — ABNORMAL LOW (ref 20–42)
Lymphocytes Absolute: 0.76 E9/L — ABNORMAL LOW (ref 1.50–4.00)
MCH: 28.6 pg (ref 26.0–35.0)
MCHC: 32.5 % (ref 32.0–34.5)
MCV: 88 fL (ref 80.0–99.9)
MPV: 7.4 fL (ref 7.0–12.0)
Monocytes %: 7 % (ref 2–12)
Monocytes Absolute: 0.36 E9/L (ref 0.10–0.95)
Neutrophils %: 78 % (ref 43–80)
Neutrophils Absolute: 4.14 E9/L (ref 1.80–7.30)
Platelets: 249 E9/L (ref 130–450)
RBC: 3.92 E12/L (ref 3.50–5.50)
RDW: 13.3 fL (ref 11.5–15.0)
WBC: 5.3 E9/L (ref 4.5–11.5)

## 2015-06-05 LAB — URINALYSIS
Bilirubin Urine: NEGATIVE
Blood, Urine: NEGATIVE
Glucose, Ur: NEGATIVE mg/dL
Ketones, Urine: NEGATIVE mg/dL
Leukocyte Esterase, Urine: NEGATIVE
Nitrite, Urine: NEGATIVE
Protein, UA: 30 mg/dL — AB
Specific Gravity, UA: 1.015 (ref 1.005–1.030)
Urobilinogen, Urine: 0.2 E.U./dL (ref <2.0)
pH, UA: 8.5 (ref 5.0–9.0)

## 2015-06-05 LAB — COMPREHENSIVE METABOLIC PANEL
ALT: 17 U/L (ref 0–32)
AST: 22 U/L (ref 0–31)
Albumin: 4.4 g/dL (ref 3.5–5.2)
Alkaline Phosphatase: 70 U/L (ref 35–104)
Anion Gap: 14 mmol/L (ref 7–16)
BUN: 9 mg/dL (ref 6–20)
CO2: 26 mmol/L (ref 22–29)
Calcium: 10 mg/dL (ref 8.6–10.2)
Chloride: 98 mmol/L (ref 98–107)
Creatinine: 0.6 mg/dL (ref 0.5–1.0)
GFR African American: >60
GFR Non-African American: >60 mL/min/{1.73_m2} (ref >=60)
Glucose: 85 mg/dL (ref 74–109)
Potassium: 4.2 mmol/L (ref 3.5–5.0)
Sodium: 138 mmol/L (ref 132–146)
Total Bilirubin: 0.4 mg/dL (ref 0.0–1.2)
Total Protein: 8.8 g/dL — ABNORMAL HIGH (ref 6.4–8.3)

## 2015-06-05 LAB — EKG 12-LEAD
Atrial Rate: 81 {beats}/min
P Axis: 75 degrees
P-R Interval: 142 ms
Q-T Interval: 406 ms
QRS Duration: 80 ms
QTc Calculation (Bazett): 471 ms
R Axis: 80 degrees
T Axis: 13 degrees
Ventricular Rate: 81 {beats}/min

## 2015-06-05 LAB — MICROSCOPIC URINALYSIS

## 2015-06-05 MED ORDER — FENTANYL CITRATE (PF) 100 MCG/2ML IJ SOLN
100 MCG/2ML | Freq: Once | INTRAMUSCULAR | Status: AC
Start: 2015-06-05 — End: 2015-06-05
  Administered 2015-06-06: 50 ug via INTRAVENOUS

## 2015-06-05 MED ORDER — SODIUM CHLORIDE 0.9 % IV BOLUS
0.9 % | Freq: Once | INTRAVENOUS | Status: AC
Start: 2015-06-05 — End: 2015-06-05
  Administered 2015-06-06: 1000 mL via INTRAVENOUS

## 2015-06-05 MED FILL — SODIUM CHLORIDE 0.9 % IV SOLN: 0.9 % | INTRAVENOUS | Qty: 1000

## 2015-06-05 MED FILL — FENTANYL CITRATE (PF) 100 MCG/2ML IJ SOLN: 100 MCG/2ML | INTRAMUSCULAR | Qty: 2

## 2015-06-05 NOTE — ED Provider Notes (Signed)
---------- SUBJECTIVE ----------  CC: Lupus      HPI:  Tina Lutz is a 34 y.o. Female, who presents complaining of lupus flair. She has a hx of SLE and does not know what is usually done for a flair. She says she has a doctor in [place], where she lives, but is in town visiting. She reports she has whole body aching. She has no focal symptoms. She specifically denies chest pain. She has not taken anything to make it feel better.    ROS:  All bolded are positive. All un-bolded are negative.  CONSTITUTIONAL: fevers, chills, fatigue  HEENT: otalgia, sore throat  RESPIRATORY: cough, shortness of breath  CARDIOVASCULAR: chest pain, lightheadedness  GASTROINTESTINAL: nausea, vomiting, diarrhea, constipation, abdominal pain, melena, hematochezia  GENITOURINARY: dysuria, hematuria  INTEGUMENT: rash, pruritus  HEMATOLOGIC/LYMPHATIC: easy bruising, swelling/edema  ENDOCRINE: weight changes, change in bowel habits  MUSCULOSKELETAL: pain, muscle weakness  NEUROLOGICAL: headache, vertigo  The remainder of ROS is as recorded in the HPI.    Past Medical History:  has a past medical history of Lupus (HCC); Lupus nephritis (HCC); Pericardial effusion without cardiac tamponade; and Osteonecrosis (HCC).  Past Surgical History:  has past surgical history that includes hip surgery; Dilation and curettage of uterus; and Tubal ligation.  Social History:  reports that she has been smoking.  She does not have any smokeless tobacco history on file. She reports that she drinks alcohol. She reports that she uses illicit drugs (Marijuana).  Family History: family history is not on file.  Allergies: Nsaids; Reglan; and Sulfa antibiotics  Home Medications:   Prior to Admission medications    Medication Sig Start Date End Date Taking? Authorizing Provider   predniSONE (DELTASONE) 5 MG tablet Take 7.5 mg by mouth daily   Yes Historical Provider, MD   hydroxychloroquine (PLAQUENIL) 200 MG tablet Take 200 mg by mouth 2 times daily   Yes  Historical Provider, MD       ---------- OBJECTIVE ----------  Physical Exam:  CONSTITUTIONAL: Alert. No acute distress.  HEAD: Normocephalic, atraumatic.  EYES: PERRL, no discharge or conjunctival injection.  EARS: External ears without lesions.  THROAT: Airway patent.  NECK: Normal ROM. Supple.  LUNGS: Clear to auscultation and breath sounds equal bilaterally.  HEART: Regular rate, regular rhythm, normal heart sounds, without murmur, gallop, or rub.  ABDOMEN: Soft and nondistended. Nontender.  SKIN: Warm, dry, without visible rash.  MSK: No tenderness or edema noted.  NEUROLOGICAL: Oriented. Motor functions intact.    ED Vitals:  Patient Vitals for the past 24 hrs:   BP Temp Temp src Pulse Resp SpO2 Height Weight   06/05/15 1823 105/56 mmHg 98.7 ??F (37.1 ??C) Oral 98 18 100 % 5' 5.5" (1.664 m) 115 lb (52.164 kg)       Labs:  Results for orders placed or performed during the hospital encounter of 06/05/15   CBC Auto Differential   Result Value Ref Range    WBC 5.3 4.5 - 11.5 E9/L    RBC 3.92 3.50 - 5.50 E12/L    Hemoglobin 11.2 (L) 11.5 - 15.5 g/dL    Hematocrit 16.1 09.6 - 48.0 %    MCV 88.0 80.0 - 99.9 fL    MCH 28.6 26.0 - 35.0 pg    MCHC 32.5 32.0 - 34.5 %    RDW 13.3 11.5 - 15.0 fL    Platelets 249 130 - 450 E9/L    MPV 7.4 7.0 - 12.0 fL    Neutrophils Relative  78 43 - 80 %    Lymphocytes Relative 14 (L) 20 - 42 %    Monocytes Relative 7 2 - 12 %    Eosinophils Relative Percent 0 0 - 6 %    Basophils Relative 0 0 - 2 %    Neutrophils Absolute 4.14 1.80 - 7.30 E9/L    Lymphocytes Absolute 0.76 (L) 1.50 - 4.00 E9/L    Monocytes Absolute 0.36 0.10 - 0.95 E9/L    Eosinophils Absolute 0.01 (L) 0.05 - 0.50 E9/L    Basophils Absolute 0.02 0.00 - 0.20 E9/L   Comprehensive Metabolic Panel   Result Value Ref Range    Sodium 138 132 - 146 mmol/L    Potassium 4.2 3.5 - 5.0 mmol/L    Chloride 98 98 - 107 mmol/L    CO2 26 22 - 29 mmol/L    Anion Gap 14 7 - 16 mmol/L    Glucose 85 74 - 109 mg/dL    BUN 9 6 - 20 mg/dL     CREATININE 0.6 0.5 - 1.0 mg/dL    GFR Non-African American >60 >=60 mL/min/1.73    GFR African American >60     Calcium 10.0 8.6 - 10.2 mg/dL    Total Protein 8.8 (H) 6.4 - 8.3 g/dL    Alb 4.4 3.5 - 5.2 g/dL    Total Bilirubin 0.4 0.0 - 1.2 mg/dL    Alkaline Phosphatase 70 35 - 104 U/L    ALT 17 0 - 32 U/L    AST 22 0 - 31 U/L   Sedimentation Rate   Result Value Ref Range    Sed Rate 56 (H) 0 - 20 mm/Hr   Urinalysis   Result Value Ref Range    Color, UA Yellow Straw/Yellow    Clarity, UA Clear Clear    Glucose, Ur Negative Negative mg/dL    Bilirubin Urine Negative Negative    Ketones, Urine Negative Negative mg/dL    Specific Gravity, UA 1.015 1.005 - 1.030    Blood, Urine Negative Negative    pH, UA 8.5 5.0 - 9.0    Protein, UA 30 (A) Negative mg/dL    Urobilinogen, Urine 0.2 < 2.0 E.U./dL    Nitrite, Urine Negative Negative    Leukocyte Esterase, Urine Negative Negative   Troponin   Result Value Ref Range    Troponin <0.01 0.00 - 0.03 ng/mL   Microscopic Urinalysis   Result Value Ref Range    WBC, UA 0-1 0 - 5 /HPF    RBC, UA 0-1 0 - 2 /HPF    Epi Cells FEW /HPF    Bacteria, UA FEW (A) /HPF    Amorphous, UA FEW    EKG 12 Lead   Result Value Ref Range    Ventricular Rate 81 BPM    Atrial Rate 81 BPM    P-R Interval 142 ms    QRS Duration 80 ms    Q-T Interval 406 ms    QTc Calculation (Bazett) 471 ms    P Axis 75 degrees    R Axis 80 degrees    T Axis 13 degrees       Radiology Review:  XR Chest Standard TWO VW   Final Result   IMPRESSION:  No acute cardiopulmonary disease.           EKG  Time: 1953  This EKG is signed and interpreted by the Emergency Department Physician.    Rhythm: sinus rhythm  Rate: 81  Interpretation: nonspecific up-sloping ST depression in the inferior leads  Comparison: no prior    ---------- ED COURSE/MEDICAL DECISION MAKING ----------  ED Meds:  Medications   fentaNYL (SUBLIMAZE) injection 50 mcg (50 mcg Intravenous Given 06/05/15 2004)   0.9 % sodium chloride bolus (1,000 mLs Intravenous  New Bag 06/05/15 2001)   oxyCODONE-acetaminophen (PERCOCET) 5-325 MG per tablet 1 tablet (1 tablet Oral Given 06/05/15 2126)       Course/MDM:  This patient presents with complaints of lupus exacerbation and has no local follow-up because she is from out of town. She has proteinuria as well as elevated sed rate. She has continued pain. At this time, we will request hospital observation to follow her proteinuria due to her history of lupus nephritis.    Time: 9:31 PM  Spoke with Dr. Jenness Corner NP, Selena Batten (Medicine).  Discussed case.  They will admit this patient.    ---------- IMPRESSION AND DISPOSITION ----------  Assessment:  1. Lupus (HCC)    2. Proteinuria        Disposition:  Admit to med/surg floor  Patient's condition is stable      Ether Griffins, DO  Resident  06/05/15 2132

## 2015-06-05 NOTE — ED Notes (Signed)
Floor called - pt ready for transport to floor - spoke to alex      Laryn Venning, RN  06/05/15 2222

## 2015-06-05 NOTE — H&P (Signed)
[facility] History and Physical      CHIEF COMPLAINT:  Lupus Flare, Uncontrolled pain, Proteinuria, Headache    History of Present Illness:  34 yo female observation admission for c/o lupus flare onset yesterday with progressive worsening of generalized body and joint aching severe dull aching for which she has taken her usual lupus medications: Plaquenil and Prednisone without help. She does not know what makes it better other than hospitalization and pain medications. There is no joint are of redness or swelling.  She is visiting her family here from [place] with plan to return after this weekend by car. Her complains also includes a severe global dull aching to throbbing HA, w/o associated neck pain, visual changes or disturbances, N/V/abd pain. Relates this is not the worst HA of her life and has had a similar HA in the past with these symptoms all being similar in nature to her typical lupus flares. Of significance she tells of a history of pericardial effusion and lupus nephritis over 1 year ago for which she received treatment when hospitalized. At this time she Denies F/C/N/V/SOB/CP/Abd Pain/visual changes or eye pain/fall, injury, or other trauma/LOC or Syncope/Lightheadedness/change in sensation, numbness, tingling, function of the neck, facial structures, B/L upper and lower extremities /change in function of or incontinence of bowel or bladder /dysuria or hematuria.       Informant(s) for H&P: Patient    REVIEW OF SYSTEMS:  Review of Systems   Constitutional: Positive for malaise/fatigue. Negative for fever, chills, weight loss and diaphoresis.   HENT: Positive for ear pain. Negative for congestion, ear discharge, hearing loss, nosebleeds, sore throat and tinnitus.    Eyes: Negative for blurred vision, double vision, photophobia, pain, discharge and redness.   Respiratory: Negative for cough, hemoptysis, sputum production, shortness of breath, wheezing and stridor.    Cardiovascular: Negative  for chest pain, palpitations and leg swelling.   Gastrointestinal: Positive for diarrhea. Negative for heartburn, nausea, vomiting, abdominal pain, constipation, blood in stool and melena.   Genitourinary: Negative for dysuria, urgency, frequency, hematuria and flank pain.   Musculoskeletal: Positive for myalgias and joint pain. Negative for back pain, falls and neck pain.   Skin: Negative for itching and rash.   Neurological: Positive for weakness and headaches. Negative for dizziness, tingling, tremors, sensory change, speech change, focal weakness, seizures and loss of consciousness.   Endo/Heme/Allergies: Negative for environmental allergies and polydipsia. Does not bruise/bleed easily.   Psychiatric/Behavioral: Positive for depression (Hx of with no current complaint) and substance abuse (Marijuana). Negative for suicidal ideas, hallucinations and memory loss. The patient is not nervous/anxious and does not have insomnia.          PMH:  Past Medical History   Diagnosis Date   . Lupus (HCC)    . Lupus nephritis (HCC)    . Pericardial effusion without cardiac tamponade    . Osteonecrosis (HCC)      right hip       Surgical History:  Past Surgical History   Procedure Laterality Date   . Hip surgery       for osteonecrosis   . Dilation and curettage of uterus     . Tubal ligation         Medications Prior to Admission:    Prior to Admission medications    Medication Sig Start Date End Date Taking? Authorizing Provider   predniSONE (DELTASONE) 5 MG tablet Take 7.5 mg by mouth daily   Yes Historical Provider, MD  hydroxychloroquine (PLAQUENIL) 200 MG tablet Take 200 mg by mouth 2 times daily   Yes Historical Provider, MD       Allergies:    Nsaids; Reglan; and Sulfa antibiotics    Social History:    reports that she has been smoking.  She does not have any smokeless tobacco history on file. She reports that she drinks alcohol. She reports that she uses illicit drugs (Marijuana).    Family History:   family history  is not on file.      PHYSICAL EXAM:  Vitals:  BP 105/56 mmHg  Pulse 98  Temp(Src) 98.7 F (37.1 C) (Oral)  Resp 18  Ht 5' 5.5" (1.664 m)  Wt 115 lb (52.164 kg)  BMI 18.84 kg/m2  SpO2 100%  LMP 04/08/2015 (Within Days)  Breastfeeding? No  General Appearance: alert and oriented to person, place and time, well-developed and well nourished and pleasant and engaging, uncomfortable appearing with movement  Skin: warm and dry, no rash or erythema  Head: normocephalic and atraumatic  Eyes: pupils equal, round, and reactive to light, extraocular eye movements intact, conjunctivae normal  ENT: tympanic membranes B/l normal, external ear and ear canal normal bilaterally w/ moderate wax present, oropharynx clear and moist with normal mucous membranes  Neck: neck supple and non tender without mass and FROM w/o pain , no cervical vertebral bone tenderness throughout  Pulmonary/Chest: clear to auscultation bilaterally- no wheezes, rales or rhonchi, normal air movement, no respiratory distress  Cardiovascular: normal rate, normal S1 and S2, no gallops, intact distal pulses, no carotid bruits and no JVD  Abdomen: soft, non-tender, non-distended, normal bowel sounds, no masses or organomegaly  Extremities: no edema and no varicoisites  Musculoskeletal: normal range of motion, no joint swelling, deformity or tenderness, no swollen joints, no joint deformity, no tender joints, no crepitus, no joint instability and generalized muscular tenderness w/o specificity, no erythema, no edema, no lesions  Neurologic: Face is symmetric (VII), EOMs are intact (III, IV, VI), pupils are equal and reactive (II, III), tongue is midline (XII). The patient is  talking w/ appropriate content and fluency, is fully attentive, and provided a spontaneous coherent history. Ambulation is not observed.         LABS:  Recent Labs      06/05/15   1925   NA  138   K  4.2   CL  98   CO2  26   BUN  9   CREATININE  0.6   GLUCOSE  85   CALCIUM  10.0        Recent Labs      06/05/15   1925   WBC  5.3   RBC  3.92   HGB  11.2*   HCT  34.5   MCV  88.0   MCH  28.6   MCHC  32.5   RDW  13.3   PLT  249   MPV  7.4       No results for input(s): POCGLU in the last 72 hours.    Labs:  Results for orders placed or performed during the hospital encounter of 06/05/15    CBC Auto Differential    Result  Value  Ref Range     WBC  5.3  4.5 - 11.5 E9/L     RBC  3.92  3.50 - 5.50 E12/L     Hemoglobin  11.2 (L)  11.5 - 15.5 g/dL     Hematocrit  16.1  09.6 -  48.0 %     MCV  88.0  80.0 - 99.9 fL     MCH  28.6  26.0 - 35.0 pg     MCHC  32.5  32.0 - 34.5 %     RDW  13.3  11.5 - 15.0 fL     Platelets  249  130 - 450 E9/L     MPV  7.4  7.0 - 12.0 fL     Neutrophils Relative  78  43 - 80 %     Lymphocytes Relative  14 (L)  20 - 42 %     Monocytes Relative  7  2 - 12 %     Eosinophils Relative Percent  0  0 - 6 %     Basophils Relative  0  0 - 2 %     Neutrophils Absolute  4.14  1.80 - 7.30 E9/L     Lymphocytes Absolute  0.76 (L)  1.50 - 4.00 E9/L     Monocytes Absolute  0.36  0.10 - 0.95 E9/L     Eosinophils Absolute  0.01 (L)  0.05 - 0.50 E9/L     Basophils Absolute  0.02  0.00 - 0.20 E9/L    Comprehensive Metabolic Panel    Result  Value  Ref Range     Sodium  138  132 - 146 mmol/L     Potassium  4.2  3.5 - 5.0 mmol/L     Chloride  98  98 - 107 mmol/L     CO2  26  22 - 29 mmol/L     Anion Gap  14  7 - 16 mmol/L     Glucose  85  74 - 109 mg/dL     BUN  9  6 - 20 mg/dL     CREATININE  0.6  0.5 - 1.0 mg/dL     GFR Non-African American  >60  >=60 mL/min/1.73     GFR African American  >60      Calcium  10.0  8.6 - 10.2 mg/dL     Total Protein  8.8 (H)  6.4 - 8.3 g/dL     Alb  4.4  3.5 - 5.2 g/dL     Total Bilirubin  0.4  0.0 - 1.2 mg/dL     Alkaline Phosphatase  70  35 - 104 U/L     ALT  17  0 - 32 U/L     AST  22  0 - 31 U/L    Sedimentation Rate    Result  Value  Ref Range     Sed Rate  56 (H)  0 - 20 mm/Hr    Urinalysis    Result  Value  Ref Range     Color, UA  Yellow  Straw/Yellow      Clarity, UA  Clear  Clear     Glucose, Ur  Negative  Negative mg/dL     Bilirubin Urine  Negative  Negative     Ketones, Urine  Negative  Negative mg/dL     Specific Gravity, UA  1.015  1.005 - 1.030     Blood, Urine  Negative  Negative     pH, UA  8.5  5.0 - 9.0     Protein, UA  30 (A)  Negative mg/dL     Urobilinogen, Urine  0.2  < 2.0 E.U./dL     Nitrite, Urine  Negative  Negative     Leukocyte Esterase, Urine  Negative  Negative    Troponin    Result  Value  Ref Range     Troponin  <0.01  0.00 - 0.03 ng/mL    Microscopic Urinalysis    Result  Value  Ref Range     WBC, UA  0-1  0 - 5 /HPF     RBC, UA  0-1  0 - 2 /HPF     Epi Cells  FEW  /HPF     Bacteria, UA  FEW (A)  /HPF     Amorphous, UA  FEW     EKG 12 Lead    Result  Value  Ref Range     Ventricular Rate  81  BPM     Atrial Rate  81  BPM     P-R Interval  142  ms     QRS Duration  80  ms     Q-T Interval  406  ms     QTc Calculation (Bazett)  471  ms     P Axis  75  degrees     R Axis  80  degrees     T Axis  13  degrees    EKG no STEMI or acute EKG changes    Radiology Review:  XR Chest Standard TWO VW    Final Result    IMPRESSION: No acute cardiopulmonary disease.                Radiology: Xr Chest Standard Two Vw    06/05/2015   Location:200  Exam: XR CHEST STANDARD TWO VW  Comparison: None.  History: Pain  Findings: PA and lateral views were obtained.  Heart size is normal. Pulmonary vessels are nondistended.  No acute pulmonary parenchymal consolidation.      06/05/2015   IMPRESSION:  No acute cardiopulmonary disease.         ASSESSMENT:    1. Lupus Flare  2. Uncontrolled pain  3. Headache  4. Proteinuria  5. Generalized body and joint pain    Active Problems:    Lupus (systemic lupus erythematosus) (HCC)    Uncontrolled pain    Proteinuria      PLAN:    1. Observation admit to medical surgical unit  2. CT brain w/o contrast  3. Additional lab evaluation/review  4. Pain management  5. Steroids Oral  6. IV hydration  7. Anticipate 24 hour stay    Code  Status: Full  DVT prophylaxis: Lovenox        Electronically signed by Baruch Gouty, CNP on 06/05/2015 at 10:03 PM

## 2015-06-05 NOTE — ED Notes (Signed)
patient in Xray    Rinaldo RatelKelly R Jacqlyn Marolf, [place]  06/05/15 2052

## 2015-06-06 ENCOUNTER — Observation Stay: Admit: 2015-06-06

## 2015-06-06 DIAGNOSIS — IMO0002 Reserved for concepts with insufficient information to code with codable children: Secondary | ICD-10-CM

## 2015-06-06 DIAGNOSIS — M329 Systemic lupus erythematosus, unspecified: Secondary | ICD-10-CM

## 2015-06-06 LAB — COMPREHENSIVE METABOLIC PANEL
ALT: 12 U/L (ref 0–32)
AST: 13 U/L (ref 0–31)
Albumin: 3.7 g/dL (ref 3.5–5.2)
Alkaline Phosphatase: 63 U/L (ref 35–104)
Anion Gap: 13 mmol/L (ref 7–16)
BUN: 9 mg/dL (ref 6–20)
CO2: 25 mmol/L (ref 22–29)
Calcium: 8.9 mg/dL (ref 8.6–10.2)
Chloride: 100 mmol/L (ref 98–107)
Creatinine: 0.6 mg/dL (ref 0.5–1.0)
GFR African American: >60
GFR Non-African American: >60 mL/min/{1.73_m2} (ref >=60)
Glucose: 127 mg/dL — ABNORMAL HIGH (ref 74–109)
Potassium: 3.4 mmol/L — ABNORMAL LOW (ref 3.5–5.0)
Sodium: 138 mmol/L (ref 132–146)
Total Bilirubin: 0.3 mg/dL (ref 0.0–1.2)
Total Protein: 7.1 g/dL (ref 6.4–8.3)

## 2015-06-06 LAB — CBC WITH AUTO DIFFERENTIAL
Basophils %: 0 % (ref 0–2)
Basophils Absolute: 0.01 E9/L (ref 0.00–0.20)
Eosinophils %: 1 % (ref 0–6)
Eosinophils Absolute: 0.03 E9/L — ABNORMAL LOW (ref 0.05–0.50)
Hematocrit: 30.2 % — ABNORMAL LOW (ref 34.0–48.0)
Hemoglobin: 9.8 g/dL — ABNORMAL LOW (ref 11.5–15.5)
Lymphocytes %: 17 % — ABNORMAL LOW (ref 20–42)
Lymphocytes Absolute: 0.81 E9/L — ABNORMAL LOW (ref 1.50–4.00)
MCH: 28.9 pg (ref 26.0–35.0)
MCHC: 32.6 % (ref 32.0–34.5)
MCV: 88.6 fL (ref 80.0–99.9)
MPV: 7.5 fL (ref 7.0–12.0)
Monocytes %: 11 % (ref 2–12)
Monocytes Absolute: 0.51 E9/L (ref 0.10–0.95)
Neutrophils %: 72 % (ref 43–80)
Neutrophils Absolute: 3.48 E9/L (ref 1.80–7.30)
Platelets: 207 E9/L (ref 130–450)
RBC: 3.41 E12/L — ABNORMAL LOW (ref 3.50–5.50)
RDW: 13.3 fL (ref 11.5–15.0)
WBC: 4.8 E9/L (ref 4.5–11.5)

## 2015-06-06 LAB — TROPONIN: Troponin: <0.01 ng/mL (ref 0.00–0.03)

## 2015-06-06 LAB — C3 COMPLEMENT
C3 Complement: 67 mg/dL — ABNORMAL LOW (ref 90–180)
C3 Complement: 74 mg/dL — ABNORMAL LOW (ref 90–180)

## 2015-06-06 LAB — CREATININE, RANDOM URINE: Creatinine, Ur: 119 mg/dL (ref 29–226)

## 2015-06-06 LAB — C-REACTIVE PROTEIN
CRP: 0.1 mg/dL (ref 0.0–0.4)
CRP: 0.1 mg/dL (ref 0.0–0.4)
CRP: 0.1 mg/dL (ref 0.0–0.4)

## 2015-06-06 LAB — C4 COMPLEMENT
C4 Complement: 12 mg/dL (ref 10–40)
C4 Complement: 14 mg/dL (ref 10–40)

## 2015-06-06 LAB — SEDIMENTATION RATE: Sed Rate: 56 mm/Hr — ABNORMAL HIGH (ref 0–20)

## 2015-06-06 LAB — PROTEIN, URINE, RANDOM: Protein, Ur: 42 mg/dL — ABNORMAL HIGH (ref 0–12)

## 2015-06-06 MED ORDER — METHYLPREDNISOLONE 4 MG PO TABS
4 MG | Freq: Four times a day (QID) | ORAL | Status: DC
Start: 2015-06-06 — End: 2015-06-06
  Administered 2015-06-06: 04:00:00 20 mg via ORAL

## 2015-06-06 MED ORDER — NORMAL SALINE FLUSH 0.9 % IV SOLN
0.9 % | INTRAVENOUS | Status: AC
Start: 2015-06-06 — End: 2015-06-06
  Administered 2015-06-06: 07:00:00

## 2015-06-06 MED ORDER — SODIUM CHLORIDE 0.9 % IV SOLN
0.9 % | INTRAVENOUS | Status: DC
Start: 2015-06-06 — End: 2015-06-07
  Administered 2015-06-06 – 2015-06-07 (×3): 1000 mL via INTRAVENOUS

## 2015-06-06 MED ORDER — ACETAMINOPHEN 325 MG PO TABS
325 MG | ORAL | Status: DC | PRN
Start: 2015-06-06 — End: 2015-06-07
  Administered 2015-06-07: 12:00:00 650 mg via ORAL

## 2015-06-06 MED ORDER — OXYCODONE-ACETAMINOPHEN 5-325 MG PO TABS
5-325 MG | Freq: Once | ORAL | Status: AC
Start: 2015-06-06 — End: 2015-06-05
  Administered 2015-06-06: 01:00:00 1 via ORAL

## 2015-06-06 MED ORDER — VENLAFAXINE HCL ER 150 MG PO CP24
150 MG | Freq: Every day | ORAL | Status: DC
Start: 2015-06-06 — End: 2015-06-06

## 2015-06-06 MED ORDER — FAMOTIDINE 20 MG/2ML IV SOLN
20 MG/2ML | Freq: Two times a day (BID) | INTRAVENOUS | Status: DC
Start: 2015-06-06 — End: 2015-06-07
  Administered 2015-06-06 – 2015-06-07 (×4): 20 mg via INTRAVENOUS

## 2015-06-06 MED ORDER — PROCHLORPERAZINE EDISYLATE 5 MG/ML IJ SOLN
5 MG/ML | Freq: Four times a day (QID) | INTRAMUSCULAR | Status: DC | PRN
Start: 2015-06-06 — End: 2015-06-07

## 2015-06-06 MED ORDER — HYDROXYCHLOROQUINE SULFATE 200 MG PO TABS
200 MG | Freq: Two times a day (BID) | ORAL | Status: DC
Start: 2015-06-06 — End: 2015-06-07
  Administered 2015-06-06 – 2015-06-07 (×3): 200 mg via ORAL

## 2015-06-06 MED ORDER — NORMAL SALINE FLUSH 0.9 % IV SOLN
0.9 % | Freq: Two times a day (BID) | INTRAVENOUS | Status: DC
Start: 2015-06-06 — End: 2015-06-07

## 2015-06-06 MED ORDER — HYDROMORPHONE HCL 1 MG/ML IJ SOLN
1 MG/ML | INTRAMUSCULAR | Status: DC | PRN
Start: 2015-06-06 — End: 2015-06-06
  Administered 2015-06-06 (×3): 0.5 mg via INTRAVENOUS

## 2015-06-06 MED ORDER — HYDROMORPHONE HCL 1 MG/ML IJ SOLN
1 MG/ML | INTRAMUSCULAR | Status: DC | PRN
Start: 2015-06-06 — End: 2015-06-06

## 2015-06-06 MED ORDER — ONDANSETRON HCL 4 MG/2ML IJ SOLN
4 MG/2ML | Freq: Four times a day (QID) | INTRAMUSCULAR | Status: DC | PRN
Start: 2015-06-06 — End: 2015-06-07
  Administered 2015-06-06 – 2015-06-07 (×3): 4 mg via INTRAVENOUS

## 2015-06-06 MED ORDER — METHYLPREDNISOLONE SODIUM SUCC 40 MG IJ SOLR
40 MG | Freq: Four times a day (QID) | INTRAMUSCULAR | Status: DC
Start: 2015-06-06 — End: 2015-06-07
  Administered 2015-06-06 – 2015-06-07 (×4): 20 mg via INTRAVENOUS

## 2015-06-06 MED ORDER — ENOXAPARIN SODIUM 40 MG/0.4ML SC SOLN
40 MG/0.4ML | Freq: Every day | SUBCUTANEOUS | Status: DC
Start: 2015-06-06 — End: 2015-06-05

## 2015-06-06 MED ORDER — NICOTINE 14 MG/24HR TD PT24
14 MG/24HR | Freq: Every day | TRANSDERMAL | Status: DC
Start: 2015-06-06 — End: 2015-06-07
  Administered 2015-06-06 – 2015-06-07 (×2): 1 via TRANSDERMAL

## 2015-06-06 MED ORDER — NORMAL SALINE FLUSH 0.9 % IV SOLN
0.9 % | INTRAVENOUS | Status: DC | PRN
Start: 2015-06-06 — End: 2015-06-07

## 2015-06-06 MED ORDER — MAGNESIUM HYDROXIDE 400 MG/5ML PO SUSP
400 MG/5ML | Freq: Every day | ORAL | Status: DC | PRN
Start: 2015-06-06 — End: 2015-06-07

## 2015-06-06 MED ORDER — OXYCODONE-ACETAMINOPHEN 5-325 MG PO TABS
5-325 MG | ORAL | Status: DC | PRN
Start: 2015-06-06 — End: 2015-06-07

## 2015-06-06 MED ORDER — HYDROMORPHONE HCL 1 MG/ML IJ SOLN
1 MG/ML | INTRAMUSCULAR | Status: DC | PRN
Start: 2015-06-06 — End: 2015-06-07
  Administered 2015-06-06 – 2015-06-07 (×5): 1 mg via INTRAVENOUS

## 2015-06-06 MED ORDER — OXYCODONE-ACETAMINOPHEN 5-325 MG PO TABS
5-325 MG | ORAL | Status: DC | PRN
Start: 2015-06-06 — End: 2015-06-07
  Administered 2015-06-06: 04:00:00 2 via ORAL
  Administered 2015-06-06: 23:00:00 1 via ORAL
  Administered 2015-06-06 – 2015-06-07 (×4): 2 via ORAL

## 2015-06-06 MED FILL — FAMOTIDINE 20 MG/2ML IV SOLN: 20 MG/2ML | INTRAVENOUS | Qty: 2

## 2015-06-06 MED FILL — NICOTINE 14 MG/24HR TD PT24: 14 MG/24HR | TRANSDERMAL | Qty: 1

## 2015-06-06 MED FILL — OXYCODONE-ACETAMINOPHEN 5-325 MG PO TABS: 5-325 MG | ORAL | Qty: 2

## 2015-06-06 MED FILL — HYDROXYCHLOROQUINE SULFATE 200 MG PO TABS: 200 MG | ORAL | Qty: 1

## 2015-06-06 MED FILL — ONDANSETRON HCL 4 MG/2ML IJ SOLN: 4 MG/2ML | INTRAMUSCULAR | Qty: 2

## 2015-06-06 MED FILL — HYDROMORPHONE HCL 1 MG/ML IJ SOLN: 1 MG/ML | INTRAMUSCULAR | Qty: 1

## 2015-06-06 MED FILL — VENLAFAXINE HCL ER 150 MG PO CP24: 150 MG | ORAL | Qty: 1

## 2015-06-06 MED FILL — OXYCODONE-ACETAMINOPHEN 5-325 MG PO TABS: 5-325 MG | ORAL | Qty: 1

## 2015-06-06 MED FILL — METHYLPREDNISOLONE 4 MG PO TABS: 4 MG | ORAL | Qty: 5

## 2015-06-06 MED FILL — NORMAL SALINE FLUSH 0.9 % IV SOLN: 0.9 % | INTRAVENOUS | Qty: 10

## 2015-06-06 MED FILL — NORMAL SALINE FLUSH 0.9 % IV SOLN: 0.9 % | INTRAVENOUS | Qty: 20

## 2015-06-06 MED FILL — A-METHAPRED 40 MG IJ SOLR: 40 MG | INTRAMUSCULAR | Qty: 40

## 2015-06-06 NOTE — Other (Addendum)
Patient Acct Nbr:  [id number]  Primary AUTH/CERT:    Primary Insurance Company Name:   WELFARE  Primary Insurance Plan Name:  [facility]  Primary Insurance Group Number:    Primary Insurance Plan Type: X  Primary Insurance Policy Number:  [id number]

## 2015-06-07 LAB — CBC WITH AUTO DIFFERENTIAL
Basophils %: 0 % (ref 0–2)
Basophils Absolute: 0.01 E9/L (ref 0.00–0.20)
Eosinophils %: 0 % (ref 0–6)
Eosinophils Absolute: 0 E9/L — ABNORMAL LOW (ref 0.05–0.50)
Hematocrit: 30.5 % — ABNORMAL LOW (ref 34.0–48.0)
Hemoglobin: 9.9 g/dL — ABNORMAL LOW (ref 11.5–15.5)
Lymphocytes %: 12 % — ABNORMAL LOW (ref 20–42)
Lymphocytes Absolute: 0.61 E9/L — ABNORMAL LOW (ref 1.50–4.00)
MCH: 28.9 pg (ref 26.0–35.0)
MCHC: 32.6 % (ref 32.0–34.5)
MCV: 88.7 fL (ref 80.0–99.9)
MPV: 7.6 fL (ref 7.0–12.0)
Monocytes %: 6 % (ref 2–12)
Monocytes Absolute: 0.3 E9/L (ref 0.10–0.95)
Neutrophils %: 82 % — ABNORMAL HIGH (ref 43–80)
Neutrophils Absolute: 4.13 E9/L (ref 1.80–7.30)
Platelets: 216 E9/L (ref 130–450)
RBC: 3.43 E12/L — ABNORMAL LOW (ref 3.50–5.50)
RDW: 13 fL (ref 11.5–15.0)
WBC: 5.1 E9/L (ref 4.5–11.5)

## 2015-06-07 LAB — COMPREHENSIVE METABOLIC PANEL
ALT: 34 U/L — ABNORMAL HIGH (ref 0–32)
AST: 43 U/L — ABNORMAL HIGH (ref 0–31)
Albumin: 3.9 g/dL (ref 3.5–5.2)
Alkaline Phosphatase: 72 U/L (ref 35–104)
Anion Gap: 13 mmol/L (ref 7–16)
BUN: 11 mg/dL (ref 6–20)
CO2: 24 mmol/L (ref 22–29)
Calcium: 9.2 mg/dL (ref 8.6–10.2)
Chloride: 99 mmol/L (ref 98–107)
Creatinine: 0.6 mg/dL (ref 0.5–1.0)
GFR African American: >60
GFR Non-African American: >60 mL/min/{1.73_m2} (ref >=60)
Glucose: 133 mg/dL — ABNORMAL HIGH (ref 74–109)
Potassium: 4.9 mmol/L (ref 3.5–5.0)
Sodium: 136 mmol/L (ref 132–146)
Total Bilirubin: 0.2 mg/dL (ref 0.0–1.2)
Total Protein: 7.8 g/dL (ref 6.4–8.3)

## 2015-06-07 LAB — ANTI-DNA ANTIBODY, DOUBLE-STRANDED: Anti ds DNA: NEGATIVE

## 2015-06-07 MED ORDER — PREDNISONE 10 MG PO TABS
10 MG | ORAL_TABLET | Freq: Every day | ORAL | Status: AC
Start: 2015-06-07 — End: 2015-06-17

## 2015-06-07 MED ORDER — HYDROXYCHLOROQUINE SULFATE 200 MG PO TABS
200 MG | ORAL_TABLET | Freq: Two times a day (BID) | ORAL | Status: DC
Start: 2015-06-07 — End: 2016-11-07

## 2015-06-07 MED ORDER — OXYCODONE HCL 5 MG PO TABS
5 MG | ORAL_TABLET | ORAL | Status: AC | PRN
Start: 2015-06-07 — End: 2015-06-14

## 2015-06-07 MED ORDER — PREDNISONE 20 MG PO TABS
20 MG | Freq: Every day | ORAL | Status: DC
Start: 2015-06-07 — End: 2015-06-07
  Administered 2015-06-07: 14:00:00 40 mg via ORAL

## 2015-06-07 MED ORDER — OXYCODONE HCL 5 MG PO TABS
5 MG | ORAL | Status: DC | PRN
Start: 2015-06-07 — End: 2015-06-07
  Administered 2015-06-07 (×2): 5 mg via ORAL

## 2015-06-07 MED ORDER — VENLAFAXINE HCL ER 150 MG PO CP24
150 MG | Freq: Every evening | ORAL | Status: DC
Start: 2015-06-07 — End: 2015-06-07
  Administered 2015-06-07: 02:00:00 150 mg via ORAL

## 2015-06-07 MED FILL — PREDNISONE 20 MG PO TABS: 20 MG | ORAL | Qty: 2

## 2015-06-07 MED FILL — HYDROMORPHONE HCL 1 MG/ML IJ SOLN: 1 MG/ML | INTRAMUSCULAR | Qty: 1

## 2015-06-07 MED FILL — METHYLPREDNISOLONE SODIUM SUCC 40 MG IJ SOLR: 40 MG | INTRAMUSCULAR | Qty: 40

## 2015-06-07 MED FILL — ONDANSETRON HCL 4 MG/2ML IJ SOLN: 4 MG/2ML | INTRAMUSCULAR | Qty: 2

## 2015-06-07 MED FILL — OXYCODONE HCL 5 MG PO TABS: 5 MG | ORAL | Qty: 1

## 2015-06-07 MED FILL — VENLAFAXINE HCL ER 150 MG PO CP24: 150 MG | ORAL | Qty: 1

## 2015-06-07 MED FILL — MAPAP 325 MG PO TABS: 325 MG | ORAL | Qty: 2

## 2015-06-07 MED FILL — HYDROXYCHLOROQUINE SULFATE 200 MG PO TABS: 200 MG | ORAL | Qty: 1

## 2015-06-07 MED FILL — A-METHAPRED 40 MG IJ SOLR: 40 MG | INTRAMUSCULAR | Qty: 40

## 2015-06-07 MED FILL — NICOTINE 14 MG/24HR TD PT24: 14 MG/24HR | TRANSDERMAL | Qty: 1

## 2015-06-07 MED FILL — OXYCODONE-ACETAMINOPHEN 5-325 MG PO TABS: 5-325 MG | ORAL | Qty: 2

## 2015-06-07 MED FILL — FAMOTIDINE 20 MG/2ML IV SOLN: 20 MG/2ML | INTRAVENOUS | Qty: 2

## 2015-06-07 MED FILL — NORMAL SALINE FLUSH 0.9 % IV SOLN: 0.9 % | INTRAVENOUS | Qty: 10

## 2015-06-07 NOTE — Discharge Summary (Signed)
[facility] Phys[facility] Discharge Summary     Activity level: As tolerated     Diet: regular diet    Dispo: Home     Patient ID:  Tina Lutz  [id number]  34 y.o.  Sep 06, 1981    Admit date: 06/06/2015    Discharge date and time:  06/07/2015  10:21 AM    Admission Diagnoses: Active Problems:    Lupus (systemic lupus erythematosus) (HCC)    Uncontrolled pain    Proteinuria      Discharge Diagnoses: Active Problems:    Lupus (systemic lupus erythematosus) (HCC)    Uncontrolled pain    Proteinuria    Consults:  None    Procedures: None     History of Present Illness:  Tina Lutz is a 34yo female with history of SLE diagnosed 2009 (+ANA speckled 1:640), +dsDNA, +SSA, +anti-Smith, +RNP, +chromatin) complicated by alopecia, malar rash, photosensitivity, skin rash, mouth ulcers, arthralgias, pleural effusion, pericardial effusion with tamponade, LAD, lupus nephritis class IV (renal biopsy 2009), and 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 She is currently maintained on prednisone 7.5mg  daily and hydroxychloroquine  daily. She presented with joint pain 2/2 to her underlying Lupus.    Hospital Course:   Admitted and started on IV steroids and pain management. SLE serological markers were not consistent with active lupus flare. She was transitioned to PO steroid taper and PO pain meds. [place] reports did not show evidence of narcotic seeking, albeit she has received a monthly script of oxycodoneI (last was in 01/2015).    Discharge Exam:  Filed Vitals:    06/07/15 0706   BP: 125/90   Pulse: 70   Temp: 97.9 F (36.6 C)   Resp: 16   SpO2: 98%     General Appearance: alert and oriented to person, place and time, well-developed and well nourished and pleasant and engaging, uncomfortable appearing with movement  Skin: warm and dry, no rash or erythema  Head: normocephalic and atraumatic  Eyes: pupils equal, round, and reactive to light, extraocular eye movements intact, conjunctivae normal  ENT: tympanic membranes B/l  normal, external ear and ear canal normal bilaterally w/ moderate wax present, oropharynx clear and moist with normal mucous membranes  Neck: neck supple and non tender without mass and FROM w/o pain , no cervical vertebral bone tenderness throughout  Pulmonary/Chest: clear to auscultation bilaterally- no wheezes, rales or rhonchi, normal air movement, no respiratory distress  Cardiovascular: normal rate, normal S1 and S2, no gallops, intact distal pulses, no carotid bruits and no JVD  Abdomen: soft, non-tender, non-distended, normal bowel sounds, no masses or organomegaly  Extremities: no edema and no varicoisites  Musculoskeletal: normal range of motion, no joint swelling, deformity or tenderness, no swollen joints, no joint deformity, no tender joints, no crepitus, no joint instability and generalized muscular tenderness w/o specificity, no erythema, no edema, no lesions  Neurologic: Face is symmetric (VII), EOMs are intact (III, IV, VI), pupils are equal and reactive (II, III), tongue is midline (XII). The patient is talking w/ appropriate content and fluency, is fully attentive, and provided a spontaneous coherent history. Ambulation is not observed.     I/O last 3 completed shifts:  In: 2259 [P.O.:1060; I.V.:1199]  Out: 2000 [Urine:2000]    LABS:  Recent Labs      06/05/15   1925  06/06/15   0356  06/07/15   0459   NA  138  138  136   K  4.2  3.4*  4.9  CL  98  100  99   CO2  26  25  24    BUN  9  9  11    CREATININE  0.6  0.6  0.6   GLUCOSE  85  127*  133*   CALCIUM  10.0  8.9  9.2       Recent Labs      06/05/15   1925  06/06/15   0356  06/07/15   0459   WBC  5.3  4.8  5.1   RBC  3.92  3.41*  3.43*   HGB  11.2*  9.8*  9.9*   HCT  34.5  30.2*  30.5*   MCV  88.0  88.6  88.7   MCH  28.6  28.9  28.9   MCHC  32.5  32.6  32.6   RDW  13.3  13.3  13.0   PLT  249  207  216   MPV  7.4  7.5  7.6       Imaging:  Ct Head Wo Contrast    06/06/2015   Location:200  Exam: CT HEAD WO CONTRAST  Comparison: None.  History:  Headache  Findings: Noncontrast images were obtained through the brain parenchyma.    The ventricles are midline and nondilated.  No acute abnormality of the brain parenchyma, intracranial hemorrhage, mass effect or large extra-axial fluid collection.     06/06/2015   IMPRESSION:  No CT evidence of acute intracranial abnormality.     Patient Instructions:   Current Discharge Medication List      START taking these medications    Details   oxyCODONE (ROXICODONE) 5 MG immediate release tablet Take 1 tablet by mouth every 3 hours as needed  Qty: 100 tablet, Refills: 0      !! predniSONE (DELTASONE) 10 MG tablet Take 4 tablets by mouth daily for 10 days 4 tabs x 7 days, then 3 tabs x 7 days, then 2 tabs x 7 days, then 1 tab x 7 days, then continue on 7.5 mg for maintenance.  Qty: 121 tablet, Refills: 0       !! - Potential duplicate medications found. Please discuss with provider.      CONTINUE these medications which have CHANGED    Details   hydroxychloroquine (PLAQUENIL) 200 MG tablet Take 1 tablet by mouth 2 times daily  Qty: 60 tablet, Refills: 3         CONTINUE these medications which have NOT CHANGED    Details   !! predniSONE (DELTASONE) 5 MG tablet Take 7.5 mg by mouth daily      venlafaxine (EFFEXOR-XR) 150 MG XR capsule Take 150 mg by mouth daily       !! - Potential duplicate medications found. Please discuss with provider.          Note that more than 30 minutes was spent in preparing discharge papers, discussing discharge with patient, medication review, etc.    Signed:  Electronically signed by Yates DecampMohamed Welles Walthall, MD on 06/07/2015 at 10:21 AM

## 2015-06-08 LAB — ANTI-DNA ANTIBODY, DOUBLE-STRANDED: Anti ds DNA: NEGATIVE

## 2015-08-28 ENCOUNTER — Emergency Department (HOSPITAL_COMMUNITY): Payer: Medicaid - Out of State

## 2015-08-28 ENCOUNTER — Encounter (HOSPITAL_COMMUNITY): Payer: Self-pay

## 2015-08-28 ENCOUNTER — Emergency Department (HOSPITAL_COMMUNITY)
Admission: EM | Admit: 2015-08-28 | Discharge: 2015-08-28 | Disposition: A | Discharge status: Home / Self Care | Payer: Medicaid - Out of State | Attending: Emergency Medicine | Admitting: Emergency Medicine

## 2015-08-28 DIAGNOSIS — Z720 Tobacco use: Secondary | ICD-10-CM | POA: Insufficient documentation

## 2015-08-28 DIAGNOSIS — R51 Headache: Secondary | ICD-10-CM | POA: Diagnosis not present

## 2015-08-28 DIAGNOSIS — R197 Diarrhea, unspecified: Secondary | ICD-10-CM | POA: Diagnosis not present

## 2015-08-28 DIAGNOSIS — F329 Major depressive disorder, single episode, unspecified: Secondary | ICD-10-CM | POA: Diagnosis not present

## 2015-08-28 DIAGNOSIS — F419 Anxiety disorder, unspecified: Secondary | ICD-10-CM | POA: Insufficient documentation

## 2015-08-28 DIAGNOSIS — Z862 Personal history of diseases of the blood and blood-forming organs and certain disorders involving the immune mechanism: Secondary | ICD-10-CM | POA: Insufficient documentation

## 2015-08-28 DIAGNOSIS — M1990 Unspecified osteoarthritis, unspecified site: Secondary | ICD-10-CM | POA: Diagnosis not present

## 2015-08-28 DIAGNOSIS — L930 Discoid lupus erythematosus: Secondary | ICD-10-CM | POA: Insufficient documentation

## 2015-08-28 DIAGNOSIS — M329 Systemic lupus erythematosus, unspecified: Secondary | ICD-10-CM

## 2015-08-28 DIAGNOSIS — Z79899 Other long term (current) drug therapy: Secondary | ICD-10-CM | POA: Insufficient documentation

## 2015-08-28 DIAGNOSIS — Z3202 Encounter for pregnancy test, result negative: Secondary | ICD-10-CM | POA: Insufficient documentation

## 2015-08-28 HISTORY — DX: Osteonecrosis, unspecified: M87.9

## 2015-08-28 LAB — URINALYSIS, ROUTINE W REFLEX MICROSCOPIC
Bilirubin Urine: NEGATIVE
Glucose, UA: NEGATIVE mg/dL
Hgb urine dipstick: NEGATIVE
KETONES UR: NEGATIVE mg/dL
Leukocytes, UA: NEGATIVE
Nitrite: NEGATIVE
Protein, ur: NEGATIVE mg/dL
Specific Gravity, Urine: 1.006 (ref 1.005–1.030)
UROBILINOGEN UA: 0.2 mg/dL (ref 0.0–1.0)
pH: 8 (ref 5.0–8.0)

## 2015-08-28 LAB — CBC WITH DIFFERENTIAL/PLATELET
BASOS ABS: 0 10*3/uL (ref 0.0–0.1)
Basophils Relative: 0 % (ref 0–1)
EOS PCT: 0 % (ref 0–5)
Eosinophils Absolute: 0 10*3/uL (ref 0.0–0.7)
HCT: 39.5 % (ref 36.0–46.0)
Hemoglobin: 12.2 g/dL (ref 12.0–15.0)
Lymphocytes Relative: 13 % (ref 12–46)
Lymphs Abs: 1.2 10*3/uL (ref 0.7–4.0)
MCH: 29.2 pg (ref 26.0–34.0)
MCHC: 30.9 g/dL (ref 30.0–36.0)
MCV: 94.5 fL (ref 78.0–100.0)
Monocytes Absolute: 0.9 10*3/uL (ref 0.1–1.0)
Monocytes Relative: 10 % (ref 3–12)
Neutro Abs: 6.7 10*3/uL (ref 1.7–7.7)
Neutrophils Relative %: 77 % (ref 43–77)
PLATELETS: 262 10*3/uL (ref 150–400)
RBC: 4.18 MIL/uL (ref 3.87–5.11)
RDW: 13.7 % (ref 11.5–15.5)
WBC: 8.8 10*3/uL (ref 4.0–10.5)

## 2015-08-28 LAB — COMPREHENSIVE METABOLIC PANEL
ALT: 18 U/L (ref 14–54)
ANION GAP: 8 (ref 5–15)
AST: 19 U/L (ref 15–41)
Albumin: 3.6 g/dL (ref 3.5–5.0)
Alkaline Phosphatase: 53 U/L (ref 38–126)
BUN: <5 mg/dL — ABNORMAL LOW (ref 6–20)
CHLORIDE: 103 mmol/L (ref 101–111)
CO2: 29 mmol/L (ref 22–32)
Calcium: 9.4 mg/dL (ref 8.9–10.3)
Creatinine, Ser: 0.78 mg/dL (ref 0.44–1.00)
GFR calc non Af Amer: >60 mL/min (ref >60)
Glucose, Bld: 83 mg/dL (ref 65–99)
Potassium: 3.4 mmol/L — ABNORMAL LOW (ref 3.5–5.1)
Sodium: 140 mmol/L (ref 135–145)
TOTAL PROTEIN: 7.5 g/dL (ref 6.5–8.1)
Total Bilirubin: 0.8 mg/dL (ref 0.3–1.2)

## 2015-08-28 LAB — PREGNANCY, URINE: PREG TEST UR: NEGATIVE

## 2015-08-28 MED ORDER — ONDANSETRON 8 MG PO TBDP: 8.0000 mg | ORAL_TABLET | Freq: Three times a day (TID) | ORAL | Status: DC | PRN

## 2015-08-28 MED ORDER — OXYCODONE-ACETAMINOPHEN 5-325 MG PO TABS: 1.0000 | ORAL_TABLET | Freq: Four times a day (QID) | ORAL | Status: DC | PRN

## 2015-08-28 MED ORDER — SODIUM CHLORIDE 0.9 % IV BOLUS (SEPSIS)
1000.0000 mL | Freq: Once | INTRAVENOUS | Status: AC
  Administered 2015-08-28: 1000 mL via INTRAVENOUS

## 2015-08-28 MED ORDER — DIPHENOXYLATE-ATROPINE 2.5-0.025 MG PO TABS: 1.0000 | ORAL_TABLET | Freq: Four times a day (QID) | ORAL | Status: DC | PRN

## 2015-08-28 MED ORDER — IOHEXOL 300 MG/ML  SOLN
75.0000 mL | Freq: Once | INTRAMUSCULAR | Status: AC | PRN
  Administered 2015-08-28: 75 mL via INTRAVENOUS

## 2015-08-28 MED ORDER — MORPHINE SULFATE (PF) 4 MG/ML IV SOLN
4.0000 mg | Freq: Once | INTRAVENOUS | Status: AC
  Administered 2015-08-28: 4 mg via INTRAVENOUS
  Filled 2015-08-28: qty 1

## 2015-08-28 MED ORDER — MORPHINE SULFATE (PF) 4 MG/ML IV SOLN: 4.0000 mg | Freq: Once | INTRAVENOUS | Status: DC

## 2015-08-28 MED ORDER — HYDROMORPHONE HCL 1 MG/ML IJ SOLN
1.0000 mg | Freq: Once | INTRAMUSCULAR | Status: AC
  Administered 2015-08-28: 1 mg via INTRAVENOUS
  Filled 2015-08-28: qty 1

## 2015-08-28 MED ORDER — ONDANSETRON HCL 4 MG/2ML IJ SOLN
4.0000 mg | Freq: Once | INTRAMUSCULAR | Status: AC
Start: 2015-08-28 — End: 2015-08-28
  Administered 2015-08-28: 4 mg via INTRAVENOUS
  Filled 2015-08-28: qty 2

## 2015-08-28 NOTE — ED Provider Notes (Signed)
CSN: [id number]     Arrival date & time 08/28/15  1351 History   First MD Initiated Contact with Patient 08/28/15 1607     Chief Complaint  Patient presents with  . Diarrhea  . Headache     (Consider location/radiation/quality/duration/timing/severity/associated sxs/prior Treatment) HPI.... Patient complains of multiple bouts of diarrhea since August 7 after trip to the [place]. Diarrhea is nonbloody, non-mucousy. It is described as watery and brown. She has a history of lupus and allegedly goes the [place], but not very often. She additionally has a headache and pain behind her right ear. No fever, sweats, chills. She feels hot and cold. Severity is mild to moderate.  Past Medical History  Diagnosis Date  . Headache(784.0)   . Anxiety   . Anemia   . Depression   . Lupus   . Lupus nephritis   . Pericardial effusion     Intermittent. Associated with lupus.  . Arthritis     rheumatoid  . Osteonecrosis     bilateral legs, worse in right hip.  D/t lupus   Past Surgical History  Procedure Laterality Date  . Dilation and evacuation  11/10  . Laparoscopic tubal ligation  07/10/2011    Procedure: LAPAROSCOPIC TUBAL LIGATION;  Surgeon: Purcell Nails, MD;  Location: [facility];  Service: Gynecology;  Laterality: Bilateral;  fulguration  . Tubal ligation     Family History  Problem Relation Age of Onset  . Hypertension Mother    Social History  Substance Use Topics  . Smoking status: Current Every Day Smoker -- 0.25 packs/day for 7 years    Types: Cigarettes  . Smokeless tobacco: Never Used  . Alcohol Use: 1.8 oz/week    3 Shots of liquor per week   OB History    Gravida Para Term Preterm AB TAB SAB Ectopic Multiple Living   3 1 1  2  2   1      Review of Systems  All other systems reviewed and are negative.     Allergies  Metoclopramide hcl; Alpha blocker quinazolines; Ciprofloxacin; Nsaids; Pork-derived products; Sulfur; and Tramadol  Home  Medications   Prior to Admission medications   Medication Sig Start Date End Date Taking? Authorizing Provider  acetaminophen (TYLENOL) 500 MG tablet Take 1,500 mg by mouth 2 (two) times daily as needed (lupus pain).    Yes Historical Provider, MD  baclofen (LIORESAL) 10 MG tablet Take 10 mg by mouth daily.   Yes Historical Provider, MD  diphenhydrAMINE (BENADRYL) 25 MG tablet Take 25 mg by mouth every 6 (six) hours as needed for allergies (allergies).    Yes Historical Provider, MD  hydroxychloroquine (PLAQUENIL) 200 MG tablet Take 1 tablet (200 mg total) by mouth 2 (two) times daily. For lupus. 09/17/13  Yes 09/19/13 T Mashburn, PA-C  predniSONE (DELTASONE) 5 MG tablet Take 7.5 mg by mouth daily with breakfast. Patient takes a whole 5mg  tablet then spilt the 5mg  (2.5mg )  To make total of 7.5mg  tab. Per patient   Yes Historical Provider, MD  prochlorperazine (COMPAZINE) 10 MG tablet Take 10 mg by mouth 2 (two) times daily as needed for nausea or vomiting (headache).    Yes Historical Provider, MD  venlafaxine XR (EFFEXOR XR) 75 MG 24 hr capsule Take 1 capsule (75 mg total) by mouth daily with breakfast. 09/28/14  Yes , MD  diphenoxylate-atropine (LOMOTIL) 2.5-0.025 MG per tablet Take 1 tablet by mouth 4 (four) times daily as needed for  diarrhea or loose stools. 08/28/15   Donnetta Hutching, MD  fluconazole (DIFLUCAN) 150 MG tablet Take 1 tablet (150 mg total) by mouth once. May repeat in 2 days if still having symptoms. Patient not taking: Reported on 12/10/2014 09/17/14   Armando Reichert, CNM  hydroxychloroquine (PLAQUENIL) 200 MG tablet Take 1.5 tablets (300 mg total) by mouth daily. Patient not taking: Reported on 08/28/2015 09/28/14   Denton Brick, MD  metroNIDAZOLE (FLAGYL) 500 MG tablet Take 1 tablet (500 mg total) by mouth 2 (two) times daily. Patient not taking: Reported on 12/10/2014 09/17/14   Armando Reichert, CNM  nitrofurantoin, macrocrystal-monohydrate, (MACROBID) 100 MG capsule Take 1  capsule (100 mg total) by mouth 2 (two) times daily. Patient not taking: Reported on 12/10/2014 09/17/14   Armando Reichert, CNM  ondansetron (ZOFRAN ODT) 8 MG disintegrating tablet Take 1 tablet (8 mg total) by mouth every 8 (eight) hours as needed for nausea or vomiting. 08/28/15   Donnetta Hutching, MD  oxyCODONE (ROXICODONE) 5 MG immediate release tablet Take 1 tablet (5 mg total) by mouth every 6 (six) hours as needed for severe pain. Patient not taking: Reported on 12/10/2014 12/03/14   Elson Areas, PA-C  oxyCODONE (ROXICODONE) 5 MG immediate release tablet You may take up to 20 mg (4 pills) every 4-6 hours as needed for pain control. Patient not taking: Reported on 08/28/2015 12/11/14   Joni Reining Pisciotta, PA-C  predniSONE (DELTASONE) 10 MG tablet Take 1 tablet (10 mg total) by mouth daily with breakfast. Patient not taking: Reported on 08/28/2015 09/28/14   Denton Brick, MD   BP 107/62 mmHg  Pulse 79  Temp(Src) 97.9 F (36.6 C) (Oral)  Resp 16  Ht 5\' 5"  (1.651 m)  Wt 117 lb (53.071 kg)  BMI 19.47 kg/m2  SpO2 97%  LMP 05/02/2015 Physical Exam  Constitutional: She is oriented to person, place, and time. She appears well-developed and well-nourished.  Well-hydrated  HENT:  Head: Normocephalic and atraumatic.  Bilateral tympanic membranes within normal limits.  Tender to palpation over the right mastoid area  Eyes: Conjunctivae and EOM are normal. Pupils are equal, round, and reactive to light.  Neck: Normal range of motion. Neck supple.  Cardiovascular: Normal rate and regular rhythm.   Pulmonary/Chest: Effort normal and breath sounds normal.  Abdominal: Soft. Bowel sounds are normal.  Musculoskeletal: Normal range of motion.  Neurological: She is alert and oriented to person, place, and time.  Skin: Skin is warm and dry.  Psychiatric: She has a normal mood and affect. Her behavior is normal.  Nursing note and vitals reviewed.   ED Course  Procedures (including critical care  time) Labs Review Labs Reviewed  COMPREHENSIVE METABOLIC PANEL - Abnormal; Notable for the following:    Potassium 3.4 (*)    BUN <5 (*)    All other components within normal limits  CBC WITH DIFFERENTIAL/PLATELET  URINALYSIS, ROUTINE W REFLEX MICROSCOPIC (NOT AT [facility])  PREGNANCY, URINE  GI PATHOGEN PANEL BY PCR, STOOL    Imaging Review Ct Maxillofacial W/cm  08/28/2015   CLINICAL DATA:  Acute onset of generalized headache and weakness. Right parotid pain and arrhythmia. Initial encounter.  EXAM: CT MAXILLOFACIAL WITH CONTRAST  TECHNIQUE: Multidetector CT imaging of the maxillofacial structures was performed with intravenous contrast. Multiplanar CT image reconstructions were also generated. A small metallic BB was placed on the right temple in order to reliably differentiate right from left.  CONTRAST:  18mL OMNIPAQUE IOHEXOL 300 MG/ML  SOLN  COMPARISON:  Maxillofacial CT performed 06/19/2013  FINDINGS: There is no evidence of fracture or dislocation. The maxilla and mandible appear intact. The nasal bone is unremarkable in appearance. The visualized dentition demonstrates no acute abnormality.  The orbits are intact bilaterally. The visualized paranasal sinuses and mastoid air cells are well-aerated.  No significant soft tissue abnormalities are seen. The parapharyngeal fat planes are preserved. The nasopharynx, oropharynx and hypopharynx are unremarkable in appearance. The visualized portions of the valleculae and piriform sinuses are grossly unremarkable.  The parotid and submandibular glands are symmetric and within normal limits. No abnormal contrast enhancement is seen. Visualized nodes within the parotid glands are relatively symmetric. No cervical lymphadenopathy is seen. The visualized portions of the brain are unremarkable in appearance.  IMPRESSION: Unremarkable contrast-enhanced maxillofacial CT. The parotid glands are symmetric and unremarkable in appearance.   Electronically Signed    By: Roanna Raider M.D.   On: 08/28/2015 20:48   I have personally reviewed and evaluated these images and lab results as part of my medical decision-making.   EKG Interpretation None      MDM   Final diagnoses:  Diarrhea  Lupus    Patient is in no acute distress. She feels better after 2 L of IV fluids. CT maxillofacial shows no pathology surrounding the right mastoid area. Vital signs are stable. Discharge medications Zofran 8 mg and Lomotil.    Donnetta Hutching, MD 08/28/15 2141

## 2015-08-28 NOTE — ED Notes (Addendum)
Pt reports vacationed in [place],returned to [place] 08-10-15.  Onset 08-07-15 diarrhea, x 6 per day, watery,brown.  Onset Friday headache and legs weak and shaky- has tried numerous meds and has not been effective.  No fevers, rash or bite marks.

## 2015-08-28 NOTE — ED Notes (Addendum)
Pt reports diffuse body aches, diarrhea and headache that began 08/10/15 after traveling to the [place].  Pt denies any abdominal pain.  Pt reports "I've tried Baclofen, Ibuprofen, Compazine, Benadryl, and hydrocodones nothing is working."

## 2015-08-28 NOTE — Discharge Instructions (Signed)
Medication for nausea and diarrhea. Increase fluids. Follow-up your primary care doctor

## 2016-03-13 ENCOUNTER — Emergency Department (HOSPITAL_COMMUNITY): Payer: Medicaid - Out of State

## 2016-03-13 ENCOUNTER — Emergency Department (HOSPITAL_COMMUNITY)
Admission: EM | Admit: 2016-03-13 | Discharge: 2016-03-13 | Disposition: A | Discharge status: Home / Self Care | Payer: Medicaid - Out of State | Attending: Emergency Medicine | Admitting: Emergency Medicine

## 2016-03-13 ENCOUNTER — Encounter (HOSPITAL_COMMUNITY): Payer: Self-pay | Admitting: Emergency Medicine

## 2016-03-13 DIAGNOSIS — Z862 Personal history of diseases of the blood and blood-forming organs and certain disorders involving the immune mechanism: Secondary | ICD-10-CM | POA: Diagnosis not present

## 2016-03-13 DIAGNOSIS — Z3202 Encounter for pregnancy test, result negative: Secondary | ICD-10-CM | POA: Diagnosis not present

## 2016-03-13 DIAGNOSIS — S8991XA Unspecified injury of right lower leg, initial encounter: Secondary | ICD-10-CM | POA: Diagnosis not present

## 2016-03-13 DIAGNOSIS — M8700 Idiopathic aseptic necrosis of unspecified bone: Secondary | ICD-10-CM

## 2016-03-13 DIAGNOSIS — S79912A Unspecified injury of left hip, initial encounter: Secondary | ICD-10-CM | POA: Insufficient documentation

## 2016-03-13 DIAGNOSIS — W1839XA Other fall on same level, initial encounter: Secondary | ICD-10-CM | POA: Insufficient documentation

## 2016-03-13 DIAGNOSIS — Z8659 Personal history of other mental and behavioral disorders: Secondary | ICD-10-CM | POA: Insufficient documentation

## 2016-03-13 DIAGNOSIS — S8992XA Unspecified injury of left lower leg, initial encounter: Secondary | ICD-10-CM | POA: Insufficient documentation

## 2016-03-13 DIAGNOSIS — M8708 Idiopathic aseptic necrosis of bone, other site: Secondary | ICD-10-CM | POA: Diagnosis not present

## 2016-03-13 DIAGNOSIS — F17210 Nicotine dependence, cigarettes, uncomplicated: Secondary | ICD-10-CM | POA: Insufficient documentation

## 2016-03-13 DIAGNOSIS — Z79899 Other long term (current) drug therapy: Secondary | ICD-10-CM | POA: Diagnosis not present

## 2016-03-13 DIAGNOSIS — M069 Rheumatoid arthritis, unspecified: Secondary | ICD-10-CM | POA: Diagnosis not present

## 2016-03-13 DIAGNOSIS — Y998 Other external cause status: Secondary | ICD-10-CM | POA: Insufficient documentation

## 2016-03-13 DIAGNOSIS — Y9389 Activity, other specified: Secondary | ICD-10-CM | POA: Insufficient documentation

## 2016-03-13 DIAGNOSIS — R52 Pain, unspecified: Secondary | ICD-10-CM

## 2016-03-13 DIAGNOSIS — M25552 Pain in left hip: Secondary | ICD-10-CM

## 2016-03-13 DIAGNOSIS — W19XXXA Unspecified fall, initial encounter: Secondary | ICD-10-CM

## 2016-03-13 DIAGNOSIS — G8929 Other chronic pain: Secondary | ICD-10-CM | POA: Insufficient documentation

## 2016-03-13 DIAGNOSIS — R110 Nausea: Secondary | ICD-10-CM | POA: Insufficient documentation

## 2016-03-13 DIAGNOSIS — Y9289 Other specified places as the place of occurrence of the external cause: Secondary | ICD-10-CM | POA: Diagnosis not present

## 2016-03-13 LAB — COMPREHENSIVE METABOLIC PANEL
ALBUMIN: 4.2 g/dL (ref 3.5–5.0)
ALT: 13 U/L — ABNORMAL LOW (ref 14–54)
ANION GAP: 11 (ref 5–15)
AST: 18 U/L (ref 15–41)
Alkaline Phosphatase: 56 U/L (ref 38–126)
BILIRUBIN TOTAL: 0.8 mg/dL (ref 0.3–1.2)
BUN: 11 mg/dL (ref 6–20)
CO2: 26 mmol/L (ref 22–32)
Calcium: 9.4 mg/dL (ref 8.9–10.3)
Chloride: 107 mmol/L (ref 101–111)
Creatinine, Ser: 0.72 mg/dL (ref 0.44–1.00)
GFR calc Af Amer: >60 mL/min (ref >60)
GFR calc non Af Amer: >60 mL/min (ref >60)
GLUCOSE: 96 mg/dL (ref 65–99)
POTASSIUM: 3.5 mmol/L (ref 3.5–5.1)
SODIUM: 144 mmol/L (ref 135–145)
TOTAL PROTEIN: 8.4 g/dL — AB (ref 6.5–8.1)

## 2016-03-13 LAB — CBC WITH DIFFERENTIAL/PLATELET
BASOS ABS: 0 10*3/uL (ref 0.0–0.1)
BASOS PCT: 0 %
EOS ABS: 0.1 10*3/uL (ref 0.0–0.7)
EOS PCT: 1 %
HCT: 37.1 % (ref 36.0–46.0)
Hemoglobin: 11.5 g/dL — ABNORMAL LOW (ref 12.0–15.0)
Lymphocytes Relative: 19 %
Lymphs Abs: 1 10*3/uL (ref 0.7–4.0)
MCH: 29.6 pg (ref 26.0–34.0)
MCHC: 31 g/dL (ref 30.0–36.0)
MCV: 95.6 fL (ref 78.0–100.0)
MONO ABS: 0.3 10*3/uL (ref 0.1–1.0)
MONOS PCT: 6 %
Neutro Abs: 3.9 10*3/uL (ref 1.7–7.7)
Neutrophils Relative %: 74 %
PLATELETS: 272 10*3/uL (ref 150–400)
RBC: 3.88 MIL/uL (ref 3.87–5.11)
RDW: 14.1 % (ref 11.5–15.5)
WBC: 5.3 10*3/uL (ref 4.0–10.5)

## 2016-03-13 LAB — URINE MICROSCOPIC-ADD ON

## 2016-03-13 LAB — POC URINE PREG, ED: PREG TEST UR: NEGATIVE

## 2016-03-13 LAB — URINALYSIS, ROUTINE W REFLEX MICROSCOPIC
BILIRUBIN URINE: NEGATIVE
Glucose, UA: NEGATIVE mg/dL
Hgb urine dipstick: NEGATIVE
KETONES UR: NEGATIVE mg/dL
NITRITE: POSITIVE — AB
Protein, ur: NEGATIVE mg/dL
SPECIFIC GRAVITY, URINE: 1.013 (ref 1.005–1.030)
pH: 7 (ref 5.0–8.0)

## 2016-03-13 LAB — SEDIMENTATION RATE: Sed Rate: 43 mm/hr — ABNORMAL HIGH (ref 0–22)

## 2016-03-13 LAB — C-REACTIVE PROTEIN: CRP: 0.5 mg/dL (ref <1.0)

## 2016-03-13 MED ORDER — DIAZEPAM 5 MG/ML IJ SOLN
5.0000 mg | Freq: Once | INTRAMUSCULAR | Status: AC
  Administered 2016-03-13: 5 mg via INTRAVENOUS
  Filled 2016-03-13: qty 2

## 2016-03-13 MED ORDER — HYDROMORPHONE HCL 1 MG/ML IJ SOLN
1.0000 mg | Freq: Once | INTRAMUSCULAR | Status: AC
  Administered 2016-03-13: 1 mg via INTRAVENOUS
  Filled 2016-03-13: qty 1

## 2016-03-13 MED ORDER — SODIUM CHLORIDE 0.9 % IV BOLUS (SEPSIS)
1000.0000 mL | Freq: Once | INTRAVENOUS | Status: AC
Start: 2016-03-13 — End: 2016-03-13
  Administered 2016-03-13: 1000 mL via INTRAVENOUS

## 2016-03-13 MED ORDER — DIPHENHYDRAMINE HCL 50 MG/ML IJ SOLN
50.0000 mg | Freq: Once | INTRAMUSCULAR | Status: AC
  Administered 2016-03-13: 50 mg via INTRAVENOUS
  Filled 2016-03-13: qty 1

## 2016-03-13 MED ORDER — METHOCARBAMOL 500 MG PO TABS
1000.0000 mg | ORAL_TABLET | Freq: Once | ORAL | Status: AC
  Administered 2016-03-13: 1000 mg via ORAL
  Filled 2016-03-13: qty 2

## 2016-03-13 MED ORDER — OXYCODONE-ACETAMINOPHEN 5-325 MG PO TABS: 1.0000 | ORAL_TABLET | ORAL | Status: DC | PRN

## 2016-03-13 MED ORDER — ONDANSETRON HCL 4 MG/2ML IJ SOLN
4.0000 mg | Freq: Once | INTRAMUSCULAR | Status: AC
  Administered 2016-03-13: 4 mg via INTRAVENOUS
  Filled 2016-03-13: qty 2

## 2016-03-13 MED ORDER — FENTANYL CITRATE (PF) 100 MCG/2ML IJ SOLN
50.0000 ug | Freq: Once | INTRAMUSCULAR | Status: AC
  Administered 2016-03-13: 50 ug via INTRAVENOUS
  Filled 2016-03-13: qty 2

## 2016-03-13 NOTE — ED Notes (Signed)
Patient has been notified that we are needed urine sample. Patient stated that shes in too much pain right now and will call out when ready later. RN notified.

## 2016-03-13 NOTE — Discharge Instructions (Signed)
You have been seen today for body aches, hip pain, and a fall. Your lab results showed no acute abnormalities, however, your hip x-ray showed an issue that needs to be addressed by the orthopedic surgeon as soon as possible. The orthopedic surgeon has agreed to see you in the office tomorrow morning, Wednesday, March 15. The office opens at 8:30 AM. Call before you go or just show up and the doctor will see you. Follow up with PCP as needed. Return to ED should symptoms worsen.

## 2016-03-13 NOTE — ED Notes (Signed)
[facility], co pain all over x 4 days , Hx lupus. Did not take her meds today. denies nausea or emesis. Alert and oriented x 4. No further symptoms.

## 2016-03-13 NOTE — ED Provider Notes (Signed)
CSN: [id number]     Arrival date & time 03/13/16  0857 History   First MD Initiated Contact with Patient 03/13/16 [phone]     Chief Complaint  Patient presents with  . Lupus     (Consider location/radiation/quality/duration/timing/severity/associated sxs/prior Treatment) HPI   Evelyn [name] is a 35 y.o. female, with a history of lupus, lupus nephritis, osteonecrosis, and anemia, presenting to the ED with overall body aches that began four days ago. Pt states she also fell when her left hip "gave out" this morning. Pt rates her pain at 9/10 and aching in her muscles. Pt states she is chronically in pain, but states it has been worse over the last four days. Pt states she takes 6x 54m tylenol every four hours. Last dose of tylenol was 4 am this morning. Pt also endorses some nausea today and a fever of 102 yesterday. [place] [facility] [place] [facility]. Patient denies chest pain, shortness of breath, abdominal pain, or any other complaints.    Past Medical History  Diagnosis Date  . Headache(784.0)   . Anxiety   . Anemia   . Depression   . [place]   [place]   . Pericardial effusion     Intermittent. Associated with lupus.  . Arthritis     rheumatoid  . Osteonecrosis ([place]     bilateral legs, worse in right hip.  D/t lupus   Past Surgical History  Procedure Laterality Date  . Dilation and evacuation  11/10  . Laparoscopic tubal ligation  07/10/2011    Procedure: LAPAROSCOPIC TUBAL LIGATION;  Surgeon: ADelice Lesch MD;  [facility];  Service: Gynecology;  Laterality: Bilateral;  fulguration  . Tubal ligation     Family History  Problem Relation Age of Onset  . Hypertension Mother    Social History  Substance Use Topics  . Smoking status: Current Every Day Smoker -- 0.25 packs/day for 7 years    Types: Cigarettes  . Smokeless  tobacco: Never Used  . Alcohol Use: 1.8 oz/week    3 Shots of liquor per week   OB History    Gravida Para Term Preterm AB TAB SAB Ectopic Multiple Living   '3 1 1  2  2   1     ' Review of Systems  Constitutional: Negative for fever, chills and diaphoresis.  Respiratory: Negative for shortness of breath.   Cardiovascular: Negative for chest pain.  Gastrointestinal: Positive for nausea. Negative for vomiting and abdominal pain.  Musculoskeletal: Positive for myalgias (Generalized) and arthralgias (Left hip).  Skin: Negative for color change, pallor and rash.  Neurological: Negative for dizziness, syncope, weakness, light-headedness and headaches.  All other systems reviewed and are negative.     Allergies  Alfalfa; Metoclopramide hcl; Alpha blocker quinazolines; Ciprofloxacin; Cyclobenzaprine; Enoxaparin; Garlic; Nsaids; Pork-derived products; Sulfur; and Tramadol  Home Medications   Prior to Admission medications   Medication Sig Start Date End Date Taking? Authorizing Provider  acetaminophen (TYLENOL) 650 MG CR tablet Take 3,900 mg by mouth every 4 (four) hours as needed for pain.    Yes Historical Provider, MD  diphenhydrAMINE (BENADRYL) 25 MG tablet Take 25 mg by mouth every 6 (six) hours as needed for allergies (allergies).    Yes Historical Provider, MD  hydroxychloroquine (PLAQUENIL) 200 MG tablet Take 1 tablet (200 mg total) by mouth  2 (two) times daily. For lupus. 09/17/13  Yes Milta Deiters T Mashburn, PA-C  predniSONE (DELTASONE) 5 MG tablet Take 7.5 mg by mouth daily with breakfast. Patient takes a whole 33m tablet then spilt the 550m(2.74m70m To make total of 7.74mg89mb. Per patient   Yes Historical Provider, MD  prochlorperazine (COMPAZINE) 10 MG tablet Take 10 mg by mouth 2 (two) times daily as needed for nausea or vomiting (headache).    Yes Historical Provider, MD  venlafaxine XR (EFFEXOR XR) 75 MG 24 hr capsule Take 1 capsule (75 mg total) by mouth daily with breakfast. Patient  taking differently: Take 75 mg by mouth daily as needed (anxiety).  09/28/14  Yes DianNorman Herrlich  diphenoxylate-atropine (LOMOTIL) 2.5-0.025 MG per tablet Take 1 tablet by mouth 4 (four) times daily as needed for diarrhea or loose stools. 08/28/15   BriaNat Christen  oxyCODONE-acetaminophen (PERCOCET/ROXICET) 5-325 MG tablet Take 1-2 tablets by mouth every 4 (four) hours as needed for severe pain. 03/13/16   Shawn C Joy, PA-C   BP 108/68 mmHg  Pulse 86  Temp(Src) 98.5 F (36.9 C) (Oral)  Resp 20  SpO2 100%  LMP  Physical Exam  Constitutional: She is oriented to person, place, and time. She appears well-developed and well-nourished. No distress.  HENT:  Head: Normocephalic and atraumatic.  Eyes: Conjunctivae and EOM are normal. Pupils are equal, round, and reactive to light.  Neck: Normal range of motion. Neck supple.  Cardiovascular: Normal rate, regular rhythm, normal heart sounds and intact distal pulses.   Pulmonary/Chest: Effort normal and breath sounds normal. No respiratory distress.  Abdominal: Soft. Bowel sounds are normal. There is no tenderness. There is no guarding.  Musculoskeletal: She exhibits no edema or tenderness.  Active ROM limited by pain in the lower extremities. Full ROM in upper extremities. No paraspinal tenderness.   Lymphadenopathy:    She has no cervical adenopathy.  Neurological: She is alert and oriented to person, place, and time. She has normal reflexes.  No sensory deficits. Strength 5/5 in all extremities. Gait testing deferred. Coordination intact. Cranial nerves III-XII grossly intact. No facial droop.   Skin: Skin is warm and dry. She is not diaphoretic.  Psychiatric: She has a normal mood and affect. Her behavior is normal.  Nursing note and vitals reviewed.   ED Course  Procedures (including critical care time) Labs Review Labs Reviewed  CBC WITH DIFFERENTIAL/PLATELET - Abnormal; Notable for the following:    Hemoglobin 11.5 (*)    All other  components within normal limits  COMPREHENSIVE METABOLIC PANEL - Abnormal; Notable for the following:    Total Protein 8.4 (*)    ALT 13 (*)    All other components within normal limits  SEDIMENTATION RATE - Abnormal; Notable for the following:    Sed Rate 43 (*)    All other components within normal limits  URINALYSIS, ROUTINE W REFLEX MICROSCOPIC (NOT [facility]; Notable for the following:    APPearance TURBID (*)    Nitrite POSITIVE (*)    Leukocytes, UA TRACE (*)    All other components within normal limits  URINE MICROSCOPIC-ADD ON - Abnormal; Notable for the following:    Squamous Epithelial / LPF 6-30 (*)    Bacteria, UA MANY (*)    All other components within normal limits  C-REACTIVE PROTEIN  POC URINE PREG, ED    Imaging Review Dg Chest 2 View  03/13/2016  CLINICAL DATA:  Pt states pain  all over x 4 days. Hx lupus. Left hip pain today. Hx hip necrosis per pt. Pt fell today. Mid chest pain and weakness. EXAM: CHEST  2 VIEW COMPARISON:  04/26/2014 FINDINGS: Convex left thoracic spine curvature. Midline trachea. Normal heart size and mediastinal contours. No pleural effusion or pneumothorax. Clear lungs. IMPRESSION: No active cardiopulmonary disease. Electronically Signed   By: Abigail Miyamoto M.D.   On: 03/13/2016 12:37   Dg Hip Unilat With Pelvis 2-3 Views Left  03/13/2016  CLINICAL DATA:  Left hip pain today.  Fell.  History of lupus. EXAM: DG HIP (WITH OR WITHOUT PELVIS) 2-3V LEFT COMPARISON:  CT scan from 2012 FINDINGS: Both hips are normally located. Changes of chronic AVN involving the right hip with subchondral lucency and secondary advanced degenerative changes for age. Evidence of prior right hip surgery with probable prior decompression. The left hip also demonstrates a focus of AVN but no head collapse or flattening. Mild degenerative joint disease. No acute fracture. The pubic symphysis and SI joints are intact. No pelvic fractures or bone lesions. 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 Small focus of AVN suspected involving the left femoral head but no fracture. Intact bony pelvis. Electronically Signed   By: Marijo Sanes M.D.   On: 03/13/2016 12:39   I have personally reviewed and evaluated these images and lab results as part of my medical decision-making.   EKG Interpretation None      MDM   Final diagnoses:  Body aches  Hip pain, left  Fall, initial encounter  AVN (avascular necrosis of bone) (HCC)    Evelyn [name] presents with overall body aches associated with her lupus for the past 4 days.  Findings and plan of care discussed with Julianne Rice, MD.  This patient has the potential for possibly having an osseous injury to her left hip due to her past medical history and her complaints. Her pain seems to be out of proportion with the exam. 10:23 AM Upon reassessment, patient states that her pain has not improved. Requested further pain management. Patient's ESR is elevated, but is probably chronically elevated. ESR is also not elevated to a critical level. 11:08 AM Patient states that her chest and abdomen began to hurt following the administration of the Valium. Breathing status does not appear to be compromised. No hives and no angioedema. Benadryl was ordered. 12:00 PM Pt states she can take Dilaudid without exacerbation of her lupus. Patient adds that her abdominal pain and nausea has resolved. Patient's x-ray shows an area of possibly new AVN in the left femoral head. Orthopedic consult is indicated. 12:53 PM Spoke with Dr. Percell Miller, states that the left hip has some acute collapse. An MRI would be low yield for this issue. Recommends discharge, if otherwise safe to do so, with pain medications, a walker or crutches, and instructions to follow-up with Dr. Percell Miller in the office tomorrow morning. Pt can call or show up  tomorrow morningto the office and Dr. Percell Miller  will do a cortisone hip injection. Eventually the patient will need a hip replacement. Originally suggested that the patient receive an anti-inflammatory medication, such as Celebrex, but I informed him that the patient was unable to take these due to her lupus nephritis and the report that NSAIDs exacerbate her lupus symptoms. These recommendations were discussed with the patient. Patient states that she has both a walker and crutches at home and has assistance to get to and from  home in the form of a family member at the bedside. Patient was understanding of her instructions as well as her return precautions and is comfortable with discharge.  Filed Vitals:   03/13/16 0859 03/13/16 1211  BP: 100/63 108/68  Pulse: 88 86  Temp: 98.4 F (36.9 C) 98.5 F (36.9 C)  TempSrc: Oral Oral  Resp: 18 20  SpO2: 100% 100%     Lorayne Bender, PA-C 03/13/16 1401  Julianne Rice, MD 03/15/16 [phone]

## 2016-03-25 ENCOUNTER — Encounter (HOSPITAL_COMMUNITY): Payer: Self-pay | Admitting: Oncology

## 2016-03-25 ENCOUNTER — Emergency Department (HOSPITAL_COMMUNITY): Payer: Medicaid - Out of State

## 2016-03-25 ENCOUNTER — Emergency Department (HOSPITAL_COMMUNITY)
Admission: EM | Admit: 2016-03-25 | Discharge: 2016-03-25 | Disposition: A | Discharge status: Home / Self Care | Payer: Medicaid - Out of State | Attending: Emergency Medicine | Admitting: Emergency Medicine

## 2016-03-25 DIAGNOSIS — R51 Headache: Secondary | ICD-10-CM | POA: Diagnosis not present

## 2016-03-25 DIAGNOSIS — F17210 Nicotine dependence, cigarettes, uncomplicated: Secondary | ICD-10-CM | POA: Insufficient documentation

## 2016-03-25 DIAGNOSIS — G8929 Other chronic pain: Secondary | ICD-10-CM | POA: Diagnosis not present

## 2016-03-25 DIAGNOSIS — R21 Rash and other nonspecific skin eruption: Secondary | ICD-10-CM | POA: Diagnosis not present

## 2016-03-25 DIAGNOSIS — F419 Anxiety disorder, unspecified: Secondary | ICD-10-CM | POA: Insufficient documentation

## 2016-03-25 DIAGNOSIS — M25551 Pain in right hip: Secondary | ICD-10-CM | POA: Diagnosis not present

## 2016-03-25 DIAGNOSIS — R112 Nausea with vomiting, unspecified: Secondary | ICD-10-CM | POA: Diagnosis present

## 2016-03-25 DIAGNOSIS — Z8679 Personal history of other diseases of the circulatory system: Secondary | ICD-10-CM | POA: Insufficient documentation

## 2016-03-25 DIAGNOSIS — F329 Major depressive disorder, single episode, unspecified: Secondary | ICD-10-CM | POA: Diagnosis not present

## 2016-03-25 DIAGNOSIS — M069 Rheumatoid arthritis, unspecified: Secondary | ICD-10-CM | POA: Insufficient documentation

## 2016-03-25 DIAGNOSIS — N390 Urinary tract infection, site not specified: Secondary | ICD-10-CM

## 2016-03-25 DIAGNOSIS — Z3202 Encounter for pregnancy test, result negative: Secondary | ICD-10-CM | POA: Insufficient documentation

## 2016-03-25 DIAGNOSIS — Z862 Personal history of diseases of the blood and blood-forming organs and certain disorders involving the immune mechanism: Secondary | ICD-10-CM | POA: Diagnosis not present

## 2016-03-25 DIAGNOSIS — M329 Systemic lupus erythematosus, unspecified: Secondary | ICD-10-CM | POA: Diagnosis not present

## 2016-03-25 LAB — POC URINE PREG, ED: PREG TEST UR: NEGATIVE

## 2016-03-25 LAB — CBC WITH DIFFERENTIAL/PLATELET
BASOS ABS: 0 10*3/uL (ref 0.0–0.1)
Basophils Relative: 1 %
Eosinophils Absolute: 0.1 10*3/uL (ref 0.0–0.7)
Eosinophils Relative: 2 %
HCT: 34.3 % — ABNORMAL LOW (ref 36.0–46.0)
HEMOGLOBIN: 10.9 g/dL — AB (ref 12.0–15.0)
LYMPHS PCT: 15 %
Lymphs Abs: 0.6 10*3/uL — ABNORMAL LOW (ref 0.7–4.0)
MCH: 29 pg (ref 26.0–34.0)
MCHC: 31.8 g/dL (ref 30.0–36.0)
MCV: 91.2 fL (ref 78.0–100.0)
MONOS PCT: 7 %
Monocytes Absolute: 0.3 10*3/uL (ref 0.1–1.0)
NEUTROS PCT: 75 %
Neutro Abs: 3.3 10*3/uL (ref 1.7–7.7)
Platelets: 264 10*3/uL (ref 150–400)
RBC: 3.76 MIL/uL — AB (ref 3.87–5.11)
RDW: 13.8 % (ref 11.5–15.5)
WBC: 4.3 10*3/uL (ref 4.0–10.5)

## 2016-03-25 LAB — URINALYSIS, ROUTINE W REFLEX MICROSCOPIC
BILIRUBIN URINE: NEGATIVE
Glucose, UA: NEGATIVE mg/dL
HGB URINE DIPSTICK: NEGATIVE
Ketones, ur: 40 mg/dL — AB
Leukocytes, UA: NEGATIVE
NITRITE: POSITIVE — AB
PROTEIN: 100 mg/dL — AB
Specific Gravity, Urine: 1.024 (ref 1.005–1.030)
pH: 7 (ref 5.0–8.0)

## 2016-03-25 LAB — URINE MICROSCOPIC-ADD ON

## 2016-03-25 LAB — COMPREHENSIVE METABOLIC PANEL
ALBUMIN: 4.3 g/dL (ref 3.5–5.0)
ALK PHOS: 55 U/L (ref 38–126)
ALT: 22 U/L (ref 14–54)
ANION GAP: 11 (ref 5–15)
AST: 33 U/L (ref 15–41)
BUN: 10 mg/dL (ref 6–20)
CALCIUM: 9.2 mg/dL (ref 8.9–10.3)
CHLORIDE: 106 mmol/L (ref 101–111)
CO2: 24 mmol/L (ref 22–32)
CREATININE: 0.59 mg/dL (ref 0.44–1.00)
GFR calc Af Amer: >60 mL/min (ref >60)
GFR calc non Af Amer: >60 mL/min (ref >60)
GLUCOSE: 82 mg/dL (ref 65–99)
Potassium: 3.7 mmol/L (ref 3.5–5.1)
SODIUM: 141 mmol/L (ref 135–145)
Total Bilirubin: 0.6 mg/dL (ref 0.3–1.2)
Total Protein: 8.5 g/dL — ABNORMAL HIGH (ref 6.5–8.1)

## 2016-03-25 LAB — LIPASE, BLOOD: LIPASE: 32 U/L (ref 11–51)

## 2016-03-25 MED ORDER — HYDROMORPHONE HCL 1 MG/ML IJ SOLN
1.0000 mg | Freq: Once | INTRAMUSCULAR | Status: AC
Start: 2016-03-25 — End: 2016-03-25
  Administered 2016-03-25: 1 mg via INTRAVENOUS
  Filled 2016-03-25: qty 1

## 2016-03-25 MED ORDER — DEXAMETHASONE SODIUM PHOSPHATE 10 MG/ML IJ SOLN
10.0000 mg | Freq: Once | INTRAMUSCULAR | Status: AC
  Administered 2016-03-25: 10 mg via INTRAVENOUS
  Filled 2016-03-25: qty 1

## 2016-03-25 MED ORDER — PROCHLORPERAZINE EDISYLATE 5 MG/ML IJ SOLN
10.0000 mg | Freq: Once | INTRAMUSCULAR | Status: AC
  Administered 2016-03-25: 10 mg via INTRAVENOUS
  Filled 2016-03-25: qty 2

## 2016-03-25 MED ORDER — OXYCODONE-ACETAMINOPHEN 5-325 MG PO TABS: 1.0000 | ORAL_TABLET | ORAL | Status: DC | PRN

## 2016-03-25 MED ORDER — PREDNISONE 20 MG PO TABS: 40.0000 mg | ORAL_TABLET | Freq: Every day | ORAL | Status: DC

## 2016-03-25 MED ORDER — SODIUM CHLORIDE 0.9 % IV BOLUS (SEPSIS)
1000.0000 mL | Freq: Once | INTRAVENOUS | Status: AC
  Administered 2016-03-25: 1000 mL via INTRAVENOUS

## 2016-03-25 MED ORDER — DIPHENHYDRAMINE HCL 50 MG/ML IJ SOLN
12.5000 mg | Freq: Once | INTRAMUSCULAR | Status: AC
  Administered 2016-03-25: 12.5 mg via INTRAVENOUS
  Filled 2016-03-25: qty 1

## 2016-03-25 MED ORDER — CEPHALEXIN 500 MG PO CAPS: 500.0000 mg | ORAL_CAPSULE | Freq: Four times a day (QID) | ORAL | Status: DC

## 2016-03-25 MED ORDER — DIPHENHYDRAMINE HCL 25 MG PO TABS: 25.0000 mg | ORAL_TABLET | Freq: Four times a day (QID) | ORAL | Status: DC | PRN

## 2016-03-25 NOTE — ED Provider Notes (Signed)
CSN: [id number]     Arrival date & time 03/25/16  0258 History   First MD Initiated Contact with Patient 03/25/16 [phone]     Chief Complaint  Patient presents with  . Multiple c/o     HPI   Evelyn [name] is a 35 y.o. female with a PMH of lupus, osteonecrosis of bilateral hips, headaches who presents to the ED with rash to her head, ears, and chest, bilateral hip pain, and nausea. She states her symptoms started Thursday and have been constant since that time. She notes her rash itches. She denies exacerbating or alleviating factors. She reports she fell earlier this week, and landed on her right hip. She notes chronic hip pain at baseline, though states her pain worsened in her right hip after falling. She denies numbness, weakness, paresthesia, LOC, hitting her head. She also reports nausea, though denies abdominal pain. She states she had 1 episode of emesis after eating chicken nuggets last night. She denies diarrhea, constipation. Additionally, she reports headache since yesterday. She notes she has a history of headaches and that her symptoms feel similar. She denies dizziness, lightheadedness, vision changes.   Past Medical History  Diagnosis Date  . Headache(784.0)   . Anxiety   . Anemia   . Depression   . Lupus (HCC)   . Lupus nephritis (HCC)   . Pericardial effusion     Intermittent. Associated with lupus.  . Arthritis     rheumatoid  . Osteonecrosis (HCC)     bilateral legs, worse in right hip.  D/t lupus   Past Surgical History  Procedure Laterality Date  . Dilation and evacuation  11/10  . Laparoscopic tubal ligation  07/10/2011    Procedure: LAPAROSCOPIC TUBAL LIGATION;  Surgeon: Purcell Nails, MD;  Location: [facility];  Service: Gynecology;  Laterality: Bilateral;  fulguration  . Tubal ligation     Family History  Problem Relation Age of Onset  . Hypertension Mother    Social History  Substance Use Topics  . Smoking status: Current Every Day Smoker -- 0.25  packs/day for 7 years    Types: Cigarettes  . Smokeless tobacco: Never Used  . Alcohol Use: 1.8 oz/week    3 Shots of liquor per week   OB History    Gravida Para Term Preterm AB TAB SAB Ectopic Multiple Living   3 1 1  2  2   1       Review of Systems  Constitutional: Negative for fever and chills.  Respiratory: Negative for shortness of breath.   Cardiovascular: Negative for chest pain.  Gastrointestinal: Positive for nausea and vomiting. Negative for abdominal pain, diarrhea and constipation.  Genitourinary: Positive for urgency. Negative for dysuria and frequency.  Musculoskeletal: Positive for arthralgias.  Neurological: Positive for headaches. Negative for dizziness, syncope, weakness, light-headedness and numbness.      Allergies  Alfalfa; Metoclopramide hcl; Alpha blocker quinazolines; Ciprofloxacin; Cyclobenzaprine; Enoxaparin; Garlic; Nsaids; Pork-derived products; Sulfur; and Tramadol  Home Medications   Prior to Admission medications   Medication Sig Start Date End Date Taking? Authorizing Provider  acetaminophen (TYLENOL) 650 MG CR tablet Take 1,950 mg by mouth every 4 (four) hours as needed for pain.    Yes Historical Provider, MD  hydroxychloroquine (PLAQUENIL) 200 MG tablet Take 1 tablet (200 mg total) by mouth 2 (two) times daily. For lupus. 09/17/13  Yes 09/19/13 T Mashburn, PA-C  oxymorphone (OPANA ER) 10 MG 12 hr tablet Take 20 mg by mouth every 12 (twelve)  hours.   Yes Historical Provider, MD  prochlorperazine (COMPAZINE) 10 MG tablet Take 10 mg by mouth 2 (two) times daily as needed for nausea or vomiting (headache).    Yes Historical Provider, MD  venlafaxine XR (EFFEXOR XR) 75 MG 24 hr capsule Take 1 capsule (75 mg total) by mouth daily with breakfast. Patient taking differently: Take 75 mg by mouth daily as needed (anxiety).  09/28/14  Yes Denton Brick, MD  cephALEXin (KEFLEX) 500 MG capsule Take 1 capsule (500 mg total) by mouth 4 (four) times daily. 03/25/16    Mady Gemma, PA-C  diphenhydrAMINE (BENADRYL) 25 MG tablet Take 1 tablet (25 mg total) by mouth every 6 (six) hours as needed for itching. 03/25/16   Mady Gemma, PA-C  diphenoxylate-atropine (LOMOTIL) 2.5-0.025 MG per tablet Take 1 tablet by mouth 4 (four) times daily as needed for diarrhea or loose stools. Patient not taking: Reported on 03/25/2016 08/28/15   Donnetta Hutching, MD  oxyCODONE-acetaminophen (PERCOCET/ROXICET) 5-325 MG tablet Take 1 tablet by mouth every 4 (four) hours as needed for severe pain. 03/25/16   Mady Gemma, PA-C  predniSONE (DELTASONE) 20 MG tablet Take 2 tablets (40 mg total) by mouth daily. 03/25/16   Mady Gemma, PA-C    BP 108/74 mmHg  Pulse 77  Temp(Src) 98.2 F (36.8 C) (Oral)  Resp 18  Ht 5\' 5"  (1.651 m)  Wt 57.607 kg  BMI 21.13 kg/m2  SpO2 100%  LMP  Physical Exam  Constitutional: She is oriented to person, place, and time. She appears well-developed and well-nourished. No distress.  HENT:  Head: Normocephalic and atraumatic.  Right Ear: External ear normal.  Left Ear: External ear normal.  Nose: Nose normal.  Mouth/Throat: Uvula is midline, oropharynx is clear and moist and mucous membranes are normal.  Eyes: Conjunctivae, EOM and lids are normal. Pupils are equal, round, and reactive to light. Right eye exhibits no discharge. Left eye exhibits no discharge. No scleral icterus.  Neck: Normal range of motion. Neck supple.  Cardiovascular: Normal rate, regular rhythm, normal heart sounds, intact distal pulses and normal pulses.   Pulmonary/Chest: Effort normal and breath sounds normal. No respiratory distress. She has no wheezes. She has no rales.  Abdominal: Soft. Normal appearance and bowel sounds are normal. She exhibits no distension and no mass. There is no tenderness. There is no rigidity, no rebound and no guarding.  Musculoskeletal: Normal range of motion. She exhibits tenderness. She exhibits no edema.  TTP to right  lateral hip. Pain with ROM.  Neurological: She is alert and oriented to person, place, and time. She has normal strength. No cranial nerve deficit or sensory deficit.  Skin: Skin is warm, dry and intact. Rash noted. She is not diaphoretic. No erythema. No pallor.  Plaque-like rash to hairline, posterior aspect of ears, and chest. No erythema, edema, or discharge.  Psychiatric: She has a normal mood and affect. Her speech is normal and behavior is normal.  Nursing note and vitals reviewed.   ED Course  Procedures (including critical care time)  Labs Review Labs Reviewed  CBC WITH DIFFERENTIAL/PLATELET - Abnormal; Notable for the following:    RBC 3.76 (*)    Hemoglobin 10.9 (*)    HCT 34.3 (*)    Lymphs Abs 0.6 (*)    All other components within normal limits  COMPREHENSIVE METABOLIC PANEL - Abnormal; Notable for the following:    Total Protein 8.5 (*)    All other components within  normal limits  URINALYSIS, ROUTINE W REFLEX MICROSCOPIC (NOT AT [facility]) - Abnormal; Notable for the following:    APPearance TURBID (*)    Ketones, ur 40 (*)    Protein, ur 100 (*)    Nitrite POSITIVE (*)    All other components within normal limits  URINE MICROSCOPIC-ADD ON - Abnormal; Notable for the following:    Squamous Epithelial / LPF 0-5 (*)    Bacteria, UA MANY (*)    All other components within normal limits  URINE CULTURE  LIPASE, BLOOD  POC URINE PREG, ED    Imaging Review Dg Hip Unilat With Pelvis 2-3 Views Right  03/25/2016  CLINICAL DATA:  Patient complains of chronic right hip pain, worsened s/p fall x 1 day. Hx of osteonecrosis of bilateral hips. Patient is scheduled to have right hip replaced in June 2017. EXAM: DG HIP (WITH OR WITHOUT PELVIS) 2-3V RIGHT COMPARISON:  None. FINDINGS: Hips are located No evidence of RIGHT femoral neck fracture on dedicated view. There is bilateral sclerosis in the femoral heads. There is subchondral collapse of the RIGHT humeral head. IMPRESSION:  Bilateral osteonecrosis of the femoral heads, more severe on the RIGHT. No acute findings. Electronically Signed   By: Genevive Bi M.D.   On: 03/25/2016 07:35   I have personally reviewed and evaluated these images and lab results as part of my medical decision-making.   EKG Interpretation None      MDM   Final diagnoses:  Rash  UTI (lower urinary tract infection)  Right hip pain    35 year old female presents with several complaints. Notes rash to her head, ears, and chest, increased right hip pain s/p fall, and nausea. Also reports headache, which she states is consistent with her history of headaches. Patient is afebrile. Vital signs stable. Plaque-like rash to hairline, posterior ears, and chest. No erythema, edema, or discharge. No evidence of infection. Abdomen soft, non-tender, non-distended. No rebound, guarding, or masses. TTP to right lateral hip. Pain with ROM. Patient is NVI. Normal neuro exam with no focal deficit.   Patient given fluids, migraine medicine, steroid, pain medicine.  CBC negative for leukocytosis, hemoglobin 10.9, which appears chronic. CMP unremarkable. Lipase within normal limits. Urine pregnancy negative. UA with positive nitrites, many bacteria, 0-5 WBC. Urine culture ordered. Imaging of right hip negative for acute findings.  On reassessment of patient, she reports significant symptom improvement. She notes her pain is improved. She denies nausea or headache. Given that the patient reports urgency, will treat for UTI with keflex. Will give prednisone and benadryl for rash and short course of pain medication for hip pain. Patient is non-toxic and well-appearing, feel she is stable for discharge at this time. Patient to follow-up with PCP. Strict return precautions discussed. Patient verbalizes her understanding and is in agreement with plan.  BP 108/74 mmHg  Pulse 77  Temp(Src) 98.2 F (36.8 C) (Oral)  Resp 18  Ht 5\' 5"  (1.651 m)  Wt 57.607 kg  BMI  21.13 kg/m2  SpO2 100%  LMP      Mady Gemma, PA-C 03/25/16 1043  Devoria Albe, MD 03/28/16 [phone]

## 2016-03-25 NOTE — ED Notes (Signed)
Two unsuccessful IV attempts by this writer.  

## 2016-03-25 NOTE — Discharge Instructions (Signed)
1. Medications: prednisone, benadryl, pain medicine, keflex, usual home medications 2. Treatment: rest, drink plenty of fluids 3. Follow Up: please followup with your primary doctor in 2-3 days for discussion of your diagnoses and further evaluation after today's visit; if you do not have a primary care doctor use the phone number listed in your discharge paperwork to find one; please return to the ER for new or worsening symptoms   Urinary Tract Infection A urinary tract infection (UTI) can occur any place along the urinary tract. The tract includes the kidneys, ureters, bladder, and urethra. A type of germ called bacteria often causes a UTI. UTIs are often helped with antibiotic medicine.  HOME CARE   If given, take antibiotics as told by your doctor. Finish them even if you start to feel better.  Drink enough fluids to keep your pee (urine) clear or pale yellow.  Avoid tea, drinks with caffeine, and bubbly (carbonated) drinks.  Pee often. Avoid holding your pee in for a long time.  Pee before and after having sex (intercourse).  Wipe from front to back after you poop (bowel movement) if you are a woman. Use each tissue only once. GET HELP RIGHT AWAY IF:   You have back pain.  You have lower belly (abdominal) pain.  You have chills.  You feel sick to your stomach (nauseous).  You throw up (vomit).  Your burning or discomfort with peeing does not go away.  You have a fever.  Your symptoms are not better in 3 days. MAKE SURE YOU:   Understand these instructions.  Will watch your condition.  Will get help right away if you are not doing well or get worse.   This information is not intended to replace advice given to you by your health care provider. Make sure you discuss any questions you have with your health care provider.   Document Released: 06/04/2008 Document Revised: 01/07/2015 Document Reviewed: 07/17/2012 Elsevier Interactive Patient Education [employer].

## 2016-03-25 NOTE — ED Notes (Signed)
Pt c/o rash to her chest, ears, head.  Pt also c/o b/l leg pain.  Hx of lupus.  +nausea.  Pt took 20 mg of oxycodone at 2300 last night.  Pt also took benadryl, compazine and tylenol also at 2300 w/o relief of sx.

## 2016-03-27 LAB — URINE CULTURE

## 2016-03-28 ENCOUNTER — Telehealth (HOSPITAL_BASED_OUTPATIENT_CLINIC_OR_DEPARTMENT_OTHER): Payer: Self-pay

## 2016-03-28 NOTE — Telephone Encounter (Signed)
Post ED Visit - Positive Culture Follow-up  Culture report reviewed by antimicrobial stewardship pharmacist:  []  , Pharm.D. []  Enzo Bi, .D., BCPS []  Celedonio Miyamoto, Pharm.D. []  [address], Pharm.D., BCPS []  Kep'el, Garvin Fila.D., BCPS, AAHIVP []  , Pharm.D., BCPS, AAHIVP []  Cassie Stewart, Pharm.D. []  Rob Georgina Pillion, .D. Magan Decker Pharm D  Positive urine culture Treated with Cephalexin, organism sensitive to the same and no further patient follow-up is required at this time.  [place] 03/28/2016, 10:36 AM

## 2016-04-13 ENCOUNTER — Encounter (HOSPITAL_COMMUNITY): Payer: Self-pay

## 2016-04-13 ENCOUNTER — Emergency Department (HOSPITAL_COMMUNITY): Payer: Medicaid - Out of State

## 2016-04-13 ENCOUNTER — Emergency Department (HOSPITAL_COMMUNITY)
Admission: EM | Admit: 2016-04-13 | Discharge: 2016-04-13 | Disposition: A | Discharge status: Home / Self Care | Payer: Medicaid - Out of State | Attending: Emergency Medicine | Admitting: Emergency Medicine

## 2016-04-13 DIAGNOSIS — F17210 Nicotine dependence, cigarettes, uncomplicated: Secondary | ICD-10-CM | POA: Diagnosis not present

## 2016-04-13 DIAGNOSIS — Z79899 Other long term (current) drug therapy: Secondary | ICD-10-CM | POA: Diagnosis not present

## 2016-04-13 DIAGNOSIS — Z862 Personal history of diseases of the blood and blood-forming organs and certain disorders involving the immune mechanism: Secondary | ICD-10-CM | POA: Insufficient documentation

## 2016-04-13 DIAGNOSIS — Y9389 Activity, other specified: Secondary | ICD-10-CM | POA: Diagnosis not present

## 2016-04-13 DIAGNOSIS — Y998 Other external cause status: Secondary | ICD-10-CM | POA: Insufficient documentation

## 2016-04-13 DIAGNOSIS — M533 Sacrococcygeal disorders, not elsewhere classified: Secondary | ICD-10-CM

## 2016-04-13 DIAGNOSIS — Z3202 Encounter for pregnancy test, result negative: Secondary | ICD-10-CM | POA: Insufficient documentation

## 2016-04-13 DIAGNOSIS — W1839XA Other fall on same level, initial encounter: Secondary | ICD-10-CM | POA: Diagnosis not present

## 2016-04-13 DIAGNOSIS — Z8659 Personal history of other mental and behavioral disorders: Secondary | ICD-10-CM | POA: Diagnosis not present

## 2016-04-13 DIAGNOSIS — S79912A Unspecified injury of left hip, initial encounter: Secondary | ICD-10-CM | POA: Diagnosis not present

## 2016-04-13 DIAGNOSIS — M1990 Unspecified osteoarthritis, unspecified site: Secondary | ICD-10-CM | POA: Insufficient documentation

## 2016-04-13 DIAGNOSIS — S3992XA Unspecified injury of lower back, initial encounter: Secondary | ICD-10-CM | POA: Diagnosis not present

## 2016-04-13 DIAGNOSIS — Z7952 Long term (current) use of systemic steroids: Secondary | ICD-10-CM | POA: Diagnosis not present

## 2016-04-13 DIAGNOSIS — M25552 Pain in left hip: Secondary | ICD-10-CM

## 2016-04-13 DIAGNOSIS — Y9289 Other specified places as the place of occurrence of the external cause: Secondary | ICD-10-CM | POA: Insufficient documentation

## 2016-04-13 DIAGNOSIS — W19XXXA Unspecified fall, initial encounter: Secondary | ICD-10-CM

## 2016-04-13 LAB — POC URINE PREG, ED: PREG TEST UR: NEGATIVE

## 2016-04-13 MED ORDER — OXYCODONE-ACETAMINOPHEN 5-325 MG PO TABS: 1.0000 | ORAL_TABLET | Freq: Four times a day (QID) | ORAL | Status: DC | PRN

## 2016-04-13 MED ORDER — OXYCODONE-ACETAMINOPHEN 5-325 MG PO TABS
2.0000 | ORAL_TABLET | Freq: Once | ORAL | Status: AC
  Administered 2016-04-13: 2 via ORAL
  Filled 2016-04-13: qty 2

## 2016-04-13 MED ORDER — HYDROMORPHONE HCL 1 MG/ML IJ SOLN
1.0000 mg | Freq: Once | INTRAMUSCULAR | Status: AC
Start: 2016-04-13 — End: 2016-04-13
  Administered 2016-04-13: 1 mg via INTRAMUSCULAR
  Filled 2016-04-13: qty 1

## 2016-04-13 NOTE — Discharge Instructions (Signed)
Hip Pain Your hip is the joint between your upper legs and your lower pelvis. The bones, cartilage, tendons, and muscles of your hip joint perform a lot of work each day supporting your body weight and allowing you to move around. Hip pain can range from a minor ache to severe pain in one or both of your hips. Pain may be felt on the inside of the hip joint near the groin, or the outside near the buttocks and upper thigh. You may have swelling or stiffness as well.  HOME CARE INSTRUCTIONS   Take medicines only as directed by your health care provider.  Apply ice to the injured area:  Put ice in a plastic bag.  Place a towel between your skin and the bag.  Leave the ice on for 15-20 minutes at a time, 3-4 times a day.  Keep your leg raised (elevated) when possible to lessen swelling.  Avoid activities that cause pain.  Follow specific exercises as directed by your health care provider.  Sleep with a pillow between your legs on your most comfortable side.  Record how often you have hip pain, the location of the pain, and what it feels like. SEEK MEDICAL CARE IF:   You are unable to put weight on your leg.  Your hip is red or swollen or very tender to touch.  Your pain or swelling continues or worsens after 1 week.  You have increasing difficulty walking.  You have a fever. SEEK IMMEDIATE MEDICAL CARE IF:   You have fallen.  You have a sudden increase in pain and swelling in your hip. MAKE SURE YOU:   Understand these instructions.  Will watch your condition.  Will get help right away if you are not doing well or get worse.   This information is not intended to replace advice given to you by your health care provider. Make sure you discuss any questions you have with your health care provider.   Document Released: 06/06/2010 Document Revised: 01/07/2015 Document Reviewed: 08/13/2013 Elsevier Interactive Patient Education 2016 [place] Pain Joint pain,  which is also called arthralgia, can be caused by many things. Joint pain often goes away when you follow your health care provider's instructions for relieving pain at home. However, joint pain can also be caused by conditions that require further treatment. Common causes of joint pain include:  Bruising in the area of the joint.  Overuse of the joint.  Wear and tear on the joints that occur with aging (osteoarthritis).  Various other forms of arthritis.  A buildup of a crystal form of uric acid in the joint (gout).  Infections of the joint (septic arthritis) or of the bone (osteomyelitis). Your health care provider may recommend medicine to help with the pain. If your joint pain continues, additional tests may be needed to diagnose your condition. HOME CARE INSTRUCTIONS Watch your condition for any changes. Follow these instructions as directed to lessen the pain that you are feeling.  Take medicines only as directed by your health care provider.  Rest the affected area for as long as your health care provider says that you should. If directed to do so, raise the painful joint above the level of your heart while you are sitting or lying down.  Do not do things that cause or worsen pain.  If directed, apply ice to the painful area:  Put ice in a plastic bag.  Place a towel between your skin and the bag.  Leave the ice on for 20 minutes, 2-3 times per day.  Wear an elastic bandage, splint, or sling as directed by your health care provider. Loosen the elastic bandage or splint if your fingers or toes become numb and tingle, or if they turn cold and blue.  Begin exercising or stretching the affected area as directed by your health care provider. Ask your health care provider what types of exercise are safe for you.  Keep all follow-up visits as directed by your health care provider. This is important. SEEK MEDICAL CARE IF:  Your pain increases, and medicine does not help.  Your  joint pain does not improve within 3 days.  You have increased bruising or swelling.  You have a fever.  You lose 10 lb (4.5 kg) or more without trying. SEEK IMMEDIATE MEDICAL CARE IF:  You are not able to move the joint.  Your fingers or toes become numb or they turn cold and blue.   This information is not intended to replace advice given to you by your health care provider. Make sure you discuss any questions you have with your health care provider.   Document Released: 12/17/2005 Document Revised: 01/07/2015 Document Reviewed: 09/28/2014 Elsevier Interactive Patient Education 2016 [employer] Inc[employer]  Tailbone Injury The tailbone (coccyx) is the small bone at the lower end of the spine. A tailbone injury may involve stretched ligaments, bruising, or a broken bone (fracture). Tailbone injuries can be painful, and some may take a long time to heal. CAUSES This condition is often caused by falling and landing on the tailbone. Other causes include:  Repeated strain or friction from actions such as rowing and bicycling.  Childbirth. In some cases, the cause may not be known. RISK FACTORS This condition is more common in women than in men. SYMPTOMS Symptoms of this condition include:  Pain in the lower back, especially when sitting.  Pain or difficulty when standing up from a sitting position.  Bruising in the tailbone area.  Painful bowel movements.  In women, pain during intercourse. DIAGNOSIS This condition may be diagnosed based on your symptoms and a physical exam. X-rays may be taken if a fracture is suspected. You may also have other tests, such as a CT scan or MRI. TREATMENT This condition may be treated with medicines to help relieve your pain. Most tailbone injuries heal on their own in 4-6 weeks. However, recovery time may be longer if the injury involves a fracture. HOME CARE INSTRUCTIONS  Take medicines only as directed by your health care provider.  If  directed, apply ice to the injured area:  Put ice in a plastic bag.  Place a towel between your skin and the bag.  Leave the ice on for 20 minutes, 2-3 times per day for the first 1-2 days.  Sit on a large, rubber or inflated ring or cushion to ease your pain. Lean forward when you are sitting to help decrease discomfort.  Avoid sitting for long periods of time.  Increase your activity as the pain allows. Perform any exercises that are recommended by your health care provider or physical therapist.  If you have pain during bowel movements, use stool softeners as directed by your health care provider.  Eat a diet that includes plenty of fiber to help prevent constipation.  Keep all follow-up visits as directed by your health care provider. This is important. PREVENTION Wear appropriate padding and sports gear when bicycling and rowing. This can help to prevent developing an injury  that is caused by repeated strain or friction. SEEK MEDICAL CARE IF:  Your pain becomes worse.  Your bowel movements cause a great deal of discomfort.  You are unable to have a bowel movement.  You have uncontrolled urine loss (urinary incontinence).  You have a fever.   This information is not intended to replace advice given to you by your health care provider. Make sure you discuss any questions you have with your health care provider.   Document Released: 12/14/2000 Document Revised: 05/03/2015 Document Reviewed: 12/13/2014 Elsevier Interactive Patient Education [employer]

## 2016-04-13 NOTE — ED Notes (Signed)
Pt presents with onset of sacral pain since yesterday.  Pt reports falling in the shower, landing on buttocks.  Pt ambulatory from triage.

## 2016-04-13 NOTE — ED Provider Notes (Signed)
CSN: [id number]     Arrival date & time 04/13/16  0702 History   First MD Initiated Contact with Patient 04/13/16 [phone]     Chief Complaint  Patient presents with  . Hip Pain     (Consider location/radiation/quality/duration/timing/severity/associated sxs/prior Treatment) HPI   Evelyn [name] is a 35 y/o female with hx of lupus, arthritis, osteonecrosis, she presents the emergency department after falling in the shower yesterday when she states that her right leg gave out. She states that she has a collapsed right hip which is scheduled for surgery next month however she has been having more pain and difficulty with her left hip. After falling her left hip pain is much worse. She also complains of achy and throbbing low back and buttock pain with left hip pain, rated 10 out of 10. Pain is exacerbated with coughing or tablet have a bowel movement or urinate. It is slightly improved with holding still. She is able to ambulate with her cane but is very painful.  She does have bilateral lower extremity weakness from her chronic conditions, no change from her baseline.  She denies any other symptoms. She did not hit her head, did not lose consciousness, denies being on blood thinners.  Past Medical History  Diagnosis Date  . Headache(784.0)   . Anxiety   . Anemia   . Depression   . Lupus (HCC)   . Lupus nephritis (HCC)   . Pericardial effusion     Intermittent. Associated with lupus.  . Arthritis     rheumatoid  . Osteonecrosis (HCC)     bilateral legs, worse in right hip.  D/t lupus   Past Surgical History  Procedure Laterality Date  . Dilation and evacuation  11/10  . Laparoscopic tubal ligation  07/10/2011    Procedure: LAPAROSCOPIC TUBAL LIGATION;  Surgeon: Purcell Nails, MD;  Location: [facility];  Service: Gynecology;  Laterality: Bilateral;  fulguration  . Tubal ligation     Family History  Problem Relation Age of Onset  . Hypertension Mother    Social History  Substance  Use Topics  . Smoking status: Current Every Day Smoker -- 0.25 packs/day for 7 years    Types: Cigarettes  . Smokeless tobacco: Never Used  . Alcohol Use: 1.8 oz/week    3 Shots of liquor per week   OB History    Gravida Para Term Preterm AB TAB SAB Ectopic Multiple Living   3 1 1  2  2   1      Review of Systems  All other systems reviewed and are negative.     Allergies  Alfalfa; Metoclopramide hcl; Alpha blocker quinazolines; Ciprofloxacin; Cyclobenzaprine; Enoxaparin; Garlic; Nsaids; Pork-derived products; Sulfur; and Tramadol  Home Medications   Prior to Admission medications   Medication Sig Start Date End Date Taking? Authorizing Provider  acetaminophen (TYLENOL) 650 MG CR tablet Take 1,950 mg by mouth every 4 (four) hours as needed for pain.     Historical Provider, MD  cephALEXin (KEFLEX) 500 MG capsule Take 1 capsule (500 mg total) by mouth 4 (four) times daily. 03/25/16   03/27/16, PA-C  diphenhydrAMINE (BENADRYL) 25 MG tablet Take 1 tablet (25 mg total) by mouth every 6 (six) hours as needed for itching. 03/25/16   03/27/16, PA-C  diphenoxylate-atropine (LOMOTIL) 2.5-0.025 MG per tablet Take 1 tablet by mouth 4 (four) times daily as needed for diarrhea or loose stools. Patient not taking: Reported on 03/25/2016 08/28/15   08/30/15  Adriana Simas, MD  hydroxychloroquine (PLAQUENIL) 200 MG tablet Take 1 tablet (200 mg total) by mouth 2 (two) times daily. For lupus. 09/17/13   Tamala Julian, PA-C  oxyCODONE-acetaminophen (PERCOCET) 5-325 MG tablet Take 1-2 tablets by mouth every 6 (six) hours as needed for severe pain. 04/13/16   Danelle Berry, PA-C  oxymorphone (OPANA ER) 10 MG 12 hr tablet Take 20 mg by mouth every 12 (twelve) hours.    Historical Provider, MD  predniSONE (DELTASONE) 20 MG tablet Take 2 tablets (40 mg total) by mouth daily. 03/25/16   Mady Gemma, PA-C  prochlorperazine (COMPAZINE) 10 MG tablet Take 10 mg by mouth 2 (two) times daily as needed  for nausea or vomiting (headache).     Historical Provider, MD  venlafaxine XR (EFFEXOR XR) 75 MG 24 hr capsule Take 1 capsule (75 mg total) by mouth daily with breakfast. Patient taking differently: Take 75 mg by mouth daily as needed (anxiety).  09/28/14   Denton Brick, MD   BP 105/81 mmHg  Pulse 77  Temp(Src) 97.8 F (36.6 C) (Oral)  Resp 20  Ht 5\' 6"  (1.676 m)  Wt 57.607 kg  BMI 20.51 kg/m2  SpO2 100% Physical Exam  Constitutional: She is oriented to person, place, and time. She appears well-developed and well-nourished. No distress.  Chronically ill-appearing female, nontoxic in appearance, NAD  HENT:  Head: Normocephalic and atraumatic.  Nose: Nose normal.  Mouth/Throat: Oropharynx is clear and moist.  Eyes: Conjunctivae and EOM are normal. Pupils are equal, round, and reactive to light. Right eye exhibits no discharge. Left eye exhibits no discharge. No scleral icterus.  Neck: Normal range of motion. Neck supple. No JVD present. No tracheal deviation present.  Cardiovascular: Normal rate, regular rhythm and intact distal pulses.   Pulmonary/Chest: Effort normal and breath sounds normal. No stridor. No respiratory distress.  Abdominal: Soft. Bowel sounds are normal. She exhibits no distension. There is no tenderness.  Musculoskeletal: She exhibits tenderness. She exhibits no edema.  No midline tenderness from cervical to lumbar spine, no step-off Tenderness to palpation left sacral area, without edema or erythema Left hip nontender to palpation, range of motion limited secondary to pain Neurovascularly intact in bilateral lower extremities   Lymphadenopathy:    She has no cervical adenopathy.  Neurological: She is alert and oriented to person, place, and time. She exhibits normal muscle tone.  Skin: Skin is warm and dry. No rash noted. She is not diaphoretic. No erythema. No pallor.  Psychiatric: She has a normal mood and affect. Her behavior is normal. Judgment and thought  content normal.  Nursing note and vitals reviewed.   ED Course  Procedures (including critical care time) Labs Review Labs Reviewed  POC URINE PREG, ED    Imaging Review Dg Hip Unilat With Pelvis 2-3 Views Left  04/13/2016  CLINICAL DATA:  Fall in shower yesterday with persistent left hip pain, initial encounter EXAM: DG HIP (WITH OR WITHOUT PELVIS) 2-3V LEFT COMPARISON:  03/13/2016 FINDINGS: Chronic changes are again noted in the right hip similar to that seen on the prior exam and again consistent with avascular necrosis. Some increased sclerosis in the left femoral head is noted stable from the prior exam. No acute fracture or dislocation is noted. No soft tissue abnormality is seen. 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 Increased sclerosis in the left femoral head similar to that seen on the prior exam. Possibility of micro fractures in this region would deserve consideration. Nonemergent  MR follow-up may be helpful for further evaluation. No definitive fracture is seen. Electronically Signed   By: Alcide Clever M.D.   On: 04/13/2016 09:48   I have personally reviewed and evaluated these images and lab results as part of my medical decision-making.   EKG Interpretation None      MDM   Patient with mechanical fall that occurred yesterday while in the shower due to chronic right hip pain with weakness, she presents with acute on chronic left hip pain and new sacral/buttock pain  X-rays negative for definitive fractures, radiologist suggests nonemergent MRI for follow-up. Patient is seen by Ortho @ [facility], and has a right hip surgery next month, she will follow up with Ortho.  Pain meds given and conservative measures encouraged, rest, ice, donut for sitting.  PT D/C in good condition.  Final diagnoses:  Left hip pain  Sacral back pain  Fall, initial encounter       Danelle Berry, PA-C 04/22/16 2344  Margarita Grizzle, MD 04/23/16 1243

## 2016-05-16 ENCOUNTER — Emergency Department (HOSPITAL_COMMUNITY)
Admission: EM | Admit: 2016-05-16 | Discharge: 2016-05-16 | Disposition: A | Discharge status: Home / Self Care | Payer: Medicaid - Out of State | Attending: Emergency Medicine | Admitting: Emergency Medicine

## 2016-05-16 ENCOUNTER — Encounter (HOSPITAL_COMMUNITY): Payer: Self-pay | Admitting: Emergency Medicine

## 2016-05-16 ENCOUNTER — Emergency Department (HOSPITAL_COMMUNITY): Payer: Medicaid - Out of State

## 2016-05-16 DIAGNOSIS — M069 Rheumatoid arthritis, unspecified: Secondary | ICD-10-CM | POA: Insufficient documentation

## 2016-05-16 DIAGNOSIS — Z79899 Other long term (current) drug therapy: Secondary | ICD-10-CM | POA: Insufficient documentation

## 2016-05-16 DIAGNOSIS — A599 Trichomoniasis, unspecified: Secondary | ICD-10-CM | POA: Insufficient documentation

## 2016-05-16 DIAGNOSIS — Z79891 Long term (current) use of opiate analgesic: Secondary | ICD-10-CM | POA: Insufficient documentation

## 2016-05-16 DIAGNOSIS — R531 Weakness: Secondary | ICD-10-CM

## 2016-05-16 DIAGNOSIS — Z7952 Long term (current) use of systemic steroids: Secondary | ICD-10-CM | POA: Insufficient documentation

## 2016-05-16 DIAGNOSIS — R112 Nausea with vomiting, unspecified: Secondary | ICD-10-CM

## 2016-05-16 DIAGNOSIS — F329 Major depressive disorder, single episode, unspecified: Secondary | ICD-10-CM | POA: Insufficient documentation

## 2016-05-16 DIAGNOSIS — Z792 Long term (current) use of antibiotics: Secondary | ICD-10-CM | POA: Insufficient documentation

## 2016-05-16 DIAGNOSIS — F17210 Nicotine dependence, cigarettes, uncomplicated: Secondary | ICD-10-CM | POA: Insufficient documentation

## 2016-05-16 LAB — URINALYSIS, ROUTINE W REFLEX MICROSCOPIC
Bilirubin Urine: NEGATIVE
GLUCOSE, UA: NEGATIVE mg/dL
Hgb urine dipstick: NEGATIVE
Ketones, ur: NEGATIVE mg/dL
NITRITE: NEGATIVE
PH: 7.5 (ref 5.0–8.0)
Protein, ur: 30 mg/dL — AB
SPECIFIC GRAVITY, URINE: 1.019 (ref 1.005–1.030)

## 2016-05-16 LAB — COMPREHENSIVE METABOLIC PANEL
ALBUMIN: 4.2 g/dL (ref 3.5–5.0)
ALT: 14 U/L (ref 14–54)
AST: 27 U/L (ref 15–41)
Alkaline Phosphatase: 46 U/L (ref 38–126)
Anion gap: 9 (ref 5–15)
BUN: 7 mg/dL (ref 6–20)
CHLORIDE: 107 mmol/L (ref 101–111)
CO2: 24 mmol/L (ref 22–32)
CREATININE: 0.61 mg/dL (ref 0.44–1.00)
Calcium: 9.1 mg/dL (ref 8.9–10.3)
GFR calc Af Amer: >60 mL/min (ref >60)
GLUCOSE: 79 mg/dL (ref 65–99)
POTASSIUM: 3.7 mmol/L (ref 3.5–5.1)
SODIUM: 140 mmol/L (ref 135–145)
Total Bilirubin: 0.7 mg/dL (ref 0.3–1.2)
Total Protein: 7.8 g/dL (ref 6.5–8.1)

## 2016-05-16 LAB — URINE MICROSCOPIC-ADD ON

## 2016-05-16 LAB — CBC WITH DIFFERENTIAL/PLATELET
BASOS PCT: 1 %
Basophils Absolute: 0 10*3/uL (ref 0.0–0.1)
EOS PCT: 0 %
Eosinophils Absolute: 0 10*3/uL (ref 0.0–0.7)
HCT: 34.3 % — ABNORMAL LOW (ref 36.0–46.0)
HEMOGLOBIN: 10.8 g/dL — AB (ref 12.0–15.0)
LYMPHS PCT: 28 %
Lymphs Abs: 1.2 10*3/uL (ref 0.7–4.0)
MCH: 29.3 pg (ref 26.0–34.0)
MCHC: 31.5 g/dL (ref 30.0–36.0)
MCV: 93.2 fL (ref 78.0–100.0)
MONOS PCT: 9 %
Monocytes Absolute: 0.4 10*3/uL (ref 0.1–1.0)
NEUTROS PCT: 62 %
Neutro Abs: 2.7 10*3/uL (ref 1.7–7.7)
Platelets: 257 10*3/uL (ref 150–400)
RBC: 3.68 MIL/uL — ABNORMAL LOW (ref 3.87–5.11)
RDW: 13.7 % (ref 11.5–15.5)
WBC: 4.3 10*3/uL (ref 4.0–10.5)

## 2016-05-16 LAB — ACETAMINOPHEN LEVEL

## 2016-05-16 LAB — I-STAT TROPONIN, ED: Troponin i, poc: 0 ng/mL (ref 0.00–0.08)

## 2016-05-16 LAB — POC URINE PREG, ED: PREG TEST UR: NEGATIVE

## 2016-05-16 MED ORDER — ONDANSETRON HCL 4 MG/2ML IJ SOLN
4.0000 mg | Freq: Once | INTRAMUSCULAR | Status: AC
  Administered 2016-05-16: 4 mg via INTRAVENOUS
  Filled 2016-05-16: qty 2

## 2016-05-16 MED ORDER — HYDROCORTISONE NA SUCCINATE PF 100 MG IJ SOLR
100.0000 mg | Freq: Once | INTRAMUSCULAR | Status: AC
  Administered 2016-05-16: 100 mg via INTRAVENOUS
  Filled 2016-05-16: qty 2

## 2016-05-16 MED ORDER — METRONIDAZOLE 500 MG PO TABS: 500.0000 mg | ORAL_TABLET | Freq: Two times a day (BID) | ORAL | Status: DC

## 2016-05-16 MED ORDER — ONDANSETRON 4 MG PO TBDP: 4.0000 mg | ORAL_TABLET | Freq: Three times a day (TID) | ORAL | Status: DC | PRN

## 2016-05-16 MED ORDER — CEFTRIAXONE SODIUM 250 MG IJ SOLR
250.0000 mg | Freq: Once | INTRAMUSCULAR | Status: AC
  Administered 2016-05-16: 250 mg via INTRAMUSCULAR
  Filled 2016-05-16: qty 250

## 2016-05-16 MED ORDER — SODIUM CHLORIDE 0.9 % IV BOLUS (SEPSIS)
1000.0000 mL | Freq: Once | INTRAVENOUS | Status: AC
  Administered 2016-05-16: 1000 mL via INTRAVENOUS

## 2016-05-16 MED ORDER — FENTANYL CITRATE (PF) 100 MCG/2ML IJ SOLN
50.0000 ug | Freq: Once | INTRAMUSCULAR | Status: AC
  Administered 2016-05-16: 50 ug via INTRAVENOUS
  Filled 2016-05-16: qty 2

## 2016-05-16 MED ORDER — METRONIDAZOLE 500 MG PO TABS
500.0000 mg | ORAL_TABLET | Freq: Once | ORAL | Status: AC
  Administered 2016-05-16: 500 mg via ORAL
  Filled 2016-05-16: qty 1

## 2016-05-16 MED ORDER — AZITHROMYCIN 250 MG PO TABS
1000.0000 mg | ORAL_TABLET | Freq: Once | ORAL | Status: AC
  Administered 2016-05-16: 1000 mg via ORAL
  Filled 2016-05-16: qty 4

## 2016-05-16 MED ORDER — HYDROCODONE-ACETAMINOPHEN 5-325 MG PO TABS
2.0000 | ORAL_TABLET | Freq: Once | ORAL | Status: AC
  Administered 2016-05-16: 2 via ORAL
  Filled 2016-05-16: qty 2

## 2016-05-16 MED ORDER — LIDOCAINE HCL 1 % IJ SOLN
INTRAMUSCULAR | Status: AC
  Administered 2016-05-16: 20 mL
  Filled 2016-05-16: qty 20

## 2016-05-16 NOTE — ED Notes (Signed)
Patient transported to X-ray 

## 2016-05-16 NOTE — ED Notes (Signed)
Pt made aware that we need a urine sample and sts "I haven't peed since 3am."  Pt informed that we would check back after the fluid bolus.

## 2016-05-16 NOTE — ED Notes (Signed)
Pt ambulated in room unassisted. Pt tearful at bedside

## 2016-05-16 NOTE — Discharge Instructions (Signed)
Weakness Weakness is a lack of strength. It may be felt all over the body (generalized) or in one specific part of the body (focal). Some causes of weakness can be serious. You may need further medical evaluation, especially if you are elderly or you have a history of immunosuppression (such as chemotherapy or HIV), kidney disease, heart disease, or diabetes. CAUSES  Weakness can be caused by many different things, including:  Infection.  Physical exhaustion.  Internal bleeding or other blood loss that results in a lack of red blood cells (anemia).  Dehydration. This cause is more common in elderly people.  Side effects or electrolyte abnormalities from medicines, such as pain medicines or sedatives.  Emotional distress, anxiety, or depression.  Circulation problems, especially severe peripheral arterial disease.  Heart disease, such as rapid atrial fibrillation, bradycardia, or heart failure.  Nervous system disorders, such as Guillain-Barr syndrome, multiple sclerosis, or stroke. DIAGNOSIS  To find the cause of your weakness, your caregiver will take your history and perform a physical exam. Lab tests or X-rays may also be ordered, if needed. TREATMENT  Treatment of weakness depends on the cause of your symptoms and can vary greatly. HOME CARE INSTRUCTIONS   Rest as needed.  Eat a well-balanced diet.  Try to get some exercise every day.  Only take over-the-counter or prescription medicines as directed by your caregiver. SEEK MEDICAL CARE IF:   Your weakness seems to be getting worse or spreads to other parts of your body.  You develop new aches or pains. SEEK IMMEDIATE MEDICAL CARE IF:   You cannot perform your normal daily activities, such as getting dressed and feeding yourself.  You cannot walk up and down stairs, or you feel exhausted when you do so.  You have shortness of breath or chest pain.  You have difficulty moving parts of your body.  You have  weakness in only one area of the body or on only one side of the body.  You have a fever.  You have trouble speaking or swallowing.  You cannot control your bladder or bowel movements.  You have black or bloody vomit or stools. MAKE SURE YOU:  Understand these instructions.  Will watch your condition.  Will get help right away if you are not doing well or get worse.   This information is not intended to replace advice given to you by your health care provider. Make sure you discuss any questions you have with your health care provider.   Document Released: 12/17/2005 Document Revised: 06/17/2012 Document Reviewed: 02/15/2012 Elsevier Interactive Patient Education 2016 [employer]  Nausea and Vomiting Nausea is a sick feeling that often comes before throwing up (vomiting). Vomiting is a reflex where stomach contents come out of your mouth. Vomiting can cause severe loss of body fluids (dehydration). Children and elderly adults can become dehydrated quickly, especially if they also have diarrhea. Nausea and vomiting are symptoms of a condition or disease. It is important to find the cause of your symptoms. CAUSES   Direct irritation of the stomach lining. This irritation can result from increased acid production (gastroesophageal reflux disease), infection, food poisoning, taking certain medicines (such as nonsteroidal anti-inflammatory drugs), alcohol use, or tobacco use.  Signals from the brain.These signals could be caused by a headache, heat exposure, an inner ear disturbance, increased pressure in the brain from injury, infection, a tumor, or a concussion, pain, emotional stimulus, or metabolic problems.  An obstruction in the gastrointestinal tract (bowel obstruction).  Illnesses such  as diabetes, hepatitis, gallbladder problems, appendicitis, kidney problems, cancer, sepsis, atypical symptoms of a heart attack, or eating disorders.  Medical treatments such as chemotherapy  and radiation.  Receiving medicine that makes you sleep (general anesthetic) during surgery. DIAGNOSIS Your caregiver may ask for tests to be done if the problems do not improve after a few days. Tests may also be done if symptoms are severe or if the reason for the nausea and vomiting is not clear. Tests may include:  Urine tests.  Blood tests.  Stool tests.  Cultures (to look for evidence of infection).  X-rays or other imaging studies. Test results can help your caregiver make decisions about treatment or the need for additional tests. TREATMENT You need to stay well hydrated. Drink frequently but in small amounts.You may wish to drink water, sports drinks, clear broth, or eat frozen ice pops or gelatin dessert to help stay hydrated.When you eat, eating slowly may help prevent nausea.There are also some antinausea medicines that may help prevent nausea. HOME CARE INSTRUCTIONS   Take all medicine as directed by your caregiver.  If you do not have an appetite, do not force yourself to eat. However, you must continue to drink fluids.  If you have an appetite, eat a normal diet unless your caregiver tells you differently.  Eat a variety of complex carbohydrates (rice, wheat, potatoes, bread), lean meats, yogurt, fruits, and vegetables.  Avoid high-fat foods because they are more difficult to digest.  Drink enough water and fluids to keep your urine clear or pale yellow.  If you are dehydrated, ask your caregiver for specific rehydration instructions. Signs of dehydration may include:  Severe thirst.  Dry lips and mouth.  Dizziness.  Dark urine.  Decreasing urine frequency and amount.  Confusion.  Rapid breathing or pulse. SEEK IMMEDIATE MEDICAL CARE IF:   You have blood or brown flecks (like coffee grounds) in your vomit.  You have black or bloody stools.  You have a severe headache or stiff neck.  You are confused.  You have severe abdominal pain.  You  have chest pain or trouble breathing.  You do not urinate at least once every 8 hours.  You develop cold or clammy skin.  You continue to vomit for longer than 24 to 48 hours.  You have a fever. MAKE SURE YOU:   Understand these instructions.  Will watch your condition.  Will get help right away if you are not doing well or get worse.   This information is not intended to replace advice given to you by your health care provider. Make sure you discuss any questions you have with your health care provider.   Document Released: 12/17/2005 Document Revised: 03/10/2012 Document Reviewed: 05/16/2011 Elsevier Interactive Patient Education 2016 [employer].  Trichomoniasis Trichomoniasis is an infection caused by an organism called Trichomonas. The infection can affect both women and men. In women, the outer female genitalia and the vagina are affected. In men, the penis is mainly affected, but the prostate and other reproductive organs can also be involved. Trichomoniasis is a sexually transmitted infection (STI) and is most often passed to another person through sexual contact.  RISK FACTORS  Having unprotected sexual intercourse.  Having sexual intercourse with an infected partner. SIGNS AND SYMPTOMS  Symptoms of trichomoniasis in women include:  Abnormal gray-green frothy vaginal discharge.  Itching and irritation of the vagina.  Itching and irritation of the area outside the vagina. Symptoms of trichomoniasis in men include:   Penile  discharge with or without pain.  Pain during urination. This results from inflammation of the urethra. DIAGNOSIS  Trichomoniasis may be found during a Pap test or physical exam. Your health care provider may use one of the following methods to help diagnose this infection:  Testing the pH of the vagina with a test tape.  Using a vaginal swab test that checks for the Trichomonas organism. A test is available that provides results within a few  minutes.  Examining a urine sample.  Testing vaginal secretions. Your health care provider may test you for other STIs, including HIV. TREATMENT   You may be given medicine to fight the infection. Women should inform their health care provider if they could be or are pregnant. Some medicines used to treat the infection should not be taken during pregnancy.  Your health care provider may recommend over-the-counter medicines or creams to decrease itching or irritation.  Your sexual partner will need to be treated if infected.  Your health care provider may test you for infection again 3 months after treatment. HOME CARE INSTRUCTIONS   Take medicines only as directed by your health care provider.  Take over-the-counter medicine for itching or irritation as directed by your health care provider.  Do not have sexual intercourse while you have the infection.  Women should not douche or wear tampons while they have the infection.  Discuss your infection with your partner. Your partner may have gotten the infection from you, or you may have gotten it from your partner.  Have your sex partner get examined and treated if necessary.  Practice safe, informed, and protected sex.  See your health care provider for other STI testing. SEEK MEDICAL CARE IF:   You still have symptoms after you finish your medicine.  You develop abdominal pain.  You have pain when you urinate.  You have bleeding after sexual intercourse.  You develop a rash.  Your medicine makes you sick or makes you throw up (vomit). MAKE SURE YOU:  Understand these instructions.  Will watch your condition.  Will get help right away if you are not doing well or get worse.   This information is not intended to replace advice given to you by your health care provider. Make sure you discuss any questions you have with your health care provider.   Document Released: 06/12/2001 Document Revised: 01/07/2015 Document  Reviewed: 09/28/2013 Elsevier Interactive Patient Education [employer]

## 2016-05-16 NOTE — ED Notes (Signed)
Pt given ginger ale to take PO medications.  No vomiting noted during visit.

## 2016-05-16 NOTE — ED Provider Notes (Signed)
CSN: [id number]     Arrival date & time 05/16/16  1039 History   First MD Initiated Contact with Patient 05/16/16 1119     Chief Complaint  Patient presents with  . Nausea  . 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 She was at home is had 2 days of intermittent vomiting which has caused severe weakness and inability to take her daily medications, including 7.5 mg of prednisone daily. She went to the restroom this morning and fell to the ground was unable to get up so she called 911. She was brought in by EMS for severe weakness. She does pain in her legs described as cramping and aching she also has pain in her chest and low back. She states that she only has abdominal pain when she is vomiting and none currently.  The pain is worse in her legs, some of it is chronic but worsened the past 2 days of vomiting and inability to take her steroids.  Her chest is described as achy there is not one focal area of pain. She states it does not feel like chest pain she's had in the past when she had pericardial effusion. This time she felt like she was being strangled in her chest and today she does not feel like that. She does not feel short of breath. She does feel weak and faint. She states that she is not currently on any narcotic pain medication and she has been taking large amounts of Tylenol to treat her pain because she did not want to come to the hospital. She states that she finished a bottle of Tylenol over the last week to treat her chronic pain and acute pain. She states that she's been taking up to 12 Tylenol tablets a day,  until yesterday when she began vomiting. Her emesis is described as nonbloody, nonbilious and she denies any diarrhea. No history of abdominal surgeries. She had tubal ligation and does not believe she can be pregnant.  She denies vaginal discharge, pain, rash. She denies dysuria, hematuria, flank pain.  Past Medical History  Diagnosis Date  . Headache(784.0)   . Anxiety   . Anemia   . Depression   . Lupus (HCC)   . Lupus nephritis (HCC)   . Pericardial effusion     Intermittent. Associated with lupus.  . Arthritis     rheumatoid  . Osteonecrosis (HCC)     bilateral legs, worse in right hip.  D/t lupus   Past Surgical History  Procedure Laterality Date  . Dilation and evacuation  11/10  . Laparoscopic tubal ligation  07/10/2011    Procedure: LAPAROSCOPIC TUBAL LIGATION;  Surgeon: Purcell Nails, MD;  Location: [facility];  Service: Gynecology;  Laterality: Bilateral;  fulguration  . Tubal ligation     Family History  Problem Relation Age of Onset  . Hypertension Mother    Social History  Substance Use Topics  . Smoking status: Current Every Day Smoker -- 0.25 packs/day for 7 years    Types: Cigarettes  . Smokeless tobacco: Never Used  . Alcohol Use: 1.8 oz/week    3 Shots of liquor per week   OB History    Gravida Para Term  Preterm AB TAB SAB Ectopic Multiple Living   3 1 1  2  2   1      Review of Systems  All other systems reviewed and are negative.     Allergies  Alfalfa; Metoclopramide hcl; Alpha blocker quinazolines; Ciprofloxacin; Cyclobenzaprine; Enoxaparin; Garlic; Nsaids; Pork-derived products; Sulfur; and Tramadol  Home Medications   Prior to Admission medications   Medication Sig Start Date End Date Taking? Authorizing Provider  acetaminophen (TYLENOL) 650 MG CR tablet Take 1,950 mg by mouth every 4 (four) hours as needed for pain.    Yes Historical Provider, MD  diphenhydrAMINE (BENADRYL) 25 MG tablet Take 1 tablet (25 mg total) by mouth every 6 (six)  hours as needed for itching. Patient taking differently: Take 25 mg by mouth daily as needed for itching.  03/25/16  Yes 03/27/16, PA-C  hydroxychloroquine (PLAQUENIL) 200 MG tablet Take 1 tablet (200 mg total) by mouth 2 (two) times daily. For lupus. 09/17/13  Yes 09/19/13 T Mashburn, PA-C  predniSONE (DELTASONE) 5 MG tablet Take 7.5 mg by mouth daily with breakfast.   Yes Historical Provider, MD  prochlorperazine (COMPAZINE) 10 MG tablet Take 10 mg by mouth daily as needed for nausea or vomiting (headache).    Yes Historical Provider, MD  venlafaxine XR (EFFEXOR XR) 75 MG 24 hr capsule Take 1 capsule (75 mg total) by mouth daily with breakfast. Patient taking differently: Take 75 mg by mouth daily.  09/28/14  Yes 09/30/14, MD  cephALEXin (KEFLEX) 500 MG capsule Take 1 capsule (500 mg total) by mouth 4 (four) times daily. Patient not taking: Reported on 05/16/2016 03/25/16   03/27/16, PA-C  diphenoxylate-atropine (LOMOTIL) 2.5-0.025 MG per tablet Take 1 tablet by mouth 4 (four) times daily as needed for diarrhea or loose stools. Patient not taking: Reported on 03/25/2016 08/28/15   08/30/15, MD  metroNIDAZOLE (FLAGYL) 500 MG tablet Take 1 tablet (500 mg total) by mouth 2 (two) times daily. 05/16/16   05/18/16, PA-C  ondansetron (ZOFRAN ODT) 4 MG disintegrating tablet Take 1 tablet (4 mg total) by mouth every 8 (eight) hours as needed for nausea or vomiting. 05/16/16   05/18/16, PA-C  oxyCODONE-acetaminophen (PERCOCET) 5-325 MG tablet Take 1-2 tablets by mouth every 6 (six) hours as needed for severe pain. Patient not taking: Reported on 05/16/2016 04/13/16   04/15/16, PA-C  oxymorphone (OPANA ER) 10 MG 12 hr tablet Take 20 mg by mouth every 12 (twelve) hours. Reported on 05/16/2016    Historical Provider, MD  predniSONE (DELTASONE) 20 MG tablet Take 2 tablets (40 mg total) by mouth daily. Patient not taking: Reported on 05/16/2016 03/25/16   03/27/16, PA-C   BP  118/68 mmHg  Pulse 86  Temp(Src) 98.7 F (37.1 C) (Oral)  Resp 16  SpO2 97% Physical Exam  Constitutional: She is oriented to person, place, and time. She appears well-developed and well-nourished. No distress.  Thin woman, appears stated age, appears uncomfortable  HENT:  Head: Normocephalic and atraumatic.  Nose: Nose normal.  Mouth/Throat: Oropharynx is clear and moist. No oropharyngeal exudate.  Eyes: Conjunctivae and EOM are normal. Pupils are equal, round, and reactive to light. Right eye exhibits no discharge. Left eye exhibits no discharge. No scleral icterus.  Neck: Normal range of motion. Neck supple. No JVD present. No tracheal deviation present. No thyromegaly present.  Cardiovascular: Normal rate, regular rhythm, normal heart sounds and intact distal pulses.  Exam reveals no  gallop and no friction rub.   No murmur heard. Regular rate and rhythm, no murmur or gallup, pt examine laying flat and leaning forward, no rub auscultated, no JVD, no LE edema  Pulmonary/Chest: Effort normal and breath sounds normal. No respiratory distress. She has no wheezes. She has no rales. She exhibits no tenderness.  Abdominal: Soft. Bowel sounds are normal. She exhibits no distension and no mass. There is no tenderness. There is no rebound and no guarding.  Musculoskeletal: Normal range of motion. She exhibits no edema or tenderness.  Muscle compartments of his legs and arms soft and nontender, no joint edema or erythema  Lymphadenopathy:    She has no cervical adenopathy.  Neurological: She is alert and oriented to person, place, and time. She has normal reflexes. No cranial nerve deficit. She exhibits normal muscle tone. Coordination normal.  Skin: Skin is warm and dry. No rash noted. She is not diaphoretic. No erythema. No pallor.  Decreased skin turgor throughout  Psychiatric: She has a normal mood and affect. Her behavior is normal. Judgment and thought content normal.  Nursing note and  vitals reviewed.   ED Course  Procedures (including critical care time) Labs Review Labs Reviewed  CBC WITH DIFFERENTIAL/PLATELET - Abnormal; Notable for the following:    RBC 3.68 (*)    Hemoglobin 10.8 (*)    HCT 34.3 (*)    All other components within normal limits  URINALYSIS, ROUTINE W REFLEX MICROSCOPIC (NOT AT [facility]) - Abnormal; Notable for the following:    APPearance CLOUDY (*)    Protein, ur 30 (*)    Leukocytes, UA SMALL (*)    All other components within normal limits  ACETAMINOPHEN LEVEL - Abnormal; Notable for the following:    Acetaminophen (Tylenol), Serum <10 (*)    All other components within normal limits  URINE MICROSCOPIC-ADD ON - Abnormal; Notable for the following:    Squamous Epithelial / LPF 0-5 (*)    Bacteria, UA FEW (*)    All other components within normal limits  COMPREHENSIVE METABOLIC PANEL  I-STAT TROPOININ, ED  POC URINE PREG, ED    Imaging Review Dg Chest 2 View  05/16/2016  CLINICAL DATA:  Mid chest pain over the last few days. Nausea and vomiting with increased weakness. History of lupus and smoking. EXAM: CHEST  2 VIEW COMPARISON:  03/13/2016 FINDINGS: The cardiomediastinal silhouette is within normal limits. The lungs are well inflated and clear. There is no evidence of pleural effusion or pneumothorax. No acute osseous abnormality is identified. IMPRESSION: No active cardiopulmonary disease. Electronically Signed   By: Sebastian Ache M.D.   On: 05/16/2016 11:50   I have personally reviewed and evaluated these images and lab results as part of my medical decision-making.   EKG Interpretation None      MDM   35 year old female with lupus and daily prednisone use, developed nausea and vomiting yesterday and has been unable take her medications, presents to the EMR via EMS after attempting to the restroom today when she fell and was unable to get up and called 911 and they subsequently brought her to the ER for evaluation. She denies head  injury, LOC.  She complains of nausea, weakness and body aches including her legs, low back and chest.  She is not actively vomiting.  Vitals are stable, without hypotension and without tachycardia. Pt's CP is not consistent w/ pericardial effusion, CP is Achy and reproducible with palpation more concerned with nausea, vomiting and lack of  ability to take prednisone that electrolytes may be abnormal with fluid loss and sudden d/c of chronic steroids.  Patient denies abdominal pain, exam is benign, did not suspect acute abdomen, more likely GI virus Given patient's profound weakness and chest pain with history of pericardial effusion patient was monitored while in the ER, EKG and chest x-ray were obtained. Basic labs, troponin and urinalysis was ordered. She was given IV fluids, antiemetics, 100 mg of Solu-Cortef, and fentanyl.  Case was discussed with attending physician Dr. Lynelle Doctor.  The patient's lab work was significant for mild anemia which is consistent with her baseline, electrolytes within normal limits. Her orthostatics were negative. Urinalysis positive for Trichomonas and small leukocytes, EKG sinus rhythm, chest x-ray negative.  I discussed with herf urinalysis results, she was given rocephin, azithro and Flagyl. She was also given antiemetic prescription to discharge home with as well.   She has not had any symptoms of trichomonas, including no vaginal symptoms, no urinary symptoms.  No fever, no pelvic pain.  No concern for PID.  Pt was able to tolerate PO meds and fluids in the ER.  Pt safe to D/C home with oral meds.  D/C hemodynamically stable: Filed Vitals:   05/16/16 1144 05/16/16 1200 05/16/16 1230 05/16/16 1457  BP: 111/77 113/78 99/68 118/68  Pulse: 84 86 67 86  Temp:      TempSrc:      Resp: 17 18 17 16   SpO2: 100% 100% 98% 97%     Final diagnoses:  Non-intractable vomiting with nausea, vomiting of unspecified type  Weakness  Trichomonas infection      ,  PA-C 05/16/16 1600  05/18/16, MD 05/17/16 1225

## 2016-05-16 NOTE — ED Notes (Signed)
Per EMS, pt from home hx of lupus. Pt c/o "lupus flare up" feeling more weak then normal and vomiting since Tuesday. Pt also reports leg pain.

## 2016-05-21 ENCOUNTER — Emergency Department (HOSPITAL_COMMUNITY)
Admission: EM | Admit: 2016-05-21 | Discharge: 2016-05-21 | Disposition: A | Discharge status: Home / Self Care | Payer: Medicaid - Out of State | Attending: Emergency Medicine | Admitting: Emergency Medicine

## 2016-05-21 ENCOUNTER — Encounter (HOSPITAL_COMMUNITY): Payer: Self-pay | Admitting: Emergency Medicine

## 2016-05-21 DIAGNOSIS — F419 Anxiety disorder, unspecified: Secondary | ICD-10-CM | POA: Insufficient documentation

## 2016-05-21 DIAGNOSIS — Z862 Personal history of diseases of the blood and blood-forming organs and certain disorders involving the immune mechanism: Secondary | ICD-10-CM | POA: Insufficient documentation

## 2016-05-21 DIAGNOSIS — R55 Syncope and collapse: Secondary | ICD-10-CM | POA: Diagnosis not present

## 2016-05-21 DIAGNOSIS — Z79899 Other long term (current) drug therapy: Secondary | ICD-10-CM | POA: Diagnosis not present

## 2016-05-21 DIAGNOSIS — G8929 Other chronic pain: Secondary | ICD-10-CM | POA: Insufficient documentation

## 2016-05-21 DIAGNOSIS — F329 Major depressive disorder, single episode, unspecified: Secondary | ICD-10-CM | POA: Insufficient documentation

## 2016-05-21 DIAGNOSIS — R531 Weakness: Secondary | ICD-10-CM | POA: Diagnosis not present

## 2016-05-21 DIAGNOSIS — R197 Diarrhea, unspecified: Secondary | ICD-10-CM | POA: Diagnosis not present

## 2016-05-21 DIAGNOSIS — F17210 Nicotine dependence, cigarettes, uncomplicated: Secondary | ICD-10-CM | POA: Diagnosis not present

## 2016-05-21 DIAGNOSIS — Z8679 Personal history of other diseases of the circulatory system: Secondary | ICD-10-CM | POA: Insufficient documentation

## 2016-05-21 DIAGNOSIS — Z792 Long term (current) use of antibiotics: Secondary | ICD-10-CM | POA: Insufficient documentation

## 2016-05-21 DIAGNOSIS — Z7952 Long term (current) use of systemic steroids: Secondary | ICD-10-CM | POA: Diagnosis not present

## 2016-05-21 DIAGNOSIS — M069 Rheumatoid arthritis, unspecified: Secondary | ICD-10-CM | POA: Diagnosis not present

## 2016-05-21 LAB — URINE MICROSCOPIC-ADD ON

## 2016-05-21 LAB — COMPREHENSIVE METABOLIC PANEL
ALBUMIN: 3.7 g/dL (ref 3.5–5.0)
ALT: 13 U/L — AB (ref 14–54)
AST: 20 U/L (ref 15–41)
Alkaline Phosphatase: 44 U/L (ref 38–126)
Anion gap: 10 (ref 5–15)
CHLORIDE: 105 mmol/L (ref 101–111)
CO2: 24 mmol/L (ref 22–32)
CREATININE: 0.77 mg/dL (ref 0.44–1.00)
Calcium: 9.2 mg/dL (ref 8.9–10.3)
GFR calc Af Amer: >60 mL/min (ref >60)
GLUCOSE: 98 mg/dL (ref 65–99)
POTASSIUM: 2.9 mmol/L — AB (ref 3.5–5.1)
SODIUM: 139 mmol/L (ref 135–145)
Total Bilirubin: 0.5 mg/dL (ref 0.3–1.2)
Total Protein: 7.7 g/dL (ref 6.5–8.1)

## 2016-05-21 LAB — I-STAT CHEM 8, ED
BUN: <3 mg/dL — ABNORMAL LOW (ref 6–20)
Calcium, Ion: 1.1 mmol/L — ABNORMAL LOW (ref 1.12–1.23)
Chloride: 106 mmol/L (ref 101–111)
Creatinine, Ser: 0.5 mg/dL (ref 0.44–1.00)
Glucose, Bld: 106 mg/dL — ABNORMAL HIGH (ref 65–99)
HEMATOCRIT: 33 % — AB (ref 36.0–46.0)
HEMOGLOBIN: 11.2 g/dL — AB (ref 12.0–15.0)
POTASSIUM: 4.1 mmol/L (ref 3.5–5.1)
Sodium: 143 mmol/L (ref 135–145)
TCO2: 23 mmol/L (ref 0–100)

## 2016-05-21 LAB — URINALYSIS, ROUTINE W REFLEX MICROSCOPIC
BILIRUBIN URINE: NEGATIVE
GLUCOSE, UA: NEGATIVE mg/dL
Ketones, ur: NEGATIVE mg/dL
Nitrite: NEGATIVE
PROTEIN: 100 mg/dL — AB
SPECIFIC GRAVITY, URINE: 1.014 (ref 1.005–1.030)
pH: 7.5 (ref 5.0–8.0)

## 2016-05-21 LAB — CBC
HEMATOCRIT: 35.1 % — AB (ref 36.0–46.0)
Hemoglobin: 10.9 g/dL — ABNORMAL LOW (ref 12.0–15.0)
MCH: 28.5 pg (ref 26.0–34.0)
MCHC: 31.1 g/dL (ref 30.0–36.0)
MCV: 91.6 fL (ref 78.0–100.0)
PLATELETS: 276 10*3/uL (ref 150–400)
RBC: 3.83 MIL/uL — ABNORMAL LOW (ref 3.87–5.11)
RDW: 13.1 % (ref 11.5–15.5)
WBC: 6.5 10*3/uL (ref 4.0–10.5)

## 2016-05-21 LAB — MAGNESIUM: MAGNESIUM: 1.6 mg/dL — AB (ref 1.7–2.4)

## 2016-05-21 LAB — LIPASE, BLOOD: LIPASE: 27 U/L (ref 11–51)

## 2016-05-21 MED ORDER — POTASSIUM CHLORIDE 10 MEQ/100ML IV SOLN
10.0000 meq | INTRAVENOUS | Status: AC
  Administered 2016-05-21 (×2): 10 meq via INTRAVENOUS
  Filled 2016-05-21 (×2): qty 100

## 2016-05-21 MED ORDER — SODIUM CHLORIDE 0.9 % IV BOLUS (SEPSIS)
1000.0000 mL | Freq: Once | INTRAVENOUS | Status: AC
  Administered 2016-05-21: 1000 mL via INTRAVENOUS

## 2016-05-21 MED ORDER — HYDROCORTISONE NA SUCCINATE PF 100 MG IJ SOLR
100.0000 mg | Freq: Once | INTRAMUSCULAR | Status: AC
  Administered 2016-05-21: 100 mg via INTRAVENOUS
  Filled 2016-05-21: qty 2

## 2016-05-21 MED ORDER — ONDANSETRON HCL 4 MG/2ML IJ SOLN
4.0000 mg | Freq: Once | INTRAMUSCULAR | Status: AC
  Administered 2016-05-21: 4 mg via INTRAVENOUS
  Filled 2016-05-21: qty 2

## 2016-05-21 MED ORDER — DIPHENOXYLATE-ATROPINE 2.5-0.025 MG PO TABS: 1.0000 | ORAL_TABLET | Freq: Four times a day (QID) | ORAL | Status: DC | PRN

## 2016-05-21 MED ORDER — OXYCODONE-ACETAMINOPHEN 5-325 MG PO TABS: 1.0000 | ORAL_TABLET | Freq: Four times a day (QID) | ORAL | Status: DC | PRN

## 2016-05-21 MED ORDER — POTASSIUM CHLORIDE CRYS ER 20 MEQ PO TBCR
40.0000 meq | EXTENDED_RELEASE_TABLET | Freq: Once | ORAL | Status: AC
  Administered 2016-05-21: 40 meq via ORAL
  Filled 2016-05-21: qty 2

## 2016-05-21 MED ORDER — MAGNESIUM SULFATE 2 GM/50ML IV SOLN
2.0000 g | INTRAVENOUS | Status: AC
  Administered 2016-05-21: 2 g via INTRAVENOUS
  Filled 2016-05-21: qty 50

## 2016-05-21 MED ORDER — MORPHINE SULFATE (PF) 4 MG/ML IV SOLN
4.0000 mg | Freq: Once | INTRAVENOUS | Status: AC
  Administered 2016-05-21: 4 mg via INTRAVENOUS
  Filled 2016-05-21: qty 1

## 2016-05-21 MED ORDER — OXYCODONE-ACETAMINOPHEN 5-325 MG PO TABS
2.0000 | ORAL_TABLET | Freq: Once | ORAL | Status: AC
  Administered 2016-05-21: 2 via ORAL
  Filled 2016-05-21: qty 2

## 2016-05-21 MED ORDER — CULTURELLE DIGESTIVE HEALTH PO CAPS: 1.0000 | ORAL_CAPSULE | Freq: Every day | ORAL | Status: DC

## 2016-05-21 NOTE — ED Notes (Signed)
Pt reports generalized pain and diarrhea x 5 days. Pt also reports a syncopal episode in the bathroom. Pt alert x4. NAD at this time.

## 2016-05-21 NOTE — Discharge Instructions (Signed)
Diarrhea Diarrhea is frequent loose and watery bowel movements. It can cause you to feel weak and dehydrated. Dehydration can cause you to become tired and thirsty, have a dry mouth, and have decreased urination that often is dark yellow. Diarrhea is a sign of another problem, most often an infection that will not last long. In most cases, diarrhea typically lasts 2-3 days. However, it can last longer if it is a sign of something more serious. It is important to treat your diarrhea as directed by your caregiver to lessen or prevent future episodes of diarrhea. CAUSES  Some common causes include:  Gastrointestinal infections caused by viruses, bacteria, or parasites.  Food poisoning or food allergies.  Certain medicines, such as antibiotics, chemotherapy, and laxatives.  Artificial sweeteners and fructose.  Digestive disorders. HOME CARE INSTRUCTIONS  Ensure adequate fluid intake (hydration): Have 1 cup (8 oz) of fluid for each diarrhea episode. Avoid fluids that contain simple sugars or sports drinks, fruit juices, whole milk products, and sodas. Your urine should be clear or pale yellow if you are drinking enough fluids. Hydrate with an oral rehydration solution that you can purchase at pharmacies, retail stores, and online. You can prepare an oral rehydration solution at home by mixing the following ingredients together:   - tsp table salt.   tsp baking soda.   tsp salt substitute containing potassium chloride.  1  tablespoons sugar.  1 L (34 oz) of water.  Certain foods and beverages may increase the speed at which food moves through the gastrointestinal (GI) tract. These foods and beverages should be avoided and include:  Caffeinated and alcoholic beverages.  High-fiber foods, such as raw fruits and vegetables, nuts, seeds, and whole grain breads and cereals.  Foods and beverages sweetened with sugar alcohols, such as xylitol, sorbitol, and mannitol.  Some foods may be well  tolerated and may help thicken stool including:  Starchy foods, such as rice, toast, pasta, low-sugar cereal, oatmeal, grits, baked potatoes, crackers, and bagels.  Bananas.  Applesauce.  Add probiotic-rich foods to help increase healthy bacteria in the GI tract, such as yogurt and fermented milk products.  Wash your hands well after each diarrhea episode.  Only take over-the-counter or prescription medicines as directed by your caregiver.  Take a warm bath to relieve any burning or pain from frequent diarrhea episodes. SEEK IMMEDIATE MEDICAL CARE IF:   You are unable to keep fluids down.  You have persistent vomiting.  You have blood in your stool, or your stools are black and tarry.  You do not urinate in 6-8 hours, or there is only a small amount of very dark urine.  You have abdominal pain that increases or localizes.  You have weakness, dizziness, confusion, or light-headedness.  You have a severe headache.  Your diarrhea gets worse or does not get better.  You have a fever or persistent symptoms for more than 2-3 days.  You have a fever and your symptoms suddenly get worse. MAKE SURE YOU:   Understand these instructions.  Will watch your condition.  Will get help right away if you are not doing well or get worse.   This information is not intended to replace advice given to you by your health care provider. Make sure you discuss any questions you have with your health care provider.   Document Released: 12/07/2002 Document Revised: 01/07/2015 Document Reviewed: 08/24/2012 Elsevier Interactive Patient Education 2016 [employer].  Food Choices to Help Relieve Diarrhea, Adult When you have diarrhea,  the foods you eat and your eating habits are very important. Choosing the right foods and drinks can help relieve diarrhea. Also, because diarrhea can last up to 7 days, you need to replace lost fluids and electrolytes (such as sodium, potassium, and chloride) in  order to help prevent dehydration.  °WHAT GENERAL GUIDELINES DO I NEED TO FOLLOW? °· Slowly drink 1 cup (8 oz) of fluid for each episode of diarrhea. If you are getting enough fluid, your urine will be clear or pale yellow. °· Eat starchy foods. Some good choices include white rice, white toast, pasta, low-fiber cereal, baked potatoes (without the skin), saltine crackers, and bagels. °· Avoid large servings of any cooked vegetables. °· Limit fruit to two servings per day. A serving is ½ cup or 1 small piece. °· Choose foods with less than 2 g of fiber per serving. °· Limit fats to less than 8 tsp (38 g) per day. °· Avoid fried foods. °· Eat foods that have probiotics in them. Probiotics can be found in certain dairy products. °· Avoid foods and beverages that may increase the speed at which food moves through the stomach and intestines (gastrointestinal tract). Things to avoid include: °· High-fiber foods, such as dried fruit, raw fruits and vegetables, nuts, seeds, and whole grain foods. °· Spicy foods and high-fat foods. °· Foods and beverages sweetened with high-fructose corn syrup, honey, or sugar alcohols such as xylitol, sorbitol, and mannitol. °WHAT FOODS ARE RECOMMENDED? °Grains °White rice. White, French, or pita breads (fresh or toasted), including plain rolls, buns, or bagels. White pasta. Saltine, soda, or graham crackers. Pretzels. Low-fiber cereal. Cooked cereals made with water (such as cornmeal, farina, or cream cereals). Plain muffins. Matzo. Melba toast. Zwieback.  °Vegetables °Potatoes (without the skin). Strained tomato and vegetable juices. Most well-cooked and canned vegetables without seeds. Tender lettuce. °Fruits °Cooked or canned applesauce, apricots, cherries, fruit cocktail, grapefruit, peaches, pears, or plums. Fresh bananas, apples without skin, cherries, grapes, cantaloupe, grapefruit, peaches, oranges, or plums.  °Meat and Other Protein Products °Baked or boiled chicken. Eggs. Tofu.  Fish. Seafood. Smooth peanut butter. Ground or well-cooked tender beef, ham, veal, lamb, pork, or poultry.  °Dairy °Plain yogurt, kefir, and unsweetened liquid yogurt. Lactose-free milk, buttermilk, or soy milk. Plain hard cheese. °Beverages °Sport drinks. Clear broths. Diluted fruit juices (except prune). Regular, caffeine-free sodas such as ginger ale. Water. Decaffeinated teas. Oral rehydration solutions. Sugar-free beverages not sweetened with sugar alcohols. °Other °Bouillon, broth, or soups made from recommended foods.  °The items listed above may not be a complete list of recommended foods or beverages. Contact your dietitian for more options. °WHAT FOODS ARE NOT RECOMMENDED? °Grains °Whole grain, whole wheat, bran, or rye breads, rolls, pastas, crackers, and cereals. Wild or brown rice. Cereals that contain more than 2 g of fiber per serving. Corn tortillas or taco shells. Cooked or dry oatmeal. Granola. Popcorn. °Vegetables °Raw vegetables. Cabbage, broccoli, Brussels sprouts, artichokes, baked beans, beet greens, corn, kale, legumes, peas, sweet potatoes, and yams. Potato skins. Cooked spinach and cabbage. °Fruits °Dried fruit, including raisins and dates. Raw fruits. Stewed or dried prunes. Fresh apples with skin, apricots, mangoes, pears, raspberries, and strawberries.  °Meat and Other Protein Products °Chunky peanut butter. Nuts and seeds. Beans and lentils. Bacon.  °Dairy °High-fat cheeses. Milk, chocolate milk, and beverages made with milk, such as milk shakes. Cream. Ice cream. °Sweets and Desserts °Sweet rolls, doughnuts, and sweet breads. Pancakes and waffles. °Fats and Oils °Butter. Cream sauces. Margarine.   Salad oils. Plain salad dressings. Olives. Avocados.  Beverages Caffeinated beverages (such as coffee, tea, soda, or energy drinks). Alcoholic beverages. Fruit juices with pulp. Prune juice. Soft drinks sweetened with high-fructose corn syrup or sugar alcohols. Other Coconut. Hot sauce.  Chili powder. Mayonnaise. Gravy. Cream-based or milk-based soups.  The items listed above may not be a complete list of foods and beverages to avoid. Contact your dietitian for more information. WHAT SHOULD I DO IF I BECOME DEHYDRATED? Diarrhea can sometimes lead to dehydration. Signs of dehydration include dark urine and dry mouth and skin. If you think you are dehydrated, you should rehydrate with an oral rehydration solution. These solutions can be purchased at pharmacies, retail stores, or online.  Drink -1 cup (120-240 mL) of oral rehydration solution each time you have an episode of diarrhea. If drinking this amount makes your diarrhea worse, try drinking smaller amounts more often. For example, drink 1-3 tsp (5-15 mL) every 5-10 minutes.  A general rule for staying hydrated is to drink 1-2 L of fluid per day. Talk to your health care provider about the specific amount you should be drinking each day. Drink enough fluids to keep your urine clear or pale yellow.   This information is not intended to replace advice given to you by your health care provider. Make sure you discuss any questions you have with your health care provider.   Document Released: 03/08/2004 Document Revised: 01/07/2015 Document Reviewed: 11/09/2013 Elsevier Interactive Patient Education 2016 [employer].  Near-Syncope Near-syncope (commonly known as near fainting) is sudden weakness, dizziness, or feeling like you might pass out. During an episode of near-syncope, you may also develop pale skin, have tunnel vision, or feel sick to your stomach (nauseous). Near-syncope may occur when getting up after sitting or while standing for a long time. It is caused by a sudden decrease in blood flow to the brain. This decrease can result from various causes or triggers, most of which are not serious. However, because near-syncope can sometimes be a sign of something serious, a medical evaluation is required. The specific cause is  often not determined. HOME CARE INSTRUCTIONS  Monitor your condition for any changes. The following actions may help to alleviate any discomfort you are experiencing:  Have someone stay with you until you feel stable.  Lie down right away and prop your feet up if you start feeling like you might faint. Breathe deeply and steadily. Wait until all the symptoms have passed. Most of these episodes last only a few minutes. You may feel tired for several hours.   Drink enough fluids to keep your urine clear or pale yellow.   If you are taking blood pressure or heart medicine, get up slowly when seated or lying down. Take several minutes to sit and then stand. This can reduce dizziness.  Follow up with your health care provider as directed. SEEK IMMEDIATE MEDICAL CARE IF:   You have a severe headache.   You have unusual pain in the chest, abdomen, or back.   You are bleeding from the mouth or rectum, or you have black or tarry stool.   You have an irregular or very fast heartbeat.   You have repeated fainting or have seizure-like jerking during an episode.   You faint when sitting or lying down.   You have confusion.   You have difficulty walking.   You have severe weakness.   You have vision problems.  MAKE SURE YOU:   Understand these instructions.  Will watch your condition.  Will get help right away if you are not doing well or get worse.   This information is not intended to replace advice given to you by your health care provider. Make sure you discuss any questions you have with your health care provider.   Document Released: 12/17/2005 Document Revised: 12/22/2013 Document Reviewed: 05/22/2013 Elsevier Interactive Patient Education 2016 [employer].  Fatigue Fatigue is feeling tired all of the time, a lack of energy, or a lack of motivation. Occasional or mild fatigue is often a normal response to activity or life in general. However, long-lasting  (chronic) or extreme fatigue may indicate an underlying medical condition. HOME CARE INSTRUCTIONS  Watch your fatigue for any changes. The following actions may help to lessen any discomfort you are feeling:  Talk to your health care provider about how much sleep you need each night. Try to get the required amount every night.  Take medicines only as directed by your health care provider.  Eat a healthy and nutritious diet. Ask your health care provider if you need help changing your diet.  Drink enough fluid to keep your urine clear or pale yellow.  Practice ways of relaxing, such as yoga, meditation, massage therapy, or acupuncture.  Exercise regularly.   Change situations that cause you stress. Try to keep your work and personal routine reasonable.  Do not abuse illegal drugs.  Limit alcohol intake to no more than 1 drink per day for nonpregnant women and 2 drinks per day for men. One drink equals 12 ounces of beer, 5 ounces of wine, or 1 ounces of hard liquor.  Take a multivitamin, if directed by your health care provider. SEEK MEDICAL CARE IF:   Your fatigue does not get better.  You have a fever.   You have unintentional weight loss or gain.  You have headaches.   You have difficulty:   Falling asleep.  Sleeping throughout the night.  You feel angry, guilty, anxious, or sad.   You are unable to have a bowel movement (constipation).   You skin is dry.   Your legs or another part of your body is swollen.  SEEK IMMEDIATE MEDICAL CARE IF:   You feel confused.   Your vision is blurry.  You feel faint or pass out.   You have a severe headache.   You have severe abdominal, pelvic, or back pain.   You have chest pain, shortness of breath, or an irregular or fast heartbeat.   You are unable to urinate or you urinate less than normal.   You develop abnormal bleeding, such as bleeding from the rectum, vagina, nose, lungs, or nipples.  You  vomit blood.   You have thoughts about harming yourself or committing suicide.   You are worried that you might harm someone else.    This information is not intended to replace advice given to you by your health care provider. Make sure you discuss any questions you have with your health care provider.   Document Released: 10/14/2007 Document Revised: 01/07/2015 Document Reviewed: 04/20/2014 Elsevier Interactive Patient Education [employer]

## 2016-05-21 NOTE — ED Notes (Signed)
Pt verbalized understanding of d/c instructions, prescriptions, and follow-up care. No further questions/concerns, VSS, ambulatory w/ steady gait (refused wheelchair) 

## 2016-05-21 NOTE — ED Notes (Signed)
Decreased potassium rate to 50cc/hr due to pt complaining of pain. Pt reports improvement.

## 2016-05-21 NOTE — ED Provider Notes (Signed)
35 year old female who presents with N/V/D and generalized weakness and body aches for the past several days. PMH significant for Lupus. Pt was seen intially by L Tapia PA-C. K was low and replaced which has improved. Orthostatic vitals are negative. When I walked in the room patient was standing up in the room and ambulating independently around the room. I advised her of her blood work results and asked her to follow up with her PCP. She states she has an appointment in mid-June. She did become tearful stating that her condition has made her miserable due to chronic pain and asked for a steroid burst. I do not think she needs extra steroids at this time because it seems her N/V/D is unrelated to her Lupus. Pt verbalized understanding. Patient is NAD, non-toxic, with stable VS. Patient is informed of clinical course, understands medical decision making process, and agrees with plan. Opportunity for questions provided and all questions answered. Return precautions given.   Evelyn Born, PA-C 05/21/16 1827  Eber Hong, MD 05/22/16 [phone]

## 2016-05-21 NOTE — ED Provider Notes (Signed)
CSN: [id number]     Arrival date & time 05/21/16  1015 History   First MD Initiated Contact with Patient 05/21/16 1124     Chief Complaint  Patient presents with  . Loss of Consciousness  . 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 She states that she has been incontinent of stool multiple times and has been so weak she is unable to get up out of her bed and clean herself up.  She was recently seen in the ER for vomiting and weakness, states that she did not have any further emesis however she did continue to have loose stools. She was also diagnosed with Trichomonas and has been taking Flagyl which she states she has been tolerating without any nausea. She states she has been out to take her prednisone as prescribed. She denies taking any prescription pain medication recently.  Her pain is located all throughout her back, hips and legs, described as severe, without aggravating or alleviating factors. She believes she passed out this morning just prior to arrival in the ER, she denies any head injury. She denies chest pain, shortness of breath, abdominal pain, dysuria, hematuria, vaginal discharge.  Past Medical History  Diagnosis Date  . Headache(784.0)   . Anxiety   . Anemia   . Depression   . Lupus (HCC)   . Lupus nephritis (HCC)   . Pericardial effusion     Intermittent. Associated with lupus.  . Arthritis     rheumatoid  . Osteonecrosis (HCC)     bilateral legs, worse in right hip.  D/t lupus   Past  Surgical History  Procedure Laterality Date  . Dilation and evacuation  11/10  . Laparoscopic tubal ligation  07/10/2011    Procedure: LAPAROSCOPIC TUBAL LIGATION;  Surgeon: Purcell Nails, MD;  Location: [facility];  Service: Gynecology;  Laterality: Bilateral;  fulguration  . Tubal ligation     Family History  Problem Relation Age of Onset  . Hypertension Mother    Social History  Substance Use Topics  . Smoking status: Current Every Day Smoker -- 0.25 packs/day for 7 years    Types: Cigarettes  . Smokeless tobacco: Never Used  . Alcohol Use: 1.8 oz/week    3 Shots of liquor per week   OB History    Gravida Para Term Preterm AB TAB SAB Ectopic Multiple Living   3 1 1  2  2   1      Review of Systems  All other systems reviewed and are negative.     Allergies  Alfalfa; Metoclopramide hcl; Alpha blocker quinazolines; Ciprofloxacin; Cyclobenzaprine; Enoxaparin; Garlic; Nsaids; Pork-derived products; Sulfur; and Tramadol  Home Medications   Prior to Admission medications   Medication Sig Start Date End Date Taking? Authorizing Provider  acetaminophen (TYLENOL) 650 MG CR tablet Take 1,950 mg by mouth every 4 (four) hours as needed for pain.    Yes Historical Provider, MD  cephALEXin (KEFLEX) 500 MG capsule Take 1 capsule (500 mg total) by mouth 4 (four) times daily. 03/25/16  Yes 03/27/16, PA-C  diphenhydrAMINE (BENADRYL) 25 MG tablet Take  1 tablet (25 mg total) by mouth every 6 (six) hours as needed for itching. Patient taking differently: Take 25 mg by mouth daily as needed for itching.  03/25/16  Yes Mady Gemma, PA-C  hydroxychloroquine (PLAQUENIL) 200 MG tablet Take 1 tablet (200 mg total) by mouth 2 (two) times daily. For lupus. 09/17/13  Yes Lloyd Huger T Mashburn, PA-C  metroNIDAZOLE (FLAGYL) 500 MG tablet Take 1 tablet (500 mg total) by mouth 2 (two) times daily. 05/16/16  Yes Danelle Berry, PA-C  ondansetron (ZOFRAN ODT) 4 MG disintegrating tablet Take 1 tablet  (4 mg total) by mouth every 8 (eight) hours as needed for nausea or vomiting. 05/16/16  Yes Danelle Berry, PA-C  oxymorphone (OPANA ER) 10 MG 12 hr tablet Take 20 mg by mouth every 12 (twelve) hours. Reported on 05/16/2016   Yes Historical Provider, MD  predniSONE (DELTASONE) 5 MG tablet Take 7.5 mg by mouth daily with breakfast.   Yes Historical Provider, MD  prochlorperazine (COMPAZINE) 10 MG tablet Take 10 mg by mouth daily as needed for nausea or vomiting (headache).    Yes Historical Provider, MD  venlafaxine XR (EFFEXOR XR) 75 MG 24 hr capsule Take 1 capsule (75 mg total) by mouth daily with breakfast. Patient taking differently: Take 75 mg by mouth daily.  09/28/14  Yes Denton Brick, MD  diphenoxylate-atropine (LOMOTIL) 2.5-0.025 MG per tablet Take 1 tablet by mouth 4 (four) times daily as needed for diarrhea or loose stools. Patient not taking: Reported on 03/25/2016 08/28/15   Donnetta Hutching, MD  oxyCODONE-acetaminophen (PERCOCET) 5-325 MG tablet Take 1-2 tablets by mouth every 6 (six) hours as needed for severe pain. Patient not taking: Reported on 05/16/2016 04/13/16   Danelle Berry, PA-C  predniSONE (DELTASONE) 20 MG tablet Take 2 tablets (40 mg total) by mouth daily. Patient not taking: Reported on 05/16/2016 03/25/16   Mady Gemma, PA-C   BP 103/68 mmHg  Pulse 76  Temp(Src) 98.5 F (36.9 C) (Oral)  Resp 15  SpO2 100% Physical Exam  Constitutional: She is oriented to person, place, and time. She appears well-developed and well-nourished. No distress.  HENT:  Head: Normocephalic and atraumatic.  Nose: Nose normal.  Mouth/Throat: Oropharynx is clear and moist. No oropharyngeal exudate.  Eyes: Conjunctivae and EOM are normal. Pupils are equal, round, and reactive to light. Right eye exhibits no discharge. Left eye exhibits no discharge. No scleral icterus.  Neck: Normal range of motion. No JVD present. No tracheal deviation present. No thyromegaly present.  Cardiovascular: Normal rate,  regular rhythm, normal heart sounds and intact distal pulses.  Exam reveals no gallop and no friction rub.   No murmur heard. Pulmonary/Chest: Effort normal and breath sounds normal. No respiratory distress. She has no wheezes. She has no rales. She exhibits no tenderness.  Abdominal: Soft. Bowel sounds are normal. She exhibits no distension and no mass. There is no tenderness. There is no rebound and no guarding.  Musculoskeletal: Normal range of motion. She exhibits no edema or tenderness.  Muscle compartments of his legs and arms soft and nontender, no joint edema or erythema   Lymphadenopathy:    She has no cervical adenopathy.  Neurological: She is alert and oriented to person, place, and time. She has normal reflexes. No cranial nerve deficit. She exhibits normal muscle tone. Coordination normal.  Skin: Skin is warm and dry. No rash noted. She is not diaphoretic. No erythema. No pallor.  Psychiatric: She has a normal mood and affect. Her  behavior is normal. Judgment and thought content normal.  Nursing note and vitals reviewed.   ED Course  Procedures (including critical care time) Labs Review Labs Reviewed  COMPREHENSIVE METABOLIC PANEL - Abnormal; Notable for the following:    Potassium 2.9 (*)    BUN <5 (*)    ALT 13 (*)    All other components within normal limits  CBC - Abnormal; Notable for the following:    RBC 3.83 (*)    Hemoglobin 10.9 (*)    HCT 35.1 (*)    All other components within normal limits  URINALYSIS, ROUTINE W REFLEX MICROSCOPIC (NOT AT [facility]) - Abnormal; Notable for the following:    Hgb urine dipstick SMALL (*)    Protein, ur 100 (*)    Leukocytes, UA MODERATE (*)    All other components within normal limits  URINE MICROSCOPIC-ADD ON - Abnormal; Notable for the following:    Squamous Epithelial / LPF 6-30 (*)    Bacteria, UA FEW (*)    All other components within normal limits  MAGNESIUM - Abnormal; Notable for the following:    Magnesium 1.6 (*)     All other components within normal limits  LIPASE, BLOOD    Imaging Review No results found. I have personally reviewed and evaluated these images and lab results as part of my medical decision-making.   EKG Interpretation   Date/Time:  Monday May 21 2016 10:24:54 EDT Ventricular Rate:  98 PR Interval:  130 QRS Duration: 72 QT Interval:  346 QTC Calculation: 441 R Axis:   111 Text Interpretation:  Normal sinus rhythm Right atrial enlargement Right  axis deviation Pulmonary disease pattern Nonspecific ST and T wave  abnormality new t wave inversions Abnormal ECG Confirmed by Gerhard Munch  MD [phone]) on 05/21/2016 10:26:30 AM Also confirmed by Gerhard Munch  MD ([id number]), editor Wandalee Ferdinand [phone])  on 05/21/2016 10:46:33  AM      MDM   Pt here with reported syncopal episode secondary to 5 days of diarrhea preceeded by several days of vomiting.  She complains of allover myalgias, pain greater than her chronic pain, and weakness.  She is currenlty takin flagyl for Trichomonas, no vomiting over the past 5 days. She states that she is taking her daily steroids.  Basic labs were obtained which showed hyperkalemia which was replaced with IV potassium.  Urinalysis shows continued presence of Trichomonas, moderate leukocytes.  Magnesium was low, stable anemia.  In the ER patient was given [facility], morphine, Zofran and fluids. She was able to tolerate PO's.  She claimed she was too weak to walk and was intermittently tearful.  Staff and I have observed her ambulating.    Patient was given to Terance Hart PA-C at end of shift with Chem-8 and orthostatics pending (staff did not obtain when ordered several hours ago.)  Plan: Chem 8 pending Orthostatics pending - will need to admit if abnormal If above is normal and pt can walk, can d/c home with follow up with PCP.   Final diagnoses:  Diarrhea, unspecified type  Weakness generalized       Danelle Berry, PA-C 05/27/16  0257  Bethann Berkshire, MD 05/27/16 1016

## 2016-10-03 ENCOUNTER — Inpatient Hospital Stay (HOSPITAL_COMMUNITY)
Admission: EM | Admit: 2016-10-03 | Discharge: 2016-10-08 | DRG: 392 | Disposition: A | Discharge status: Home / Self Care | Payer: Medicaid - Out of State | Attending: Internal Medicine | Admitting: Internal Medicine

## 2016-10-03 ENCOUNTER — Encounter (HOSPITAL_COMMUNITY): Payer: Self-pay | Admitting: Emergency Medicine

## 2016-10-03 ENCOUNTER — Emergency Department (HOSPITAL_COMMUNITY): Payer: Medicaid - Out of State

## 2016-10-03 DIAGNOSIS — E876 Hypokalemia: Secondary | ICD-10-CM | POA: Diagnosis present

## 2016-10-03 DIAGNOSIS — G43A1 Cyclical vomiting, intractable: Secondary | ICD-10-CM | POA: Diagnosis not present

## 2016-10-03 DIAGNOSIS — R634 Abnormal weight loss: Secondary | ICD-10-CM | POA: Diagnosis present

## 2016-10-03 DIAGNOSIS — E860 Dehydration: Secondary | ICD-10-CM | POA: Diagnosis present

## 2016-10-03 DIAGNOSIS — R112 Nausea with vomiting, unspecified: Secondary | ICD-10-CM | POA: Diagnosis present

## 2016-10-03 DIAGNOSIS — M8788 Other osteonecrosis, other site: Secondary | ICD-10-CM | POA: Diagnosis present

## 2016-10-03 DIAGNOSIS — K2950 Unspecified chronic gastritis without bleeding: Secondary | ICD-10-CM | POA: Diagnosis present

## 2016-10-03 DIAGNOSIS — M3214 Glomerular disease in systemic lupus erythematosus: Secondary | ICD-10-CM | POA: Diagnosis present

## 2016-10-03 DIAGNOSIS — F419 Anxiety disorder, unspecified: Secondary | ICD-10-CM | POA: Diagnosis present

## 2016-10-03 DIAGNOSIS — F17210 Nicotine dependence, cigarettes, uncomplicated: Secondary | ICD-10-CM | POA: Diagnosis present

## 2016-10-03 DIAGNOSIS — D638 Anemia in other chronic diseases classified elsewhere: Secondary | ICD-10-CM | POA: Diagnosis present

## 2016-10-03 DIAGNOSIS — F339 Major depressive disorder, recurrent, unspecified: Secondary | ICD-10-CM | POA: Diagnosis present

## 2016-10-03 DIAGNOSIS — K449 Diaphragmatic hernia without obstruction or gangrene: Secondary | ICD-10-CM | POA: Diagnosis present

## 2016-10-03 DIAGNOSIS — Z79891 Long term (current) use of opiate analgesic: Secondary | ICD-10-CM

## 2016-10-03 DIAGNOSIS — Z7952 Long term (current) use of systemic steroids: Secondary | ICD-10-CM

## 2016-10-03 DIAGNOSIS — M069 Rheumatoid arthritis, unspecified: Secondary | ICD-10-CM | POA: Diagnosis present

## 2016-10-03 DIAGNOSIS — K3184 Gastroparesis: Principal | ICD-10-CM | POA: Diagnosis present

## 2016-10-03 LAB — COMPREHENSIVE METABOLIC PANEL
ALK PHOS: 55 U/L (ref 38–126)
ALT: 31 U/L (ref 14–54)
AST: 26 U/L (ref 15–41)
Albumin: 3.5 g/dL (ref 3.5–5.0)
Anion gap: 7 (ref 5–15)
BILIRUBIN TOTAL: 1.1 mg/dL (ref 0.3–1.2)
BUN: 7 mg/dL (ref 6–20)
CALCIUM: 8.5 mg/dL — AB (ref 8.9–10.3)
CHLORIDE: 104 mmol/L (ref 101–111)
CO2: 26 mmol/L (ref 22–32)
CREATININE: 0.64 mg/dL (ref 0.44–1.00)
Glucose, Bld: 127 mg/dL — ABNORMAL HIGH (ref 65–99)
Potassium: 2.7 mmol/L — CL (ref 3.5–5.1)
Sodium: 137 mmol/L (ref 135–145)
Total Protein: 7.3 g/dL (ref 6.5–8.1)

## 2016-10-03 LAB — LIPASE, BLOOD: LIPASE: 32 U/L (ref 11–51)

## 2016-10-03 LAB — URINALYSIS, ROUTINE W REFLEX MICROSCOPIC
BILIRUBIN URINE: NEGATIVE
GLUCOSE, UA: NEGATIVE mg/dL
HGB URINE DIPSTICK: NEGATIVE
Ketones, ur: NEGATIVE mg/dL
Leukocytes, UA: NEGATIVE
Nitrite: NEGATIVE
PROTEIN: NEGATIVE mg/dL
SPECIFIC GRAVITY, URINE: 1.01 (ref 1.005–1.030)
pH: 8 (ref 5.0–8.0)

## 2016-10-03 LAB — CBC WITH DIFFERENTIAL/PLATELET
BASOS ABS: 0 10*3/uL (ref 0.0–0.1)
Basophils Relative: 0 %
Eosinophils Absolute: 0 10*3/uL (ref 0.0–0.7)
Eosinophils Relative: 0 %
HEMATOCRIT: 33.4 % — AB (ref 36.0–46.0)
HEMOGLOBIN: 10.6 g/dL — AB (ref 12.0–15.0)
LYMPHS ABS: 2 10*3/uL (ref 0.7–4.0)
Lymphocytes Relative: 13 %
MCH: 28 pg (ref 26.0–34.0)
MCHC: 31.7 g/dL (ref 30.0–36.0)
MCV: 88.4 fL (ref 78.0–100.0)
Monocytes Absolute: 1.2 10*3/uL — ABNORMAL HIGH (ref 0.1–1.0)
Monocytes Relative: 8 %
NEUTROS ABS: 12.6 10*3/uL — AB (ref 1.7–7.7)
NEUTROS PCT: 79 %
PLATELETS: 253 10*3/uL (ref 150–400)
RBC: 3.78 MIL/uL — AB (ref 3.87–5.11)
RDW: 13.7 % (ref 11.5–15.5)
WBC: 15.8 10*3/uL — AB (ref 4.0–10.5)

## 2016-10-03 LAB — PREGNANCY, URINE: Preg Test, Ur: NEGATIVE

## 2016-10-03 MED ORDER — SODIUM CHLORIDE 0.9 % IV BOLUS (SEPSIS): 1000.0000 mL | Freq: Once | INTRAVENOUS | Status: DC

## 2016-10-03 MED ORDER — HYDROXYCHLOROQUINE SULFATE 200 MG PO TABS
200.0000 mg | ORAL_TABLET | Freq: Every day | ORAL | Status: DC
  Administered 2016-10-04 – 2016-10-08 (×5): 200 mg via ORAL
  Filled 2016-10-03 (×6): qty 1

## 2016-10-03 MED ORDER — POTASSIUM CHLORIDE 10 MEQ/100ML IV SOLN
10.0000 meq | INTRAVENOUS | Status: AC
  Administered 2016-10-03 (×2): 10 meq via INTRAVENOUS
  Filled 2016-10-03 (×2): qty 100

## 2016-10-03 MED ORDER — HYDROXYCHLOROQUINE SULFATE 200 MG PO TABS: 200.0000 mg | ORAL_TABLET | Freq: Two times a day (BID) | ORAL | Status: DC

## 2016-10-03 MED ORDER — ONDANSETRON HCL 4 MG/2ML IJ SOLN
4.0000 mg | Freq: Once | INTRAMUSCULAR | Status: AC
  Administered 2016-10-03: 4 mg via INTRAVENOUS
  Filled 2016-10-03: qty 2

## 2016-10-03 MED ORDER — POTASSIUM CHLORIDE CRYS ER 20 MEQ PO TBCR
40.0000 meq | EXTENDED_RELEASE_TABLET | Freq: Once | ORAL | Status: DC
  Filled 2016-10-03: qty 2

## 2016-10-03 MED ORDER — LIP MEDEX EX OINT
TOPICAL_OINTMENT | CUTANEOUS | Status: AC
  Administered 2016-10-03: 1
  Filled 2016-10-03: qty 7

## 2016-10-03 MED ORDER — SODIUM CHLORIDE 0.9 % IV SOLN: INTRAVENOUS | Status: AC

## 2016-10-03 MED ORDER — HYDROXYCHLOROQUINE SULFATE 200 MG PO TABS
200.0000 mg | ORAL_TABLET | Freq: Two times a day (BID) | ORAL | Status: DC
  Filled 2016-10-03: qty 1

## 2016-10-03 MED ORDER — POTASSIUM CHLORIDE IN NACL 20-0.9 MEQ/L-% IV SOLN
INTRAVENOUS | Status: DC
  Administered 2016-10-03 – 2016-10-08 (×11): via INTRAVENOUS
  Filled 2016-10-03 (×12): qty 1000

## 2016-10-03 MED ORDER — PROMETHAZINE HCL 25 MG/ML IJ SOLN
12.5000 mg | Freq: Four times a day (QID) | INTRAMUSCULAR | Status: DC | PRN
  Administered 2016-10-03 – 2016-10-04 (×2): 12.5 mg via INTRAVENOUS
  Filled 2016-10-03 (×2): qty 1

## 2016-10-03 MED ORDER — HYDROMORPHONE HCL 2 MG/ML IJ SOLN
2.0000 mg | INTRAMUSCULAR | Status: DC | PRN
  Administered 2016-10-03 – 2016-10-05 (×10): 2 mg via INTRAVENOUS
  Filled 2016-10-03 (×10): qty 1

## 2016-10-03 MED ORDER — HYDROMORPHONE HCL 1 MG/ML IJ SOLN
1.0000 mg | Freq: Once | INTRAMUSCULAR | Status: AC
  Administered 2016-10-03: 1 mg via INTRAVENOUS
  Filled 2016-10-03: qty 1

## 2016-10-03 MED ORDER — SODIUM CHLORIDE 0.9 % IV BOLUS (SEPSIS)
1000.0000 mL | Freq: Once | INTRAVENOUS | Status: AC
  Administered 2016-10-03: 1000 mL via INTRAVENOUS

## 2016-10-03 MED ORDER — PREDNISONE 5 MG PO TABS
7.5000 mg | ORAL_TABLET | Freq: Every day | ORAL | Status: DC
  Administered 2016-10-03 – 2016-10-08 (×6): 7.5 mg via ORAL
  Filled 2016-10-03 (×6): qty 2

## 2016-10-03 MED ORDER — PROMETHAZINE HCL 25 MG/ML IJ SOLN
25.0000 mg | Freq: Once | INTRAMUSCULAR | Status: AC
  Administered 2016-10-03: 25 mg via INTRAVENOUS
  Filled 2016-10-03: qty 1

## 2016-10-03 NOTE — H&P (Signed)
[facility] H&P    Patient Demographics:    Evelyn [name], is a 35 y.o. female  MRN: [id number]  DOB - [birth year]  Admit Date - 10/03/2016  Referring MD/NP/PA: Dr. Silverio Lay  Outpatient Primary MD for the patient is No PCP Per Patient  Patient coming from: Home  Chief Complaint  Patient presents with  . Abdominal Pain  . Nausea  . Diarrhea      HPI:    Evelyn [name]  is a 35 y.o. female, With history of lupus nephritis, osteonecrosis bilateral hips, chronic hip pain who presented to the ED visit abdominal pain intractable vomiting and diarrhea. Patient was admitted for similar symptoms at [facility] and was discharged yesterday. Today patient had CT of the abdomen pelvis which was unremarkable in the ED, unable to access records from [facility].  Patient also complains of blood in the vomitus, no melena or rectal bleeding. She also complains of abdominal pain. She denies chest pain, no shortness of breath. No fever or dysuria.    Review of systems:    .  A full 10 point Review of Systems was done, except as stated above, all other Review of Systems were negative.   With Past History of the following :    Past Medical History:  Diagnosis Date  . Anemia   . Anxiety   . Arthritis    rheumatoid  . Depression   . Headache(784.0)   . Lupus   . Lupus nephritis (HCC)   . Osteonecrosis (HCC)    bilateral legs, worse in right hip.  D/t lupus  . Pericardial effusion    Intermittent. Associated with lupus.      Past Surgical History:  Procedure Laterality Date  . DILATION AND EVACUATION  11/10  . LAPAROSCOPIC TUBAL LIGATION  07/10/2011   Procedure: LAPAROSCOPIC TUBAL LIGATION;  Surgeon: Purcell Nails, MD;  Location: [facility];  Service: Gynecology;  Laterality: Bilateral;  fulguration  . TUBAL LIGATION        Social History:      Social History  Substance Use Topics  .  Smoking status: Current Every Day Smoker    Packs/day: 0.25    Years: 7.00    Types: Cigarettes  . Smokeless tobacco: Never Used  . Alcohol use 1.8 oz/week    3 Shots of liquor per week       Family History :     Family History  Problem Relation Age of Onset  . Hypertension Mother       Home Medications:   Prior to Admission medications   Medication Sig Start Date End Date Taking? Authorizing Provider  hydroxychloroquine (PLAQUENIL) 200 MG tablet Take 1 tablet (200 mg total) by mouth 2 (two) times daily. For lupus. 09/17/13  Yes Tamala Julian, PA-C  acetaminophen (TYLENOL) 650 MG CR tablet Take 1,950 mg by mouth every 4 (four) hours as needed for pain.     Historical Provider, MD  cephALEXin (KEFLEX) 500 MG capsule Take 1 capsule (500 mg total) by mouth 4 (four) times daily.  Patient not taking: Reported on 10/03/2016 03/25/16   Mady Gemma, PA-C  diphenoxylate-atropine (LOMOTIL) 2.5-0.025 MG tablet Take 1 tablet by mouth 4 (four) times daily as needed for diarrhea or loose stools. Patient not taking: Reported on 10/03/2016 05/21/16   Danelle Berry, PA-C  Lactobacillus-Inulin (CULTURELLE DIGESTIVE HEALTH) CAPS Take 1 capsule by mouth daily. Patient not taking: Reported on 10/03/2016 05/21/16   Danelle Berry, PA-C  metroNIDAZOLE (FLAGYL) 500 MG tablet Take 1 tablet (500 mg total) by mouth 2 (two) times daily. Patient not taking: Reported on 10/03/2016 05/16/16   Danelle Berry, PA-C  ondansetron (ZOFRAN ODT) 4 MG disintegrating tablet Take 1 tablet (4 mg total) by mouth every 8 (eight) hours as needed for nausea or vomiting. Patient not taking: Reported on 10/03/2016 05/16/16   Danelle Berry, PA-C  oxyCODONE-acetaminophen (PERCOCET) 5-325 MG tablet Take 1-2 tablets by mouth every 6 (six) hours as needed for severe pain. Patient not taking: Reported on 10/03/2016 05/21/16   Danelle Berry, PA-C  oxymorphone (OPANA ER) 10 MG 12 hr tablet Take 20 mg by mouth every 12 (twelve) hours. Reported on  05/16/2016    Historical Provider, MD  predniSONE (DELTASONE) 5 MG tablet Take 7.5 mg by mouth daily with breakfast.    Historical Provider, MD  prochlorperazine (COMPAZINE) 10 MG tablet Take 10 mg by mouth daily as needed for nausea or vomiting (headache).     Historical Provider, MD     Allergies:     Allergies  Allergen Reactions  . Alfalfa Other (See Comments) and Hives  . Metoclopramide Hcl Anaphylaxis    Body started contracting, couldn't breathe, throat swelled up  . Alpha Blocker Quinazolines Hives and Nausea Only  . Aspirin Other (See Comments)    Contraindicated with Lupus  . Cyclobenzaprine Other (See Comments)    Unknown  . Enoxaparin Other (See Comments)    "Spasms of muscles in hands and feet"  . Garlic Diarrhea and Nausea And Vomiting  . Nsaids     Flares up her lupus  . Other Nausea And Vomiting  . Pork-Derived Products Diarrhea and Nausea And Vomiting  . Sulfur Other (See Comments)    Flares up her lupus Flares up her lupus Sulfa.   . Tramadol Other (See Comments)    Lupus flare Drug interaction with kidney medicine  . Ciprofloxacin Diarrhea and Nausea And Vomiting     Physical Exam:   Vitals  Blood pressure 140/92, pulse 84, temperature 97.9 F (36.6 C), temperature source Oral, resp. rate 18, height 5\' 5"  (1.651 m), SpO2 100 %.  1.  General: African-American female in no acute distress  2. Psychiatric:  Intact judgement and  insight, awake alert, oriented x 3.  3. Neurologic: No focal neurological deficits, all cranial nerves intact.Strength 5/5 all 4 extremities, sensation intact all 4 extremities, plantars down going.  4. Eyes :  anicteric sclerae, moist conjunctivae with no lid lag. PERRLA.  5. ENMT:  Oropharynx clear with moist mucous membranes and good dentition  6. Neck:  supple, no cervical lymphadenopathy appriciated, No thyromegaly  7. Respiratory : Normal respiratory effort, good air movement bilaterally,clear to  auscultation  bilaterally  8. Cardiovascular : RRR, no gallops, rubs or murmurs, no leg edema  9. Gastrointestinal:  Positive bowel sounds, abdomen soft, non-tender to palpation,no hepatosplenomegaly, no rigidity or guarding       10. Skin:  No cyanosis, normal texture and turgor, no rash, lesions or ulcers  11.Musculoskeletal:  Good muscle tone,  joints appear  normal , no effusions,  normal range of motion    Data Review:    CBC  Recent Labs Lab 10/03/16 1003  WBC 15.8*  HGB 10.6*  HCT 33.4*  PLT 253  MCV 88.4  MCH 28.0  MCHC 31.7  RDW 13.7  LYMPHSABS 2.0  MONOABS 1.2*  EOSABS 0.0  BASOSABS 0.0   ------------------------------------------------------------------------------------------------------------------  Chemistries   Recent Labs Lab 10/03/16 1003  NA 137  K 2.7*  CL 104  CO2 26  GLUCOSE 127*  BUN 7  CREATININE 0.64  CALCIUM 8.5*  AST 26  ALT 31  ALKPHOS 55  BILITOT 1.1   ------------------------------------------------------------------------------------------------------------------  ------------------------------------------------------------------------------------------------------------------ GFR: CrCl cannot be calculated (Unknown ideal weight.). Liver Function Tests:  Recent Labs Lab 10/03/16 1003  AST 26  ALT 31  ALKPHOS 55  BILITOT 1.1  PROT 7.3  ALBUMIN 3.5    Recent Labs Lab 10/03/16 1003  LIPASE 32     --------------------------------------------------------------------------------------------------------------- Urine analysis:    Component Value Date/Time   COLORURINE YELLOW 10/03/2016 1008   APPEARANCEUR CLEAR 10/03/2016 1008   LABSPEC 1.010 10/03/2016 1008   PHURINE 8.0 10/03/2016 1008   GLUCOSEU NEGATIVE 10/03/2016 1008   HGBUR NEGATIVE 10/03/2016 1008   BILIRUBINUR NEGATIVE 10/03/2016 1008   KETONESUR NEGATIVE 10/03/2016 1008   PROTEINUR NEGATIVE 10/03/2016 1008   UROBILINOGEN 0.2 08/28/2015 1620   NITRITE  NEGATIVE 10/03/2016 1008   LEUKOCYTESUR NEGATIVE 10/03/2016 1008      Imaging Results:    Dg Abd Acute W/chest  Result Date: 10/03/2016 CLINICAL DATA:  Mid abdominal pain with worsening nausea and vomiting today. Recently hospitalized for same symptoms. EXAM: DG ABDOMEN ACUTE W/ 1V CHEST COMPARISON:  Chest radiographs 05/16/2016 and 03/13/2016. Pelvic and left hip radiographs 04/13/2016. FINDINGS: The heart size and mediastinal contours are normal. The lungs are clear. There is no pleural effusion or pneumothorax. No acute osseous findings are identified. Small soft tissue calcification adjacent to the humeral head, suspicious for calcific tendinitis. The abdomen is nearly gasless. There is some gas within the stomach and colon. No evidence of bowel distention, wall thickening or free intraperitoneal air. There is a 4 mm calcification overlying the interpolar region of the left kidney which may reflect a small renal calculus. Bilateral pelvic calcifications are grossly stable. Interval right total hip arthroplasty. Sclerosis of the left femoral head suspicious for avascular necrosis. IMPRESSION: 1. No acute findings demonstrated within the chest, abdomen or pelvis. 2. Suspected nonobstructing left renal calculus. 3. Interval right total hip arthroplasty. 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 Electronically Signed   By: Carey Bullocks M.D.   On: 10/03/2016 11:28      Assessment & Plan:    Active Problems:   Hypokalemia   Intractable nausea and vomiting   1. Intractable nausea and vomiting- likely gastroenteritis versus gastroparesis placed under observation, Phenergan for nausea and vomiting. Patient has allergy to Reglan 2. Hypokalemia- replace potassium and check BMP in a.m. 3. History of lupus-continue prednisone, Plaquenil   DVT Prophylaxis-    SCDs, has allergy to Levaquin  AM Labs Ordered, also please review Full Orders  Family Communication: No family at  bedside  Code Status:  Full code  Admission status: Observation    Time spent in minutes : 60 minutes   Menachem Urbanek S M.D on 10/03/2016 at 1:27 PM  Between 7am to 7pm - Pager - [phone]. After 7pm go to [email or web address] - password [facility]  [facility] - Office  [phone]

## 2016-10-03 NOTE — ED Notes (Signed)
Bed: WA04 Expected date:  Expected time:  Means of arrival:  Comments: EMS-N/V 

## 2016-10-03 NOTE — ED Triage Notes (Signed)
Per EMs patient comes from motel room for abd pain, n/v/d.  Patient was released from [facility] last night where see admitted for couple days for the same symptoms.  Patient was given Zofran 4mg  via IV in route for nausea and dry heaving.

## 2016-10-03 NOTE — ED Notes (Signed)
Made Dr Silverio Lay aware of critical potassium level.
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
## 2016-10-03 NOTE — ED Notes (Signed)
Patient transported to X-ray 

## 2016-10-03 NOTE — ED Notes (Signed)
Called floor to give report, RN wasn't available at this time.

## 2016-10-04 DIAGNOSIS — R45851 Suicidal ideations: Secondary | ICD-10-CM | POA: Diagnosis not present

## 2016-10-04 DIAGNOSIS — E860 Dehydration: Secondary | ICD-10-CM | POA: Diagnosis present

## 2016-10-04 DIAGNOSIS — R112 Nausea with vomiting, unspecified: Secondary | ICD-10-CM

## 2016-10-04 DIAGNOSIS — M069 Rheumatoid arthritis, unspecified: Secondary | ICD-10-CM | POA: Diagnosis present

## 2016-10-04 DIAGNOSIS — G8929 Other chronic pain: Secondary | ICD-10-CM | POA: Diagnosis not present

## 2016-10-04 DIAGNOSIS — D638 Anemia in other chronic diseases classified elsewhere: Secondary | ICD-10-CM | POA: Diagnosis present

## 2016-10-04 DIAGNOSIS — M8788 Other osteonecrosis, other site: Secondary | ICD-10-CM | POA: Diagnosis present

## 2016-10-04 DIAGNOSIS — Z7952 Long term (current) use of systemic steroids: Secondary | ICD-10-CM | POA: Diagnosis not present

## 2016-10-04 DIAGNOSIS — E876 Hypokalemia: Secondary | ICD-10-CM | POA: Diagnosis present

## 2016-10-04 DIAGNOSIS — R634 Abnormal weight loss: Secondary | ICD-10-CM | POA: Diagnosis present

## 2016-10-04 DIAGNOSIS — K2950 Unspecified chronic gastritis without bleeding: Secondary | ICD-10-CM | POA: Diagnosis present

## 2016-10-04 DIAGNOSIS — F17210 Nicotine dependence, cigarettes, uncomplicated: Secondary | ICD-10-CM | POA: Diagnosis present

## 2016-10-04 DIAGNOSIS — F419 Anxiety disorder, unspecified: Secondary | ICD-10-CM | POA: Diagnosis present

## 2016-10-04 DIAGNOSIS — K3184 Gastroparesis: Secondary | ICD-10-CM | POA: Diagnosis present

## 2016-10-04 DIAGNOSIS — F339 Major depressive disorder, recurrent, unspecified: Secondary | ICD-10-CM | POA: Diagnosis present

## 2016-10-04 DIAGNOSIS — Z79891 Long term (current) use of opiate analgesic: Secondary | ICD-10-CM | POA: Diagnosis not present

## 2016-10-04 DIAGNOSIS — R109 Unspecified abdominal pain: Secondary | ICD-10-CM | POA: Diagnosis present

## 2016-10-04 DIAGNOSIS — K449 Diaphragmatic hernia without obstruction or gangrene: Secondary | ICD-10-CM | POA: Diagnosis present

## 2016-10-04 DIAGNOSIS — M3214 Glomerular disease in systemic lupus erythematosus: Secondary | ICD-10-CM | POA: Diagnosis present

## 2016-10-04 LAB — RAPID URINE DRUG SCREEN, HOSP PERFORMED
AMPHETAMINES: NOT DETECTED
BARBITURATES: NOT DETECTED
Benzodiazepines: NOT DETECTED
Cocaine: NOT DETECTED
Opiates: POSITIVE — AB
TETRAHYDROCANNABINOL: POSITIVE — AB

## 2016-10-04 LAB — COMPREHENSIVE METABOLIC PANEL
ALK PHOS: 43 U/L (ref 38–126)
ALT: 22 U/L (ref 14–54)
ANION GAP: 5 (ref 5–15)
AST: 18 U/L (ref 15–41)
Albumin: 3 g/dL — ABNORMAL LOW (ref 3.5–5.0)
BUN: 6 mg/dL (ref 6–20)
CALCIUM: 8.1 mg/dL — AB (ref 8.9–10.3)
CO2: 27 mmol/L (ref 22–32)
CREATININE: 0.62 mg/dL (ref 0.44–1.00)
Chloride: 108 mmol/L (ref 101–111)
Glucose, Bld: 76 mg/dL (ref 65–99)
Potassium: 2.8 mmol/L — ABNORMAL LOW (ref 3.5–5.1)
SODIUM: 140 mmol/L (ref 135–145)
TOTAL PROTEIN: 6.3 g/dL — AB (ref 6.5–8.1)
Total Bilirubin: 0.9 mg/dL (ref 0.3–1.2)

## 2016-10-04 LAB — MAGNESIUM: MAGNESIUM: 1.6 mg/dL — AB (ref 1.7–2.4)

## 2016-10-04 LAB — CBC
HCT: 29.9 % — ABNORMAL LOW (ref 36.0–46.0)
HEMOGLOBIN: 9.4 g/dL — AB (ref 12.0–15.0)
MCH: 28.2 pg (ref 26.0–34.0)
MCHC: 31.4 g/dL (ref 30.0–36.0)
MCV: 89.8 fL (ref 78.0–100.0)
Platelets: 192 10*3/uL (ref 150–400)
RBC: 3.33 MIL/uL — ABNORMAL LOW (ref 3.87–5.11)
RDW: 14 % (ref 11.5–15.5)
WBC: 6.2 10*3/uL (ref 4.0–10.5)

## 2016-10-04 MED ORDER — POTASSIUM CHLORIDE CRYS ER 20 MEQ PO TBCR
40.0000 meq | EXTENDED_RELEASE_TABLET | ORAL | Status: AC
  Administered 2016-10-04 (×2): 40 meq via ORAL
  Filled 2016-10-04 (×2): qty 2

## 2016-10-04 MED ORDER — HYDROMORPHONE HCL 1 MG/ML IJ SOLN: 0.5000 mg | Freq: Once | INTRAMUSCULAR | Status: DC

## 2016-10-04 MED ORDER — ONDANSETRON HCL 4 MG/2ML IJ SOLN
4.0000 mg | Freq: Four times a day (QID) | INTRAMUSCULAR | Status: DC | PRN
  Administered 2016-10-04 – 2016-10-08 (×9): 4 mg via INTRAVENOUS
  Filled 2016-10-04 (×10): qty 2

## 2016-10-04 MED ORDER — MAGNESIUM SULFATE 2 GM/50ML IV SOLN
2.0000 g | Freq: Once | INTRAVENOUS | Status: AC
  Administered 2016-10-04: 2 g via INTRAVENOUS
  Filled 2016-10-04: qty 50

## 2016-10-04 NOTE — Progress Notes (Addendum)
PROGRESS NOTE  Evelyn [name] WYO:[id number] DOB: Jun 02, 1981 DOA: 10/03/2016 PCP: No PCP Per Patient  HPI/Recap of past 24 hours:  Report less abdominal pain, no active n/v. Want to advance diet to full liquid  Assessment/Plan: Active Problems:   Hypokalemia   Intractable nausea and vomiting   1. Intractable nausea and vomiting- patient reported she started to have gi issues since April this year, she was hospitalized several time for the same, infact, she was just discharged from the hospital yesterday and returned with recurrent symptoms. Phenergan for nausea and vomiting. Patient has allergy to Reglan, will get gastric emptying study, patient report feeling better, want to have full liquid diet today.       Urine tox + HTC, cannabinoid hyperemesis is on differential. 2. Hypokalemia- k2.7 on admission, continue replace k, check mag, repeat BMP in a.m. 3. History of lupus-continue prednisone, Plaquenil, all her regular doctors are at [facility]   Code Status: full  Family Communication: patient   Disposition Plan: home in 1-2 days   Consultants:  none  Procedures:  none  Antibiotics:  none   Objective: BP 127/81 (BP Location: Right Arm)   Pulse 69   Temp 98.1 F (36.7 C) (Oral)   Resp 18   Ht 5\' 5"  (1.651 m)   Wt 57.1 kg (125 lb 14.1 oz)   SpO2 100%   BMI 20.95 kg/m   Intake/Output Summary (Last 24 hours) at 10/04/16 0843 Last data filed at 10/04/16 0300  Gross per 24 hour  Intake          1348.33 ml  Output                0 ml  Net          1348.33 ml   Filed Weights   10/03/16 1605  Weight: 57.1 kg (125 lb 14.1 oz)    Exam:   General:  NAD  Cardiovascular: RRR  Respiratory: CTABL  Abdomen: generalized tender, with guarding, no rebound, Soft/ND, positive BS  Musculoskeletal: No Edema  Neuro: aaox3  Data Reviewed: Basic Metabolic Panel:  Recent Labs Lab 10/03/16 1003 10/04/16 0534  NA 137 140  K 2.7* 2.8*  CL 104 108  CO2 26 27    GLUCOSE 127* 76  BUN 7 6  CREATININE 0.64 0.62  CALCIUM 8.5* 8.1*   Liver Function Tests:  Recent Labs Lab 10/03/16 1003 10/04/16 0534  AST 26 18  ALT 31 22  ALKPHOS 55 43  BILITOT 1.1 0.9  PROT 7.3 6.3*  ALBUMIN 3.5 3.0*    Recent Labs Lab 10/03/16 1003  LIPASE 32   No results for input(s): AMMONIA in the last 168 hours. CBC:  Recent Labs Lab 10/03/16 1003 10/04/16 0534  WBC 15.8* 6.2  NEUTROABS 12.6*  --   HGB 10.6* 9.4*  HCT 33.4* 29.9*  MCV 88.4 89.8  PLT 253 192   Cardiac Enzymes:   No results for input(s): CKTOTAL, CKMB, CKMBINDEX, TROPONINI in the last 168 hours. BNP (last 3 results) No results for input(s): BNP in the last 8760 hours.  ProBNP (last 3 results) No results for input(s): PROBNP in the last 8760 hours.  CBG: No results for input(s): GLUCAP in the last 168 hours.  No results found for this or any previous visit (from the past 240 hour(s)).   Studies: Dg Abd Acute W/chest  Result Date: 10/03/2016 CLINICAL DATA:  Mid abdominal pain with worsening nausea and vomiting today. Recently hospitalized for same symptoms.  EXAM: DG ABDOMEN ACUTE W/ 1V CHEST COMPARISON:  Chest radiographs 05/16/2016 and 03/13/2016. Pelvic and left hip radiographs 04/13/2016. FINDINGS: The heart size and mediastinal contours are normal. The lungs are clear. There is no pleural effusion or pneumothorax. No acute osseous findings are identified. Small soft tissue calcification adjacent to the humeral head, suspicious for calcific tendinitis. The abdomen is nearly gasless. There is some gas within the stomach and colon. No evidence of bowel distention, wall thickening or free intraperitoneal air. There is a 4 mm calcification overlying the interpolar region of the left kidney which may reflect a small renal calculus. Bilateral pelvic calcifications are grossly stable. Interval right total hip arthroplasty. Sclerosis of the left femoral head suspicious for avascular necrosis.  IMPRESSION: 1. No acute findings demonstrated within the chest, abdomen or pelvis. 2. Suspected nonobstructing left renal calculus. 3. Interval right total hip arthroplasty. 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 Electronically Signed   By: Carey Bullocks M.D.   On: 10/03/2016 11:28    Scheduled Meds: . sodium chloride   Intravenous STAT  .  HYDROmorphone (DILAUDID) injection  0.5 mg Intravenous Once  . hydroxychloroquine  200 mg Oral Daily  . potassium chloride  40 mEq Oral Once  . potassium chloride  40 mEq Oral Q4H  . predniSONE  7.5 mg Oral Q breakfast    Continuous Infusions: . 0.9 % NaCl with KCl 20 mEq / L 100 mL/hr at 10/04/16 1017     Time spent:  Franky Reier MD, PhD  [facility] Pager [phone]. If 7PM-7AM, please contact night-coverage at [email or web address], password [facility] 10/04/2016, 8:43 AM  LOS: 0 days

## 2016-10-05 ENCOUNTER — Inpatient Hospital Stay (HOSPITAL_COMMUNITY): Payer: Medicaid - Out of State

## 2016-10-05 LAB — BASIC METABOLIC PANEL
ANION GAP: 5 (ref 5–15)
BUN: <5 mg/dL — ABNORMAL LOW (ref 6–20)
CALCIUM: 8.1 mg/dL — AB (ref 8.9–10.3)
CO2: 25 mmol/L (ref 22–32)
CREATININE: 0.48 mg/dL (ref 0.44–1.00)
Chloride: 107 mmol/L (ref 101–111)
Glucose, Bld: 99 mg/dL (ref 65–99)
Potassium: 3.4 mmol/L — ABNORMAL LOW (ref 3.5–5.1)
SODIUM: 137 mmol/L (ref 135–145)

## 2016-10-05 LAB — CBC WITH DIFFERENTIAL/PLATELET
BASOS ABS: 0 10*3/uL (ref 0.0–0.1)
BASOS PCT: 0 %
EOS ABS: 0.1 10*3/uL (ref 0.0–0.7)
Eosinophils Relative: 2 %
HEMATOCRIT: 28.5 % — AB (ref 36.0–46.0)
Hemoglobin: 9 g/dL — ABNORMAL LOW (ref 12.0–15.0)
Lymphocytes Relative: 20 %
Lymphs Abs: 1.3 10*3/uL (ref 0.7–4.0)
MCH: 28 pg (ref 26.0–34.0)
MCHC: 31.6 g/dL (ref 30.0–36.0)
MCV: 88.8 fL (ref 78.0–100.0)
MONO ABS: 0.5 10*3/uL (ref 0.1–1.0)
MONOS PCT: 8 %
NEUTROS ABS: 4.4 10*3/uL (ref 1.7–7.7)
NEUTROS PCT: 70 %
Platelets: 207 10*3/uL (ref 150–400)
RBC: 3.21 MIL/uL — ABNORMAL LOW (ref 3.87–5.11)
RDW: 13.9 % (ref 11.5–15.5)
WBC: 6.3 10*3/uL (ref 4.0–10.5)

## 2016-10-05 LAB — MAGNESIUM: MAGNESIUM: 1.8 mg/dL (ref 1.7–2.4)

## 2016-10-05 MED ORDER — SENNOSIDES-DOCUSATE SODIUM 8.6-50 MG PO TABS
1.0000 | ORAL_TABLET | Freq: Two times a day (BID) | ORAL | Status: DC
  Administered 2016-10-05 – 2016-10-08 (×6): 1 via ORAL
  Filled 2016-10-05 (×6): qty 1

## 2016-10-05 MED ORDER — HYDROMORPHONE HCL 1 MG/ML IJ SOLN
0.5000 mg | INTRAMUSCULAR | Status: DC | PRN
  Administered 2016-10-05 – 2016-10-06 (×3): 0.5 mg via INTRAVENOUS
  Filled 2016-10-05 (×3): qty 1

## 2016-10-05 MED ORDER — ONDANSETRON HCL 4 MG/2ML IJ SOLN
4.0000 mg | Freq: Once | INTRAMUSCULAR | Status: AC
  Administered 2016-10-05: 4 mg via INTRAVENOUS

## 2016-10-05 MED ORDER — HYDROCORTISONE 2.5 % RE CREA
TOPICAL_CREAM | Freq: Three times a day (TID) | RECTAL | Status: DC | PRN
  Administered 2016-10-05: 21:00:00 via TOPICAL
  Filled 2016-10-05: qty 28.35

## 2016-10-05 MED ORDER — TECHNETIUM TC 99M MERTIATIDE
2.0000 | Freq: Once | INTRAVENOUS | Status: AC | PRN
  Administered 2016-10-05: 2 via INTRAVENOUS

## 2016-10-05 NOTE — Progress Notes (Signed)
Medical records received from [facility], and are on chart.

## 2016-10-05 NOTE — Progress Notes (Addendum)
Chaplain paged to visit Ms Evelyn [name] who was reported at being tearful and depressed. Ms Evelyn [name] was weeping when I arrived and began immediately telling of the overwhelming physical pain she feels in digesting food, moving, turning and at times breathing. She reports she is in so much pain that she is unable to sleep. She reports she is at the end of her tolerance and wishes she could die. Within the dimensions of the physical pain she experiences, her Suicidal Ideation (SI) seems to her to be the last resort. She does say that relaxation exercises and mediation have short term benefits to "take a little of the edge off." This "taking the edge off" is the only relief she has experienced in recent history.   Ms Evelyn [name] openly discussed in her fear that speaking of the immense pain she experiences, and asking for relief from this pain, makes her sound like a junkie. She is quick to say that sometimes she feels hospital staff are not hearing her when she speaks of her pain. She is as quick to say that she has not abused drugs, nor is she seeking any sensation from any therapy given her except pain relief from medically diagnosed illnesses. Her perfect world would be eating and not having to deal with immense pain, sleeping a restful sleep, walking and moving without pain, having her attention in things other than her pain. As she loses her sense of ever having such or a little of such, her wish to die increases.  During this visit I lead Ms Evelyn [name] in relaxation exercises which she reported took the edge off. Likely this took the emotional tension away which is a magnifier of her physical pain. It would be advisable for staff to use these exercises and guided meditation to assist how ever little it may help in easing Ms Evelyn [name] pain.  A consult with the room nurse before and after the visit assisting me in directing care to Ms Evelyn [name].  Page the chaplain immediately if Ms Evelyn [name] needs or requests spiritual and  emotional care. A sitter may help in easing her thoughts of death, which increases when she is alone.  Evelyn [name]. Evelyn [name], DMin Chaplain

## 2016-10-05 NOTE — Progress Notes (Addendum)
This RN asked the Chaplin to visit the patient. The Chaplin informed this RN that during their conversation the patient did express some suicidal ideation. This RN further discussed this with the patient, who expressed that she was "fed-up" with being in pain and sick, not necessarily suicidal. I reassured the patient that we are here for her and for her to keep the nurses updated on her mental status and if she develops thoughts of hurting herself. I also explained to the patient the nature of her illness and why it is contraindicated to be on a high dose of pain medicine. This RN discussed other non-pharmacologic methods to control pain. Ordered K pad to help with pain. Consider psych consult. AC and on call MD notified, MD to follow up. Will continue to monitor.

## 2016-10-05 NOTE — Progress Notes (Addendum)
PROGRESS NOTE  Evelyn [name] WCB:[id number] DOB: July 15, 1981 DOA: 10/03/2016 PCP: No PCP Per Patient  HPI/Recap of past 24 hours:  Returned from gastric emptying study  Assessment/Plan: Active Problems:   Hypokalemia   Intractable nausea and vomiting   1. Intractable nausea and vomiting- patient reported she started to have gi issues since April this year, she was hospitalized several time for the same, infact, she was just discharged from the hospital yesterday and returned with recurrent symptoms. Phenergan for nausea and vomiting. Patient has allergy to Reglan,  gastric emptying study positive,        Urine tox + HTC, cannabinoid hyperemesis is on differential.       Will check tsh, patient will need to avoid agent that could slow gi motility. She already has a GI appointment at [facility]. 2. Hypokalemia- k2.7 on admission, better, repeat BMP in a.m. 3. History of lupus-continue prednisone, Plaquenil, all her regular doctors are at [facility]  I have obtained [facility] record, she was hospitalist on 9/30 and discharged on 10/3 for the same,  Due to c/o headache during that hospitalization, MRI brain was done which is unremarkable. CT ab/pel did show non obstructing stone,  And well positioned right hip prosthesis, otherwise unremarkable.  Code Status: full  Family Communication: patient   Disposition Plan: home 10/7   Consultants:  none  Procedures:  Gastric emptying study  Antibiotics:  none   Objective: BP (!) 132/93 (BP Location: Right Arm)   Pulse 64   Temp 98.7 F (37.1 C) (Oral)   Resp 18   Ht 5\' 5"  (1.651 m)   Wt 57.1 kg (125 lb 14.1 oz)   SpO2 100%   BMI 20.95 kg/m   Intake/Output Summary (Last 24 hours) at 10/05/16 1637 Last data filed at 10/05/16 0959  Gross per 24 hour  Intake             2900 ml  Output              100 ml  Net             2800 ml   Filed Weights   10/03/16 1605  Weight: 57.1 kg (125 lb 14.1 oz)     Exam:   General:  NAD  Cardiovascular: RRR  Respiratory: CTABL  Abdomen: generalized tender, with guarding, no rebound, Soft/ND, positive BS  Musculoskeletal: No Edema  Neuro: aaox3  Data Reviewed: Basic Metabolic Panel:  Recent Labs Lab 10/03/16 1003 10/04/16 0534 10/05/16 0546  NA 137 140 137  K 2.7* 2.8* 3.4*  CL 104 108 107  CO2 26 27 25   GLUCOSE 127* 76 99  BUN 7 6 <5*  CREATININE 0.64 0.62 0.48  CALCIUM 8.5* 8.1* 8.1*  MG  --  1.6* 1.8   Liver Function Tests:  Recent Labs Lab 10/03/16 1003 10/04/16 0534  AST 26 18  ALT 31 22  ALKPHOS 55 43  BILITOT 1.1 0.9  PROT 7.3 6.3*  ALBUMIN 3.5 3.0*    Recent Labs Lab 10/03/16 1003  LIPASE 32   No results for input(s): AMMONIA in the last 168 hours. CBC:  Recent Labs Lab 10/03/16 1003 10/04/16 0534 10/05/16 0546  WBC 15.8* 6.2 6.3  NEUTROABS 12.6*  --  4.4  HGB 10.6* 9.4* 9.0*  HCT 33.4* 29.9* 28.5*  MCV 88.4 89.8 88.8  PLT 253 192 207   Cardiac Enzymes:   No results for input(s): CKTOTAL, CKMB, CKMBINDEX, TROPONINI in the last 168 hours.  BNP (last 3 results) No results for input(s): BNP in the last 8760 hours.  ProBNP (last 3 results) No results for input(s): PROBNP in the last 8760 hours.  CBG: No results for input(s): GLUCAP in the last 168 hours.  No results found for this or any previous visit (from the past 240 hour(s)).   Studies: Nm Gastric Emptying  Result Date: 10/05/2016 CLINICAL DATA:  Abdominal pain with nausea and vomiting for 4 months. EXAM: NUCLEAR MEDICINE GASTRIC EMPTYING SCAN TECHNIQUE: After oral ingestion of radiolabeled meal, sequential abdominal images were obtained for 4 hours. Percentage of activity emptying the stomach was calculated at 1 hour, 2 hour, 3 hour, and 4 hours. RADIOPHARMACEUTICALS:  2.0 mCi Tc-28m sulfur colloid in standardized meal COMPARISON:  None. FINDINGS: Expected location of the stomach in the left upper quadrant. Ingested meal empties  the stomach gradually over the course of the study. 9% emptied at 1 hr ( normal >= 10%) 22% emptied at 2 hr ( normal >= 40%) 41% emptied at 3 hr ( normal >= 70%) 57% emptied at 4 hr ( normal >= 90%) IMPRESSION: Delayed gastric emptying study. Electronically Signed   By: Elsie Stain M.D.   On: 10/05/2016 15:51    Scheduled Meds: .  HYDROmorphone (DILAUDID) injection  0.5 mg Intravenous Once  . hydroxychloroquine  200 mg Oral Daily  . potassium chloride  40 mEq Oral Once  . predniSONE  7.5 mg Oral Q breakfast    Continuous Infusions: . 0.9 % NaCl with KCl 20 mEq / L 100 mL/hr at 10/05/16 0416     Time spent:  [name] MD, PhD  [facility] Pager [phone]. If 7PM-7AM, please contact night-coverage at [email or web address], password [facility] 10/05/2016, 4:37 PM  LOS: 1 day

## 2016-10-06 DIAGNOSIS — R45851 Suicidal ideations: Secondary | ICD-10-CM

## 2016-10-06 LAB — BASIC METABOLIC PANEL
ANION GAP: 5 (ref 5–15)
BUN: <5 mg/dL — ABNORMAL LOW (ref 6–20)
CALCIUM: 8.6 mg/dL — AB (ref 8.9–10.3)
CO2: 27 mmol/L (ref 22–32)
Chloride: 105 mmol/L (ref 101–111)
Creatinine, Ser: 0.6 mg/dL (ref 0.44–1.00)
GFR calc Af Amer: >60 mL/min (ref >60)
Glucose, Bld: 100 mg/dL — ABNORMAL HIGH (ref 65–99)
POTASSIUM: 3.5 mmol/L (ref 3.5–5.1)
SODIUM: 137 mmol/L (ref 135–145)

## 2016-10-06 LAB — MAGNESIUM: MAGNESIUM: 1.6 mg/dL — AB (ref 1.7–2.4)

## 2016-10-06 LAB — TSH: TSH: 0.679 u[IU]/mL (ref 0.350–4.500)

## 2016-10-06 MED ORDER — MORPHINE SULFATE (PF) 2 MG/ML IV SOLN
1.0000 mg | Freq: Three times a day (TID) | INTRAVENOUS | Status: DC | PRN
  Administered 2016-10-06 – 2016-10-07 (×2): 1 mg via INTRAVENOUS
  Filled 2016-10-06 (×2): qty 1

## 2016-10-06 MED ORDER — FAMOTIDINE IN NACL 20-0.9 MG/50ML-% IV SOLN
20.0000 mg | Freq: Two times a day (BID) | INTRAVENOUS | Status: DC
  Administered 2016-10-06 – 2016-10-08 (×5): 20 mg via INTRAVENOUS
  Filled 2016-10-06 (×5): qty 50

## 2016-10-06 MED ORDER — POTASSIUM CHLORIDE CRYS ER 20 MEQ PO TBCR
40.0000 meq | EXTENDED_RELEASE_TABLET | Freq: Once | ORAL | Status: AC
  Administered 2016-10-06: 40 meq via ORAL
  Filled 2016-10-06: qty 2

## 2016-10-06 MED ORDER — ACETAMINOPHEN 325 MG PO TABS
650.0000 mg | ORAL_TABLET | Freq: Four times a day (QID) | ORAL | Status: DC | PRN
  Administered 2016-10-06 – 2016-10-08 (×6): 650 mg via ORAL
  Filled 2016-10-06 (×6): qty 2

## 2016-10-06 MED ORDER — HYDROMORPHONE HCL 1 MG/ML IJ SOLN: 0.5000 mg | Freq: Four times a day (QID) | INTRAMUSCULAR | Status: DC | PRN

## 2016-10-06 MED ORDER — MAGNESIUM SULFATE 2 GM/50ML IV SOLN
2.0000 g | Freq: Once | INTRAVENOUS | Status: AC
  Administered 2016-10-06: 2 g via INTRAVENOUS
  Filled 2016-10-06: qty 50

## 2016-10-06 MED ORDER — SODIUM CHLORIDE 0.9 % IV SOLN: INTRAVENOUS | Status: DC

## 2016-10-06 MED ORDER — POLYETHYLENE GLYCOL 3350 17 G PO PACK
17.0000 g | PACK | Freq: Every day | ORAL | Status: DC
  Administered 2016-10-06 – 2016-10-08 (×3): 17 g via ORAL
  Filled 2016-10-06 (×3): qty 1

## 2016-10-06 MED ORDER — HYDROMORPHONE HCL 1 MG/ML IJ SOLN
0.5000 mg | Freq: Once | INTRAMUSCULAR | Status: AC
  Administered 2016-10-06: 0.5 mg via INTRAVENOUS
  Filled 2016-10-06: qty 1

## 2016-10-06 NOTE — Progress Notes (Signed)
Patient said she was in pain. Will notified nurse of BP

## 2016-10-06 NOTE — Progress Notes (Addendum)
Chaplain follow-up visit during regular rounding. Evelyn [name] had less panic this morning. She indicated that she was being considered for discharge and indicated that she would likely be back in a hospital shortly thereafter. She reported that distracting activities such as playing games on her phone keeps her mind off her pain. Last night she described her pain as 10 on a scale where 1 is no pain and 10 is the most intense pain possible. Today her pain was lessened, with no number attributed. She did not speak of wishing to die this morning.  The chaplain spoke with Dr. Albertine Grates and room nurse, Donetta Potts, about what he learned and heard during this visit. Given the amount of hospital hopping and various other social issues that seem to be present in Evelyn [name]' life, it is probable that she is coming to different hospitals as a way of seeking out relief from psycho-social and spiritual pains not connected to her physical pain. It is recommended that, if considered of benefit by other care providers, that a social work consult to deal directly with her psycho-social distress be requested.  This morning the suicidal thoughts she voiced last night were not present. Although it can not be ruled out that she may harbor such thoughts, it is more likely that today she is coping with the realities of her situation differently than last night. More conversations with Evelyn [name] are needed to explore the possibility that her fear of being alone is a root cause of some of her spiritual and psycho-social behaviors.  Page the chaplain immediately if Evelyn [name] needs or requests further Spiritual or Emotional care.  Benjie Karvonen. [place] Vierling, DMin Weekend [address]

## 2016-10-06 NOTE — Progress Notes (Addendum)
PROGRESS NOTE  Evelyn [name] JJH:[id number] DOB: 1981/01/10 DOA: 10/03/2016 PCP: No PCP Per Patient  HPI/Recap of past 24 hours:    Assessment/Plan: Active Problems:   Hypokalemia   Intractable nausea and vomiting   1. Intractable nausea and vomiting, abdominal pain- patient reported she started to have gi issues since April this year, she was hospitalized several time for the same, infact, she was just discharged from the hospital yesterday and returned with recurrent symptoms.        Recent CT ab/pel as mentioned below, kub during this hospitalization unremarkable.        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 She is advised to take small meals and multiple time a day, she is currently on full liquid diet, she request to advance diet, but continue to c/o n/v and abdominal pain and request to be evaluated by GI, I advised her , I will consult GI and GI will give recommendation regarding diet advancement.       Urine tox + HTC, cannabinoid hyperemesis is on differential.       tsh 0.67,       patient will need to avoid agent that could slow gi motility. She already has a GI appointment at [facility]. But she requested to see GI here due to persistent n/v/ab pain. [facility] paged.  2. Hypokalemia/hypomagnesmia- k2.7 on admission, replace , keep k>4, mag>2.  3. History of lupus-continue prednisone, Plaquenil, all her regular doctors are at [facility] 4. Anemia: stable at baseline, mcv wnl, likely anemia of chronic disease, will check iron panel /b12/folate 5. Suicidal ideation and depression: psych consulted    I have obtained [facility] record, she was hospitalist on 9/30 and discharged on 10/3 for the same,  Due to c/o headache during that hospitalization, MRI brain was done which is  unremarkable. CT ab/pel did show non obstructing stone,  And well positioned right hip prosthesis, otherwise unremarkable.  Code Status: full  Family Communication: patient   Disposition Plan: pending   Consultants:  GI  psych  Procedures:  Gastric emptying study on 10/6  Antibiotics:  none   Objective: BP 118/84 (BP Location: Right Arm)   Pulse 86   Temp 98.8 F (37.1 C) (Oral)   Resp 16   Ht 5\' 5"  (1.651 m)   Wt 57.1 kg (125 lb 14.1 oz)   SpO2 98%   BMI 20.95 kg/m   Intake/Output Summary (Last 24 hours) at 10/06/16 1058 Last data filed at 10/06/16 0900  Gross per 24 hour  Intake             2770 ml  Output              300 ml  Net             2470 ml   Filed Weights   10/03/16 1605  Weight: 57.1 kg (125 lb 14.1 oz)    Exam:   General:  NAD  Cardiovascular: RRR  Respiratory: CTABL  Abdomen: generalized tender, with guarding, no rebound, Soft/ND, positive BS  Musculoskeletal: No Edema  Neuro: aaox3  Data Reviewed: Basic Metabolic Panel:  Recent Labs Lab 10/03/16 1003 10/04/16 0534 10/05/16 0546 10/06/16 0630  NA 137 140 137 137  K 2.7* 2.8* 3.4*  3.5  CL 104 108 107 105  CO2 26 27 25 27   GLUCOSE 127* 76 99 100*  BUN 7 6 <5* <5*  CREATININE 0.64 0.62 0.48 0.60  CALCIUM 8.5* 8.1* 8.1* 8.6*  MG  --  1.6* 1.8 1.6*   Liver Function Tests:  Recent Labs Lab 10/03/16 1003 10/04/16 0534  AST 26 18  ALT 31 22  ALKPHOS 55 43  BILITOT 1.1 0.9  PROT 7.3 6.3*  ALBUMIN 3.5 3.0*    Recent Labs Lab 10/03/16 1003  LIPASE 32   No results for input(s): AMMONIA in the last 168 hours. CBC:  Recent Labs Lab 10/03/16 1003 10/04/16 0534 10/05/16 0546  WBC 15.8* 6.2 6.3  NEUTROABS 12.6*  --  4.4  HGB 10.6* 9.4* 9.0*  HCT 33.4* 29.9* 28.5*  MCV 88.4 89.8 88.8  PLT 253 192 207   Cardiac Enzymes:   No results for input(s): CKTOTAL, CKMB, CKMBINDEX, TROPONINI in the last 168 hours. BNP (last 3 results) No results for input(s):  BNP in the last 8760 hours.  ProBNP (last 3 results) No results for input(s): PROBNP in the last 8760 hours.  CBG: No results for input(s): GLUCAP in the last 168 hours.  No results found for this or any previous visit (from the past 240 hour(s)).   Studies: Nm Gastric Emptying  Result Date: 10/05/2016 CLINICAL DATA:  Abdominal pain with nausea and vomiting for 4 months. EXAM: NUCLEAR MEDICINE GASTRIC EMPTYING SCAN TECHNIQUE: After oral ingestion of radiolabeled meal, sequential abdominal images were obtained for 4 hours. Percentage of activity emptying the stomach was calculated at 1 hour, 2 hour, 3 hour, and 4 hours. RADIOPHARMACEUTICALS:  2.0 mCi Tc-50m sulfur colloid in standardized meal COMPARISON:  None. FINDINGS: Expected location of the stomach in the left upper quadrant. Ingested meal empties the stomach gradually over the course of the study. 9% emptied at 1 hr ( normal >= 10%) 22% emptied at 2 hr ( normal >= 40%) 41% emptied at 3 hr ( normal >= 70%) 57% emptied at 4 hr ( normal >= 90%) IMPRESSION: Delayed gastric emptying study. Electronically Signed   By: 84m M.D.   On: 10/05/2016 15:51    Scheduled Meds: .  HYDROmorphone (DILAUDID) injection  0.5 mg Intravenous Once  . hydroxychloroquine  200 mg Oral Daily  . magnesium sulfate 1 - 4 g bolus IVPB  2 g Intravenous Once  . polyethylene glycol  17 g Oral Daily  . potassium chloride  40 mEq Oral Once  . potassium chloride  40 mEq Oral Once  . predniSONE  7.5 mg Oral Q breakfast  . senna-docusate  1 tablet Oral BID    Continuous Infusions: . 0.9 % NaCl with KCl 20 mEq / L 100 mL/hr at 10/06/16 0600     Time spent: 12/06/16  Eligah Anello MD, PhD  [facility] Pager [phone]. If 7PM-7AM, please contact night-coverage at [email or web address], password [facility] 10/06/2016, 10:58 AM  LOS: 2 days

## 2016-10-06 NOTE — Consult Note (Signed)
[facility] Consult Note  Referring Provider: No ref. provider found Primary Care Physician:  No PCP Per Patient Primary Gastroenterologist:  Dr.  Laurel Dimmer Complaint: Vomiting and abdominal pain HPI: Evelyn [name] is an 35 y.o. black female  history of lupus who presents with reported 1 month history of epigastric pain nausea vomiting and it 20 pound weight loss. She was recently admitted an outlying hospital with apparently negative workup including CT scan. She had a nuclear medicine gastric emptying scan here which showed delay at 134 hours. She states she had an EGD in 2011 was difficult to sedate. She was apparent started on Reglan which she did not tolerate secondary to extrapyramidal symptoms. She denies nonsteroidal anti-inflammatory drug use.  Past Medical History:  Diagnosis Date  . Anemia   . Anxiety   . Arthritis    rheumatoid  . Depression   . Headache(784.0)   . Lupus   . Lupus nephritis ([place])   . Osteonecrosis ([place])    bilateral legs, worse in right hip.  D/t lupus  . Pericardial effusion    Intermittent. Associated with lupus.    Past Surgical History:  Procedure Laterality Date  . DILATION AND EVACUATION  11/10  . LAPAROSCOPIC TUBAL LIGATION  07/10/2011   Procedure: LAPAROSCOPIC TUBAL LIGATION;  Surgeon: Delice Lesch, MD;  Location: [facility];  Service: Gynecology;  Laterality: Bilateral;  fulguration  . TUBAL LIGATION      Medications Prior to Admission  Medication Sig Dispense Refill  . acetaminophen (TYLENOL) 500 MG tablet Take 1,000 mg by mouth every 4 (four) hours as needed for moderate pain.    Marland Kitchen HYDROmorphone (DILAUDID) 2 MG tablet Take 2 mg by mouth every 6 (six) hours as needed for severe pain.    . hydroxychloroquine (PLAQUENIL) 200 MG tablet Take 1 tablet (200 mg total) by mouth 2 (two) times daily. For lupus.    . predniSONE (DELTASONE) 5 MG tablet Take 7.5 mg by mouth daily with breakfast.    . promethazine (PHENERGAN) 50 MG tablet  Take 50 mg by mouth every 6 (six) hours as needed for nausea or vomiting.      Allergies:  Allergies  Allergen Reactions  . Alfalfa Other (See Comments) and Hives  . Metoclopramide Hcl Anaphylaxis    Body started contracting, couldn't breathe, throat swelled up  . Alpha Blocker Quinazolines Hives and Nausea Only  . Aspirin Other (See Comments)    Contraindicated with Lupus  . Cyclobenzaprine Other (See Comments)    Unknown  . Enoxaparin Other (See Comments)    "Spasms of muscles in hands and feet"  . Garlic Diarrhea and Nausea And Vomiting  . Nsaids     Flares up her lupus  . Other Nausea And Vomiting  . Pork-Derived Products Diarrhea and Nausea And Vomiting  . Sulfur Other (See Comments)    Flares up her lupus Flares up her lupus Sulfa.   . Tramadol Other (See Comments)    Lupus flare Drug interaction with kidney medicine  . Ciprofloxacin Diarrhea and Nausea And Vomiting    Family History  Problem Relation Age of Onset  . Hypertension Mother     Social History:  reports that she has been smoking Cigarettes.  She has a 1.75 pack-year smoking history. She has never used smokeless tobacco. She reports that she drinks about 1.8 oz of alcohol per week . She reports that she uses drugs, including Marijuana, about 14 times per week.  Review of Systems: negative except As  above   Blood pressure 118/84, pulse 86, temperature 98.8 F (37.1 C), temperature source Oral, resp. rate 16, height '5\' 5"'  (1.651 m), weight 57.1 kg (125 lb 14.1 oz), SpO2 98 %. Head: Normocephalic, without obvious abnormality, atraumatic Neck: no adenopathy, no carotid bruit, no JVD, supple, symmetrical, trachea midline and thyroid not enlarged, symmetric, no tenderness/mass/nodules Resp: clear to auscultation bilaterally Cardio: regular rate and rhythm, S1, S2 normal, no murmur, click, rub or gallop GI: And soft slightly distended moderately tender hepatosplenomegaly mass or guarding Extremities:  extremities normal, atraumatic, no cyanosis or edema  Results for orders placed or performed during the hospital encounter of 10/03/16 (from the past 48 hour(s))  CBC with Differential/Platelet     Status: Abnormal   Collection Time: 10/05/16  5:46 AM  Result Value Ref Range   WBC 6.3 4.0 - 10.5 K/uL   RBC 3.21 (L) 3.87 - 5.11 MIL/uL   Hemoglobin 9.0 (L) 12.0 - 15.0 g/dL   HCT 28.5 (L) 36.0 - 46.0 %   MCV 88.8 78.0 - 100.0 fL   MCH 28.0 26.0 - 34.0 pg   MCHC 31.6 30.0 - 36.0 g/dL   RDW 13.9 11.5 - 15.5 %   Platelets 207 150 - 400 K/uL   Neutrophils Relative % 70 %   Neutro Abs 4.4 1.7 - 7.7 K/uL   Lymphocytes Relative 20 %   Lymphs Abs 1.3 0.7 - 4.0 K/uL   Monocytes Relative 8 %   Monocytes Absolute 0.5 0.1 - 1.0 K/uL   Eosinophils Relative 2 %   Eosinophils Absolute 0.1 0.0 - 0.7 K/uL   Basophils Relative 0 %   Basophils Absolute 0.0 0.0 - 0.1 K/uL  Basic metabolic panel     Status: Abnormal   Collection Time: 10/05/16  5:46 AM  Result Value Ref Range   Sodium 137 135 - 145 mmol/L   Potassium 3.4 (L) 3.5 - 5.1 mmol/L    Comment: DELTA CHECK NOTED REPEATED TO VERIFY NO VISIBLE HEMOLYSIS    Chloride 107 101 - 111 mmol/L   CO2 25 22 - 32 mmol/L   Glucose, Bld 99 65 - 99 mg/dL   BUN <5 (L) 6 - 20 mg/dL   Creatinine, Ser 0.48 0.44 - 1.00 mg/dL   Calcium 8.1 (L) 8.9 - 10.3 mg/dL   GFR calc non Af Amer >60 >60 mL/min   GFR calc Af Amer >60 >60 mL/min    Comment: (NOTE) The eGFR has been calculated using the CKD EPI equation. This calculation has not been validated in all clinical situations. eGFR's persistently <60 mL/min signify possible Chronic Kidney Disease.    Anion gap 5 5 - 15  Magnesium     Status: None   Collection Time: 10/05/16  5:46 AM  Result Value Ref Range   Magnesium 1.8 1.7 - 2.4 mg/dL  Basic metabolic panel     Status: Abnormal   Collection Time: 10/06/16  6:30 AM  Result Value Ref Range   Sodium 137 135 - 145 mmol/L   Potassium 3.5 3.5 - 5.1  mmol/L   Chloride 105 101 - 111 mmol/L   CO2 27 22 - 32 mmol/L   Glucose, Bld 100 (H) 65 - 99 mg/dL   BUN <5 (L) 6 - 20 mg/dL   Creatinine, Ser 0.60 0.44 - 1.00 mg/dL   Calcium 8.6 (L) 8.9 - 10.3 mg/dL   GFR calc non Af Amer >60 >60 mL/min   GFR calc Af Amer >60 >60 mL/min  Comment: (NOTE) The eGFR has been calculated using the CKD EPI equation. This calculation has not been validated in all clinical situations. eGFR's persistently <60 mL/min signify possible Chronic Kidney Disease.    Anion gap 5 5 - 15  Magnesium     Status: Abnormal   Collection Time: 10/06/16  6:30 AM  Result Value Ref Range   Magnesium 1.6 (L) 1.7 - 2.4 mg/dL  TSH     Status: None   Collection Time: 10/06/16  6:30 AM  Result Value Ref Range   TSH 0.679 0.350 - 4.500 uIU/mL    Comment: Performed by a 3rd Generation assay with a functional sensitivity of <=0.01 uIU/mL.   Nm Gastric Emptying  Result Date: 10/05/2016 CLINICAL DATA:  Abdominal pain with nausea and vomiting for 4 months. EXAM: NUCLEAR MEDICINE GASTRIC EMPTYING SCAN TECHNIQUE: After oral ingestion of radiolabeled meal, sequential abdominal images were obtained for 4 hours. Percentage of activity emptying the stomach was calculated at 1 hour, 2 hour, 3 hour, and 4 hours. RADIOPHARMACEUTICALS:  2.0 mCi Tc-70msulfur colloid in standardized meal COMPARISON:  None. FINDINGS: Expected location of the stomach in the left upper quadrant. Ingested meal empties the stomach gradually over the course of the study. 9% emptied at 1 hr ( normal >= 10%) 22% emptied at 2 hr ( normal >= 40%) 41% emptied at 3 hr ( normal >= 70%) 57% emptied at 4 hr ( normal >= 90%) IMPRESSION: Delayed gastric emptying study. Electronically Signed   By: JStaci RighterM.D.   On: 10/05/2016 15:51    Assessment: Epigastric pain nausea vomiting weight loss associated with demonstrable delayed gastric emptying, Intolerance to Reglan Plan:  Will proceed with EGD tomorrow to look for any  identifiable/correctable causes of delayed gastric emptying. Maigan Bittinger C 10/06/2016, 11:34 AM  Pager [phone] no answer or after 5 PM call [phone]

## 2016-10-06 NOTE — Progress Notes (Addendum)
Callback from on call MD. Advised of patient status, vitals and complaints. MD states will place order for Morphine every 8 hours prn only due to previous notes about slowed motility and that the narcotic medication will not help with this issue. Patient advised, order noted. Will continue to monitor. Sitter at bedside.

## 2016-10-06 NOTE — Progress Notes (Signed)
Patient requested popsicle and appears to be resting more comfortably. States pain is better although not completely gone. Will continue to monitor. Sitter at bedside.

## 2016-10-06 NOTE — Progress Notes (Addendum)
Patient c/o headache and abd pain. Patient is crying uncontrollably, BP elevated at 148/121 via automatic BP machine, but patient moving back and forth in bed and not allowing cuff to operate correctly. Patient screaming and will not allow nurse to attempt manual BP. Patient states Tylenol is not going to help and wants pain medication to help with pain. Patient advised that MD will be paged because no other pain medication is ordered since pain meds d/ced earlier due to slow motility per MD notes. Patient screaming she needs something else and screams to stop talking to her. On call MD paged, awaiting callback for further orders. Sitter at bedside

## 2016-10-06 NOTE — Consult Note (Signed)
Patient refused to be interviewed, states "I do not wants to talk to a psychiatrist.'' I talked to Dr. Roda Shutters who agreed that patient can be seen tomorrow.

## 2016-10-06 NOTE — Progress Notes (Signed)
Patient BP checked manually 122/64. Patient given MSO4 per orders. Resting quietly in bed. Will continue to monitor. Sitter at bedside.

## 2016-10-07 ENCOUNTER — Encounter (HOSPITAL_COMMUNITY)
Admission: EM | Disposition: A | Discharge status: Home / Self Care | Payer: Self-pay | Source: Home / Self Care | Attending: Internal Medicine

## 2016-10-07 ENCOUNTER — Inpatient Hospital Stay (HOSPITAL_COMMUNITY): Payer: Medicaid - Out of State | Admitting: Anesthesiology

## 2016-10-07 ENCOUNTER — Encounter (HOSPITAL_COMMUNITY): Payer: Self-pay | Admitting: *Deleted

## 2016-10-07 DIAGNOSIS — F331 Major depressive disorder, recurrent, moderate: Secondary | ICD-10-CM

## 2016-10-07 DIAGNOSIS — Z888 Allergy status to other drugs, medicaments and biological substances status: Secondary | ICD-10-CM

## 2016-10-07 DIAGNOSIS — Z79899 Other long term (current) drug therapy: Secondary | ICD-10-CM

## 2016-10-07 DIAGNOSIS — F339 Major depressive disorder, recurrent, unspecified: Secondary | ICD-10-CM

## 2016-10-07 DIAGNOSIS — F17210 Nicotine dependence, cigarettes, uncomplicated: Secondary | ICD-10-CM

## 2016-10-07 DIAGNOSIS — Z8249 Family history of ischemic heart disease and other diseases of the circulatory system: Secondary | ICD-10-CM

## 2016-10-07 HISTORY — PX: ESOPHAGOGASTRODUODENOSCOPY: SHX5428

## 2016-10-07 LAB — BASIC METABOLIC PANEL
ANION GAP: 8 (ref 5–15)
BUN: <5 mg/dL — ABNORMAL LOW (ref 6–20)
CALCIUM: 8.8 mg/dL — AB (ref 8.9–10.3)
CO2: 26 mmol/L (ref 22–32)
Chloride: 106 mmol/L (ref 101–111)
Creatinine, Ser: 0.55 mg/dL (ref 0.44–1.00)
Glucose, Bld: 84 mg/dL (ref 65–99)
POTASSIUM: 3.9 mmol/L (ref 3.5–5.1)
Sodium: 140 mmol/L (ref 135–145)

## 2016-10-07 LAB — VITAMIN B12: Vitamin B-12: 291 pg/mL (ref 180–914)

## 2016-10-07 LAB — IRON AND TIBC
Iron: 32 ug/dL (ref 28–170)
SATURATION RATIOS: 13 % (ref 10.4–31.8)
TIBC: 241 ug/dL — ABNORMAL LOW (ref 250–450)
UIBC: 209 ug/dL

## 2016-10-07 LAB — MAGNESIUM: MAGNESIUM: 1.9 mg/dL (ref 1.7–2.4)

## 2016-10-07 LAB — PREALBUMIN: PREALBUMIN: 21.7 mg/dL (ref 18–38)

## 2016-10-07 SURGERY — EGD (ESOPHAGOGASTRODUODENOSCOPY)
Anesthesia: Monitor Anesthesia Care | Laterality: Left

## 2016-10-07 MED ORDER — LIDOCAINE 2% (20 MG/ML) 5 ML SYRINGE
INTRAMUSCULAR | Status: AC
Start: 2016-10-07 — End: 2016-10-07
  Filled 2016-10-07: qty 5

## 2016-10-07 MED ORDER — BUPROPION HCL ER (XL) 150 MG PO TB24
150.0000 mg | ORAL_TABLET | Freq: Every day | ORAL | Status: DC
  Administered 2016-10-08: 150 mg via ORAL
  Filled 2016-10-07: qty 1

## 2016-10-07 MED ORDER — PROCHLORPERAZINE EDISYLATE 5 MG/ML IJ SOLN
5.0000 mg | Freq: Once | INTRAMUSCULAR | Status: AC
  Administered 2016-10-07: 5 mg via INTRAVENOUS
  Filled 2016-10-07: qty 2

## 2016-10-07 MED ORDER — ONDANSETRON HCL 4 MG/2ML IJ SOLN
INTRAMUSCULAR | Status: AC
  Filled 2016-10-07: qty 2

## 2016-10-07 MED ORDER — PROPOFOL 10 MG/ML IV BOLUS
INTRAVENOUS | Status: DC | PRN
  Administered 2016-10-07 (×5): 20 mg via INTRAVENOUS

## 2016-10-07 MED ORDER — LIDOCAINE 2% (20 MG/ML) 5 ML SYRINGE
INTRAMUSCULAR | Status: DC | PRN
  Administered 2016-10-07: 50 mg via INTRAVENOUS

## 2016-10-07 MED ORDER — PROCHLORPERAZINE EDISYLATE 5 MG/ML IJ SOLN
5.0000 mg | Freq: Once | INTRAMUSCULAR | Status: DC
  Filled 2016-10-07: qty 2

## 2016-10-07 MED ORDER — DIPHENHYDRAMINE HCL 50 MG/ML IJ SOLN
12.5000 mg | Freq: Once | INTRAMUSCULAR | Status: AC
  Administered 2016-10-07: 12.5 mg via INTRAVENOUS
  Filled 2016-10-07: qty 1

## 2016-10-07 MED ORDER — PROPOFOL 10 MG/ML IV BOLUS
INTRAVENOUS | Status: AC
  Filled 2016-10-07: qty 40

## 2016-10-07 MED ORDER — PROPOFOL 10 MG/ML IV BOLUS
INTRAVENOUS | Status: AC
  Filled 2016-10-07: qty 20

## 2016-10-07 MED ORDER — LACTATED RINGERS IV SOLN
INTRAVENOUS | Status: DC
  Administered 2016-10-07: 12:00:00 via INTRAVENOUS

## 2016-10-07 MED ORDER — PANTOPRAZOLE SODIUM 40 MG PO TBEC
40.0000 mg | DELAYED_RELEASE_TABLET | Freq: Two times a day (BID) | ORAL | Status: DC
  Administered 2016-10-07 – 2016-10-08 (×2): 40 mg via ORAL
  Filled 2016-10-07 (×2): qty 1

## 2016-10-07 MED ORDER — TRAZODONE HCL 50 MG PO TABS
50.0000 mg | ORAL_TABLET | Freq: Once | ORAL | Status: AC
  Administered 2016-10-07: 50 mg via ORAL
  Filled 2016-10-07: qty 1

## 2016-10-07 MED ORDER — DIPHENHYDRAMINE HCL 50 MG/ML IJ SOLN
12.5000 mg | Freq: Once | INTRAMUSCULAR | Status: DC
  Filled 2016-10-07: qty 1

## 2016-10-07 MED ORDER — PROPOFOL 500 MG/50ML IV EMUL
INTRAVENOUS | Status: DC | PRN
  Administered 2016-10-07: 125 ug/kg/min via INTRAVENOUS

## 2016-10-07 NOTE — Progress Notes (Signed)
Patient complaining of abdominal pain nausea and headache which she attributes a lot of but still wants to eat. EGD shows small sliding hiatal hernia mild antral gastritis, no retained food in the stomach and no other abnormalities. CLOtest obtained. She is intolerant to Reglan and will need instruction and gastroparesis diet, treatment with a PPI and treatment for eradication of Helicobacter if positive. Domperidone could be tried if obtainable. We'll continue to follow.

## 2016-10-07 NOTE — Progress Notes (Signed)
Patient given Trazodone per MD order. Advised that MD ordered as one time dose for tonight. Patient states probably will not help because she usually has to take Trazodone and Ambien in hospital to sleep. Encouraged to try to sleep, turn down lights and cell phone. Patient watching tv and on cell phone as RN left room. Sitter at bedside. Will continue to monitor.
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
## 2016-10-07 NOTE — Progress Notes (Signed)
Patient requested RN call MD again for pain medication. States morphine gives her a headache and she needs something else because every 8 hours is not frequent enough. RN advised that MD cautioned against any further narcotics due to her decreased GI motility. Patient starts to get upset and requests MD order something to help her sleep. States she has been having pain for over three weeks and has been in three different hospitals over short time frame for same issues. RN advised that she will contact MD to discuss further. Sitter at bedside.

## 2016-10-07 NOTE — Transfer of Care (Signed)
Immediate Anesthesia Transfer of Care Note  Patient: Evelyn [name]  Procedure(s) Performed: Procedure(s): ESOPHAGOGASTRODUODENOSCOPY (EGD) (Left)  Patient Location: PACU and Endoscopy Unit  Anesthesia Type:MAC  Level of Consciousness: awake and patient cooperative  Airway & Oxygen Therapy: Patient Spontanous Breathing and Patient connected to nasal cannula oxygen  Post-op Assessment: Report given to RN and Post -op Vital signs reviewed and stable  Post vital signs: Reviewed and stable  Last Vitals:  Vitals:   10/07/16 0612 10/07/16 1212  BP: 110/77 (!) 131/92  Pulse: 67 87  Resp: 18 (!) 31  Temp: 36.8 C     Last Pain:  Vitals:   10/07/16 1212  TempSrc:   PainSc: 7       Patients Stated Pain Goal: 3 (10/07/16 1212)  Complications: No apparent anesthesia complications

## 2016-10-07 NOTE — Anesthesia Procedure Notes (Signed)
Procedure Name: MAC Date/Time: 10/07/2016 12:35 PM Performed by: Delphia Grates Pre-anesthesia Checklist: Patient identified, Emergency Drugs available, Suction available and Patient being monitored Patient Re-evaluated:Patient Re-evaluated prior to inductionOxygen Delivery Method: Nasal cannula Placement Confirmation: positive ETCO2

## 2016-10-07 NOTE — Op Note (Signed)
[facility] Patient Name: Evelyn [name] Procedure Date: 10/07/2016 MRN: [id number] Attending MD: Barrie Folk , MD Date of Birth: [birth year] CSN: [id number] Age: 35 Admit Type: Inpatient Procedure:                Upper GI endoscopy Indications:              Epigastric abdominal pain, Nausea with vomiting Providers:                Everardo All. Madilyn Fireman, MD, Tomma Rakers, RN, Oletha Blend, Technician Referring MD:              Medicines:                Propofol per Anesthesia Complications:            No immediate complications. Estimated Blood Loss:     Estimated blood loss: none. Procedure:                Pre-Anesthesia Assessment:                           - Prior to the procedure, a History and Physical                            was performed, and patient medications and                            allergies were reviewed. The patient's tolerance of                            previous anesthesia was also reviewed. The risks                            and benefits of the procedure and the sedation                            options and risks were discussed with the patient.                            All questions were answered, and informed consent                            was obtained. Prior Anticoagulants: The patient has                            taken no previous anticoagulant or antiplatelet                            agents. ASA Grade Assessment: III - A patient with                            severe systemic disease. After reviewing the risks  and benefits, the patient was deemed in                            satisfactory condition to undergo the procedure.                           After obtaining informed consent, the endoscope was                            passed under direct vision. Throughout the                            procedure, the patient's blood pressure, pulse, and                             oxygen saturations were monitored continuously. The                            was introduced through the mouth, and advanced to                            the second part of duodenum. The upper GI endoscopy                            was accomplished without difficulty. The patient                            tolerated the procedure well. Findings:      A small hiatal hernia was present.      Scattered mild inflammation characterized by erythema and linear       erosions was found in the gastric antrum. Biopsies were taken with a       cold forceps for Helicobacter pylori testing using CLOtest.      The examined duodenum was normal. Impression:               - Small hiatal hernia.                           - Gastritis. Biopsied.                           - Normal examined duodenum. Moderate Sedation:      no moderate sedation Recommendation:           - Await pathology results.                           - Use a proton pump inhibitor PO BID.                           - Low fat diet [Duration].                           - Continue present medications. Procedure Code(s):        --- Professional ---  80998, Esophagogastroduodenoscopy, flexible,                            transoral; with biopsy, single or multiple Diagnosis Code(s):        --- Professional ---                           K44.9, Diaphragmatic hernia without obstruction or                            gangrene                           K29.70, Gastritis, unspecified, without bleeding                           R10.13, Epigastric pain                           R11.2, Nausea with vomiting, unspecified CPT copyright 2016 American Medical Association. All rights reserved. The codes documented in this report are preliminary and upon coder review may  be revised to meet current compliance requirements. Barrie Folk, MD 10/07/2016 12:52:17 PM This report has been signed electronically. Number of Addenda:  0

## 2016-10-07 NOTE — Progress Notes (Addendum)
PROGRESS NOTE  Evelyn [name] KGY:[id number] DOB: [birth year] DOA: 10/03/2016 PCP: No PCP Per Patient  HPI/Recap of past 24 hours:  Returned from EGD, eating regular diet, still c/o pain, no active nausea , no vomiting, Sitter in room report no n/v observed Patient request benadryl and compazine for headache  Assessment/Plan: Active Problems:   Hypokalemia   Intractable nausea and vomiting   1. Intractable nausea and vomiting, abdominal pain- patient reported she started to have gi issues since April this year, she was hospitalized several time for the same, infact, she was just discharged from the hospital yesterday and returned with recurrent symptoms.        Recent CT ab/pel as mentioned below, kub during this hospitalization unremarkable.        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 She is advised to take small meals and multiple time a day, she is currently on full liquid diet, she request to advance diet, but continue to c/o n/v and abdominal pain and request to be evaluated by GI, I advised her , I will consult GI and GI will give recommendation regarding diet advancement.       Urine tox + HTC, cannabinoid hyperemesis is on differential.       tsh 0.67,       patient will need to avoid agent that could slow gi motility. She already has a GI appointment at [facility]. But she requested to see GI here due to persistent n/v/ab pain. [facility] paged.      S/p EGD on 10/8, gi started low fat diet on her after the egd    EGD:                   Small hiatal hernia.                           - Gastritis. Biopsied.                     - Normal examined duodenum.  2. Hypokalemia/hypomagnesmia- k2.7 on admission, replace , keep k>4, mag>2.  3. History of lupus-continue prednisone, Plaquenil, all her regular  doctors are at [facility]  4. Anemia: stable at baseline, mcv wnl, likely anemia of chronic disease, will check iron panel /b12 unremarkable, folate in process. 5. Suicidal ideation and depression: psych consulted, she does not meet inpatient criteria, she does need to see psychiatry on outpatient basis per psychiatry.  perpsych recommendation:  Start Wellbutrin XL 150 mg po daily for depression. -Start Trazodone 50 mg po qhs for sleep.    [facility] record showed that she was hospitalist on 9/30 and discharged on 10/3 for the same,  Due to c/o headache during that hospitalization, MRI brain was done which is unremarkable. CT ab/pel did show non obstructing stone,  And well positioned right hip prosthesis, otherwise unremarkable.   Per care everywhere: in 04/2016: by [facility]: "35 y/o F w/ hx of SLE, lupus nephritis, osteonecrosis b/l hips, anxiety, depression, presented on transfer from [facility] w/ intractable N/V/D, and abdominal pain. Over the prior 7-10 days, pt had been seen at 4 different hospitals([facility], [facility], [facility], [facility]). . W/u at [facility] was unremarkable. Pt treated for  possible lupus flare w/ burst steroids w/o improvement. CRP wnl, ESR 34. CT abd / pel at [facility] negative. Labs unremarkable. Pt takes prednisone 7.5 mg PO daily for underlying SLE and lupus nephritis (followed at [facility]).  (+) tobacco and marijuana use. Pt asking for nausea meds, pain meds, and something to eat. After patient told that there was no indication for narcotics to treat her pain, she left AMA (wthin hours of arriving to [facility]). "      Code Status: full  Family Communication: patient   Disposition Plan: need gi and psych clearance to discharge   Consultants:  GI  psych  Procedures:  Gastric emptying study on 10/6  Antibiotics:  none   Objective: BP 110/77 (BP Location: Left Arm)   Pulse 67   Temp 98.2 F (36.8 C) (Oral)   Resp 18   Ht _0   (1.651 m)   Wt 57.1 kg (125 lb 14.1 oz)   SpO2 100%   BMI 20.95 kg/m   Intake/Output Summary (Last 24 hours) at 10/07/16 0942 Last data filed at 10/07/16 0530  Gross per 24 hour  Intake             1730 ml  Output                0 ml  Net             1730 ml   Filed Weights   10/03/16 1605  Weight: 57.1 kg (125 lb 14.1 oz)    Exam:   General:  NAD  Cardiovascular: RRR  Respiratory: CTABL  Abdomen: generalized tender, with guarding, no rebound, Soft/ND, positive BS  Musculoskeletal: No Edema  Neuro: aaox3  Data Reviewed: Basic Metabolic Panel:  Recent Labs Lab 10/03/16 1003 10/04/16 0534 10/05/16 0546 10/06/16 0630 10/07/16 0612  NA 137 140 137 137 140  K 2.7* 2.8* 3.4* 3.5 3.9  CL 104 108 107 105 106  CO2 _1 GLUCOSE 127* 76 99 100* 84  BUN 7 6 <5* <5* <5*  CREATININE 0.64 0.62 0.48 0.60 0.55  CALCIUM 8.5* 8.1* 8.1* 8.6* 8.8*  MG  --  1.6* 1.8 1.6* 1.9   Liver Function Tests:  Recent Labs Lab 10/03/16 1003 10/04/16 0534  AST 26 18  ALT 31 22  ALKPHOS 55 43  BILITOT 1.1 0.9  PROT 7.3 6.3*  ALBUMIN 3.5 3.0*    Recent Labs Lab 10/03/16 1003  LIPASE 32   No results for input(s): AMMONIA in the last 168 hours. CBC:  Recent Labs Lab 10/03/16 1003 10/04/16 0534 10/05/16 0546  WBC 15.8* 6.2 6.3  NEUTROABS 12.6*  --  4.4  HGB 10.6* 9.4* 9.0*  HCT 33.4* 29.9* 28.5*  MCV 88.4 89.8 88.8  PLT 253 192 207   Cardiac Enzymes:   No results for input(s): CKTOTAL, CKMB, CKMBINDEX, TROPONINI in the last 168 hours. BNP (last 3 results) No results for input(s): BNP in the last 8760 hours.  ProBNP (last 3 results) No results for input(s): PROBNP in the last 8760 hours.  CBG: No results for input(s): GLUCAP in the last 168 hours.  No results found for this or any previous visit (from the past 240 hour(s)).   Studies: No results found.  Scheduled Meds: . famotidine (PEPCID) IV  20 mg Intravenous Q12H  .  HYDROmorphone  (DILAUDID) injection  0.5 mg Intravenous Once  . hydroxychloroquine  200 mg Oral Daily  . polyethylene glycol  17 g  Oral Daily  . potassium chloride  40 mEq Oral Once  . predniSONE  7.5 mg Oral Q breakfast  . senna-docusate  1 tablet Oral BID    Continuous Infusions: . sodium chloride    . 0.9 % NaCl with KCl 20 mEq / L 100 mL/hr at 10/07/16 0236     Time spent: 52mns  Alen Matheson MD, PhD  [facility] Pager [phone] If 7PM-7AM, please contact night-coverage at [email or web address], password [facility]10/07/2016, 9:42 AM  LOS: 3 days

## 2016-10-07 NOTE — Anesthesia Preprocedure Evaluation (Addendum)
Anesthesia Evaluation  Patient identified by MRN, date of birth, ID band Patient awake    Reviewed: Allergy & Precautions, NPO status , Patient's Chart, lab work & pertinent test results  Airway Mallampati: II  TM Distance: >3 FB Neck ROM: Full    Dental   Pulmonary Current Smoker,    breath sounds clear to auscultation       Cardiovascular  Rhythm:Regular Rate:Normal     Neuro/Psych Anxiety    GI/Hepatic Intractable n/v and gastroparesis. No emesis in 24hrs   Endo/Other    Renal/GU Renal disease     Musculoskeletal  (+) Arthritis ,   Abdominal   Peds  Hematology  (+) anemia ,   Anesthesia Other Findings   Reproductive/Obstetrics                            Lab Results  Component Value Date   WBC 6.3 10/05/2016   HGB 9.0 (L) 10/05/2016   HCT 28.5 (L) 10/05/2016   MCV 88.8 10/05/2016   PLT 207 10/05/2016   Lab Results  Component Value Date   CREATININE 0.55 10/07/2016   BUN <5 (L) 10/07/2016   NA 140 10/07/2016   K 3.9 10/07/2016   CL 106 10/07/2016   CO2 26 10/07/2016    Anesthesia Physical Anesthesia Plan  ASA: II  Anesthesia Plan: MAC   Post-op Pain Management:    Induction: Intravenous  Airway Management Planned: Natural Airway and Nasal Cannula  Additional Equipment:   Intra-op Plan:   Post-operative Plan:   Informed Consent: I have reviewed the patients History and Physical, chart, labs and discussed the procedure including the risks, benefits and alternatives for the proposed anesthesia with the patient or authorized representative who has indicated his/her understanding and acceptance.   Dental advisory given  Plan Discussed with: CRNA  Anesthesia Plan Comments:         Anesthesia Quick Evaluation

## 2016-10-07 NOTE — Progress Notes (Signed)
Callback from Dr. Selena Batten. Advised of patient status and Dr. Selena Batten placed order for Trazodone 50mg  x1.

## 2016-10-08 ENCOUNTER — Encounter (HOSPITAL_COMMUNITY): Payer: Self-pay | Admitting: Gastroenterology

## 2016-10-08 DIAGNOSIS — G8929 Other chronic pain: Secondary | ICD-10-CM

## 2016-10-08 LAB — CBC
HCT: 31.7 % — ABNORMAL LOW (ref 36.0–46.0)
Hemoglobin: 10 g/dL — ABNORMAL LOW (ref 12.0–15.0)
MCH: 27.5 pg (ref 26.0–34.0)
MCHC: 31.5 g/dL (ref 30.0–36.0)
MCV: 87.1 fL (ref 78.0–100.0)
Platelets: 256 K/uL (ref 150–400)
RBC: 3.64 MIL/uL — ABNORMAL LOW (ref 3.87–5.11)
RDW: 14.1 % (ref 11.5–15.5)
WBC: 5.2 K/uL (ref 4.0–10.5)

## 2016-10-08 LAB — BASIC METABOLIC PANEL WITH GFR
Anion gap: 10 (ref 5–15)
BUN: 8 mg/dL (ref 6–20)
CO2: 25 mmol/L (ref 22–32)
Calcium: 9 mg/dL (ref 8.9–10.3)
Chloride: 106 mmol/L (ref 101–111)
Creatinine, Ser: 0.65 mg/dL (ref 0.44–1.00)
GFR calc Af Amer: >60 mL/min (ref >60)
GFR calc non Af Amer: >60 mL/min (ref >60)
Glucose, Bld: 90 mg/dL (ref 65–99)
Potassium: 3.7 mmol/L (ref 3.5–5.1)
Sodium: 141 mmol/L (ref 135–145)

## 2016-10-08 LAB — CLOTEST (H. PYLORI), BIOPSY: Helicobacter screen: NEGATIVE

## 2016-10-08 MED ORDER — BUPROPION HCL ER (XL) 150 MG PO TB24
150.0000 mg | ORAL_TABLET | Freq: Every day | ORAL | 0 refills | Status: DC
Start: 2016-10-08 — End: 2017-08-17

## 2016-10-08 MED ORDER — POTASSIUM CHLORIDE CRYS ER 20 MEQ PO TBCR: 20.0000 meq | EXTENDED_RELEASE_TABLET | Freq: Every day | ORAL | 0 refills | Status: DC

## 2016-10-08 MED ORDER — MAGNESIUM OXIDE -MG SUPPLEMENT 400 (240 MG) MG PO TABS: 400.0000 mg | ORAL_TABLET | Freq: Every day | ORAL | 0 refills | Status: DC

## 2016-10-08 MED ORDER — OMEPRAZOLE 20 MG PO CPDR: 20.0000 mg | DELAYED_RELEASE_CAPSULE | Freq: Every day | ORAL | 0 refills | Status: DC

## 2016-10-08 MED ORDER — LORAZEPAM 1 MG PO TABS
1.0000 mg | ORAL_TABLET | Freq: Once | ORAL | Status: AC
  Administered 2016-10-08: 1 mg via ORAL
  Filled 2016-10-08: qty 1

## 2016-10-08 NOTE — Progress Notes (Signed)
Patient c/o pain and nausea. Refusing to try Tylenol and requests MD be called for something even if one time dose. States abd pain is severe and cannot rest. On call MD paged. Sitter at bedside. Will c/t monitor.

## 2016-10-08 NOTE — Progress Notes (Addendum)
[id number]/tct-[facility] patient will have to call on Friday for an appointment on Monday.  This information and information for the social services dept given to patient to get established with the clinic and to have medicaid moved to [place] from [place] if she wishes. Bjorn Loser Davis,BSN,RN3,CCM [phone]

## 2016-10-08 NOTE — Clinical Social Work Note (Addendum)
Clinical Social Work Assessment  Patient Details  Name: Evelyn [name] MRN: [id number] Date of Birth: 04-13-1981  Date of referral:  10/08/16               Reason for consult:  Emotional/Coping/Adjustment to Illness, [employer] sought to share information with:    Permission granted to share information::     Name::        Agency::     Relationship::     Contact Information:     Housing/Transportation Living arrangements for the past 2 months:  No permanent address (lives w/ Friend ) Source of Information:  Patient Patient Interpreter Needed:  None Criminal Activity/Legal Involvement Pertinent to Current Situation/Hospitalization:  No - Comment as needed Significant Relationships:  Friend Lives with:  Friends Do you feel safe going back to the place where you live?  Yes Need for family participation in patient care:  Yes (Comment)  Care giving concerns:  Patient reports she has been in and out of hospitals because of chronic pain. 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 She reports over the past three weeks she has has nausea and vomiting and severe pain in her stomach. She reports the pain in unbearable at times and she feels she cannot manage them at home so comes to hospital. The patient reports she is not suicidal but often says, "I would rather be dead then continue to deal with the pain." Patient denies she has ever attempted to suicide.   Social Worker assessment / plan:  LCSW met with patient at bedside, patient agreeable to talk. Patient reports chronic pain and issues. The patient reports she is not happy with her treatment because she feels nothing is controlling her pain. The patient reports she has a hx of saying she would rather die than have pain. The patient reports she has daughter to live for and would not attempt to kill herself. The patient report she currently resides in [place] with a friend after her home  was bough by [employer]. She is currently looking for another home and updating her insurance information.  Patient reports her support system is friends and family in town-[place] and out of town. She reports she has a 10 yr. Old daughter that lives in [place] with family.  LCSWA provided emotional support and gave patient resources for counseling in the area.   Employment status:  Disabled (Comment on whether or not currently receiving Disability) Insurance information:  Catering manager PT Recommendations:  Not assessed at this time Information / Referral to community resources:     Patient/Family's Response to care:  " I do not feel I am getting the support I need from the medical staff."   Patient/Family's Understanding of and Emotional Response to Diagnosis, Current Treatment, and Prognosis: " I have lupus and I am will keep having pain. My pain is a usually a 8 of 10 most days. I wish they would stop saying they understand my pain because they will never understand my pain, I hate when they say it. "  Emotional Assessment Appearance:  Appears older than stated age Attitude/Demeanor/Rapport:    Affect (typically observed):  Accepting, Pleasant, Calm Orientation:  Oriented to Self, Oriented to Place, Oriented to  Time, Oriented to Situation Alcohol / Substance use:  Never Used Psych involvement (Current and /or in the community):  Yes- reported SI. Reported feeling  depressed. Cleared to go home.   Discharge Needs  Concerns to be addressed:  Adjustment to Illness, Coping/Stress Concerns, Mental Health Concerns Readmission within the last 30 days:    Current discharge risk:  Chronically ill Barriers to Discharge:  No Barriers Identified   Lia Hopping, LCSW 10/08/2016, 11:57 AM

## 2016-10-08 NOTE — Progress Notes (Signed)
Patient given Ativan 0.5mg  PO as ordered. Advised patient that MD does not want to add narcotics due to GI issues as MD has previously discussed with her. Patient upset about decision and reports she "may act up". RN discussed alt pain mgt techniques, cool cloth, etc, patient declined. Was agreeable to taking Tylenol as ordered. Patient sitting upright, watching tv, conversing with sitter at bedside. Will continue to monitor.

## 2016-10-08 NOTE — Progress Notes (Signed)
Pt left the unit in stable condition transported home by her family.

## 2016-10-08 NOTE — Progress Notes (Signed)
Subjective: Having escalating generalized pain of hips. Nausea persists. No vomiting.  Objective: Vital signs in last 24 hours: Temp:  [98.3 F (36.8 C)-98.9 F (37.2 C)] 98.9 F (37.2 C) (10/09 0604) Pulse Rate:  [80-105] 80 (10/09 0604) Resp:  [15-31] 20 (10/09 0604) BP: (91-131)/(63-92) 91/69 (10/09 0604) SpO2:  [93 %-100 %] 93 % (10/09 0604) Weight change:  Last BM Date: 10/07/16  PE: GEN:  Depressed mood, flat affect, NAD ABD:  Heating pad in place, mild generalized tenderness  Lab Results: CBC    Component Value Date/Time   WBC 5.2 10/08/2016 0525   RBC 3.64 (L) 10/08/2016 0525   HGB 10.0 (L) 10/08/2016 0525   HCT 31.7 (L) 10/08/2016 0525   PLT 256 10/08/2016 0525   MCV 87.1 10/08/2016 0525   MCH 27.5 10/08/2016 0525   MCHC 31.5 10/08/2016 0525   RDW 14.1 10/08/2016 0525   LYMPHSABS 1.3 10/05/2016 0546   MONOABS 0.5 10/05/2016 0546   EOSABS 0.1 10/05/2016 0546   BASOSABS 0.0 10/05/2016 0546   CMP     Component Value Date/Time   NA 141 10/08/2016 0525   K 3.7 10/08/2016 0525   CL 106 10/08/2016 0525   CO2 25 10/08/2016 0525   GLUCOSE 90 10/08/2016 0525   BUN 8 10/08/2016 0525   CREATININE 0.65 10/08/2016 0525   CALCIUM 9.0 10/08/2016 0525   PROT 6.3 (L) 10/04/2016 0534   ALBUMIN 3.0 (L) 10/04/2016 0534   AST 18 10/04/2016 0534   ALT 22 10/04/2016 0534   ALKPHOS 43 10/04/2016 0534   BILITOT 0.9 10/04/2016 0534   GFRNONAA >60 10/08/2016 0525   GFRAA >60 10/08/2016 0525   Assessment:  1.  Nausea.  No vomiting at present.  Narcotics can do this, and cause gastroparesis, but patient tells me that she has been on dilaudid as outpatient and didn't have her current nausea symptoms.  Narcotic withdrawal can also cause some nausea as well, as can multiple other different medications. 2.  Gastritis, H. pylori biopsies (CLO) pending, doubt as cause of her nausea. 3.  Delayed gastric emptying, could be from autoimmune disease (Lupus) as well as  narcotics.  Plan:  1.  OK for patient to be discharged home today on her outpatient medication regimen. 2.  Small, frequent meals.  Gastroparesis-type diet. 3.  Ondansetron prn for nausea; avoid metoclopramide given history of extrapyramidal side effects. 4.  Patient receives her medical [facility] at [facility], and wants to establish with gastroenterologist there, which sounds reasonable. 5.  [facility] will sign-off; please call with questions; thank you for the consult.   Evelyn [name] 10/08/2016, 8:26 AM   Pager [phone] If no answer or after 5 PM call [phone]

## 2016-10-08 NOTE — Progress Notes (Signed)
Callback from [place], states notes reviewed and Ativan 0.5mg  PO ordered. Will continue to monitor patient. Sitter at bedside.

## 2016-10-08 NOTE — Progress Notes (Signed)
Discharge instructions given to pt, verbalized understanding. Awaiting family for transportation home. 

## 2016-10-08 NOTE — Discharge Summary (Signed)
Discharge Summary  Sharnise Blough NUU:[id number] DOB: January 09, 1981  PCP: No PCP Per Patient  Admit date: 10/03/2016 Discharge date: 10/08/2016  Time spent: >71mns  Recommendations for Outpatient Follow-up:  1. F/u with PMD within a week  for hospital discharge follow up, repeat cbc/bmp at follow up 2. At time of discharge patient is requesting STD test, she denies symptom, but report one of her girlfriend has this, she states if this is not done here in the hospital she will go to the Ed to have it done, urine gc/chlymadia test ordered, patient is to advised to follow up with pmd for this  Discharge Diagnoses:  Active Hospital Problems   Diagnosis Date Noted  . Major depressive disorder, recurrent episode ([place] [place] 10/07/2016  . Hypokalemia 10/03/2016  . Intractable nausea and vomiting 10/03/2016    Resolved Hospital Problems   Diagnosis Date Noted Date Resolved  No resolved problems to display.    Discharge Condition: stable  Diet recommendation: gastroparesis  Filed Weights   10/03/16 1605  Weight: 57.1 kg (125 lb 14.1 oz)    History of present illness:  Evelyn [name] is a 35y.o. female, With history of lupus nephritis, osteonecrosis bilateral hips, chronic hip pain who presented to the ED visit abdominal pain intractable vomiting and diarrhea. [facility] Today patient had CT of the abdomen pelvis which was unremarkable in the ED, [facility]  Patient also complains of blood in the vomitus, no melena or rectal bleeding. She also complains of abdominal pain. She denies chest pain, no shortness of breath. No fever or dysuria.  Hospital Course:  Principal Problem:   Major depressive disorder, recurrent episode ([facility] Active Problems:   Hypokalemia   Intractable nausea and vomiting  1. Intractable nausea and vomiting, abdominal pain-patient reported  she started to have gi issues since April this year, she was hospitalized several time for the same, infact, she was just discharged from the hospital yesterday and returned with recurrent symptoms.her prealbumin is wnl.                 Recent CT ab/pel as mentioned below, kub during this hospitalization unremarkable.                 gastric emptying study on 10/7 showed slow gi motility,  Patient report she is allergic to Reglan and erythromycin,                 .                 Urine tox + HTC, cannabinoid hyperemesis is on differential.                  tsh 0.67,                  She already has a GI appointment at [facility]. But she requested to see GI here due to persistent n/v/ab pain.                         S/p EGD on 10/8, gi started low fat diet and cleared her to be discharged from GI stand point. She tolerated regular diet, no vomiting                 patient is advised to avoid narcotics /THc or any other agent that could further slow down GI motility,  She is advised to take small meals and multiple time a day. She is to call [facility] to follow up on h pylori test, she is also given ppi prescription at discharge  EGD:                   Small hiatal hernia. - Gastritis. Biopsied. - Normal examined duodenum.    2. Hypokalemia/hypomagnesmia-k2.7 on admission, replaced and normalized..  3. History of lupus-continue prednisone, Plaquenil, all her regular doctors are at [facility]  4. Anemia: stable at baseline, mcv wnl, likely anemia of chronic disease, will check iron panel /b12 unremarkable, folate in process.  5. Suicidal ideation and depression: psych consulted, she does not meet inpatient criteria, she does need to see psychiatry on outpatient basis per psychiatry.  perpsych recommendation:  Start Wellbutrin XL 150 mg po daily for depression. -Start Trazodone 50 mg po qhs for sleep.    [facility] record showed that she was  hospitalist on 9/30 and discharged on 10/3 for the same,  Due to c/o headache during that hospitalization, MRI brain was done which is unremarkable. CT ab/pel did show non obstructing stone,  And well positioned right hip prosthesis, otherwise unremarkable.   Per care everywhere: in 04/2016: by [facility]: "35 y/o F w/ hx of SLE, lupus nephritis, osteonecrosis b/l hips, anxiety, depression, presented on transfer from [facility] w/ intractable N/V/D, and abdominal pain. Over the prior 7-10 days, pt had been seen at 4 different hospitals([facility], [facility], [facility], [facility]). . W/u at [facility] was unremarkable. Pt treated for possible lupus flare w/ burst steroids w/o improvement. CRP wnl, ESR 34. CT abd / pel at [facility] negative. Labs unremarkable. Pt takes prednisone 7.5 mg PO daily for underlying SLE and lupus nephritis (followed at [facility]).  (+) tobacco and marijuana use. Pt asking for nausea meds, pain meds, and something to eat. After patient told that there was no indication for narcotics to treat her pain, she left AMA (wthin hours of arriving to [facility]). "      Code Status: full  Family Communication: patient   Disposition Plan:  gi and psych both cleared patient to be discharged from the hospital with outpatient follow ups   Consultants:  GI  psych  Procedures:  Gastric emptying study on 10/6  Antibiotics:  none    Discharge Exam: BP 91/69 (BP Location: Right Arm)   Pulse 80   Temp 98.9 F (37.2 C) (Oral)   Resp 20   Ht '5\' 5"'  (1.651 m)   Wt 57.1 kg (125 lb 14.1 oz)   SpO2 93%   BMI 20.95 kg/m     General:  NAD  Cardiovascular: RRR  Respiratory: CTABL  Abdomen: generalized tender, with guarding, no rebound, Soft/ND, positive BS  Musculoskeletal: No Edema  Neuro: aaox3   Discharge Instructions You were cared for by a hospitalist during your hospital stay. If you have any questions about your discharge medications or the care  you received while you were in the hospital after you are discharged, you can call the unit and asked to speak with the hospitalist on call if the hospitalist that took care of you is not available. Once you are discharged, your primary care physician will handle any further medical issues. Please note that NO REFILLS for any discharge medications will be authorized once you are discharged, as it is imperative that you return to your primary care physician (or establish a relationship with a primary care physician  if you do not have one) for your aftercare needs so that they can reassess your need for medications and monitor your lab values.  Discharge Instructions    Diet - low sodium heart healthy    Complete by:  As directed    Small meals each time with multiple meals daily   Increase activity slowly    Complete by:  As directed        Medication List    TAKE these medications   acetaminophen 500 MG tablet Commonly known as:  TYLENOL Take 1,000 mg by mouth every 4 (four) hours as needed for moderate pain.   buPROPion 150 MG 24 hr tablet Commonly known as:  WELLBUTRIN XL Take 1 tablet (150 mg total) by mouth daily.   HYDROmorphone 2 MG tablet Commonly known as:  DILAUDID Take 2 mg by mouth every 6 (six) hours as needed for severe pain.   hydroxychloroquine 200 MG tablet Commonly known as:  PLAQUENIL Take 1 tablet (200 mg total) by mouth 2 (two) times daily. For lupus.   Magnesium Oxide 400 (240 Mg) MG Tabs Take 400 mg by mouth daily.   omeprazole 20 MG capsule Commonly known as:  PRILOSEC Take 1 capsule (20 mg total) by mouth daily.   potassium chloride SA 20 MEQ tablet Commonly known as:  K-DUR,KLOR-CON Take 1 tablet (20 mEq total) by mouth daily.   predniSONE 5 MG tablet Commonly known as:  DELTASONE Take 7.5 mg by mouth daily with breakfast.   promethazine 50 MG tablet Commonly known as:  PHENERGAN Take 50 mg by mouth every 6 (six) hours as needed for nausea or  vomiting.      Allergies  Allergen Reactions  . Alfalfa Other (See Comments) and Hives  . Metoclopramide Hcl Anaphylaxis    Body started contracting, couldn't breathe, throat swelled up  . Alpha Blocker Quinazolines Hives and Nausea Only  . Aspirin Other (See Comments)    Contraindicated with Lupus  . Cyclobenzaprine Other (See Comments)    Unknown  . Enoxaparin Other (See Comments)    "Spasms of muscles in hands and feet"  . Garlic Diarrhea and Nausea And Vomiting  . Nsaids     Flares up her lupus  . Other Nausea And Vomiting  . Pork-Derived Products Diarrhea and Nausea And Vomiting  . Sulfur Other (See Comments)    Flares up her lupus Flares up her lupus Sulfa.   . Tramadol Other (See Comments)    Lupus flare Drug interaction with kidney medicine  . Ciprofloxacin Diarrhea and Nausea And Vomiting   Follow-up Information    Ramon Dredge, MD Follow up in 1 week(s).   Specialty:  General Practice Why:  hospital follow up appointment,  Contact information: [place] [address] [phone]        [facility] .   Why:  please call in two weeks to follow up on H. pylori biopsies (CLO) test result. Contact information: [place] [address] [phone]            The results of significant diagnostics from this hospitalization (including imaging, microbiology, ancillary and laboratory) are listed below for reference.    Significant Diagnostic Studies: Nm Gastric Emptying  Result Date: 10/05/2016 CLINICAL DATA:  Abdominal pain with nausea and vomiting for 4 months. EXAM: NUCLEAR MEDICINE GASTRIC EMPTYING SCAN TECHNIQUE: After oral ingestion of radiolabeled meal, sequential abdominal images were obtained for 4 hours. Percentage of activity emptying the  stomach was calculated at 1 hour, 2 hour, 3 hour, and 4 hours. RADIOPHARMACEUTICALS:  2.0 mCi Tc-51msulfur colloid in standardized meal COMPARISON:  None.  FINDINGS: Expected location of the stomach in the left upper quadrant. Ingested meal empties the stomach gradually over the course of the study. 9% emptied at 1 hr ( normal >= 10%) 22% emptied at 2 hr ( normal >= 40%) 41% emptied at 3 hr ( normal >= 70%) 57% emptied at 4 hr ( normal >= 90%) IMPRESSION: Delayed gastric emptying study. Electronically Signed   By: JStaci RighterM.D.   On: 10/05/2016 15:51   Dg Abd Acute W/chest  Result Date: 10/03/2016 CLINICAL DATA:  Mid abdominal pain with worsening nausea and vomiting today. Recently hospitalized for same symptoms. EXAM: DG ABDOMEN ACUTE W/ 1V CHEST COMPARISON:  Chest radiographs 05/16/2016 and 03/13/2016. Pelvic and left hip radiographs 04/13/2016. FINDINGS: The heart size and mediastinal contours are normal. The lungs are clear. There is no pleural effusion or pneumothorax. No acute osseous findings are identified. Small soft tissue calcification adjacent to the humeral head, suspicious for calcific tendinitis. The abdomen is nearly gasless. There is some gas within the stomach and colon. No evidence of bowel distention, wall thickening or free intraperitoneal air. There is a 4 mm calcification overlying the interpolar region of the left kidney which may reflect a small renal calculus. Bilateral pelvic calcifications are grossly stable. Interval right total hip arthroplasty. Sclerosis of the left femoral head suspicious for avascular necrosis. IMPRESSION: 1. No acute findings demonstrated within the chest, abdomen or pelvis. 2. Suspected nonobstructing left renal calculus. 3. Interval right total hip arthroplasty. 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 Electronically Signed   By: WRichardean SaleM.D.   On: 10/03/2016 11:28    Microbiology: No results found for this or any previous visit (from the past 240 hour(s)).   Labs: Basic Metabolic Panel:  Recent Labs Lab 10/04/16 0534 10/05/16 0546 10/06/16 0630 10/07/16 0612  10/08/16 0525  NA 140 137 137 140 141  K 2.8* 3.4* 3.5 3.9 3.7  CL 108 107 105 106 106  CO2 '27 25 27 26 25  ' GLUCOSE 76 99 100* 84 90  BUN 6 <5* <5* <5* 8  CREATININE 0.62 0.48 0.60 0.55 0.65  CALCIUM 8.1* 8.1* 8.6* 8.8* 9.0  MG 1.6* 1.8 1.6* 1.9  --    Liver Function Tests:  Recent Labs Lab 10/03/16 1003 10/04/16 0534  AST 26 18  ALT 31 22  ALKPHOS 55 43  BILITOT 1.1 0.9  PROT 7.3 6.3*  ALBUMIN 3.5 3.0*    Recent Labs Lab 10/03/16 1003  LIPASE 32   No results for input(s): AMMONIA in the last 168 hours. CBC:  Recent Labs Lab 10/03/16 1003 10/04/16 0534 10/05/16 0546 10/08/16 0525  WBC 15.8* 6.2 6.3 5.2  NEUTROABS 12.6*  --  4.4  --   HGB 10.6* 9.4* 9.0* 10.0*  HCT 33.4* 29.9* 28.5* 31.7*  MCV 88.4 89.8 88.8 87.1  PLT 253 192 207 256   Cardiac Enzymes: No results for input(s): CKTOTAL, CKMB, CKMBINDEX, TROPONINI in the last 168 hours. BNP: BNP (last 3 results) No results for input(s): BNP in the last 8760 hours.  ProBNP (last 3 results) No results for input(s): PROBNP in the last 8760 hours.  CBG: No results for input(s): GLUCAP in the last 168 hours.     Signed:Florencia ReasonsMD, PhD  [facility] 10/08/2016, 11:26 AM

## 2016-10-09 LAB — GC/CHLAMYDIA PROBE AMP (~~LOC~~) NOT AT ARMC
Chlamydia: NEGATIVE
Neisseria Gonorrhea: NEGATIVE

## 2016-10-09 NOTE — Anesthesia Postprocedure Evaluation (Signed)
Anesthesia Post Note  Patient: Tahra Hitzeman  Procedure(s) Performed: Procedure(s) (LRB): ESOPHAGOGASTRODUODENOSCOPY (EGD) (Left)  Patient location during evaluation: PACU Anesthesia Type: MAC Level of consciousness: awake and alert Pain management: pain level controlled Vital Signs Assessment: post-procedure vital signs reviewed and stable Respiratory status: spontaneous breathing, nonlabored ventilation, respiratory function stable and patient connected to nasal cannula oxygen Cardiovascular status: stable and blood pressure returned to baseline Anesthetic complications: no    Last Vitals:  Vitals:   10/07/16 2110 10/08/16 0604  BP: 103/70 91/69  Pulse: (!) 104 80  Resp: 18 20  Temp: 36.8 C 37.2 C    Last Pain:  Vitals:   10/08/16 0900  TempSrc:   PainSc: 0-No pain                 Kennieth Rad

## 2016-10-17 LAB — FOLATE RBC: Folate, Hemolysate: 333.4 ng/mL

## 2016-11-07 ENCOUNTER — Inpatient Hospital Stay
Admission: EM | Admit: 2016-11-07 | Discharge: 2016-11-13 | Disposition: A | Discharge status: Home / Self Care | Source: Home / Self Care | Admitting: Internal Medicine

## 2016-11-07 ENCOUNTER — Encounter: Admit: 2016-11-07

## 2016-11-07 DIAGNOSIS — N200 Calculus of kidney: Principal | ICD-10-CM

## 2016-11-07 LAB — CBC WITH AUTO DIFFERENTIAL
Basophils %: 0.2 % (ref 0.0–2.0)
Basophils Absolute: 0.02 E9/L (ref 0.00–0.20)
Eosinophils %: 0.9 % (ref 0.0–6.0)
Eosinophils Absolute: 0.1 E9/L (ref 0.05–0.50)
Hematocrit: 34.6 % (ref 34.0–48.0)
Hemoglobin: 10.5 g/dL — ABNORMAL LOW (ref 11.5–15.5)
Immature Granulocytes #: 0.04 E9/L
Immature Granulocytes %: 0.3 % (ref 0.0–5.0)
Lymphocytes %: 13.4 % — ABNORMAL LOW (ref 20.0–42.0)
Lymphocytes Absolute: 1.56 E9/L (ref 1.50–4.00)
MCH: 27.6 pg (ref 26.0–35.0)
MCHC: 30.3 % — ABNORMAL LOW (ref 32.0–34.5)
MCV: 90.8 fL (ref 80.0–99.9)
MPV: 12 fL (ref 7.0–12.0)
Monocytes %: 7.3 % (ref 2.0–12.0)
Monocytes Absolute: 0.85 E9/L (ref 0.10–0.95)
Neutrophils %: 77.9 % (ref 43.0–80.0)
Neutrophils Absolute: 9.07 E9/L — ABNORMAL HIGH (ref 1.80–7.30)
Platelets: 251 E9/L (ref 130–450)
RBC: 3.81 E12/L (ref 3.50–5.50)
RDW: 14.4 fL (ref 11.5–15.0)
WBC: 11.6 E9/L — ABNORMAL HIGH (ref 4.5–11.5)

## 2016-11-07 LAB — COMPREHENSIVE METABOLIC PANEL
ALT: 15 U/L (ref 0–32)
AST: 20 U/L (ref 0–31)
Albumin: 3.8 g/dL (ref 3.5–5.2)
Alkaline Phosphatase: 70 U/L (ref 35–104)
Anion Gap: 17 mmol/L — ABNORMAL HIGH (ref 7–16)
BUN: 15 mg/dL (ref 6–20)
CO2: 24 mmol/L (ref 22–29)
Calcium: 9.3 mg/dL (ref 8.6–10.2)
Chloride: 100 mmol/L (ref 98–107)
Creatinine: 1 mg/dL (ref 0.5–1.0)
GFR African American: >60
GFR Non-African American: >60 mL/min/{1.73_m2} (ref >=60)
Glucose: 125 mg/dL — ABNORMAL HIGH (ref 74–109)
Potassium: 3.9 mmol/L (ref 3.5–5.0)
Sodium: 141 mmol/L (ref 132–146)
Total Bilirubin: 0.2 mg/dL (ref 0.0–1.2)
Total Protein: 8.5 g/dL — ABNORMAL HIGH (ref 6.4–8.3)

## 2016-11-07 LAB — URINALYSIS
Bilirubin Urine: NEGATIVE
Glucose, Ur: NEGATIVE mg/dL
Leukocyte Esterase, Urine: NEGATIVE
Nitrite, Urine: NEGATIVE
Protein, UA: 100 mg/dL — AB
Specific Gravity, UA: 1.02 (ref 1.005–1.030)
Urobilinogen, Urine: 0.2 E.U./dL (ref <2.0)
pH, UA: 7 (ref 5.0–9.0)

## 2016-11-07 LAB — POCT CHEM BASIC W ICA
POC BUN: 22
POC Chloride: 107
POC Creatinine: 1.1
POC Glucose: 124
POC Potassium: 9
POC Sodium: 134

## 2016-11-07 LAB — MICROSCOPIC URINALYSIS

## 2016-11-07 LAB — LIPASE: Lipase: 41 U/L (ref 13–60)

## 2016-11-07 LAB — LACTIC ACID: Lactic Acid: 1.8 mmol/L (ref 0.5–2.2)

## 2016-11-07 LAB — POC PREGNANCY UR-QUAL: Preg Test, Ur: NEGATIVE

## 2016-11-07 MED ORDER — ONDANSETRON HCL 4 MG/2ML IJ SOLN
4 MG/2ML | Freq: Once | INTRAMUSCULAR | Status: AC
Start: 2016-11-07 — End: 2016-11-07
  Administered 2016-11-07: 07:00:00 8 mg via INTRAVENOUS

## 2016-11-07 MED ORDER — NALBUPHINE HCL 10 MG/ML IJ SOLN
10 MG/ML | Freq: Once | INTRAMUSCULAR | Status: AC
Start: 2016-11-07 — End: 2016-11-07
  Administered 2016-11-07: 07:00:00 10 mg via INTRAVENOUS

## 2016-11-07 MED ORDER — MIRTAZAPINE 15 MG PO TABS
15 MG | Freq: Every evening | ORAL | Status: DC
Start: 2016-11-07 — End: 2016-11-12
  Administered 2016-11-08 – 2016-11-12 (×5): 15 mg via ORAL

## 2016-11-07 MED ORDER — ONDANSETRON HCL 4 MG/2ML IJ SOLN
4 MG/2ML | Freq: Once | INTRAMUSCULAR | Status: AC
Start: 2016-11-07 — End: 2016-11-07
  Administered 2016-11-07: 11:00:00 4 mg via INTRAVENOUS

## 2016-11-07 MED ORDER — IOPAMIDOL 76 % IV SOLN
76 % | Freq: Once | INTRAVENOUS | Status: AC | PRN
Start: 2016-11-07 — End: 2016-11-07
  Administered 2016-11-07: 08:00:00 80 mL via INTRAVENOUS

## 2016-11-07 MED ORDER — TAMSULOSIN HCL 0.4 MG PO CAPS
0.4 MG | Freq: Every day | ORAL | Status: DC
Start: 2016-11-07 — End: 2016-11-13
  Administered 2016-11-07 – 2016-11-13 (×6): 0.4 mg via ORAL

## 2016-11-07 MED ORDER — MAGNESIUM HYDROXIDE 400 MG/5ML PO SUSP
400 MG/5ML | Freq: Every day | ORAL | Status: DC | PRN
Start: 2016-11-07 — End: 2016-11-13
  Administered 2016-11-10 – 2016-11-12 (×2): 30 mL via ORAL

## 2016-11-07 MED ORDER — BUPROPION HCL ER (XL) 150 MG PO TB24
150 MG | Freq: Every morning | ORAL | Status: DC
Start: 2016-11-07 — End: 2016-11-13
  Administered 2016-11-07 – 2016-11-13 (×6): 150 mg via ORAL

## 2016-11-07 MED ORDER — SODIUM CHLORIDE 0.9 % IV SOLN
0.9 % | INTRAVENOUS | Status: DC
Start: 2016-11-07 — End: 2016-11-10
  Administered 2016-11-07 (×2): via INTRAVENOUS

## 2016-11-07 MED ORDER — ACETAMINOPHEN 325 MG PO TABS
325 MG | ORAL | Status: DC | PRN
Start: 2016-11-07 — End: 2016-11-13

## 2016-11-07 MED ORDER — MAGNESIUM OXIDE 400 (240 MG) MG PO TABS
400 (240 Mg) MG | Freq: Every day | ORAL | Status: DC
Start: 2016-11-07 — End: 2016-11-13
  Administered 2016-11-07 – 2016-11-13 (×6): 400 mg via ORAL

## 2016-11-07 MED ORDER — ONDANSETRON HCL 4 MG/2ML IJ SOLN
4 MG/2ML | Freq: Four times a day (QID) | INTRAMUSCULAR | Status: DC | PRN
Start: 2016-11-07 — End: 2016-11-13
  Administered 2016-11-07 – 2016-11-13 (×14): 4 mg via INTRAVENOUS

## 2016-11-07 MED ORDER — LIDOCAINE-HYDROCORTISONE ACE 3-0.5 % RE CREA
RECTAL | Status: DC | PRN
Start: 2016-11-07 — End: 2016-11-13
  Administered 2016-11-08: 03:00:00 1 via RECTAL

## 2016-11-07 MED ORDER — HYDROMORPHONE HCL 1 MG/ML IJ SOLN
1 MG/ML | Freq: Once | INTRAMUSCULAR | Status: AC
Start: 2016-11-07 — End: 2016-11-07
  Administered 2016-11-07: 09:00:00 1 mg via INTRAVENOUS

## 2016-11-07 MED ORDER — HYDROMORPHONE HCL 1 MG/ML IJ SOLN
1 MG/ML | INTRAMUSCULAR | Status: DC | PRN
Start: 2016-11-07 — End: 2016-11-12
  Administered 2016-11-07 – 2016-11-12 (×23): 1 mg via INTRAVENOUS

## 2016-11-07 MED ORDER — PANTOPRAZOLE SODIUM 40 MG PO TBEC
40 MG | Freq: Every day | ORAL | Status: DC
Start: 2016-11-07 — End: 2016-11-13
  Administered 2016-11-07 – 2016-11-13 (×6): 40 mg via ORAL

## 2016-11-07 MED ORDER — POTASSIUM CHLORIDE CRYS ER 20 MEQ PO TBCR
20 MEQ | Freq: Every day | ORAL | Status: DC
Start: 2016-11-07 — End: 2016-11-13
  Administered 2016-11-07 – 2016-11-13 (×6): 20 meq via ORAL

## 2016-11-07 MED ORDER — NORMAL SALINE FLUSH 0.9 % IV SOLN
0.9 % | INTRAVENOUS | Status: DC | PRN
Start: 2016-11-07 — End: 2016-11-13
  Administered 2016-11-07 – 2016-11-10 (×5): 10 mL via INTRAVENOUS

## 2016-11-07 MED ORDER — PROMETHAZINE HCL 25 MG/ML IJ SOLN
25 MG/ML | Freq: Once | INTRAMUSCULAR | Status: AC
Start: 2016-11-07 — End: 2016-11-07
  Administered 2016-11-07: 09:00:00 25 mg via INTRAMUSCULAR

## 2016-11-07 MED ORDER — NORMAL SALINE FLUSH 0.9 % IV SOLN
0.9 % | Freq: Two times a day (BID) | INTRAVENOUS | Status: DC
Start: 2016-11-07 — End: 2016-11-13
  Administered 2016-11-10 – 2016-11-13 (×3): 10 mL via INTRAVENOUS

## 2016-11-07 MED ORDER — PHENAZOPYRIDINE HCL 100 MG PO TABS
100 MG | Freq: Three times a day (TID) | ORAL | Status: DC
Start: 2016-11-07 — End: 2016-11-08
  Administered 2016-11-07 – 2016-11-08 (×5): 200 mg via ORAL

## 2016-11-07 MED ORDER — SODIUM CHLORIDE 0.9 % IV BOLUS
0.9 % | Freq: Once | INTRAVENOUS | Status: AC
Start: 2016-11-07 — End: 2016-11-07
  Administered 2016-11-07: 07:00:00 1000 mL via INTRAVENOUS

## 2016-11-07 MED ORDER — PREDNISONE 5 MG PO TABS
5 MG | Freq: Every day | ORAL | Status: DC
Start: 2016-11-07 — End: 2016-11-13
  Administered 2016-11-07 – 2016-11-13 (×7): 7.5 mg via ORAL

## 2016-11-07 MED FILL — POTASSIUM CHLORIDE CRYS ER 20 MEQ PO TBCR: 20 MEQ | ORAL | Qty: 1

## 2016-11-07 MED FILL — PHENAZOPYRIDINE HCL 100 MG PO TABS: 100 MG | ORAL | Qty: 2

## 2016-11-07 MED FILL — PROTONIX 40 MG PO TBEC: 40 MG | ORAL | Qty: 1

## 2016-11-07 MED FILL — MIRTAZAPINE 15 MG PO TABS: 15 MG | ORAL | Qty: 1

## 2016-11-07 MED FILL — ONDANSETRON HCL 4 MG/2ML IJ SOLN: 4 MG/2ML | INTRAMUSCULAR | Qty: 4

## 2016-11-07 MED FILL — ONDANSETRON HCL 4 MG/2ML IJ SOLN: 4 MG/2ML | INTRAMUSCULAR | Qty: 2

## 2016-11-07 MED FILL — BUPROPION HCL ER (XL) 150 MG PO TB24: 150 MG | ORAL | Qty: 1

## 2016-11-07 MED FILL — HYDROMORPHONE HCL 1 MG/ML IJ SOLN: 1 MG/ML | INTRAMUSCULAR | Qty: 1

## 2016-11-07 MED FILL — PREDNISONE 5 MG PO TABS: 5 MG | ORAL | Qty: 2

## 2016-11-07 MED FILL — NALBUPHINE HCL 10 MG/ML IJ SOLN: 10 MG/ML | INTRAMUSCULAR | Qty: 1

## 2016-11-07 MED FILL — DILAUDID 1 MG/ML IJ SOLN: 1 MG/ML | INTRAMUSCULAR | Qty: 1

## 2016-11-07 MED FILL — NORMAL SALINE FLUSH 0.9 % IV SOLN: 0.9 % | INTRAVENOUS | Qty: 10

## 2016-11-07 MED FILL — LIDOCAINE-HYDROCORTISONE ACE 3-0.5 % RE CREA: RECTAL | Qty: 7

## 2016-11-07 MED FILL — TAMSULOSIN HCL 0.4 MG PO CAPS: 0.4 MG | ORAL | Qty: 1

## 2016-11-07 MED FILL — MAGNESIUM OXIDE 400 (240 MG) MG PO TABS: 400 (240 Mg) MG | ORAL | Qty: 1

## 2016-11-07 MED FILL — PHENERGAN 25 MG/ML IJ SOLN: 25 MG/ML | INTRAMUSCULAR | Qty: 1

## 2016-11-07 MED FILL — SODIUM CHLORIDE 0.9 % IV SOLN: 0.9 % | INTRAVENOUS | Qty: 1000

## 2016-11-07 NOTE — Progress Notes (Signed)
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 Pain got worse prior to the admission associated with vomiting.  Did not have fever. Has been having constipation and rectal bleeding due to her hemorrhoids she states.    In the ER CT abdomen revealed a 3 mm left UVJ stone. With the left-sided moderate hydronephrosis.  She continues with left-sided pain. No vomiting this morning.    She has been on oral Dilaudid at home for her hip pain.    ??? buPROPion  150 mg Oral QAM   ??? magnesium oxide  400 mg Oral Daily   ??? mirtazapine  15 mg Oral Nightly   ??? pantoprazole  40 mg Oral QAM AC   ??? predniSONE  7.5 mg Oral Daily   ??? potassium chloride  20 mEq Oral Daily   ??? sodium chloride flush  10 mL Intravenous 2 times per day   ??? phenazopyridine  200 mg Oral TID WC   ??? tamsulosin  0.4 mg Oral Daily       sodium chloride flush 10 mL PRN   acetaminophen 650 mg Q4H PRN   magnesium hydroxide 30 mL Daily PRN   ondansetron 4 mg Q6H PRN   HYDROmorphone 1 mg Q3H PRN        Objective:    BP 130/74    Pulse 84    Temp 98 ??F (36.7 ??C) (Oral)    Resp 18    Ht 5\' 5"  (1.651 m)    Wt 122 lb 12.8 oz (55.7 kg)    LMP  (LMP Unknown)    SpO2 100%    BMI 20.43 kg/m??     Gen:      Middle aged female. No labored breathing at rest  Eyes:     Has Pallor, No jaundice, no conjunctivitis  Mouth:   Oral mucous Membrane moist, no thrush, Lips, no cyanosis  Neck:     No cervical or supraclavicular lymphadenopathy, No thyromegaly  CVS:      S1 S2 Regular, no murmur.  Resp:     Decreased air movement bilaterally, no basal crepitation, no rhonchi  Abd:       Soft, tenderness in the left mid and lower quadrant and left renal angle.  Legs:     No pedal edema, no calf asymmetry, no calf tenderness.  Hands:   No clubbing, no  tremor  Skin:      No large Bruises, no rash  CNS:      No confusion, no facial asymmetry, normal speech.        Recent Labs      11/07/16   0218  11/07/16   0233   NA   --   141   K   --   3.9   CL   --   100   CO2   --   24   BUN   --   15   CREATININE  1.1  1.0   GLUCOSE   --   125*   CALCIUM   --   9.3       Recent Labs      11/07/16   0132   WBC  11.6*   RBC  3.81   HGB  10.5*   HCT  34.6   MCV  90.8   MCH  27.6   MCHC  30.3*   RDW  14.4   PLT  251   MPV  12.0       CT abdomen/pelvis:   Impression: ??   ?? 1. Moderate left hydroureteronephrosis with suggestion of an  obstructing 3 mm calculus at the expected location of the left  ureterovesical junction.  2. Additional punctate nonobstructing calculus at the midpole the left  kidney.  3. Increased sclerosis within the left femoral head, suggestive of  avascular necrosis.    ??     UA-not infected.  Assessment:    Principal Problem:    Kidney stone  Active Problems:    Lupus (systemic lupus erythematosus) (HCC)    Uncontrolled pain      Plan:    1. Left ureterovesical junction stone, 3 mm with left moderate hydronephrosis: Continue IV fluids. Pain control. Flomax, Urology consulted. Strain urine. UA did not reveal any infection  Wbc-11.6    2. History of lupus; on prednisone will continue.    3. Avascular necrosis of the femur: Bilateral, status post right hip replacement. Awaiting left hip replacement in future.    4. Chronic anemia hemoglobin 10.5,  likely associated with her lupus.    5. Mild rectal bleeding with hemorrhoids: Stool softeners when necessary    Disposition depending on urology recommendation.    Electronically signed by Payton MccallumSri Ranjini Torryn Hudspeth, MD on 11/07/2016 at 8:36 AM

## 2016-11-07 NOTE — ED Notes (Signed)
To CT via cart.     Gwynneth MacleodJulie Shaneese Tait, RN  11/07/16 [phone]

## 2016-11-07 NOTE — ED Notes (Signed)
Returned from CT.     Gwynneth MacleodJulie Raul Torrance, RN  11/07/16 [phone]

## 2016-11-07 NOTE — H&P (Signed)
[facility] History [facility] Physicial  Chief Complaint   Patient presents with   ??? Hemorrhoids   ??? Back Pain   ??? Abdominal Pain   ??? Emesis   ??? Urinary Retention   ??? Generalized Body Aches       ZOX:[id number]:Tina Lutz is a 35 y.o. female, having lower abdominal pain with discomfort urinating and increased urinary frequency. With this he has associated nausea and vomiting. She states she's also been having difficulty with bowel movements and this is flaring up her hemorrhoids. Patient takes Dilaudid secondary to pain from her lupus. She finished her last Dilaudid today. Denies fevers or chills. Patient is also been complaining of generalized body aches. No shortness of breath, chest pain, or coughing. The complaint has been persistent, moderate in severity, and worsened by nothing. She also reports bilateral lower back pain worse on the left.  ??    Prior to Visit Medications    Medication Sig Taking? Authorizing Provider   buPROPion (WELLBUTRIN XL) 150 MG extended release tablet Take 150 mg by mouth every morning Yes Historical Provider, MD   mirtazapine (REMERON) 15 MG tablet Take 15 mg by mouth nightly Yes Historical Provider, MD   magnesium oxide (MAG-OX) 400 MG tablet Take 400 mg by mouth daily Yes Historical Provider, MD   omeprazole (PRILOSEC) 20 MG delayed release capsule Take 20 mg by mouth Daily Yes Historical Provider, MD   hydrOXYzine (ATARAX) 25 MG tablet Take 25 mg by mouth 3 times daily as needed for Itching Yes Historical Provider, MD   Potassium Chloride Crys ER (KLOR-CON M20 PO) Take 1 tablet by mouth daily Yes Historical Provider, MD   predniSONE (DELTASONE) 5 MG tablet Take 7.5 mg by mouth daily Yes Historical Provider, MD     Social History   Substance Use Topics   ??? Smoking status: Current Every Day Smoker     Packs/day: 0.50   ??? Smokeless tobacco: Not on file   ??? Alcohol use Yes     No family history on file.  Past Surgical History:   Procedure Laterality Date   ??? DILATION AND CURETTAGE OF  UTERUS     ??? HIP SURGERY      for osteonecrosis   ??? TUBAL LIGATION       Past Medical History:   Diagnosis Date   ??? Lupus (HCC)    ??? Lupus nephritis (HCC)    ??? Osteonecrosis (HCC)     right hip   ??? Pericardial effusion without cardiac tamponade      Review of Systems   Constitutional: Negative for chills and fever.   HENT: Negative for congestion, sinus pain and sore throat.    Eyes: Negative for pain, discharge and redness.   Respiratory: Negative for cough, sputum production, shortness of breath and wheezing.    Cardiovascular: Negative for chest pain and palpitations.   Gastrointestinal: Positive for abdominal pain, constipation, nausea and vomiting.   Genitourinary: Positive for dysuria, flank pain, frequency and urgency.   Musculoskeletal: Positive for back pain and myalgias.   Skin: Negative for itching and rash.   Neurological: Negative for sensory change, speech change, focal weakness, loss of consciousness and headaches.   Psychiatric/Behavioral: Negative for suicidal ideas.     Physical Exam   Constitutional: She is oriented to person, place, and time and well-developed, well-nourished, and in no distress.   HENT:   Head: Normocephalic and atraumatic.   Eyes: Pupils are equal, round, and reactive to light.  Neck: Normal range of motion. Neck supple. No JVD present.   Cardiovascular: Normal rate, regular rhythm and normal heart sounds.  Exam reveals no friction rub.    No murmur heard.  Pulmonary/Chest: Breath sounds normal. No stridor. No respiratory distress. She has no wheezes. She exhibits no tenderness.   Abdominal: There is tenderness. There is guarding.   Generalized abdominal tenderness with guarding left flank pain.   Musculoskeletal: She exhibits tenderness. She exhibits no edema or deformity.   Lymphadenopathy:     She has cervical adenopathy.   Neurological: She is alert and oriented to person, place, and time. No cranial nerve deficit. Coordination normal.   Skin: Skin is warm and dry. No  erythema.   Psychiatric: Mood, memory, affect and judgment normal.     Active problems  Urolithiasis  Hydronephrosis  Intractable pain        Plan:  Consult urology  IV fluid  IV pain medication  Antiemetics       DVT Prophylaxis: pcd  Code Status:  Full code     Electronically signed by Griffin Basilenee M Katerine Morua, PA on 11/07/2016 at 5:19 AM

## 2016-11-07 NOTE — ED Notes (Signed)
Floor notified pt. Ready to transport.     Gwynneth MacleodJulie Arena Lindahl, RN  11/07/16 [phone]

## 2016-11-07 NOTE — ED Notes (Signed)
Report given to receiving nurse. No questions from receiving nurse. Zofran 2ML given before patient left floor.      Jone BasemanLindsey E Crouser, RN  11/07/16 [phone]

## 2016-11-07 NOTE — Progress Notes (Signed)
[facility] Quality Flow/Interdisciplinary Rounds Progress Note        Quality Flow Rounds held on November 07, 2016    Disciplines Attending:  Bedside Nurse, Social Worker, Case Manager and Nursing Unit Leadership    Carmie EndDominique Ostrosky was admitted on 11/07/2016  5:31 AM    Anticipated Discharge Date:  Expected Discharge Date: 11/09/16    Disposition:    Braden Score:  Braden Scale Score: 19    Readmission Risk              Readmission Risk:        22       Age 33 or Greater:  0    Admitted from SNF or Requires Paid or Family Care:  0    Currently has CHF,COPD,ARF,CRI,or is on dialysis:  0    Takes more than 5 Prescription Medications:  4    Takes Digoxin,Insulin,Anticoagulants,Narcotics or ASA/Plavix:  0    Hospital Admit in Past 12 Months:  10    On Disability:  3    Patient Considers own Health:  5          Discussed patient goal for the day, patient clinical progression, and barriers to discharge.  The following Goal(s) of the Day/Commitment(s) have been identified:  Continue to monitor; Strain urine      [facility] HospitalDeAnna Kanishk Stroebel  November 07, 2016

## 2016-11-07 NOTE — Plan of Care (Signed)
Problem: Pain:  Goal: Pain level will decrease  Pain level will decrease   Outcome: Ongoing    Goal: Control of acute pain  Control of acute pain   Outcome: Ongoing      Problem: Falls - Risk of  Goal: Absence of falls  Outcome: Ongoing

## 2016-11-07 NOTE — ED Provider Notes (Signed)
HPI:  11/07/16,   Time: 1:31 AM         Tina Lutz is a 35 y.o. female presenting to the ED for evaluation of multiple complaints, beginning 2 days ago. Patient states that she's been having lower abdominal pain with discomfort urinating and increased urinary frequency. With this he has associated nausea and vomiting. She states she's also been having difficulty with bowel movements and this is flaring up her hemorrhoids. Patient takes Dilaudid secondary to pain from her lupus. She finished her last Dilaudid today. Denies fevers or chills. Patient is also been complaining of generalized body aches. No shortness of breath, chest pain, or coughing. The complaint has been persistent, moderate in severity, and worsened by nothing. She also reports bilateral lower back pain worse on the left.    ROS: Review of Systems   Constitutional: Negative for chills, fever and malaise/fatigue.   Eyes: Negative for blurred vision.   Respiratory: Negative for cough and shortness of breath.    Cardiovascular: Negative for chest pain and leg swelling.   Gastrointestinal: Positive for abdominal pain, nausea and vomiting. Negative for blood in stool, diarrhea and melena.   Genitourinary: Positive for dysuria, frequency and urgency. Negative for flank pain and hematuria.   Musculoskeletal: Positive for back pain and myalgias. Negative for neck pain.   Skin: Negative for rash.   Neurological: Negative for dizziness, sensory change, focal weakness and headaches.   Endo/Heme/Allergies: Does not bruise/bleed easily.   All other systems reviewed and are negative.      Pertinent positives and negatives are stated within HPI, all other systems reviewed and are negative.  --------------------------------------------- PAST HISTORY ---------------------------------------------  Past Medical History:  has a past medical history of Lupus (HCC); Lupus nephritis (HCC); Osteonecrosis (HCC); and Pericardial effusion without cardiac  tamponade.    Past Surgical History:  has a past surgical history that includes hip surgery; Dilation and curettage of uterus; and Tubal ligation.    Social History:  reports that she has been smoking.  She has been smoking about 0.50 packs per day. She does not have any smokeless tobacco history on file. She reports that she drinks alcohol. She reports that she uses drugs, including Marijuana.    Family History: family history is not on file.     The patient???s home medications have been reviewed.    Allergies: Reglan [metoclopramide]; Alpha blocker quinazolines; Aspirin; Bactrim [sulfamethoxazole-trimethoprim]; Nsaids; Pork-derived products; Sulfa antibiotics; Sulfur; Tramadol; and Ciprofloxacin    -------------------------------------------------- RESULTS -------------------------------------------------  All laboratory and radiology results have been personally reviewed by myself   LABS:  Results for orders placed or performed during the hospital encounter of 11/07/16   CBC Auto Differential   Result Value Ref Range    WBC 11.6 (H) 4.5 - 11.5 E9/L    RBC 3.81 3.50 - 5.50 E12/L    Hemoglobin 10.5 (L) 11.5 - 15.5 g/dL    Hematocrit 64.334.6 32.934.0 - 48.0 %    MCV 90.8 80.0 - 99.9 fL    MCH 27.6 26.0 - 35.0 pg    MCHC 30.3 (L) 32.0 - 34.5 %    RDW 14.4 11.5 - 15.0 fL    Platelets 251 130 - 450 E9/L    MPV 12.0 7.0 - 12.0 fL    Neutrophils % 77.9 43.0 - 80.0 %    Immature Granulocytes % 0.3 0.0 - 5.0 %    Lymphocytes % 13.4 (L) 20.0 - 42.0 %    Monocytes % 7.3 2.0 -  12.0 %    Eosinophils % 0.9 0.0 - 6.0 %    Basophils % 0.2 0.0 - 2.0 %    Neutrophils # 9.07 (H) 1.80 - 7.30 E9/L    Immature Granulocytes # 0.04 E9/L    Lymphocytes # 1.56 1.50 - 4.00 E9/L    Monocytes # 0.85 0.10 - 0.95 E9/L    Eosinophils # 0.10 0.05 - 0.50 E9/L    Basophils # 0.02 0.00 - 0.20 E9/L   Lactic Acid, Plasma   Result Value Ref Range    Lactic Acid 1.8 0.5 - 2.2 mmol/L   Urinalysis   Result Value Ref Range    Color, UA Yellow Straw/Yellow     Clarity, UA CLOUDY (A) Clear    Glucose, Ur Negative Negative mg/dL    Bilirubin Urine Negative Negative    Ketones, Urine TRACE (A) Negative mg/dL    Specific Gravity, UA 1.020 1.005 - 1.030    Blood, Urine TRACE (A) Negative    pH, UA 7.0 5.0 - 9.0    Protein, UA 100 (A) Negative mg/dL    Urobilinogen, Urine 0.2 <2.0 E.U./dL    Nitrite, Urine Negative Negative    Leukocyte Esterase, Urine Negative Negative   Microscopic Urinalysis   Result Value Ref Range    WBC, UA 1-3 0 - 5 /HPF    RBC, UA 2-5 0 - 2 /HPF    Epi Cells MANY /HPF    Bacteria, UA RARE (A) /HPF    Amorphous, UA MANY    Comprehensive metabolic panel   Result Value Ref Range    Sodium 141 132 - 146 mmol/L    Potassium 3.9 3.5 - 5.0 mmol/L    Chloride 100 98 - 107 mmol/L    CO2 24 22 - 29 mmol/L    Anion Gap 17 (H) 7 - 16 mmol/L    Glucose 125 (H) 74 - 109 mg/dL    BUN 15 6 - 20 mg/dL    CREATININE 1.0 0.5 - 1.0 mg/dL    GFR Non-African American >60 >=60 mL/min/1.73    GFR African American >60     Calcium 9.3 8.6 - 10.2 mg/dL    Total Protein 8.5 (H) 6.4 - 8.3 g/dL    Alb 3.8 3.5 - 5.2 g/dL    Total Bilirubin 0.2 0.0 - 1.2 mg/dL    Alkaline Phosphatase 70 35 - 104 U/L    ALT 15 0 - 32 U/L    AST 20 0 - 31 U/L   Lipase   Result Value Ref Range    Lipase 41 13 - 60 U/L   POCT chem basic w/ ICA   Result Value Ref Range    POC BUN 22     POC Chloride 107     POC Creatinine 1.1     POC Glucose 124     POC Potassium 9     POC Sodium 134    POC Pregnancy Urine Qual   Result Value Ref Range    Preg Test, Ur negative     QC OK? yes        RADIOLOGY:  Interpreted by Radiologist.  CT ABDOMEN PELVIS W IV CONTRAST Additional Contrast? None    (Results Pending)       ------------------------- NURSING NOTES AND VITALS REVIEWED ---------------------------   The nursing notes within the ED encounter and vital signs as below have been reviewed.   BP (!) 150/91    Pulse 98  Temp 98.2 ??F (36.8 ??C) (Oral)    Resp 20    Ht 5\' 5"  (1.651 m)    Wt 122 lb 12.8 oz (55.7 kg)     LMP  (LMP Unknown)    SpO2 100%    BMI 20.43 kg/m??   Oxygen Saturation Interpretation: Normal      ---------------------------------------------------PHYSICAL EXAM--------------------------------------      Constitutional/General: Alert and oriented x3, well appearing, non toxic in mild to moderate distress secondary to abdominal pain  Head: NC/AT  Eyes: PERRL, EOMI  Mouth: Oropharynx clear, handling secretions,  Neck: Supple, full ROM, no meningeal signs  Pulmonary: Lungs clear to auscultation bilaterally, no wheezes, rales, or rhonchi. Not in respiratory distress  Cardiovascular:  Tachycardic rate with a regular rhythm, no murmurs, gallops, or rubs. 2+ distal pulses  Abdomen: Soft, diffuse abdominal tenderness with greatest locality in the suprapubic region., non distended,  left-sided CVA tenderness  Extremities: Moves all extremities x 4. Warm and well perfused  Skin: warm and dry without rash, no diaphoresis or pallor   Neurologic: GCS 15,  Psych: Normal Affect      ------------------------------ ED COURSE/MEDICAL DECISION MAKING----------------------  Medications   nalbuphine (NUBAIN) injection 10 mg (10 mg Intravenous Given 11/07/16 0202)   0.9 % sodium chloride bolus (0 mLs Intravenous Stopped 11/07/16 0514)   iopamidol (ISOVUE-370) 76 % injection 80 mL (80 mLs Intravenous Given 11/07/16 0301)   ondansetron (ZOFRAN) injection 8 mg (8 mg Intravenous Given 11/07/16 0228)   promethazine (PHENERGAN) injection 25 mg (25 mg Intramuscular Given 11/07/16 0333)   HYDROmorphone (DILAUDID) injection 1 mg (1 mg Intravenous Given 11/07/16 0349)         Medical Decision Making:    0218  Patient states that her abdominal pain has improved since being medicated. She is still nauseated. Will order Zofran.   [id number]  Patient states that her pain is starting to come back. I discussed the results of her labs and imaging with her. She has a kidney stone. We'll attempt pain control with Dilaudid. We'll reassess. If patient can achieve  pain control she'll be discharged home with something for nausea and pain. If patient does not achieve pain control she'll be admitted for intractable flank pain secondary to her kidney stone.   0510  Patient states the pain is coming back. She agreeable to admission for further evaluation and pain control.   [id number]  Spoke with R Rouwehya P.A. for Dr. Consuelo PandySri (Medicine).  Discussed case.  They will admit this patient.      Counseling:   The emergency provider has spoken with the patient and discussed today???s results, in addition to providing specific details for the plan of care and counseling regarding the diagnosis and prognosis.  Questions are answered at this time and they are agreeable with the plan.      --------------------------------- IMPRESSION AND DISPOSITION ---------------------------------    IMPRESSION  1. Kidney stone        DISPOSITION  Disposition: Admit to med/surg floor  Patient condition is fair                  Nils PyleMichael Gregory Dowe, DO  11/07/16 [id number]

## 2016-11-07 NOTE — Plan of Care (Cosign Needed)
Problem: Pain:  Goal: Pain level will decrease  Pain level will decrease   Outcome: Met This Shift    Goal: Control of acute pain  Control of acute pain   Outcome: Met This Shift    Goal: Control of chronic pain  Control of chronic pain   Outcome: Met This Shift      Problem: Falls - Risk of  Goal: Absence of falls  Outcome: Met This Shift

## 2016-11-07 NOTE — Consults (Addendum)
11/07/2016 12:32 PM  Service: Urology  Group: [facility] ([facility])    Tina Endominique Schnorr  [id number]     Chief Complaint: 3 mm distal left ureteral calc    History of Present Illness:  The patient is a 35 y.o. female patient who presents with left flank pain  She has not seen a urologist in the past  She has not had a stone in the past  The pain started yesterday  She does have SLE  She did have N/V  She has not had fever  She has not had gross hematuria  She states the pain is better  She is voiding well  All options were discussed  Flomax was started    Past Medical History:   Diagnosis Date   ??? Arthritis    ??? Lupus    ??? Lupus nephritis (HCC)    ??? Osteonecrosis (HCC)     right hip   ??? Pericardial effusion without cardiac tamponade    ??? Psychiatric problem        Past Surgical History:   Procedure Laterality Date   ??? DILATION AND CURETTAGE OF UTERUS     ??? HIP SURGERY      for osteonecrosis   ??? JOINT REPLACEMENT      right   ??? TUBAL LIGATION         Medications Prior to Admission:    Prescriptions Prior to Admission: buPROPion (WELLBUTRIN XL) 150 MG extended release tablet, Take 150 mg by mouth every morning  mirtazapine (REMERON) 15 MG tablet, Take 15 mg by mouth nightly  magnesium oxide (MAG-OX) 400 MG tablet, Take 400 mg by mouth daily  omeprazole (PRILOSEC) 20 MG delayed release capsule, Take 20 mg by mouth Daily  hydrOXYzine (ATARAX) 25 MG tablet, Take 25 mg by mouth 3 times daily as needed for Itching  Potassium Chloride Crys ER (KLOR-CON M20 PO), Take 1 tablet by mouth daily  predniSONE (DELTASONE) 5 MG tablet, Take 7.5 mg by mouth daily    Allergies:    Reglan [metoclopramide]; Alpha blocker quinazolines; Aspirin; Bactrim [sulfamethoxazole-trimethoprim]; Nsaids; Pork-derived products; Sulfa antibiotics; Sulfur; Tramadol; and Ciprofloxacin    Social History:    reports that she has been smoking.  She has been smoking about 0.50 packs per day. She has never used smokeless tobacco. She reports that she  uses drugs, including Marijuana. She reports that she does not drink alcohol.    Family History:   Non-contributory to this urological problem  family history is not on file.    Review of Systems:  Respiratory: negative for cough and hemoptysis  Cardiovascular: negative for chest pain and dyspnea  Gastrointestinal: negative for abdominal pain, diarrhea, nausea and vomiting  Derm: negative for rash and skin lesion(s)  Neurological: negative for seizures and tremors  Endocrine: negative for diabetic symptoms including polydipsia and polyuria  GU: As above in the HPI, otherwise negative  All other reviews are negative    Physical Exam:   Vitals: BP 130/74    Pulse 84    Temp 98 ??F (36.7 ??C) (Oral)    Resp 18    Ht 5\' 5"  (1.651 m)    Wt 122 lb 12.8 oz (55.7 kg)    LMP  (LMP Unknown)    SpO2 94%    BMI 20.43 kg/m??   General:  Awake, alert, oriented X 3.  Well developed, well nourished, well groomed.  No apparent distress.  HEENT:  Normocephalic, atraumatic.  Pupils equal, round.  No  scleral icterus.  No conjunctival injection.  Normal lips, teeth, and gums.  No nasal discharge.  Neck:  Supple, no masses.  Heart:  RRR  Lungs:  No audible wheezing.  Respirations symmetric and non-labored.  Abdomen:  soft, nontender, no masses, no organomegaly, no peritoneal signs  Extremities:  No clubbing, cyanosis, or edema  Skin:  Warm and dry, no open lesions or rashes  Neuro:  Cranial nerves 2-12 intact, no focal deficits  Rectal: deferred  Genitalia:  Foley no    Labs:   Recent Labs      11/07/16   0132   WBC  11.6*   RBC  3.81   HGB  10.5*   HCT  34.6   MCV  90.8   MCH  27.6   MCHC  30.3*   RDW  14.4   PLT  251   MPV  12.0       Recent Labs      11/07/16   0218  11/07/16   0233   CREATININE  1.1  1.0       Images:  1. Moderate left hydroureteronephrosis with suggestion of an   obstructing 3 mm calculus at the expected location of the left   ureterovesical junction.   2. Additional punctate nonobstructing calculus at the midpole the  left   kidney.   3. Increased sclerosis within the left femoral head, suggestive of   avascular necrosis.               Assessment: Tina Lutz 35 y.o. female     3 mm distal left ureteral calc  Left flank pain  Left hydronephrosis  SLE    Plan:    CT was reviewed  All options were discussed  Cont the flomax  Cont the IVF  Cont pain control  Hold on GU intervention  Surgery was discussed  RBA of the procedure was discussed  Will cont to follow    Bary CastillaMark A Delayni Streed, DO   [facility]

## 2016-11-08 LAB — CBC WITH AUTO DIFFERENTIAL
Basophils %: 0.2 % (ref 0.0–2.0)
Basophils Absolute: 0.01 E9/L (ref 0.00–0.20)
Eosinophils %: 1.6 % (ref 0.0–6.0)
Eosinophils Absolute: 0.07 E9/L (ref 0.05–0.50)
Hematocrit: 30.1 % — ABNORMAL LOW (ref 34.0–48.0)
Hemoglobin: 9.1 g/dL — ABNORMAL LOW (ref 11.5–15.5)
Immature Granulocytes #: 0.01 E9/L
Immature Granulocytes %: 0.2 % (ref 0.0–5.0)
Lymphocytes %: 24.6 % (ref 20.0–42.0)
Lymphocytes Absolute: 1.08 E9/L — ABNORMAL LOW (ref 1.50–4.00)
MCH: 27.7 pg (ref 26.0–35.0)
MCHC: 30.2 % — ABNORMAL LOW (ref 32.0–34.5)
MCV: 91.5 fL (ref 80.0–99.9)
MPV: 9.9 fL (ref 7.0–12.0)
Monocytes %: 6.2 % (ref 2.0–12.0)
Monocytes Absolute: 0.27 E9/L (ref 0.10–0.95)
Neutrophils %: 67.2 % (ref 43.0–80.0)
Neutrophils Absolute: 2.95 E9/L (ref 1.80–7.30)
Platelets: 201 E9/L (ref 130–450)
RBC: 3.29 E12/L — ABNORMAL LOW (ref 3.50–5.50)
RDW: 13.3 fL (ref 11.5–15.0)
WBC: 4.4 E9/L — ABNORMAL LOW (ref 4.5–11.5)

## 2016-11-08 LAB — BASIC METABOLIC PANEL
Anion Gap: 13 mmol/L (ref 7–16)
BUN: 9 mg/dL (ref 6–20)
CO2: 26 mmol/L (ref 22–29)
Calcium: 9 mg/dL (ref 8.6–10.2)
Chloride: 101 mmol/L (ref 98–107)
Creatinine: 0.7 mg/dL (ref 0.5–1.0)
GFR African American: >60
GFR Non-African American: >60 mL/min/{1.73_m2} (ref >=60)
Glucose: 80 mg/dL (ref 74–109)
Potassium: 3.5 mmol/L (ref 3.5–5.0)
Sodium: 140 mmol/L (ref 132–146)

## 2016-11-08 LAB — URIC ACID: Uric Acid, Serum: 3.4 mg/dL (ref 2.4–5.7)

## 2016-11-08 LAB — LACTIC ACID: Lactic Acid: 1.2 mmol/L (ref 0.5–2.2)

## 2016-11-08 MED ORDER — MELATONIN 3 MG PO TABS
3 MG | Freq: Every evening | ORAL | Status: DC | PRN
Start: 2016-11-08 — End: 2016-11-13

## 2016-11-08 MED ORDER — POLYETHYLENE GLYCOL 3350 17 G PO PACK
17 g | Freq: Every day | ORAL | Status: DC
Start: 2016-11-08 — End: 2016-11-13
  Administered 2016-11-08 – 2016-11-13 (×5): 17 g via ORAL

## 2016-11-08 MED ORDER — HYDROXYZINE HCL 25 MG PO TABS
25 MG | Freq: Three times a day (TID) | ORAL | Status: DC | PRN
Start: 2016-11-08 — End: 2016-11-13
  Administered 2016-11-08 – 2016-11-09 (×2): 25 mg via ORAL

## 2016-11-08 MED ORDER — LACTATED RINGERS IV SOLN
INTRAVENOUS | Status: DC
Start: 2016-11-08 — End: 2016-11-12
  Administered 2016-11-08 – 2016-11-12 (×9): via INTRAVENOUS

## 2016-11-08 MED FILL — HYDROXYZINE HCL 25 MG PO TABS: 25 MG | ORAL | Qty: 1

## 2016-11-08 MED FILL — PREDNISONE 5 MG PO TABS: 5 MG | ORAL | Qty: 2

## 2016-11-08 MED FILL — DILAUDID 1 MG/ML IJ SOLN: 1 MG/ML | INTRAMUSCULAR | Qty: 1

## 2016-11-08 MED FILL — POTASSIUM CHLORIDE CRYS ER 20 MEQ PO TBCR: 20 MEQ | ORAL | Qty: 1

## 2016-11-08 MED FILL — TAMSULOSIN HCL 0.4 MG PO CAPS: 0.4 MG | ORAL | Qty: 1

## 2016-11-08 MED FILL — LIDOCAINE-HYDROCORTISONE ACE 3-0.5 % RE CREA: RECTAL | Qty: 7

## 2016-11-08 MED FILL — PEG 3350 17 G PO PACK: 17 g | ORAL | Qty: 1

## 2016-11-08 MED FILL — NORMAL SALINE FLUSH 0.9 % IV SOLN: 0.9 % | INTRAVENOUS | Qty: 20

## 2016-11-08 MED FILL — MIRTAZAPINE 15 MG PO TABS: 15 MG | ORAL | Qty: 1

## 2016-11-08 MED FILL — PROTONIX 40 MG PO TBEC: 40 MG | ORAL | Qty: 1

## 2016-11-08 MED FILL — ONDANSETRON HCL 4 MG/2ML IJ SOLN: 4 MG/2ML | INTRAMUSCULAR | Qty: 2

## 2016-11-08 MED FILL — BUPROPION HCL ER (XL) 150 MG PO TB24: 150 MG | ORAL | Qty: 1

## 2016-11-08 MED FILL — MAGNESIUM OXIDE 400 (240 MG) MG PO TABS: 400 (240 Mg) MG | ORAL | Qty: 1

## 2016-11-08 NOTE — Plan of Care (Signed)
Problem: Pain:  Goal: Pain level will decrease  Pain level will decrease   Outcome: Ongoing      Problem: Falls - Risk of  Goal: Absence of falls  Outcome: Met This Shift

## 2016-11-08 NOTE — Consults (Signed)
Consult to Urology  Consult performed by: Curtistine Pettitt R  Consult ordered by: Griffin BasilOUWEYHA, RENEE M  Assessment/Recommendations: CONSULT DICTATED          Bryan Lemmaobert R. Alaysia Lightle, M.D.  11/08/2016  5:03 PM

## 2016-11-08 NOTE — Progress Notes (Signed)
[facility] Quality Flow/Interdisciplinary Rounds Progress Note        Quality Flow Rounds held on November 08, 2016    Disciplines Attending:  Bedside Nurse, Social Worker, Case Manager and Nursing Unit Leadership    Carmie EndDominique Lutz was admitted on 11/07/2016 10:55 AM    Anticipated Discharge Date:  Expected Discharge Date: 11/09/16    Disposition:    Braden Score:  Braden Scale Score: 20    Readmission Risk              Readmission Risk:        22       Age 35 or Greater:  0    Admitted from SNF or Requires Paid or Family Care:  0    Currently has CHF,COPD,ARF,CRI,or is on dialysis:  0    Takes more than 5 Prescription Medications:  4    Takes Digoxin,Insulin,Anticoagulants,Narcotics or ASA/Plavix:  0    Hospital Admit in Past 12 Months:  10    On Disability:  3    Patient Considers own Health:  5          Discussed patient goal for the day, patient clinical progression, and barriers to discharge.  The following Goal(s) of the Day/Commitment(s) have been identified:  Continue to monitor      [facility] HospitalDeAnna Marc Lutz  November 08, 2016

## 2016-11-08 NOTE — Progress Notes (Signed)
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     In ER CT abdomen revealed a 3 mm left UVJ stone. With the left-sided moderate hydronephrosis.    She continues with left-sided pain. No vomiting this morning.  She was seen by urology yesterday. Did not have immediate plans for intervention.    Today morning she again complains of left-sided flank pain.  Complaining of constipation and wanted to take MiraLAX.  Also requesting medication for sleep at bedtime.    ??? buPROPion  150 mg Oral QAM   ??? magnesium oxide  400 mg Oral Daily   ??? mirtazapine  15 mg Oral Nightly   ??? pantoprazole  40 mg Oral QAM AC   ??? predniSONE  7.5 mg Oral Daily   ??? potassium chloride  20 mEq Oral Daily   ??? sodium chloride flush  10 mL Intravenous 2 times per day   ??? phenazopyridine  200 mg Oral TID WC   ??? tamsulosin  0.4 mg Oral Daily       sodium chloride flush 10 mL PRN   acetaminophen 650 mg Q4H PRN   magnesium hydroxide 30 mL Daily PRN   ondansetron 4 mg Q6H PRN   HYDROmorphone 1 mg Q3H PRN   Lidocaine-Hydrocortisone Ace 1 applicator PRN   hydrOXYzine 25 mg TID PRN        Objective:    BP 118/68    Pulse 89    Temp 98.2 ??F (36.8 ??C)    Resp 18    Ht 5\' 5"  (1.651 m)    Wt 122 lb 12.8 oz (55.7 kg)    LMP  (LMP Unknown)    SpO2 96%    BMI 20.43 kg/m??     Gen:      Middle aged female. No labored breathing at rest  Eyes:     Has Pallor, No jaundice, no conjunctivitis  Mouth:   Oral mucous Membrane moist, no thrush, Lips, no cyanosis  Neck:     No cervical or supraclavicular lymphadenopathy, No thyromegaly  CVS:      S1 S2 Regular, no murmur.  Resp:     Decreased air movement bilaterally, no basal crepitation, no rhonchi  Abd:       Soft, tenderness in the left mid and lower quadrant and left renal angle.  Legs:      No pedal edema, no calf asymmetry, no calf tenderness.  Hands:   No clubbing, no tremor  Skin:      No large Bruises, no rash  CNS:      No confusion, no facial asymmetry, normal speech.        Recent Labs      11/07/16   0218  11/07/16   0233  11/08/16   0359   NA   --   141  140   K   --   3.9  3.5   CL   --   100  101   CO2   --   24  26   BUN   --   15  9   CREATININE  1.1  1.0  0.7   GLUCOSE   --   125*  80   CALCIUM   --  9.3  9.0       Recent Labs      11/07/16   0132  11/08/16   0359   WBC  11.6*  4.4*   RBC  3.81  3.29*   HGB  10.5*  9.1*   HCT  34.6  30.1*   MCV  90.8  91.5   MCH  27.6  27.7   MCHC  30.3*  30.2*   RDW  14.4  13.3   PLT  251  201   MPV  12.0  9.9       CT abdomen/pelvis:   Impression: ??   ?? 1. Moderate left hydroureteronephrosis with suggestion of an  obstructing 3 mm calculus at the expected location of the left  ureterovesical junction.  2. Additional punctate nonobstructing calculus at the midpole the left  kidney.  3. Increased sclerosis within the left femoral head, suggestive of  avascular necrosis.    ??     UA-not infected.  Assessment:    Principal Problem:    Kidney stone  Active Problems:    Lupus (systemic lupus erythematosus) (HCC)    Uncontrolled pain      Plan:    1. Left ureterovesical junction stone, 3 mm with left moderate hydronephrosis: Continue IV fluids. Pain control. Flomax, Urology consulted. Strain urine. UA did not reveal any infection  Wbc-4.4, follow-up on urology recommendation.    2. History of lupus; on prednisone will continue. She does have  pain in small joints of the hand which is expected.    3. Avascular necrosis of the femur: Bilateral, status post right hip replacement. Awaiting left hip replacement in future.    4. Chronic anemia hemoglobin 10.5 to 9.1,ikely associated with her lupus. Dropped due to IV fluids, dilutional. She does not have any bleeding tendency.    5. Mild rectal bleeding with hemorrhoids: Stool softeners, we will give MiraLAX if  she is not going to have any surgical intervention.    Disposition depending on urology recommendation/ Central medical improvement in her left flank pain..    Electronically signed by Payton MccallumSri Ranjini Amyrie Illingworth, MD on 11/08/2016 at 11:11 AM

## 2016-11-08 NOTE — Progress Notes (Signed)
[facility]                                                           PROGRESS NOTE                                                                       11/08/2016          SUBJECTIVE:    Afebrile  Pt had further pain today  Has not passed stone   Nausea better    OBJECTIVE:    BP 106/66    Pulse 91    Temp 98.6 ??F (37 ??C)    Resp 16    Ht 5\' 5"  (1.651 m)    Wt 132 lb (59.9 kg)    LMP  (LMP Unknown)    SpO2 96%    BMI 21.97 kg/m??     PHYSICAL EXAMINATION:  Skin: dry, without rashes  Respirations: non-labored, intact  Abdomen: soft, non-tender, non-distended      Lab Results   Component Value Date    WBC 4.4 (L) 11/08/2016    HGB 9.1 (L) 11/08/2016    HCT 30.1 (L) 11/08/2016    MCV 91.5 11/08/2016    PLT 201 11/08/2016       Lab Results   Component Value Date    CREATININE 0.7 11/08/2016       No results found for: PSA    REVIEW OF SYSTEMS:    CONSTITUTIONAL: negative  HEENT: negative  HEMATOLOGIC: negative  ENDOCRINE: negative  RESPIRATORY: negative  CV: negative  GI: negative  NEURO: negative  ORTHOPEDICS: negative  PSYCHIATRIC: negative  GU: as above    PAST FAMILY HISTORY:  History reviewed. No pertinent family history.  PAST SOCIAL HISTORY:    Social History     Social History   ??? Marital status: Single     Spouse name: N/A   ??? Number of children: N/A   ??? Years of education: N/A     Social History Main Topics   ??? Smoking status: Current Every Day Smoker     Packs/day: 0.50   ??? Smokeless tobacco: Never Used   ??? Alcohol use No   ??? Drug use:      Types: Marijuana      Comment: daily   ??? Sexual activity: Not Asked     Other Topics Concern   ??? None     Social History Narrative   ??? None       Scheduled Meds:  ??? polyethylene glycol  17 g Oral Daily   ??? buPROPion  150 mg Oral QAM   ??? magnesium oxide  400 mg Oral Daily   ??? mirtazapine  15 mg Oral Nightly   ??? pantoprazole  40 mg Oral QAM AC   ??? predniSONE  7.5 mg Oral Daily   ??? potassium chloride  20 mEq Oral Daily    ??? sodium chloride flush  10 mL Intravenous 2 times per day   ??? tamsulosin  0.4 mg Oral Daily  Continuous Infusions:  ??? lactated ringers     ??? sodium chloride 100 mL/hr at 11/07/16 1351     PRN Meds:.melatonin, sodium chloride flush, acetaminophen, magnesium hydroxide, ondansetron, HYDROmorphone, Lidocaine-Hydrocortisone Ace, hydrOXYzine    ASSESSMENT:    Patient Active Problem List   Diagnosis   ??? Lupus (systemic lupus erythematosus) (HCC)   ??? Uncontrolled pain   ??? Proteinuria   ??? Kidney stone       PLAN:  Will keep pt npo tonight  If further severe pain occurs will do cystoscopy and stone removal  If pain abates will consider discharge tomorrow       Jacki Conesobert R Divonte Senger, M.D.  11/08/2016  5:05 PM

## 2016-11-08 NOTE — Plan of Care (Cosign Needed)
Problem: Pain:  Goal: Pain level will decrease  Pain level will decrease   Outcome: Met This Shift    Goal: Control of acute pain  Control of acute pain   Outcome: Met This Shift    Goal: Control of chronic pain  Control of chronic pain   Outcome: Met This Shift      Problem: Falls - Risk of  Goal: Absence of falls  Outcome: Met This Shift

## 2016-11-09 ENCOUNTER — Inpatient Hospital Stay: Admit: 2016-11-09

## 2016-11-09 ENCOUNTER — Encounter: Attending: Urology

## 2016-11-09 LAB — PREGNANCY, URINE: HCG(Urine) Pregnancy Test: NEGATIVE

## 2016-11-09 LAB — MICROSCOPIC URINALYSIS

## 2016-11-09 LAB — URINALYSIS
Bilirubin Urine: NEGATIVE
Glucose, Ur: 250 mg/dL — AB
Ketones, Urine: NEGATIVE mg/dL
Nitrite, Urine: POSITIVE — AB
Protein, UA: 30 mg/dL — AB
Specific Gravity, UA: 1.015 (ref 1.005–1.030)
Urobilinogen, Urine: 4 E.U./dL — AB (ref <2.0)
pH, UA: 6.5 (ref 5.0–9.0)

## 2016-11-09 MED ORDER — HYDROMORPHONE HCL 1 MG/ML IJ SOLN
1 MG/ML | INTRAMUSCULAR | Status: DC | PRN
Start: 2016-11-09 — End: 2016-11-09

## 2016-11-09 MED ORDER — MEPERIDINE HCL 25 MG/ML IJ SOLN
25 MG/ML | INTRAMUSCULAR | Status: AC
Start: 2016-11-09 — End: 2016-11-09

## 2016-11-09 MED ORDER — ONDANSETRON HCL 4 MG/2ML IJ SOLN
4 MG/2ML | Freq: Once | INTRAMUSCULAR | Status: AC | PRN
Start: 2016-11-09 — End: 2016-11-09
  Administered 2016-11-09: 17:00:00 4 mg via INTRAVENOUS

## 2016-11-09 MED ORDER — MEPERIDINE HCL 25 MG/ML IJ SOLN
25 MG/ML | INTRAMUSCULAR | Status: DC | PRN
Start: 2016-11-09 — End: 2016-11-10
  Administered 2016-11-09 (×2): 12.5 mg via INTRAVENOUS

## 2016-11-09 MED ORDER — CEFAZOLIN SODIUM-DEXTROSE 2-3 GM-% IV SOLR
2-3 | INTRAVENOUS | Status: AC
Start: 2016-11-09 — End: 2016-11-09

## 2016-11-09 MED ORDER — HYDROCODONE-ACETAMINOPHEN 5-325 MG PO TABS
5-325 MG | ORAL_TABLET | ORAL | 0 refills | Status: DC | PRN
Start: 2016-11-09 — End: 2016-11-13

## 2016-11-09 MED ORDER — CEPHALEXIN 500 MG PO CAPS
500 MG | ORAL_CAPSULE | Freq: Three times a day (TID) | ORAL | 0 refills | Status: DC
Start: 2016-11-09 — End: 2016-11-13

## 2016-11-09 MED ORDER — FENTANYL CITRATE (PF) 100 MCG/2ML IJ SOLN
100 MCG/2ML | INTRAMUSCULAR | Status: DC | PRN
Start: 2016-11-09 — End: 2016-11-09

## 2016-11-09 MED ORDER — NORMAL SALINE FLUSH 0.9 % IV SOLN
0.9 % | Freq: Two times a day (BID) | INTRAVENOUS | Status: DC
Start: 2016-11-09 — End: 2016-11-09
  Administered 2016-11-09: 03:00:00 10 mL via INTRAVENOUS

## 2016-11-09 MED ORDER — NORMAL SALINE FLUSH 0.9 % IV SOLN
0.9 % | INTRAVENOUS | Status: DC | PRN
Start: 2016-11-09 — End: 2016-11-09

## 2016-11-09 MED ORDER — ONDANSETRON HCL 4 MG/2ML IJ SOLN
4 MG/2ML | INTRAMUSCULAR | Status: AC
Start: 2016-11-09 — End: 2016-11-09
  Administered 2016-11-09: 22:00:00 4

## 2016-11-09 MED ORDER — PROPOFOL 500 MG/50ML IV EMUL
500 | INTRAVENOUS | Status: AC
Start: 2016-11-09 — End: 2016-11-09

## 2016-11-09 MED ORDER — FENTANYL CITRATE (PF) 250 MCG/5ML IJ SOLN
250 | INTRAMUSCULAR | Status: AC
Start: 2016-11-09 — End: 2016-11-09

## 2016-11-09 MED ORDER — IOPAMIDOL 51 % IV SOLN
51 % | Freq: Once | INTRAVENOUS | Status: AC | PRN
Start: 2016-11-09 — End: 2016-11-09
  Administered 2016-11-09: 16:00:00 15 mL

## 2016-11-09 MED ORDER — MIDAZOLAM HCL 2 MG/2ML IJ SOLN
2 | INTRAMUSCULAR | Status: AC
Start: 2016-11-09 — End: 2016-11-09

## 2016-11-09 MED FILL — FENTANYL CITRATE (PF) 250 MCG/5ML IJ SOLN: 250 MCG/5ML | INTRAMUSCULAR | Qty: 5

## 2016-11-09 MED FILL — MIDAZOLAM HCL 2 MG/2ML IJ SOLN: 2 MG/ML | INTRAMUSCULAR | Qty: 2

## 2016-11-09 MED FILL — NORMAL SALINE FLUSH 0.9 % IV SOLN: 0.9 % | INTRAVENOUS | Qty: 30

## 2016-11-09 MED FILL — PREDNISONE 5 MG PO TABS: 5 MG | ORAL | Qty: 2

## 2016-11-09 MED FILL — HYDROXYZINE HCL 25 MG PO TABS: 25 MG | ORAL | Qty: 1

## 2016-11-09 MED FILL — DIPRIVAN 500 MG/50ML IV EMUL: 500 MG/50ML | INTRAVENOUS | Qty: 50

## 2016-11-09 MED FILL — POTASSIUM CHLORIDE CRYS ER 20 MEQ PO TBCR: 20 MEQ | ORAL | Qty: 1

## 2016-11-09 MED FILL — DILAUDID 1 MG/ML IJ SOLN: 1 MG/ML | INTRAMUSCULAR | Qty: 1

## 2016-11-09 MED FILL — ONDANSETRON HCL 4 MG/2ML IJ SOLN: 4 MG/2ML | INTRAMUSCULAR | Qty: 2

## 2016-11-09 MED FILL — MAGNESIUM OXIDE 400 (240 MG) MG PO TABS: 400 (240 Mg) MG | ORAL | Qty: 1

## 2016-11-09 MED FILL — BUPROPION HCL ER (XL) 150 MG PO TB24: 150 MG | ORAL | Qty: 1

## 2016-11-09 MED FILL — CEFAZOLIN SODIUM-DEXTROSE 2-3 GM-% IV SOLR: 2-3 GM-% | INTRAVENOUS | Qty: 50

## 2016-11-09 MED FILL — MIRTAZAPINE 15 MG PO TABS: 15 MG | ORAL | Qty: 1

## 2016-11-09 MED FILL — NORMAL SALINE FLUSH 0.9 % IV SOLN: 0.9 % | INTRAVENOUS | Qty: 20

## 2016-11-09 MED FILL — TAMSULOSIN HCL 0.4 MG PO CAPS: 0.4 MG | ORAL | Qty: 1

## 2016-11-09 MED FILL — MEPERIDINE HCL 25 MG/ML IJ SOLN: 25 MG/ML | INTRAMUSCULAR | Qty: 1

## 2016-11-09 NOTE — Anesthesia Post-Procedure Evaluation (Signed)
Anesthesia Post-op Note    Department of Anesthesiology  Post-Anesthesia Note    Name:  Carmie EndDominique Funderburke                                         Age:  35 y.o.  MRN:  [id number]     Last Vitals:  BP 131/73   Pulse 55   Temp 98.5 F (36.9 C) (Oral)   Resp 20   Ht 5\' 5"  (1.651 m)   Wt 134 lb 14.4 oz (61.2 kg)   LMP  (LMP Unknown)   SpO2 95%   BMI 22.45 kg/m   No data found.      Level of Consciousness:  Awake    Respiratory:  Stable    Oxygen Saturation:  Stable    Cardiovascular:  Stable    Hydration:  Adequate    PONV:  Stable    Post-op Pain:  Adequate analgesia    Post-op Assessment:  No apparent anesthetic complications    Additional Follow-Up / Treatment / Comment:  None    Myriam ForehandVictor C Jereme Loren, MD  November 09, 2016   11:25 AM  Anesthesia Post Evaluation    Myriam ForehandVictor C Rochanda Harpham, MD  11:25 AM

## 2016-11-09 NOTE — Anesthesia Pre-Procedure Evaluation (Signed)
Department of Anesthesiology  Preprocedure Note       Name:  Blakeleigh Domek   Age:  35 y.o.  DOB:  1981/04/04                                          MRN:  [id number]         Date:  11/09/2016      Surgeon:  Memo    Procedure:  Cysto, stent, lithotripsy    Medications prior to admission:   Prior to Admission medications    Medication Sig Start Date End Date Taking? Authorizing Provider   buPROPion (WELLBUTRIN XL) 150 MG extended release tablet Take 150 mg by mouth every morning   Yes Historical Provider, MD   mirtazapine (REMERON) 15 MG tablet Take 15 mg by mouth nightly   Yes Historical Provider, MD   magnesium oxide (MAG-OX) 400 MG tablet Take 400 mg by mouth daily   Yes Historical Provider, MD   omeprazole (PRILOSEC) 20 MG delayed release capsule Take 20 mg by mouth Daily   Yes Historical Provider, MD   hydrOXYzine (ATARAX) 25 MG tablet Take 25 mg by mouth 3 times daily as needed for Itching   Yes Historical Provider, MD   Potassium Chloride Crys ER (KLOR-CON M20 PO) Take 1 tablet by mouth daily   Yes Historical Provider, MD   predniSONE (DELTASONE) 5 MG tablet Take 7.5 mg by mouth daily   Yes Historical Provider, MD       Current medications:    Current Facility-Administered Medications   Medication Dose Route Frequency Provider Last Rate Last Dose   . sodium chloride flush 0.9 % injection 10 mL  10 mL Intravenous 2 times per day Mark A Memo, DO   10 mL at 11/08/16 2133   . sodium chloride flush 0.9 % injection 10 mL  10 mL Intravenous PRN Mark A Memo, DO       . polyethylene glycol (GLYCOLAX) packet 17 g  17 g Oral Daily Payton Mccallum, MD   17 g at 11/08/16 1745   . melatonin tablet 3 mg  3 mg Oral Nightly PRN [place], MD       . lactated ringers infusion   Intravenous Continuous Jacki Cones, MD 125 mL/hr at 11/09/16 0031     . buPROPion (WELLBUTRIN XL) extended release tablet 150 mg  150 mg Oral QAM Griffin Basil, PA   150 mg at 11/08/16 1130   . magnesium oxide (MAG-OX)  tablet 400 mg  400 mg Oral Daily Griffin Basil, PA   400 mg at 11/08/16 0933   . mirtazapine (REMERON) tablet 15 mg  15 mg Oral Nightly Griffin Basil, PA   15 mg at 11/08/16 2120   . pantoprazole (PROTONIX) tablet 40 mg  40 mg Oral QAM AC Griffin Basil, PA   40 mg at 11/08/16 0610   . predniSONE (DELTASONE) tablet 7.5 mg  7.5 mg Oral Daily Griffin Basil, PA   7.5 mg at 11/08/16 0932   . potassium chloride (KLOR-CON M) extended release tablet 20 mEq  20 mEq Oral Daily Griffin Basil, PA   20 mEq at 11/08/16 0932   . sodium chloride flush 0.9 % injection 10 mL  10 mL Intravenous 2 times per day Griffin Basil, PA       .  sodium chloride flush 0.9 % injection 10 mL  10 mL Intravenous PRN Griffin Basil, PA   10 mL at 11/07/16 0656   . acetaminophen (TYLENOL) tablet 650 mg  650 mg Oral Q4H PRN Griffin Basil, PA       . magnesium hydroxide (MILK OF MAGNESIA) 400 MG/5ML suspension 30 mL  30 mL Oral Daily PRN Griffin Basil, PA       . ondansetron (ZOFRAN) injection 4 mg  4 mg Intravenous Q6H PRN Griffin Basil, PA   4 mg at 11/09/16 0716   . 0.9 % sodium chloride infusion   Intravenous Continuous Payton Mccallum, MD 100 mL/hr at 11/07/16 1351     . HYDROmorphone (DILAUDID) injection 1 mg  1 mg Intravenous Q3H PRN Griffin Basil, PA   1 mg at 11/09/16 0716   . tamsulosin (FLOMAX) capsule 0.4 mg  0.4 mg Oral Daily Mark A Memo, DO   0.4 mg at 11/08/16 0933   . Lidocaine-Hydrocortisone Ace 3-0.5 % CREA 1 applicator  1 applicator Rectal PRN Payton Mccallum, MD   1 applicator at 11/07/16 2137   . hydrOXYzine (ATARAX) tablet 25 mg  25 mg Oral TID PRN Griffin Basil, PA   25 mg at 11/08/16 2130       Allergies:    Allergies   Allergen Reactions   . Reglan [Metoclopramide] Anaphylaxis     Body started contracting, couldn't breathe, throat swelled up   . Alpha Blocker Quinazolines Hives and Nausea Only   . Aspirin Other (See Comments)     Contraindicated with Lupus   . Bactrim  [Sulfamethoxazole-Trimethoprim] Other (See Comments)     Inflames lupus   . Nsaids Other (See Comments)     Kidney issues  Flares up her lupus   . Pork-Derived Products    . Sulfa Antibiotics    . Sulfur      Flares up her lupus   . Tramadol Other (See Comments)     Lupus flare   . Ciprofloxacin Diarrhea and Nausea And Vomiting   . Morphine Nausea And Vomiting       Problem List:    Patient Active Problem List   Diagnosis Code   . Lupus (systemic lupus erythematosus) (HCC) M32.9   . Uncontrolled pain R52   . Proteinuria R80.9   . Kidney stone N20.0       Past Medical History:        Diagnosis Date   . Arthritis    . Lupus    . Lupus nephritis (HCC)    . Osteonecrosis (HCC)     right hip   . Pericardial effusion without cardiac tamponade    . Psychiatric problem        Past Surgical History:        Procedure Laterality Date   . DILATION AND CURETTAGE OF UTERUS     . HIP SURGERY      for osteonecrosis   . JOINT REPLACEMENT      right   . TUBAL LIGATION         Social History:    Social History   Substance Use Topics   . Smoking status: Current Every Day Smoker     Packs/day: 0.50   . Smokeless tobacco: Never Used   . Alcohol use No  Ready to quit: Not Answered  Counseling given: Not Answered      Vital Signs (Current):   Vitals:    11/08/16 2339 11/09/16 0027 11/09/16 0705 11/09/16 0714   BP: 119/75  131/73    Pulse: 78  55    Resp: 16  20    Temp: 98.4 F (36.9 C)  98.5 F (36.9 C)    TempSrc: Oral  Oral    SpO2:  95% 95%    Weight:    134 lb 14.4 oz (61.2 kg)   Height:                                                  BP Readings from Last 3 Encounters:   11/09/16 131/73   06/07/15 125/90       NPO Status: Time of last liquid consumption: 2330                        Time of last solid consumption: 2100                        Date of last liquid consumption: 11/08/16                        Date of last solid food consumption: 11/08/16    BMI:   Wt Readings from Last 3 Encounters:    11/09/16 134 lb 14.4 oz (61.2 kg)   06/05/15 115 lb (52.2 kg)     Body mass index is 22.45 kg/m.    Anesthesia Evaluation  Patient summary reviewed  Airway: Mallampati: II     Neck ROM: full  Mouth opening: > = 3 FB Dental: normal exam         Pulmonary:normal exam                               Cardiovascular:  Exercise tolerance: good (>4 METS),           Rhythm: regular  Rate: normal                    Neuro/Psych:   (+) psychiatric history:            GI/Hepatic/Renal:   (+) renal disease (lupus nephritis): CRI and kidney stones,           Endo/Other:    (+) : arthritis:.                    Comments: Lupus Abdominal:           Vascular:                                   Vital Signs (Current)   Vitals:    11/09/16 0705   BP: 131/73   Pulse: 55   Resp: 20   Temp: 98.5 F (36.9 C)   SpO2: 95%     Vital Signs Statistics (for past 48 hrs)     Temp  Avg: 98.4 F (36.9 C)  Min: 98.2 F (36.8 C)   Min taken time: 11/08/16 0734  Max: 98.6 F (37 C)  Max taken time: 11/08/16 1508  Pulse  Avg: 83.3  Min: 55   Min taken time: 11/09/16 0705  Max: 97   Max taken time: 11/07/16 1528  Resp  Avg: 17.3  Min: 16   Min taken time: 11/08/16 2339  Max: 20   Max taken time: 11/09/16 0705  BP  Min: 92/60   Min taken time: 11/07/16 1528  Max: 131/73   Max taken time: 11/09/16 0705  SpO2  Avg: 95.8 %  Min: 95 %   Min taken time: 11/09/16 0705  Max: 98 %   Max taken time: 11/07/16 1528    BP Readings from Last 3 Encounters:   11/09/16 131/73   06/07/15 125/90     BMI  Body mass index is 22.45 kg/m.  Estimated body mass index is 22.45 kg/m as calculated from the following:    Height as of this encounter: 5\' 5"  (1.651 m).    Weight as of this encounter: 134 lb 14.4 oz (61.2 kg).    CBC   Lab Results   Component Value Date    WBC 4.4 11/08/2016    RBC 3.29 11/08/2016    HGB 9.1 11/08/2016    HCT 30.1 11/08/2016    MCV 91.5 11/08/2016    RDW 13.3 11/08/2016    PLT 201 11/08/2016     CMP    Lab Results   Component Value Date    NA 140  11/08/2016    K 3.5 11/08/2016    CL 101 11/08/2016    CO2 26 11/08/2016    BUN 9 11/08/2016    CREATININE 0.7 11/08/2016    GFRAA >60 11/08/2016    LABGLOM >60 11/08/2016    GLUCOSE 80 11/08/2016    PROT 8.5 11/07/2016    CALCIUM 9.0 11/08/2016    BILITOT 0.2 11/07/2016    ALKPHOS 70 11/07/2016    AST 20 11/07/2016    ALT 15 11/07/2016     BMP    Lab Results   Component Value Date    NA 140 11/08/2016    K 3.5 11/08/2016    CL 101 11/08/2016    CO2 26 11/08/2016    BUN 9 11/08/2016    CREATININE 0.7 11/08/2016    CALCIUM 9.0 11/08/2016    GFRAA >60 11/08/2016    LABGLOM >60 11/08/2016    GLUCOSE 80 11/08/2016     POCGlucose  Recent Labs      11/07/16   0233  11/08/16   0359   GLUCOSE  125*  80      Coags  No results found for: PROTIME, INR, APTT  HCG (If Applicable)   Lab Results   Component Value Date    PREGTESTUR NEGATIVE 11/09/2016      ABGs No results found for: PHART, PO2ART, PCO2ART, HCO3ART, BEART, O2SATART   Type & Screen (If Applicable)  No results found for: [facility]  Radiology (If Applicable)  Cardiac Testing (If Applicable)   EKG (If Applicable)      Anesthesia Plan      MAC and general     ASA 2     (Morning dose of prednisone to be given pre-op.  Pt notes history of requiring higher dose of anesthetic for prior surgeries.  )  Induction: intravenous.    MIPS: Postoperative opioids intended.  Anesthetic plan and risks discussed with patient.      Plan discussed with CRNA.  Gentry Roch, MD   11/09/2016

## 2016-11-09 NOTE — Op Note (Signed)
[facility]                     [address] [place], [place] [id number]                                 OPERATIVE REPORT    PATIENT NAME: Tina Lutz, Kamariah                  DOB:        1981/11/19  MED REC NO:   [id number]                            ROOM:       0314  ACCOUNT NO:   [id number]                          ADMIT DATE: 11/07/2016  PROVIDER:     Venita Sheffieldichard Larisa Lanius, MD    DATE OF PROCEDURE:  11/09/2016    PREOPERATIVE DIAGNOSES:  Left distal ureteral calculus and additional renal  calculi.    POSTOPERATIVE DIAGNOSES:  A 1 mm stone lower pole calyx which was freed  from its wall attachment and extraction of a 2 mm stone distal left ureter.    OPERATION:  Cystopanendoscopy, retrograde pyelogram, left ureteroscopy,  stone extraction, and stent insertion on a string.    ANESTHESIA:  Monitored sedation.    CONDITION:  Stable.    COMPLICATIONS:  None.    DISPOSITION:  Back to home today with the stent on a string.    SURGEON:  Venita Sheffieldichard Shayanna Thatch, MD    OPERATIVE PROCEDURE:  The time-out was read by me, the Anesthesia and the  operating staff, reviewed history, physical, allergy, and medication.  We  were all in agreement.  The patient was placed in lithotomy position,  prepped and draped in usual fashion, 2 gm of Ancef upon induction with the  patient placed in with no undue tension on the hips, knees, or buttocks.   Urine obtained for culture.  Examination of the bladder with a 70- and  30-degree angle lens shows no stone, clots, or foreign bodies.  The entire  mucosa was well visualized.  The mucosa was not trabeculated, no cellule or  diverticular formation.  There was nothing needed to be biopsied and mucosa  was normal.  Bladder capacity was over 300 mL, which was normal.  There was  no cystitis cystica, enterovesical fistula, or extrinsic imprint.  The  trigone was well developed.  Both ureteral orifices appeared to be negative  and non-refluxing.  The left ureteral orifice did  not look patulous.  Scout  film of the abdomen demonstrates no stones in the estimated areas of the  kidneys and ureters.  A 5 open-ended catheter was inserted into the right  ureteral orifice.  There was initially some _____ about 2 cm.  Retrograde  pyelogram demonstrates some dilatation of the ureter above the narrow  distal ureter.  I was able to finally get a Glidewire into the renal  pelvis.  The urine from the renal pelvis was grossly clear.  Retrograde  pyelogram did demonstrate mild hydronephrosis.  A sensor wire was placed  alongside the Glidewire.  A rigid scope was used.  I could see the stone in  the distal ureter.  It was grasped with  a Production designer, theatre/television/filmegura basket and sent for  analysis.  The rest of the ureter was normal.  With flexible scope and with  a saline irrigant, I examined the every calyx.  I could see a small stone  in the lower pole calyx.  It was 1 mm attached to the wall and I was able  to shape it free and there was _____ irrigant.  The rest of the every calyx  was normal.  There was no plaque formation in the calices.  A 6 x 28  J-stent was inserted into the renal and positioned with fluoroscopy of the  bladder under direct visualization.  Stent was left on a string and taped  to abdominal wall.  Abdominovaginal exam demonstrates no cystocele, cervix  is intact, no rectocele.  The cul-de-sac is negative.  Rectal, good anal  tone, no hemorrhoids, no masses, no impaction.  The patient will be  discharged today on Norco, even though she is allergic to HYDROCODONE and  CEPHALEXIN.  She will remove the stent on the string on Monday.  [place]  She should have a metabolic evaluation at a  later time.        Venita SheffieldICHARD Tavaras Goody, MD    D: 11/09/2016 12:10:11       T: 11/09/2016 14:01:52     [email or web address]  Job#: [id number]     Doc#: [id number]    CC:  Venita Sheffieldichard Erum Cercone, MD

## 2016-11-09 NOTE — Progress Notes (Signed)
[facility] Hospitalist Progress Note    Subjective:    He continued to complain of left-sided abdominal and flank pain. States that she has not passed the stone yet. No gross hematuria noted. No fever spike.    Urology has plan to take her to OR today.      ??? sodium chloride flush  10 mL Intravenous 2 times per day   ??? polyethylene glycol  17 g Oral Daily   ??? buPROPion  150 mg Oral QAM   ??? magnesium oxide  400 mg Oral Daily   ??? mirtazapine  15 mg Oral Nightly   ??? pantoprazole  40 mg Oral QAM AC   ??? predniSONE  7.5 mg Oral Daily   ??? potassium chloride  20 mEq Oral Daily   ??? sodium chloride flush  10 mL Intravenous 2 times per day   ??? tamsulosin  0.4 mg Oral Daily       sodium chloride flush 10 mL PRN   melatonin 3 mg Nightly PRN   sodium chloride flush 10 mL PRN   acetaminophen 650 mg Q4H PRN   magnesium hydroxide 30 mL Daily PRN   ondansetron 4 mg Q6H PRN   HYDROmorphone 1 mg Q3H PRN   Lidocaine-Hydrocortisone Ace 1 applicator PRN   hydrOXYzine 25 mg TID PRN        Objective:    BP 131/73    Pulse 55    Temp 98.5 ??F (36.9 ??C) (Oral)    Resp 20    Ht 5\' 5"  (1.651 m)    Wt 134 lb 14.4 oz (61.2 kg)    LMP  (LMP Unknown)    SpO2 95%    BMI 22.45 kg/m??     Gen:      Middle aged female. No labored breathing at rest  Eyes:     Has Pallor, No jaundice, no conjunctivitis  Mouth:   Oral mucous Membrane moist, no thrush, Lips, no cyanosis  Neck:     No cervical or supraclavicular lymphadenopathy, No thyromegaly  CVS:      S1 S2 Regular, no murmur.  Resp:     Decreased air movement bilaterally, no basal crepitation, no rhonchi  Abd:       Soft, tenderness in the left mid and lower quadrant and left renal angle.  Legs:     No pedal edema, no calf asymmetry, no calf tenderness.  Hands:   No clubbing, no tremor  Skin:      No large Bruises, no rash  CNS:      No confusion, no facial asymmetry, normal speech.        Recent Labs      11/07/16   0218  11/07/16   0233  11/08/16   0359   NA   --   141  140   K   --   3.9  3.5   CL   --   100   101   CO2   --   24  26   BUN   --   15  9   CREATININE  1.1  1.0  0.7   GLUCOSE   --   125*  80   CALCIUM   --   9.3  9.0       Recent Labs      11/07/16   0132  11/08/16   0359   WBC  11.6*  4.4*   RBC  3.81  3.29*  HGB  10.5*  9.1*   HCT  34.6  30.1*   MCV  90.8  91.5   MCH  27.6  27.7   MCHC  30.3*  30.2*   RDW  14.4  13.3   PLT  251  201   MPV  12.0  9.9       CT abdomen/pelvis:   Impression: ??   ?? 1. Moderate left hydroureteronephrosis with suggestion of an  obstructing 3 mm calculus at the expected location of the left  ureterovesical junction.  2. Additional punctate nonobstructing calculus at the midpole the left  kidney.  3. Increased sclerosis within the left femoral head, suggestive of  avascular necrosis.    ??       Assessment:    Principal Problem:    Kidney stone  Active Problems:    Lupus (systemic lupus erythematosus) (HCC)    Uncontrolled pain      Plan:    1. Left ureterovesical junction stone, 3 mm with left moderate hydronephrosis: Has not passed dystonia. Continue IV fluids. Pain control. Flomax, Urology consulted. Strain urine. UA did not reveal any infection, Wbc-4.4, urology plan to take her to or for intervention today.    2. History of lupus; on prednisone will continue. She does have  pain in small joints of the hand which is expected.    3. Avascular necrosis of the femur: Bilateral, status post right hip replacement. Awaiting left hip replacement in near future.    4. Chronic anemia hemoglobin 10.5 to 9.1,ikely associated with her lupus. Dropped due to IV fluids, dilutional. She does not have any bleeding, no hematuria    5. Mild rectal bleeding with hemorrhoids: Stool softeners, gave miralax. Still constipated.    Disposition depending on urology recommendation after the procedure today.    Electronically signed by Payton MccallumSri Ranjini Jamilet Ambroise, MD on 11/09/2016 at 9:25 AM

## 2016-11-09 NOTE — Brief Op Note (Signed)
Brief Postoperative Note    Tina EndDominique Lutz  Date of Birth:  [birth year]  [id number]    Pre-operative Diagnosis:left distal ureteral calculi/residual left renal calculi    Post-operative Diagnosis: Same    Procedure:cystopanendoscopy retrograde pyelogram /left ureteroscopy/left stone extraction/ left stent insertion on a string    Anesthesia: MAC    Surgeons/Assistants: Alysen Smylie A Yash Cacciola      Estimated Blood Loss: less than 50     Complications: None    Specimens: Was Obtained: urine for C&S/Stone  2mm  sent    Findings: only one 1 mm stone in the lower pole calyx    Electronically signed by Waynetta Peanichard A Jenille Laszlo, MD on 11/09/2016 at 10:26 AM

## 2016-11-10 MED ORDER — METHYLNALTREXONE BROMIDE 12 MG/0.6ML SC SOLN
12 MG/0.6ML | Freq: Once | SUBCUTANEOUS | Status: AC
Start: 2016-11-10 — End: 2016-11-10
  Administered 2016-11-10: 15:00:00 8 mg via SUBCUTANEOUS

## 2016-11-10 MED ORDER — SODIUM CHLORIDE 0.9 % IV BOLUS
0.9 % | Freq: Once | INTRAVENOUS | Status: AC
Start: 2016-11-10 — End: 2016-11-10
  Administered 2016-11-10: 14:00:00 500 mL via INTRAVENOUS

## 2016-11-10 MED ORDER — PREDNISONE 10 MG PO TABS
10 MG | Freq: Once | ORAL | Status: AC
Start: 2016-11-10 — End: 2016-11-10
  Administered 2016-11-10: 15:00:00 30 mg via ORAL

## 2016-11-10 MED ORDER — OXYBUTYNIN CHLORIDE ER 5 MG PO TB24
5 MG | Freq: Every evening | ORAL | Status: DC
Start: 2016-11-10 — End: 2016-11-12
  Administered 2016-11-11 – 2016-11-12 (×2): 10 mg via ORAL

## 2016-11-10 MED ORDER — HYDROCODONE-ACETAMINOPHEN 5-325 MG PO TABS
5-325 MG | Freq: Four times a day (QID) | ORAL | Status: AC
Start: 2016-11-10 — End: 2016-11-12
  Administered 2016-11-10 – 2016-11-12 (×8): 1 via ORAL

## 2016-11-10 MED FILL — NORMAL SALINE FLUSH 0.9 % IV SOLN: 0.9 % | INTRAVENOUS | Qty: 30

## 2016-11-10 MED FILL — PREDNISONE 5 MG PO TABS: 5 MG | ORAL | Qty: 2

## 2016-11-10 MED FILL — DILAUDID 1 MG/ML IJ SOLN: 1 MG/ML | INTRAMUSCULAR | Qty: 1

## 2016-11-10 MED FILL — NORMAL SALINE FLUSH 0.9 % IV SOLN: 0.9 % | INTRAVENOUS | Qty: 10

## 2016-11-10 MED FILL — MAGNESIUM OXIDE 400 (240 MG) MG PO TABS: 400 (240 Mg) MG | ORAL | Qty: 1

## 2016-11-10 MED FILL — TAMSULOSIN HCL 0.4 MG PO CAPS: 0.4 MG | ORAL | Qty: 1

## 2016-11-10 MED FILL — POTASSIUM CHLORIDE CRYS ER 20 MEQ PO TBCR: 20 MEQ | ORAL | Qty: 1

## 2016-11-10 MED FILL — BUPROPION HCL ER (XL) 150 MG PO TB24: 150 MG | ORAL | Qty: 1

## 2016-11-10 MED FILL — RELISTOR 12 MG/0.6ML SC SOLN: 12 MG/0.6ML | SUBCUTANEOUS | Qty: 0.6

## 2016-11-10 MED FILL — HYDROXYZINE HCL 25 MG PO TABS: 25 MG | ORAL | Qty: 1

## 2016-11-10 MED FILL — ONDANSETRON HCL 4 MG/2ML IJ SOLN: 4 MG/2ML | INTRAMUSCULAR | Qty: 2

## 2016-11-10 MED FILL — PROTONIX 40 MG PO TBEC: 40 MG | ORAL | Qty: 1

## 2016-11-10 MED FILL — MIRTAZAPINE 15 MG PO TABS: 15 MG | ORAL | Qty: 1

## 2016-11-10 MED FILL — MILK OF MAGNESIA 7.75 % PO SUSP: 7.75 % | ORAL | Qty: 30

## 2016-11-10 MED FILL — HYDROCODONE-ACETAMINOPHEN 5-325 MG PO TABS: 5-325 MG | ORAL | Qty: 1

## 2016-11-10 MED FILL — OXYBUTYNIN CHLORIDE ER 10 MG PO TB24: 10 MG | ORAL | Qty: 1

## 2016-11-10 MED FILL — PREDNISONE 10 MG PO TABS: 10 MG | ORAL | Qty: 3

## 2016-11-10 MED FILL — PEG 3350 17 G PO PACK: 17 g | ORAL | Qty: 1

## 2016-11-10 NOTE — Plan of Care (Signed)
Problem: Pain:  Goal: Pain level will decrease  Pain level will decrease   Outcome: Ongoing    Goal: Control of acute pain  Control of acute pain   Outcome: Ongoing      Problem: Falls - Risk of  Goal: Absence of falls  Outcome: Ongoing

## 2016-11-10 NOTE — Plan of Care (Signed)
Problem: Pain:  Goal: Pain level will decrease  Pain level will decrease   Outcome: Ongoing      Problem: Falls - Risk of  Goal: Absence of falls  Outcome: Ongoing

## 2016-11-10 NOTE — Progress Notes (Signed)
11/10/2016 8:21 AM  Service: Urology  Group: [facility] (Aniayah Alaniz/Ricchiuti/Styn)    Tina Lutz  [id number]    Chief urologic compliant: left ureteral calc  HPI:  Patient is has pain  This is from the stent  The stent is on string  This was discussed  She has not had a fever      Review of Systems:  Respiratory: negative for cough and hemoptysis  Cardiovascular: negative for chest pain and dyspnea  Gastrointestinal: negative for abdominal pain, diarrhea, nausea and vomiting  Derm: negative for rash and skin lesion(s)  Neurological: negative for seizures and tremors  Endocrine: negative for diabetic symptoms including polydipsia and polyuria  GU: As above in the HPI, otherwise negative  All other reviews are negative     Allergies: Reglan [metoclopramide]; Alpha blocker quinazolines; Aspirin; Bactrim [sulfamethoxazole-trimethoprim]; Nsaids; Pork-derived products; Sulfa antibiotics; Sulfur; Tramadol; Ciprofloxacin; and Morphine    Objective:   Vitals:    11/10/16 0745   BP: 138/78   Pulse: 88   Resp: 20   Temp: 98.4 ??F (36.9 ??C)   SpO2:        Neuro: A/A/O x3  Respiratory: non labored breathing  ABD: soft non-tender, non-distended  GU: foley no  Ext: no clubbing, cyanosis, edema    Labs:   Recent Labs      11/08/16   0359   WBC  4.4*   RBC  3.29*   HGB  9.1*   HCT  30.1*   MCV  91.5   MCH  27.7   MCHC  30.2*   RDW  13.3   PLT  201   MPV  9.9       Recent Labs      11/08/16   0359   CREATININE  0.7       Assessment: Tina EndDominique Zorn 35 y.o. female     POD 1 left laser litho for left distal ureteral calc  Left hydronephrosis  Left flank pain    Plan:    Cont the stent  This can be removed on monday  She can be discharge from out standpoint  She will need outpatient FU  Cont the pain the control    Bary CastillaMark A Sevyn Markham, DO   [facility]

## 2016-11-10 NOTE — Progress Notes (Signed)
"[facility]" Progress  Note  Current Facility-Administered Medications   Medication Dose Route Frequency Provider Last Rate Last Dose   ??? 0.9 % sodium chloride bolus  500 mL Intravenous Once Loel RoJon W Carlia Bomkamp, MD       ??? predniSONE (DELTASONE) tablet 30 mg  30 mg Oral Once Loel RoJon W Maricruz Lucero, MD       ??? HYDROcodone-acetaminophen (NORCO) 5-325 MG per tablet 1 tablet  1 tablet Oral 4x Daily Loel RoJon W Floye Fesler, MD       ??? methylnaltrexone (RELISTOR) injection 8 mg  8 mg Subcutaneous Once Loel RoJon W Donae Kueker, MD       ??? polyethylene glycol (GLYCOLAX) packet 17 g  17 g Oral Daily SpainSri Arvil Personsanjini Kanagalingam, MD   17 g at 11/08/16 1745   ??? melatonin tablet 3 mg  3 mg Oral Nightly PRN Payton MccallumSri Ranjini Kanagalingam, MD       ??? lactated ringers infusion   Intravenous Continuous Loel RoJon W Samantha Olivera, MD 125 mL/hr at 11/10/16 0818     ??? buPROPion (WELLBUTRIN XL) extended release tablet 150 mg  150 mg Oral QAM Griffin Basilenee M Rouweyha, PA   150 mg at 11/08/16 1130   ??? magnesium oxide (MAG-OX) tablet 400 mg  400 mg Oral Daily Griffin BasilRenee M Rouweyha, PA   400 mg at 11/08/16 0933   ??? mirtazapine (REMERON) tablet 15 mg  15 mg Oral Nightly Griffin Basilenee M Rouweyha, PA   15 mg at 11/09/16 2056   ??? pantoprazole (PROTONIX) tablet 40 mg  40 mg Oral QAM AC Griffin Basilenee M Rouweyha, PA   40 mg at 11/10/16 [id number]   ??? predniSONE (DELTASONE) tablet 7.5 mg  7.5 mg Oral Daily Griffin Basilenee M Rouweyha, PA   7.5 mg at 11/09/16 1026   ??? potassium chloride (KLOR-CON M) extended release tablet 20 mEq  20 mEq Oral Daily Griffin BasilRenee M Rouweyha, PA   20 mEq at 11/08/16 0932   ??? sodium chloride flush 0.9 % injection 10 mL  10 mL Intravenous 2 times per day Griffin Basilenee M Rouweyha, PA   10 mL at 11/09/16 2056   ??? sodium chloride flush 0.9 % injection 10 mL  10 mL Intravenous PRN Griffin Basilenee M Rouweyha, PA   10 mL at 11/09/16 1634   ??? acetaminophen (TYLENOL) tablet 650 mg  650 mg Oral Q4H PRN Griffin Basilenee M Rouweyha, PA       ??? magnesium hydroxide (MILK OF MAGNESIA) 400 MG/5ML suspension 30 mL  30 mL Oral Daily PRN Griffin Basilenee M Rouweyha, PA   30 mL at  11/10/16 0818   ??? ondansetron (ZOFRAN) injection 4 mg  4 mg Intravenous Q6H PRN Griffin Basilenee M Rouweyha, PA   4 mg at 11/10/16 0124   ??? HYDROmorphone (DILAUDID) injection 1 mg  1 mg Intravenous Q3H PRN Griffin Basilenee M Rouweyha, PA   1 mg at 11/10/16 0748   ??? tamsulosin (FLOMAX) capsule 0.4 mg  0.4 mg Oral Daily Mark A Memo, DO   0.4 mg at 11/08/16 0933   ??? Lidocaine-Hydrocortisone Ace 3-0.5 % CREA 1 applicator  1 applicator Rectal PRN Payton MccallumSri Ranjini Kanagalingam, MD   1 applicator at 11/07/16 2137   ??? hydrOXYzine (ATARAX) tablet 25 mg  25 mg Oral TID PRN Griffin Basilenee M Rouweyha, PA   25 mg at 11/08/16 2130     Review of Systems   Constitutional: Positive for chills.   Respiratory: Negative.    Cardiovascular: Negative.    Gastrointestinal: Positive for abdominal pain, nausea and vomiting.   Genitourinary: Positive  for dysuria, flank pain, frequency, hematuria and urgency.   Musculoskeletal: Positive for back pain.   Skin: Positive for rash.   Neurological:        Lupus arthralgias       Vitals:    11/10/16 0748   BP:    Pulse:    Resp:    Temp:    SpO2: 93%     Physical Exam   Constitutional: She appears distressed.   Cardiovascular: Normal rate and regular rhythm.    Pulmonary/Chest: Effort normal and breath sounds normal. No respiratory distress.   Abdominal: She exhibits no mass. There is tenderness. There is no guarding.   Musculoskeletal: She exhibits no edema.   Nursing note and vitals reviewed.      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

## 2016-11-11 ENCOUNTER — Inpatient Hospital Stay: Admit: 2016-11-12

## 2016-11-11 LAB — URINALYSIS WITH MICROSCOPIC
Bilirubin Urine: NEGATIVE
Glucose, Ur: NEGATIVE mg/dL
Ketones, Urine: NEGATIVE mg/dL
Nitrite, Urine: NEGATIVE
Specific Gravity, UA: 1.02 (ref 1.005–1.030)
Urobilinogen, Urine: 0.2 E.U./dL (ref <2.0)
pH, UA: 8 (ref 5.0–9.0)

## 2016-11-11 LAB — CBC
Hematocrit: 27.6 % — ABNORMAL LOW (ref 34.0–48.0)
Hemoglobin: 8.6 g/dL — ABNORMAL LOW (ref 11.5–15.5)
MCH: 28.6 pg (ref 26.0–35.0)
MCHC: 31.2 % — ABNORMAL LOW (ref 32.0–34.5)
MCV: 91.7 fL (ref 80.0–99.9)
MPV: 9.6 fL (ref 7.0–12.0)
Platelets: 253 E9/L (ref 130–450)
RBC: 3.01 E12/L — ABNORMAL LOW (ref 3.50–5.50)
RDW: 13.2 fL (ref 11.5–15.0)
WBC: 4.2 E9/L — ABNORMAL LOW (ref 4.5–11.5)

## 2016-11-11 LAB — COMPREHENSIVE METABOLIC PANEL
ALT: 11 U/L (ref 0–32)
AST: 14 U/L (ref 0–31)
Albumin: 3 g/dL — ABNORMAL LOW (ref 3.5–5.2)
Alkaline Phosphatase: 59 U/L (ref 35–104)
Anion Gap: 10 mmol/L (ref 7–16)
BUN: 7 mg/dL (ref 6–20)
CO2: 30 mmol/L — ABNORMAL HIGH (ref 22–29)
Calcium: 9.1 mg/dL (ref 8.6–10.2)
Chloride: 101 mmol/L (ref 98–107)
Creatinine: 0.6 mg/dL (ref 0.5–1.0)
GFR African American: >60
GFR Non-African American: >60 mL/min/{1.73_m2} (ref >=60)
Glucose: 111 mg/dL — ABNORMAL HIGH (ref 74–109)
Potassium: 3.5 mmol/L (ref 3.5–5.0)
Sodium: 141 mmol/L (ref 132–146)
Total Bilirubin: <0.2 mg/dL (ref 0.0–1.2)
Total Protein: 6.9 g/dL (ref 6.4–8.3)

## 2016-11-11 LAB — CULTURE, URINE

## 2016-11-11 MED FILL — DILAUDID 1 MG/ML IJ SOLN: 1 MG/ML | INTRAMUSCULAR | Qty: 1

## 2016-11-11 MED FILL — NORMAL SALINE FLUSH 0.9 % IV SOLN: 0.9 % | INTRAVENOUS | Qty: 20

## 2016-11-11 MED FILL — HYDROCODONE-ACETAMINOPHEN 5-325 MG PO TABS: 5-325 MG | ORAL | Qty: 1

## 2016-11-11 MED FILL — OXYBUTYNIN CHLORIDE ER 10 MG PO TB24: 10 MG | ORAL | Qty: 1

## 2016-11-11 MED FILL — PROTONIX 40 MG PO TBEC: 40 MG | ORAL | Qty: 1

## 2016-11-11 MED FILL — BUPROPION HCL ER (XL) 150 MG PO TB24: 150 MG | ORAL | Qty: 1

## 2016-11-11 MED FILL — PREDNISONE 5 MG PO TABS: 5 MG | ORAL | Qty: 2

## 2016-11-11 MED FILL — HYDROXYZINE HCL 25 MG PO TABS: 25 MG | ORAL | Qty: 1

## 2016-11-11 MED FILL — MIRTAZAPINE 15 MG PO TABS: 15 MG | ORAL | Qty: 1

## 2016-11-11 MED FILL — MAGNESIUM OXIDE 400 (240 MG) MG PO TABS: 400 (240 Mg) MG | ORAL | Qty: 1

## 2016-11-11 MED FILL — ONDANSETRON HCL 4 MG/2ML IJ SOLN: 4 MG/2ML | INTRAMUSCULAR | Qty: 2

## 2016-11-11 MED FILL — PEG 3350 17 G PO PACK: 17 g | ORAL | Qty: 1

## 2016-11-11 MED FILL — POTASSIUM CHLORIDE CRYS ER 20 MEQ PO TBCR: 20 MEQ | ORAL | Qty: 1

## 2016-11-11 MED FILL — TAMSULOSIN HCL 0.4 MG PO CAPS: 0.4 MG | ORAL | Qty: 1

## 2016-11-11 NOTE — Plan of Care (Signed)
Problem: Pain:  Goal: Pain level will decrease  Pain level will decrease   Outcome: Ongoing      Problem: Falls - Risk of  Goal: Absence of falls  Outcome: Ongoing

## 2016-11-11 NOTE — Progress Notes (Signed)
Patient bladder scanned for 227 cc. M.Jeneva Schweizer, RN

## 2016-11-11 NOTE — Progress Notes (Signed)
[facility] Hospitalist Note      Current Facility-Administered Medications   Medication Dose Route Frequency Provider Last Rate Last Dose   ??? HYDROcodone-acetaminophen (NORCO) 5-325 MG per tablet 1 tablet  1 tablet Oral 4x Daily Loel RoJon W Arnott, MD   1 tablet at 11/11/16 1258   ??? oxybutynin (DITROPAN-XL) extended release tablet 10 mg  10 mg Oral Nightly Mark A Memo, DO   10 mg at 11/10/16 2048   ??? polyethylene glycol (GLYCOLAX) packet 17 g  17 g Oral Daily Payton MccallumSri Ranjini Kanagalingam, MD   17 g at 11/11/16 [id number]   ??? melatonin tablet 3 mg  3 mg Oral Nightly PRN Payton MccallumSri Ranjini Kanagalingam, MD       ??? lactated ringers infusion   Intravenous Continuous Loel RoJon W Arnott, MD 200 mL/hr at 11/11/16 0814     ??? buPROPion (WELLBUTRIN XL) extended release tablet 150 mg  150 mg Oral QAM Griffin Basilenee M Rouweyha, PA   150 mg at 11/11/16 0843   ??? magnesium oxide (MAG-OX) tablet 400 mg  400 mg Oral Daily Griffin BasilRenee M Rouweyha, PA   400 mg at 11/11/16 0843   ??? mirtazapine (REMERON) tablet 15 mg  15 mg Oral Nightly Griffin Basilenee M Rouweyha, PA   15 mg at 11/10/16 2048   ??? pantoprazole (PROTONIX) tablet 40 mg  40 mg Oral QAM AC Griffin Basilenee M Rouweyha, PA   40 mg at 11/11/16 [id number]   ??? predniSONE (DELTASONE) tablet 7.5 mg  7.5 mg Oral Daily Griffin BasilRenee M Rouweyha, PA   7.5 mg at 11/11/16 0843   ??? potassium chloride (KLOR-CON M) extended release tablet 20 mEq  20 mEq Oral Daily Griffin BasilRenee M Rouweyha, PA   20 mEq at 11/11/16 [id number]   ??? sodium chloride flush 0.9 % injection 10 mL  10 mL Intravenous 2 times per day Griffin Basilenee M Rouweyha, PA   10 mL at 11/09/16 2056   ??? sodium chloride flush 0.9 % injection 10 mL  10 mL Intravenous PRN Griffin Basilenee M Rouweyha, PA   10 mL at 11/10/16 1133   ??? acetaminophen (TYLENOL) tablet 650 mg  650 mg Oral Q4H PRN Griffin Basilenee M Rouweyha, PA       ??? magnesium hydroxide (MILK OF MAGNESIA) 400 MG/5ML suspension 30 mL  30 mL Oral Daily PRN Griffin Basilenee M Rouweyha, PA   30 mL at 11/10/16 0818   ??? ondansetron (ZOFRAN) injection 4 mg  4 mg Intravenous Q6H PRN Griffin Basilenee M  Rouweyha, PA   4 mg at 11/11/16 0814   ??? HYDROmorphone (DILAUDID) injection 1 mg  1 mg Intravenous Q3H PRN Griffin Basilenee M Rouweyha, PA   1 mg at 11/11/16 1138   ??? tamsulosin (FLOMAX) capsule 0.4 mg  0.4 mg Oral Daily Mark A Memo, DO   0.4 mg at 11/11/16 0842   ??? Lidocaine-Hydrocortisone Ace 3-0.5 % CREA 1 applicator  1 applicator Rectal PRN Payton MccallumSri Ranjini Kanagalingam, MD   1 applicator at 11/07/16 2137   ??? hydrOXYzine (ATARAX) tablet 25 mg  25 mg Oral TID PRN Griffin Basilenee M Rouweyha, PA   25 mg at 11/08/16 2130     Review of Systems   Constitutional: Positive for chills.   Respiratory: Positive for wheezing. Negative for cough and shortness of breath.    Cardiovascular: Negative for chest pain.   Gastrointestinal: Positive for nausea and vomiting. Negative for abdominal pain.   Genitourinary: Positive for frequency.       Vitals:    11/11/16 1054   BP:  118/72   Pulse: 78   Resp: 16   Temp: 98.4 ??F (36.9 ??C)   SpO2:      Physical Exam   Constitutional: She is oriented to person, place, and time.   Cardiovascular: Normal rate and regular rhythm.    Pulmonary/Chest: She has decreased breath sounds.   Abdominal: Bowel sounds are normal. She exhibits distension. There is tenderness in the right lower quadrant and left lower quadrant.   Genitourinary:   Genitourinary Comments: Stent   Neurological: She is alert and oriented to person, place, and time.   Skin: Skin is warm and dry.       Recent Labs      11/11/16   1014   NA  141   K  3.5   CL  101   CO2  30*   BUN  7   CREATININE  0.6   GLUCOSE  111*   CALCIUM  9.1     Recent Labs      11/11/16   1014   WBC  4.2*   RBC  3.01*   HGB  8.6*   HCT  27.6*   MCV  91.7   MCH  28.6   MCHC  31.2*   RDW  13.2   PLT  253   MPV  9.6     Assessment:    Principal Problem:    Kidney stone  Active Problems:    Lupus (systemic lupus erythematosus) (HCC)    Uncontrolled pain    Adrenal insufficiency (HCC)      Plan:  Continue IV fluids  Bladder scan  Incentive spirometer  UA  C &S  Continue  steroids      Dr. Girard CooterArnott physically present and examined patient.   Electronically signed by Vilinda BoehringerMichelle Armida Vickroy, CNP on 11/11/2016 at 1:14 PM

## 2016-11-11 NOTE — Plan of Care (Signed)
Problem: Pain:  Goal: Pain level will decrease  Pain level will decrease   Outcome: Ongoing    Goal: Control of acute pain  Control of acute pain   Outcome: Ongoing      Problem: Falls - Risk of  Goal: Absence of falls  Outcome: Ongoing

## 2016-11-11 NOTE — Plan of Care (Signed)
Problem: Falls - Risk of  Goal: Absence of falls  Outcome: Ongoing

## 2016-11-12 LAB — BASIC METABOLIC PANEL
Anion Gap: 10 mmol/L (ref 7–16)
BUN: 5 mg/dL — ABNORMAL LOW (ref 6–20)
CO2: 30 mmol/L — ABNORMAL HIGH (ref 22–29)
Calcium: 8.6 mg/dL (ref 8.6–10.2)
Chloride: 102 mmol/L (ref 98–107)
Creatinine: 0.6 mg/dL (ref 0.5–1.0)
GFR African American: >60
GFR Non-African American: >60 mL/min/{1.73_m2} (ref >=60)
Glucose: 76 mg/dL (ref 74–109)
Potassium: 3.7 mmol/L (ref 3.5–5.0)
Sodium: 142 mmol/L (ref 132–146)

## 2016-11-12 LAB — CBC
Hematocrit: 28.8 % — ABNORMAL LOW (ref 34.0–48.0)
Hemoglobin: 8.8 g/dL — ABNORMAL LOW (ref 11.5–15.5)
MCH: 27.7 pg (ref 26.0–35.0)
MCHC: 30.6 % — ABNORMAL LOW (ref 32.0–34.5)
MCV: 90.6 fL (ref 80.0–99.9)
MPV: 9.7 fL (ref 7.0–12.0)
Platelets: 281 E9/L (ref 130–450)
RBC: 3.18 E12/L — ABNORMAL LOW (ref 3.50–5.50)
RDW: 13.2 fL (ref 11.5–15.0)
WBC: 5.4 E9/L (ref 4.5–11.5)

## 2016-11-12 MED ORDER — HYDROMORPHONE HCL 2 MG/ML IJ SOLN
2 MG/ML | INTRAMUSCULAR | Status: DC | PRN
Start: 2016-11-12 — End: 2016-11-12
  Administered 2016-11-12 (×2): 1 mg via INTRAVENOUS

## 2016-11-12 MED ORDER — HYDROMORPHONE HCL 1 MG/ML IJ SOLN
1 MG/ML | INTRAMUSCULAR | Status: DC | PRN
Start: 2016-11-12 — End: 2016-11-13
  Administered 2016-11-13: 0.25 mg via INTRAVENOUS

## 2016-11-12 MED ORDER — HYDROCODONE-ACETAMINOPHEN 5-325 MG PO TABS
5-325 MG | ORAL | Status: DC | PRN
Start: 2016-11-12 — End: 2016-11-13
  Administered 2016-11-12 – 2016-11-13 (×4): 1 via ORAL

## 2016-11-12 MED ORDER — PROMETHAZINE HCL 25 MG/ML IJ SOLN
25 MG/ML | Freq: Four times a day (QID) | INTRAMUSCULAR | Status: DC | PRN
Start: 2016-11-12 — End: 2016-11-13
  Administered 2016-11-12 – 2016-11-13 (×2): 25 mg via INTRAMUSCULAR

## 2016-11-12 MED ORDER — ALUM & MAG HYDROXIDE-SIMETH 200-200-20 MG/5ML PO SUSP
200-200-20 MG/5ML | Freq: Four times a day (QID) | ORAL | Status: DC | PRN
Start: 2016-11-12 — End: 2016-11-13
  Administered 2016-11-12: 04:00:00 30 mL via ORAL

## 2016-11-12 MED FILL — ONDANSETRON HCL 4 MG/2ML IJ SOLN: 4 MG/2ML | INTRAMUSCULAR | Qty: 2

## 2016-11-12 MED FILL — TAMSULOSIN HCL 0.4 MG PO CAPS: 0.4 MG | ORAL | Qty: 1

## 2016-11-12 MED FILL — POTASSIUM CHLORIDE CRYS ER 20 MEQ PO TBCR: 20 MEQ | ORAL | Qty: 1

## 2016-11-12 MED FILL — HYDROCODONE-ACETAMINOPHEN 5-325 MG PO TABS: 5-325 MG | ORAL | Qty: 1

## 2016-11-12 MED FILL — HYDROXYZINE HCL 25 MG PO TABS: 25 MG | ORAL | Qty: 1

## 2016-11-12 MED FILL — DILAUDID 1 MG/ML IJ SOLN: 1 MG/ML | INTRAMUSCULAR | Qty: 1

## 2016-11-12 MED FILL — BUPROPION HCL ER (XL) 150 MG PO TB24: 150 MG | ORAL | Qty: 1

## 2016-11-12 MED FILL — PROTONIX 40 MG PO TBEC: 40 MG | ORAL | Qty: 1

## 2016-11-12 MED FILL — HYDROMORPHONE HCL 2 MG/ML IJ SOLN: 2 MG/ML | INTRAMUSCULAR | Qty: 1

## 2016-11-12 MED FILL — PHENERGAN 25 MG/ML IJ SOLN: 25 MG/ML | INTRAMUSCULAR | Qty: 1

## 2016-11-12 MED FILL — NORMAL SALINE FLUSH 0.9 % IV SOLN: 0.9 % | INTRAVENOUS | Qty: 10

## 2016-11-12 MED FILL — MAGNESIUM OXIDE 400 (240 MG) MG PO TABS: 400 (240 Mg) MG | ORAL | Qty: 1

## 2016-11-12 MED FILL — MILK OF MAGNESIA 7.75 % PO SUSP: 7.75 % | ORAL | Qty: 30

## 2016-11-12 MED FILL — NORMAL SALINE FLUSH 0.9 % IV SOLN: 0.9 % | INTRAVENOUS | Qty: 20

## 2016-11-12 MED FILL — OXYBUTYNIN CHLORIDE ER 10 MG PO TB24: 10 MG | ORAL | Qty: 1

## 2016-11-12 MED FILL — PREDNISONE 5 MG PO TABS: 5 MG | ORAL | Qty: 2

## 2016-11-12 MED FILL — PEG 3350 17 G PO PACK: 17 g | ORAL | Qty: 1

## 2016-11-12 MED FILL — MAG-AL PLUS 200-200-20 MG/5ML PO LIQD: 200-200-20 MG/5ML | ORAL | Qty: 30

## 2016-11-12 NOTE — Progress Notes (Signed)
[facility] Hospitalist Progress Note    Subjective:    Patient complains of lower abdominal pain and nause.  She said she csn only eat small amounts of food at a time.  She reports three medium sized bowel movements since last night.       ROS: denies fever, chills, cp, sob, n/v, HA unless stated above.    ??? polyethylene glycol  17 g Oral Daily   ??? buPROPion  150 mg Oral QAM   ??? magnesium oxide  400 mg Oral Daily   ??? pantoprazole  40 mg Oral QAM AC   ??? predniSONE  7.5 mg Oral Daily   ??? potassium chloride  20 mEq Oral Daily   ??? sodium chloride flush  10 mL Intravenous 2 times per day   ??? tamsulosin  0.4 mg Oral Daily       promethazine 25 mg Q6H PRN   HYDROmorphone 1 mg Q3H PRN   aluminum & magnesium hydroxide-simethicone 30 mL Q6H PRN   melatonin 3 mg Nightly PRN   sodium chloride flush 10 mL PRN   acetaminophen 650 mg Q4H PRN   magnesium hydroxide 30 mL Daily PRN   ondansetron 4 mg Q6H PRN   Lidocaine-Hydrocortisone Ace 1 applicator PRN   hydrOXYzine 25 mg TID PRN        Objective:    BP 112/69    Pulse 62    Temp 98.8 ??F (37.1 ??C)    Resp 16    Ht 5\' 5"  (1.651 m)    Wt 142 lb (64.4 kg)    LMP  (LMP Unknown)    SpO2 99%    BMI 23.63 kg/m??   General Appearance: alert and oriented to person, place and time  Skin: warm and dry, no rash or erythema  Pulmonary/Chest: clear to auscultation bilaterally- no wheezes, rales or rhonchi, normal air movement, no respiratory distress  Cardiovascular: normal rate, normal S1 and S2, no gallops, intact distal pulses and no carotid bruits  Abdomen: bowel sounds normal, tenderness present- lowe quadrants,  with  guarding and distention present.  Extremities: no cyanosis and no clubbing  Neurologic: speech normal      Recent Labs      11/11/16   1014  11/12/16   0420   NA  141  142   K  3.5  3.7   CL  101  102   CO2  30*  30*   BUN  7  5*   CREATININE  0.6  0.6   GLUCOSE  111*  76   CALCIUM  9.1  8.6       Recent Labs      11/11/16   1014  11/12/16   0420   WBC  4.2*  5.4   RBC  3.01*  3.18*    HGB  8.6*  8.8*   HCT  27.6*  28.8*   MCV  91.7  90.6   MCH  28.6  27.7   MCHC  31.2*  30.6*   RDW  13.2  13.2   PLT  253  281   MPV  9.6  9.7         Assessment:    Principal Problem:    Kidney stone  Active Problems:    Lupus (systemic lupus erythematosus) (HCC)    Uncontrolled pain    Adrenal insufficiency (HCC)      Plan:  1. Kidney stone:  S/p cystopanendoscopy, retrograde pyelogram, left ureteroscopy, stone extraction, and  stent insertion on 11/09/16.  Stent removed by Urology today. Patient still reporting nausea.  Adjust nausea medication. Continue IV fluid.  Monitor urine output.  2. Lupus: Patient received 30 mg of Prednisone on 11/10/16.  Prednisone 7.5 mg oral daily.   3.   Uncontrolled pain:  Pain medication adjusted by urology.  Monitor for pain                   control. Readjust pain medication as needed.  4.   Discharge likely tomorrow.      Electronically signed by Vilinda BoehringerMichelle Ahijah Devery, CNP on 11/12/2016 at 12:58 PM

## 2016-11-12 NOTE — Plan of Care (Signed)
Problem: Pain:  Goal: Control of acute pain  Control of acute pain   Outcome: Met This Shift

## 2016-11-12 NOTE — Plan of Care (Cosign Needed)
Problem: Pain:  Goal: Pain level will decrease  Pain level will decrease   Outcome: Met This Shift    Goal: Control of acute pain  Control of acute pain   Outcome: Met This Shift    Goal: Control of chronic pain  Control of chronic pain   Outcome: Met This Shift      Problem: Falls - Risk of  Goal: Absence of falls  Outcome: Met This Shift      Problem: Discharge Planning:  Goal: Discharged to appropriate level of care  Discharged to appropriate level of care   Outcome: Met This Shift

## 2016-11-12 NOTE — Plan of Care (Signed)
Problem: Pain:  Goal: Pain level will decrease  Pain level will decrease   Outcome: Ongoing      Problem: Falls - Risk of  Goal: Absence of falls  Outcome: Ongoing      Problem: Discharge Planning:  Goal: Discharged to appropriate level of care  Discharged to appropriate level of care  Outcome: Ongoing

## 2016-11-12 NOTE — Progress Notes (Signed)
[facility]                                                           PROGRESS NOTE                                                                       11/12/2016          SUBJECTIVE:    Afebrile  Pt having stent discomfort    OBJECTIVE:    BP 112/69    Pulse 62    Temp 98.8 ??F (37.1 ??C)    Resp 16    Ht 5\' 5"  (1.651 m)    Wt 142 lb (64.4 kg)    LMP  (LMP Unknown)    SpO2 99%    BMI 23.63 kg/m??     PHYSICAL EXAMINATION:  Skin: dry, without rashes  Respirations: non-labored, intact  Abdomen: soft, non-tender, non-distended      Lab Results   Component Value Date    WBC 5.4 11/12/2016    HGB 8.8 (L) 11/12/2016    HCT 28.8 (L) 11/12/2016    MCV 90.6 11/12/2016    PLT 281 11/12/2016       Lab Results   Component Value Date    CREATININE 0.6 11/12/2016       No results found for: PSA    REVIEW OF SYSTEMS:    CONSTITUTIONAL: negative  HEENT: negative  HEMATOLOGIC: negative  ENDOCRINE: negative  RESPIRATORY: negative  CV: negative  GI: negative  NEURO: negative  ORTHOPEDICS: negative  PSYCHIATRIC: negative  GU: as above    PAST FAMILY HISTORY:  History reviewed. No pertinent family history.  PAST SOCIAL HISTORY:    Social History     Social History   ??? Marital status: Single     Spouse name: N/A   ??? Number of children: N/A   ??? Years of education: N/A     Social History Main Topics   ??? Smoking status: Current Every Day Smoker     Packs/day: 0.50   ??? Smokeless tobacco: Never Used   ??? Alcohol use No   ??? Drug use:      Types: Marijuana      Comment: daily   ??? Sexual activity: Not Asked     Other Topics Concern   ??? None     Social History Narrative   ??? None       Scheduled Meds:  ??? polyethylene glycol  17 g Oral Daily   ??? buPROPion  150 mg Oral QAM   ??? magnesium oxide  400 mg Oral Daily   ??? pantoprazole  40 mg Oral QAM AC   ??? predniSONE  7.5 mg Oral Daily   ??? potassium chloride  20 mEq Oral Daily   ??? sodium chloride flush  10 mL Intravenous 2 times per day   ???  tamsulosin  0.4 mg Oral Daily     Continuous Infusions:   PRN Meds:.promethazine, aluminum & magnesium hydroxide-simethicone, melatonin, sodium chloride flush, acetaminophen, magnesium hydroxide,  ondansetron, HYDROmorphone, Lidocaine-Hydrocortisone Ace, hydrOXYzine    ASSESSMENT:    Patient Active Problem List   Diagnosis   ??? Lupus (systemic lupus erythematosus) (HCC)   ??? Uncontrolled pain   ??? Proteinuria   ??? Kidney stone   ??? Adrenal insufficiency (HCC)       PLAN:  Stent removed  Pt can be discharged      Jacki Conesobert R Dezarai Prew, M.D.  11/12/2016  11:56 AM

## 2016-11-12 NOTE — Progress Notes (Signed)
[facility] Quality Flow/Interdisciplinary Rounds Progress Note        Quality Flow Rounds held on November 12, 2016    Disciplines Attending:  Bedside Nurse, Social Worker, Case Manager and Nursing Unit Leadership    Tina Lutz was admitted on 11/07/2016 10:55 AM    Anticipated Discharge Date:  Expected Discharge Date: 11/09/16    Disposition:    Braden Score:  Braden Scale Score: 20    Readmission Risk              Readmission Risk:        22       Age 35 or Greater:  0    Admitted from SNF or Requires Paid or Family Care:  0    Currently has CHF,COPD,ARF,CRI,or is on dialysis:  0    Takes more than 5 Prescription Medications:  4    Takes Digoxin,Insulin,Anticoagulants,Narcotics or ASA/Plavix:  0    Hospital Admit in Past 12 Months:  10    On Disability:  3    Patient Considers own Health:  5          Discussed patient goal for the day, patient clinical progression, and barriers to discharge.  The following Goal(s) of the Day/Commitment(s) have been identified:  Monitor N/V      Tina Lutz  November 12, 2016

## 2016-11-13 LAB — BASIC METABOLIC PANEL
Anion Gap: 9 mmol/L (ref 7–16)
BUN: 12 mg/dL (ref 6–20)
CO2: 32 mmol/L — ABNORMAL HIGH (ref 22–29)
Calcium: 8.6 mg/dL (ref 8.6–10.2)
Chloride: 103 mmol/L (ref 98–107)
Creatinine: 0.9 mg/dL (ref 0.5–1.0)
GFR African American: >60
GFR Non-African American: >60 mL/min/{1.73_m2} (ref >=60)
Glucose: 93 mg/dL (ref 74–109)
Potassium: 4 mmol/L (ref 3.5–5.0)
Sodium: 144 mmol/L (ref 132–146)

## 2016-11-13 LAB — CBC
Hematocrit: 28.6 % — ABNORMAL LOW (ref 34.0–48.0)
Hemoglobin: 8.6 g/dL — ABNORMAL LOW (ref 11.5–15.5)
MCH: 27.7 pg (ref 26.0–35.0)
MCHC: 30.1 % — ABNORMAL LOW (ref 32.0–34.5)
MCV: 92.3 fL (ref 80.0–99.9)
MPV: 9.6 fL (ref 7.0–12.0)
Platelets: 269 E9/L (ref 130–450)
RBC: 3.1 E12/L — ABNORMAL LOW (ref 3.50–5.50)
RDW: 13.4 fL (ref 11.5–15.0)
WBC: 4.9 E9/L (ref 4.5–11.5)

## 2016-11-13 LAB — CULTURE, URINE: Urine Culture, Routine: <25000

## 2016-11-13 MED ORDER — PROMETHAZINE HCL 25 MG PO TABS
25 MG | ORAL_TABLET | Freq: Four times a day (QID) | ORAL | 0 refills | Status: AC | PRN
Start: 2016-11-13 — End: 2016-11-20

## 2016-11-13 MED ORDER — CEFDINIR 300 MG PO CAPS
300 | ORAL_CAPSULE | Freq: Two times a day (BID) | ORAL | 0 refills | Status: AC
Start: 2016-11-13 — End: 2016-11-20

## 2016-11-13 MED ORDER — HYDROCODONE-ACETAMINOPHEN 5-325 MG PO TABS
5-325 MG | ORAL_TABLET | ORAL | 0 refills | Status: AC | PRN
Start: 2016-11-13 — End: 2016-11-20

## 2016-11-13 MED FILL — HYDROCODONE-ACETAMINOPHEN 5-325 MG PO TABS: 5-325 MG | ORAL | Qty: 1

## 2016-11-13 MED FILL — PROTONIX 40 MG PO TBEC: 40 MG | ORAL | Qty: 1

## 2016-11-13 MED FILL — PEG 3350 17 G PO PACK: 17 g | ORAL | Qty: 1

## 2016-11-13 MED FILL — NORMAL SALINE FLUSH 0.9 % IV SOLN: 0.9 % | INTRAVENOUS | Qty: 10

## 2016-11-13 MED FILL — HYDROXYZINE HCL 25 MG PO TABS: 25 MG | ORAL | Qty: 1

## 2016-11-13 MED FILL — PHENERGAN 25 MG/ML IJ SOLN: 25 MG/ML | INTRAMUSCULAR | Qty: 1

## 2016-11-13 MED FILL — ONDANSETRON HCL 4 MG/2ML IJ SOLN: 4 MG/2ML | INTRAMUSCULAR | Qty: 2

## 2016-11-13 MED FILL — BUPROPION HCL ER (XL) 150 MG PO TB24: 150 MG | ORAL | Qty: 1

## 2016-11-13 NOTE — Discharge Instructions (Addendum)
Follow-up With  Details  Why  Contact Info   Mark A Memo, DO  Schedule an appointment as soon as possible for a visit in 1 week  Follow up after hospital admission, Call symptoms worsen  [address]  [place] [id number]  [phone]   [facility]  Schedule an appointment as soon as possible for a visit in 3 days  Follow up after hospital admission  [facility] to get patient set up with appointment   Resume bathing/driving tomorrow.   No diet restrictions   Make appt to  See  Dr Gerlene Burdockichard Memo in office one  month [phone]   You may have back pain with urination.   You may see blood in urine and burning with urination/call if severe back pain or weakness or fever  Take pain pill one hour before stent removal    Activity level: as tolerated    Diet: regular diet    Labs:none    Dispo:home    Your information:  Name: Tina Lutz  DOB: [birth year]   Constipation: Care Instructions  Your Care Instructions  Constipation means that you have a hard time passing stools (bowel movements). People pass stools from 3 times a day to once every 3 days. What is normal for you may be different. Constipation may occur with pain in the rectum and cramping. The pain may get worse when you try to pass stools. Sometimes there are small amounts of bright red blood on toilet paper or the surface of stools. This is because of enlarged veins near the rectum (hemorrhoids).  A few changes in your diet and lifestyle may help you avoid ongoing constipation. Your doctor may also prescribe medicine to help loosen your stool.  Some medicines can cause constipation. These include pain medicines and antidepressants. Tell your doctor about all the medicines you take. Your doctor may want to make a medicine change to ease your symptoms.  Follow-up care is a key part of your treatment and safety. Be sure to make and go to all appointments, and call your doctor if you are having problems. It's also a good idea to  know your test results and keep a list of the medicines you take.  How can you care for yourself at home?   Drink plenty of fluids, enough so that your urine is light yellow or clear like water. If you have kidney, heart, or liver disease and have to limit fluids, talk with your doctor before you increase the amount of fluids you drink.   Include high-fiber foods in your diet each day. These include fruits, vegetables, beans, and whole grains.   Get at least 30 minutes of exercise on most days of the week. Walking is a good choice. You also may want to do other activities, such as running, swimming, cycling, or playing tennis or team sports.   Take a fiber supplement, such as Citrucel or Metamucil, every day. Read and follow all instructions on the label.   Schedule time each day for a bowel movement. A daily routine may help. Take your time having your bowel movement.   Support your feet with a small step stool when you sit on the toilet. This helps flex your hips and places your pelvis in a squatting position.   Your doctor may recommend an over-the-counter laxative to relieve your constipation. Examples are Milk of Magnesia and MiraLax. Read and follow all instructions on the label. Do not use laxatives on a long-term basis.  When should you call for help?  Call your doctor now or seek immediate medical care if:   You have new or worse belly pain.   You have new or worse nausea or vomiting.   You have blood in your stools.  Watch closely for changes in your health, and be sure to contact your doctor if:   Your constipation is getting worse.   You do not get better as expected.  Where can you learn more?  Go to [email or web address] and sign in to your MyChart account. Enter P343 in the Search Health Information box to learn more about "Constipation: Care Instructions."     If you do not have an account, please click on the "Sign Up Now" link.  Current as of: March 19, 2016  Content  Version: 11.3   2006-2017 [employer]. Care instructions adapted under license by [facility]. If you have questions about a medical condition or this instruction, always ask your healthcare professional. [employer], Inc[employer] disclaims any warranty or liability for your use of this information.   Kidney Stone: Care Instructions  Your Care Instructions    Kidney stones are formed when salts, minerals, and other substances normally found in the urine clump together. They can be as small as grains of sand or, rarely, as large as golf balls.  While the stone is traveling through the ureter, which is the tube that carries urine from the kidney to the bladder, you will probably feel pain. The pain may be mild or very severe. You may also have some blood in your urine. As soon as the stone reaches the bladder, any intense pain should go away.  If a stone is too large to pass on its own, you may need a medical procedure to help you pass the stone.  The doctor has checked you carefully, but problems can develop later. If you notice any problems or new symptoms, get medical treatment right away.  Follow-up care is a key part of your treatment and safety. Be sure to make and go to all appointments, and call your doctor if you are having problems. It's also a good idea to know your test results and keep a list of the medicines you take.  How can you care for yourself at home?   Drink plenty of fluids, enough so that your urine is light yellow or clear like water. If you have kidney, heart, or liver disease and have to limit fluids, talk with your doctor before you increase the amount of fluids you drink.   Take pain medicines exactly as directed. Call your doctor if you think you are having a problem with your medicine.   If the doctor gave you a prescription medicine for pain, take it as prescribed.   If you are not taking a prescription pain medicine, ask your doctor if you can take an over-the-counter  medicine. Read and follow all instructions on the label.   Your doctor may ask you to strain your urine so that you can collect your kidney stone when it passes. You can use a kitchen strainer or a tea strainer to catch the stone. Store it in a plastic bag until you see your doctor again.  Preventing future kidney stones  Some changes in your diet may help prevent kidney stones. Depending on the cause of your stones, your doctor may recommend that you:   Drink plenty of fluids, enough so that your urine is light yellow or clear like water. If  you have kidney, heart, or liver disease and have to limit fluids, talk with your doctor before you increase the amount of fluids you drink.   Limit coffee, tea, and alcohol. Also avoid grapefruit juice.   Do not take more than the recommended daily dose of vitamins C and D.   Avoid antacids such as Gaviscon, Maalox, Mylanta, or Tums.   Limit the amount of salt (sodium) in your diet.   Eat a balanced diet that is not too high in protein.   Limit foods that are high in a substance called oxalate, which can cause kidney stones. These foods include dark green vegetables, rhubarb, chocolate, wheat bran, nuts, cranberries, and beans.  When should you call for help?  Call your doctor now or seek immediate medical care if:   You cannot keep down fluids.   Your pain gets worse.   You have a fever or chills.   You have new or worse pain in your back just below your rib cage (the flank area).   You have new or more blood in your urine.  Watch closely for changes in your health, and be sure to contact your doctor if:   You do not get better as expected.  Where can you learn more?  Go to [email or web address] and sign in to your MyChart account. Enter [phone] in the Search Health Information box to learn more about "Kidney Stone: Care Instructions."     If you do not have an account, please click on the "Sign Up Now" link.  Current as of: April 02, 2016  Content  Version: 11.3   2006-2017 [employer]. Care instructions adapted under license by [facility]. If you have questions about a medical condition or this instruction, always ask your healthcare professional. [employer], [employer] disclaims any warranty or liability for your use of this information.    Your instructions:    The following personal items were collected during your admission and were returned to you:    Valuables  Dentures: None  Vision - Corrective Lenses: None  Hearing Aid: None  Jewelry: None  Body Piercings Removed: N/A  Clothing: Pants, Shirt, Footwear, Undergarments (Comment), Jacket / coat  Were All Patient Medications Collected?: Not Applicable  Other Valuables: Cell phone, Wallet, Money (Comment)  Valuables Given To: Patient    Information obtained by:  By signing below, I understand that if any problems occur once I leave the hospital I am to contact your pcp.  I understand and acknowledge receipt of the instructions indicated above.

## 2016-11-13 NOTE — Discharge Summary (Signed)
[facility] Hospitalist Physician Discharge Summary       Follow-up With  Details  Why  Contact Info   Mark A Memo, DO  Schedule an appointment as soon as possible for a visit in 1 week  Follow up after hospital admission, Call symptoms worsen  [address]  [place] [address]  [phone]   [facility]  Schedule an appointment as soon as possible for a visit in 3 days  Follow up after hospital admission  [facility] access center to get patient set up with appointment       Activity level: as tolerated    Diet: regular diet    Labs:none    Dispo:home    Patient ID:  Tina Lutz  [id number]  35 y.o.  16-Jan-1981    Admit date: 11/07/2016    Discharge date and time:  11/13/2016  8:57 AM    Admission Diagnoses: Principal Problem:    Kidney stone  Active Problems:    Lupus (systemic lupus erythematosus) (HCC)    Uncontrolled pain    Adrenal insufficiency (HCC)      Discharge Diagnoses: Principal Problem:    Kidney stone  Active Problems:    Lupus (systemic lupus erythematosus) (HCC)    Uncontrolled pain    Adrenal insufficiency (HCC)      Consults:  IP CONSULT TO UROLOGY    Procedures: cystopanendoscopy, retrograde pyelogram, left ureteroscopy, stone extraction, and stent insertion on 11/09/16.  Stent removed     Hospital Course: Pam Vanalstine??is a 35 y.o.??female with PMH of lupus on prednisone and Avascular necrosis of the bilat femur. She is  status post right hip replacement and awaiting left hip replacement in future. She presented with lower abdominal pain with discomfort urinating and increased urinary frequency. With this he has associated nausea, vomiting and body aches.She was found to have left ureterovesical junction stone, 3 mm with left moderate hydronephrosis. She was seen by urology and had cystopanendoscopy, retrograde pyelogram, left ureteroscopy, stone extraction, and stent insertion. The stent was removed yesterday. Urine grew enterobacter but UA did not show UTI. She will be discharged  home on medications below and will need to establish PCP and follow up with urology.     Discharge Exam:  Vitals:    11/12/16 1525 11/13/16 0005 11/13/16 0725 11/13/16 0738   BP: 116/72 103/62  124/68   Pulse: 91 87  80   Resp: 18 16  18    Temp: 98.1 ??F (36.7 ??C) 98.1 ??F (36.7 ??C)  98.4 ??F (36.9 ??C)   TempSrc: Oral Oral  Oral   SpO2:  98%     Weight:   150 lb (68 kg)    Height:         General Appearance: alert and oriented to person, place and time  Skin: warm and dry, no rash or erythema  Pulmonary/Chest: clear to auscultation bilaterally- no wheezes, rales or rhonchi, normal air movement, no respiratory distress  Cardiovascular: normal rate, normal S1 and S2, no gallops, intact distal pulses and no carotid bruits  Abdomen: bowel sounds normal, tenderness present- lowe quadrants,   Extremities: no cyanosis and no clubbing  Neurologic: speech normal    I/O last 3 completed shifts:  In: 3100 [P.O.:1500; I.V.:1600]  Out: 2100 [Urine:2100]  No intake/output data recorded.      LABS:  Recent Labs      11/11/16   1014  11/12/16   0420  11/13/16   0425   NA  141  142  144   K  3.5  3.7  4.0   CL  101  102  103   CO2  30*  30*  32*   BUN  7  5*  12   CREATININE  0.6  0.6  0.9   GLUCOSE  111*  76  93   CALCIUM  9.1  8.6  8.6       Recent Labs      11/11/16   1014  11/12/16   0420  11/13/16   0425   WBC  4.2*  5.4  4.9   RBC  3.01*  3.18*  3.10*   HGB  8.6*  8.8*  8.6*   HCT  27.6*  28.8*  28.6*   MCV  91.7  90.6  92.3   MCH  28.6  27.7  27.7   MCHC  31.2*  30.6*  30.1*   RDW  13.2  13.2  13.4   PLT  253  281  269   MPV  9.6  9.7  9.6       No results for input(s): POCGLU in the last 72 hours.    Imaging:  Ct Abdomen Pelvis W Iv Contrast Additional Contrast? None    Result Date: 11/07/2016  Location: 200 Indication: Abdominal pain. Comparison: None available. Technique: Multidetector CT imaging of the abdomen and pelvis was performed after the administration of intravenous contrast (80 cc of Isovue-370). Coronal and sagittal  reformatted images were obtained. Automated dose exposure control was used for this exam. FINDINGS: Lower thorax:  Limited evaluation of the lower chest demonstrates clear lung bases. There is no pleural effusion. Hepatobiliary: The liver is normal in morphology and attenuation. The gallbladder is normal in appearance. Pancreas: Unremarkable. Spleen: Unremarkable. Gastrointestinal: The distal esophagus, stomach, duodenum are normal in appearance. The small bowel loops are normal in caliber. The appendix is not identified, however there is no inflammatory process within the right lower quadrant of the abdomen. The colon is normal in caliber and grossly unremarkable. Adrenal glands: Unremarkable. Genitourinary: Portions of the pelvis are obscured due to streak artifact from right hip arthroplasty. The left kidney is enlarged with mild perinephric fat stranding and slight delayed enhancement compared to the right. There is moderate left hydroureteronephrosis with suggestion of an obstructing 3 mm x 2 mm calculus at the expected location of the left ureterovesical junction (series 2, image 72). There is a punctate nonobstructing calculus at the midpole the left kidney. The right kidney is normal in morphology and size. There is no renal calculus hydronephrosis, or hydroureter on the right. The urinary bladder and uterus are grossly unremarkable.  Vascular: The abdominal aorta is normal in caliber and enhancement. Peritoneum: There is no free fluid within the abdomen or pelvis. There is no pneumoperitoneum. Lymph nodes: No abdominal, retroperitoneal, or pelvic lymphadenopathy. Bones: Patient is post right total hip arthroplasty. There is increased sclerosis within the left femoral head, suggestive of avascular necrosis. No definite subchondral collapse. Soft tissues: Unremarkable.     1. Moderate left hydroureteronephrosis with suggestion of an obstructing 3 mm calculus at the expected location of the left ureterovesical  junction. 2. Additional punctate nonobstructing calculus at the midpole the left kidney. 3. Increased sclerosis within the left femoral head, suggestive of avascular necrosis.       Patient Instructions:   Current Discharge Medication List      START taking these medications    Details   cephALEXin (KEFLEX) 500 MG capsule Take 1  capsule by mouth 3 times daily for 7 days  Qty: 21 capsule, Refills: 0      HYDROcodone-acetaminophen (NORCO) 5-325 MG per tablet Take 1 tablet by mouth every 4 hours as needed for Pain .  Qty: 20 tablet, Refills: 0         CONTINUE these medications which have NOT CHANGED    Details   buPROPion (WELLBUTRIN XL) 150 MG extended release tablet Take 150 mg by mouth every morning      magnesium oxide (MAG-OX) 400 MG tablet Take 400 mg by mouth daily      omeprazole (PRILOSEC) 20 MG delayed release capsule Take 20 mg by mouth Daily      hydrOXYzine (ATARAX) 25 MG tablet Take 25 mg by mouth 3 times daily as needed for Itching      Potassium Chloride Crys ER (KLOR-CON M20 PO) Take 1 tablet by mouth daily      predniSONE (DELTASONE) 5 MG tablet Take 7.5 mg by mouth daily         STOP taking these medications       mirtazapine (REMERON) 15 MG tablet Comments:   Reason for Stopping:                 Note that more3 than 30 minutes was spent in preparing discharge papers, discussing discharge with patient, medication review, etc.    Signed:  Electronically signed by Angeline Slimalishia Madalynn Pickelsimer, CNP on 11/13/2016 at 8:57 AM

## 2016-11-13 NOTE — Plan of Care (Signed)
Problem: Pain:  Goal: Pain level will decrease  Pain level will decrease   Outcome: Ongoing      Problem: Falls - Risk of  Goal: Absence of falls  Outcome: Ongoing      Problem: Discharge Planning:  Goal: Discharged to appropriate level of care  Discharged to appropriate level of care   Outcome: Ongoing

## 2016-11-13 NOTE — Progress Notes (Signed)
[facility] Quality Flow/Interdisciplinary Rounds Progress Note        Quality Flow Rounds held on November 13, 2016    Disciplines Attending:  Bedside Nurse, Social Worker, Case Manager and Nursing Unit Leadership    Tina EndDominique Lutz was admitted on 11/07/2016 10:55 AM    Anticipated Discharge Date:  Expected Discharge Date: 11/09/16    Disposition:    Braden Score:  Braden Scale Score: 20    Readmission Risk              Readmission Risk:        22       Age 33 or Greater:  0    Admitted from SNF or Requires Paid or Family Care:  0    Currently has CHF,COPD,ARF,CRI,or is on dialysis:  0    Takes more than 5 Prescription Medications:  4    Takes Digoxin,Insulin,Anticoagulants,Narcotics or ASA/Plavix:  0    Hospital Admit in Past 12 Months:  10    On Disability:  3    Patient Considers own Health:  5          Discussed patient goal for the day, patient clinical progression, and barriers to discharge.  The following Goal(s) of the Day/Commitment(s) have been identified:  Probable discharge home today      Tina Lutz  November 13, 2016

## 2016-11-13 NOTE — Other (Addendum)
Patient Acct Nbr:  [id number]  Primary AUTH/CERT:  [id number]  Primary Insurance Company Name:   WELFARE  Primary Insurance Plan Name:  [place] [facility]  Primary Insurance Group Number:    Primary Insurance Plan Type: X  Primary Insurance Policy Number:  [id number]

## 2016-11-15 LAB — RENAL CALCULI
MASS: 15 mg
Stone Number: 2
Stone Size: 1 mm

## 2017-07-19 ENCOUNTER — Emergency Department (HOSPITAL_COMMUNITY): Payer: Self-pay

## 2017-07-19 ENCOUNTER — Encounter (HOSPITAL_COMMUNITY): Payer: Self-pay | Admitting: Emergency Medicine

## 2017-07-19 ENCOUNTER — Emergency Department (HOSPITAL_COMMUNITY)
Admission: EM | Admit: 2017-07-19 | Discharge: 2017-07-20 | Disposition: A | Discharge status: Home / Self Care | Payer: Self-pay | Attending: Emergency Medicine | Admitting: Emergency Medicine

## 2017-07-19 DIAGNOSIS — R52 Pain, unspecified: Secondary | ICD-10-CM

## 2017-07-19 DIAGNOSIS — R109 Unspecified abdominal pain: Secondary | ICD-10-CM

## 2017-07-19 DIAGNOSIS — M3210 Systemic lupus erythematosus, organ or system involvement unspecified: Secondary | ICD-10-CM | POA: Insufficient documentation

## 2017-07-19 DIAGNOSIS — R51 Headache: Secondary | ICD-10-CM | POA: Insufficient documentation

## 2017-07-19 DIAGNOSIS — F17210 Nicotine dependence, cigarettes, uncomplicated: Secondary | ICD-10-CM | POA: Insufficient documentation

## 2017-07-19 DIAGNOSIS — R1084 Generalized abdominal pain: Secondary | ICD-10-CM | POA: Insufficient documentation

## 2017-07-19 DIAGNOSIS — M791 Myalgia: Secondary | ICD-10-CM | POA: Insufficient documentation

## 2017-07-19 LAB — COMPREHENSIVE METABOLIC PANEL
ALT: 12 U/L — ABNORMAL LOW (ref 14–54)
AST: 17 U/L (ref 15–41)
Albumin: 4 g/dL (ref 3.5–5.0)
Alkaline Phosphatase: 64 U/L (ref 38–126)
Anion gap: 13 (ref 5–15)
BUN: 10 mg/dL (ref 6–20)
CO2: 23 mmol/L (ref 22–32)
Calcium: 9.2 mg/dL (ref 8.9–10.3)
Chloride: 98 mmol/L — ABNORMAL LOW (ref 101–111)
Creatinine, Ser: 0.79 mg/dL (ref 0.44–1.00)
GFR calc Af Amer: >60 mL/min (ref >60)
GFR calc non Af Amer: >60 mL/min (ref >60)
Glucose, Bld: 110 mg/dL — ABNORMAL HIGH (ref 65–99)
Potassium: 3.7 mmol/L (ref 3.5–5.1)
Sodium: 134 mmol/L — ABNORMAL LOW (ref 135–145)
Total Bilirubin: 1.4 mg/dL — ABNORMAL HIGH (ref 0.3–1.2)
Total Protein: 8.5 g/dL — ABNORMAL HIGH (ref 6.5–8.1)

## 2017-07-19 LAB — URINALYSIS, ROUTINE W REFLEX MICROSCOPIC
Bilirubin Urine: NEGATIVE
Glucose, UA: NEGATIVE mg/dL
Hgb urine dipstick: NEGATIVE
Ketones, ur: 80 mg/dL — AB
Nitrite: NEGATIVE
Protein, ur: 100 mg/dL — AB
Specific Gravity, Urine: 1.025 (ref 1.005–1.030)
pH: 7 (ref 5.0–8.0)

## 2017-07-19 LAB — CBC
HCT: 33.8 % — ABNORMAL LOW (ref 36.0–46.0)
Hemoglobin: 10.8 g/dL — ABNORMAL LOW (ref 12.0–15.0)
MCH: 28.1 pg (ref 26.0–34.0)
MCHC: 32 g/dL (ref 30.0–36.0)
MCV: 88 fL (ref 78.0–100.0)
Platelets: 240 10*3/uL (ref 150–400)
RBC: 3.84 MIL/uL — ABNORMAL LOW (ref 3.87–5.11)
RDW: 14.1 % (ref 11.5–15.5)
WBC: 13.1 10*3/uL — ABNORMAL HIGH (ref 4.0–10.5)

## 2017-07-19 LAB — I-STAT BETA HCG BLOOD, ED (MC, WL, AP ONLY): I-stat hCG, quantitative: <5 m[IU]/mL (ref <5)

## 2017-07-19 LAB — LIPASE, BLOOD: Lipase: 17 U/L (ref 11–51)

## 2017-07-19 MED ORDER — DIPHENHYDRAMINE HCL 50 MG/ML IJ SOLN
25.0000 mg | Freq: Once | INTRAMUSCULAR | Status: AC
  Administered 2017-07-20: 25 mg via INTRAVENOUS
  Filled 2017-07-19: qty 1

## 2017-07-19 MED ORDER — SODIUM CHLORIDE 0.9 % IV BOLUS (SEPSIS)
1000.0000 mL | Freq: Once | INTRAVENOUS | Status: AC
  Administered 2017-07-19: 1000 mL via INTRAVENOUS

## 2017-07-19 MED ORDER — MAGNESIUM SULFATE 2 GM/50ML IV SOLN
2.0000 g | Freq: Once | INTRAVENOUS | Status: AC
  Administered 2017-07-20: 2 g via INTRAVENOUS
  Filled 2017-07-19: qty 50

## 2017-07-19 MED ORDER — IOPAMIDOL (ISOVUE-300) INJECTION 61%
100.0000 mL | Freq: Once | INTRAVENOUS | Status: AC | PRN
  Administered 2017-07-19: 100 mL via INTRAVENOUS

## 2017-07-19 MED ORDER — ACETAMINOPHEN 325 MG PO TABS
650.0000 mg | ORAL_TABLET | Freq: Once | ORAL | Status: AC
  Administered 2017-07-20: 650 mg via ORAL
  Filled 2017-07-19: qty 2

## 2017-07-19 MED ORDER — IOPAMIDOL (ISOVUE-300) INJECTION 61%
INTRAVENOUS | Status: AC
  Filled 2017-07-19: qty 100

## 2017-07-19 MED ORDER — HYDROMORPHONE HCL 1 MG/ML IJ SOLN
1.0000 mg | Freq: Once | INTRAMUSCULAR | Status: AC
  Administered 2017-07-19: 1 mg via INTRAVENOUS
  Filled 2017-07-19: qty 1

## 2017-07-19 MED ORDER — HYDROMORPHONE HCL 1 MG/ML IJ SOLN
1.0000 mg | Freq: Once | INTRAMUSCULAR | Status: AC
  Administered 2017-07-20: 1 mg via INTRAVENOUS
  Filled 2017-07-19: qty 1

## 2017-07-19 MED ORDER — PROCHLORPERAZINE EDISYLATE 5 MG/ML IJ SOLN
10.0000 mg | Freq: Once | INTRAMUSCULAR | Status: AC
Start: 2017-07-19 — End: 2017-07-20
  Administered 2017-07-20: 10 mg via INTRAVENOUS
  Filled 2017-07-19: qty 2
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
## 2017-07-19 NOTE — ED Triage Notes (Signed)
Patient complaining of having pain everywhere. Patient is also complaining of having pain in her abdomen. She states that she has a hx of lupus. She says her hands, elbow, and knees are swollen. Patient states that she has a headache and just dont feel good.

## 2017-07-20 MED ORDER — CEPHALEXIN 500 MG PO CAPS: 500.0000 mg | ORAL_CAPSULE | Freq: Three times a day (TID) | ORAL | 0 refills | Status: DC

## 2017-07-20 MED ORDER — PREDNISONE 20 MG PO TABS
40.0000 mg | ORAL_TABLET | Freq: Once | ORAL | Status: AC
  Administered 2017-07-20: 40 mg via ORAL
  Filled 2017-07-20: qty 2

## 2017-07-20 MED ORDER — OXYCODONE-ACETAMINOPHEN 5-325 MG PO TABS: 1.0000 | ORAL_TABLET | ORAL | 0 refills | Status: DC | PRN

## 2017-07-20 MED ORDER — PREDNISONE 50 MG PO TABS: 50.0000 mg | ORAL_TABLET | Freq: Every day | ORAL | 0 refills | Status: DC

## 2017-07-20 NOTE — ED Notes (Signed)
ED Provider at bedside. 

## 2017-07-22 LAB — URINE CULTURE

## 2017-08-16 ENCOUNTER — Encounter (HOSPITAL_COMMUNITY): Payer: Self-pay | Admitting: Emergency Medicine

## 2017-08-16 DIAGNOSIS — R51 Headache: Secondary | ICD-10-CM | POA: Insufficient documentation

## 2017-08-16 DIAGNOSIS — R42 Dizziness and giddiness: Secondary | ICD-10-CM | POA: Insufficient documentation

## 2017-08-16 DIAGNOSIS — Z79899 Other long term (current) drug therapy: Secondary | ICD-10-CM | POA: Insufficient documentation

## 2017-08-16 DIAGNOSIS — F17210 Nicotine dependence, cigarettes, uncomplicated: Secondary | ICD-10-CM | POA: Insufficient documentation

## 2017-08-16 DIAGNOSIS — R112 Nausea with vomiting, unspecified: Secondary | ICD-10-CM | POA: Insufficient documentation

## 2017-08-16 MED ORDER — ONDANSETRON 4 MG PO TBDP
4.0000 mg | ORAL_TABLET | Freq: Once | ORAL | Status: AC | PRN
  Administered 2017-08-16: 4 mg via ORAL
  Filled 2017-08-16: qty 1

## 2017-08-16 NOTE — ED Triage Notes (Signed)
Pt reports vomiting 4 times since 1400.  States she got weak and fell after one of her vomiting episodes. Endorses hitting the front of her head when she fell. Denies diarrhea or taking blood thinners. Pt took reglan and benadryl for a migraine and it has not helped it or the nausea. A&O x4.

## 2017-08-17 ENCOUNTER — Emergency Department (HOSPITAL_COMMUNITY)
Admission: EM | Admit: 2017-08-17 | Discharge: 2017-08-17 | Disposition: A | Discharge status: Home / Self Care | Payer: Self-pay | Attending: Emergency Medicine | Admitting: Emergency Medicine

## 2017-08-17 ENCOUNTER — Emergency Department (HOSPITAL_COMMUNITY): Payer: Self-pay

## 2017-08-17 DIAGNOSIS — R42 Dizziness and giddiness: Secondary | ICD-10-CM

## 2017-08-17 DIAGNOSIS — R51 Headache: Secondary | ICD-10-CM

## 2017-08-17 DIAGNOSIS — R519 Headache, unspecified: Secondary | ICD-10-CM

## 2017-08-17 LAB — CBC
HCT: 35.3 % — ABNORMAL LOW (ref 36.0–46.0)
Hemoglobin: 11.1 g/dL — ABNORMAL LOW (ref 12.0–15.0)
MCH: 27.8 pg (ref 26.0–34.0)
MCHC: 31.4 g/dL (ref 30.0–36.0)
MCV: 88.5 fL (ref 78.0–100.0)
PLATELETS: 272 10*3/uL (ref 150–400)
RBC: 3.99 MIL/uL (ref 3.87–5.11)
RDW: 13.9 % (ref 11.5–15.5)
WBC: 9.6 10*3/uL (ref 4.0–10.5)

## 2017-08-17 LAB — COMPREHENSIVE METABOLIC PANEL
ALK PHOS: 61 U/L (ref 38–126)
ALT: 12 U/L — AB (ref 14–54)
AST: 16 U/L (ref 15–41)
Albumin: 4.1 g/dL (ref 3.5–5.0)
Anion gap: 9 (ref 5–15)
BUN: 8 mg/dL (ref 6–20)
CALCIUM: 9.3 mg/dL (ref 8.9–10.3)
CO2: 28 mmol/L (ref 22–32)
CREATININE: 0.78 mg/dL (ref 0.44–1.00)
Chloride: 105 mmol/L (ref 101–111)
Glucose, Bld: 108 mg/dL — ABNORMAL HIGH (ref 65–99)
Potassium: 3.7 mmol/L (ref 3.5–5.1)
Sodium: 142 mmol/L (ref 135–145)
Total Bilirubin: 0.3 mg/dL (ref 0.3–1.2)
Total Protein: 7.9 g/dL (ref 6.5–8.1)

## 2017-08-17 LAB — URINALYSIS, ROUTINE W REFLEX MICROSCOPIC
Bilirubin Urine: NEGATIVE
Glucose, UA: NEGATIVE mg/dL
HGB URINE DIPSTICK: NEGATIVE
Ketones, ur: 5 mg/dL — AB
Nitrite: NEGATIVE
Protein, ur: 30 mg/dL — AB
SPECIFIC GRAVITY, URINE: 1.023 (ref 1.005–1.030)
pH: 6 (ref 5.0–8.0)

## 2017-08-17 LAB — LIPASE, BLOOD: Lipase: 24 U/L (ref 11–51)

## 2017-08-17 MED ORDER — FENTANYL CITRATE (PF) 100 MCG/2ML IJ SOLN
50.0000 ug | Freq: Once | INTRAMUSCULAR | Status: AC
  Administered 2017-08-17: 50 ug via INTRAVENOUS
  Filled 2017-08-17: qty 2

## 2017-08-17 MED ORDER — LORAZEPAM 1 MG PO TABS
1.0000 mg | ORAL_TABLET | Freq: Once | ORAL | Status: AC
  Administered 2017-08-17: 1 mg via ORAL
  Filled 2017-08-17: qty 1

## 2017-08-17 MED ORDER — DIAZEPAM 5 MG/ML IJ SOLN: 5.0000 mg | Freq: Once | INTRAMUSCULAR | Status: DC

## 2017-08-17 MED ORDER — ONDANSETRON HCL 4 MG PO TABS: 4.0000 mg | ORAL_TABLET | Freq: Four times a day (QID) | ORAL | 0 refills | Status: DC

## 2017-08-17 MED ORDER — LACTATED RINGERS IV BOLUS (SEPSIS)
1000.0000 mL | Freq: Once | INTRAVENOUS | Status: AC
  Administered 2017-08-17: 1000 mL via INTRAVENOUS

## 2017-08-17 MED ORDER — DIAZEPAM 5 MG PO TABS
5.0000 mg | ORAL_TABLET | ORAL | Status: AC
  Administered 2017-08-17: 5 mg via ORAL
  Filled 2017-08-17: qty 1

## 2017-08-17 MED ORDER — PROCHLORPERAZINE MALEATE 5 MG PO TABS
5.0000 mg | ORAL_TABLET | Freq: Once | ORAL | Status: AC
  Administered 2017-08-17: 5 mg via ORAL
  Filled 2017-08-17: qty 1

## 2017-08-17 MED ORDER — OXYCODONE-ACETAMINOPHEN 5-325 MG PO TABS
1.0000 | ORAL_TABLET | Freq: Once | ORAL | Status: AC
  Administered 2017-08-17: 1 via ORAL
  Filled 2017-08-17: qty 1

## 2017-08-17 MED ORDER — SODIUM CHLORIDE 0.9 % IV BOLUS (SEPSIS): 1000.0000 mL | Freq: Once | INTRAVENOUS | Status: DC

## 2017-08-17 MED ORDER — PROMETHAZINE HCL 25 MG/ML IJ SOLN
25.0000 mg | Freq: Once | INTRAMUSCULAR | Status: AC
  Administered 2017-08-17: 25 mg via INTRAVENOUS
  Filled 2017-08-17: qty 1

## 2017-08-17 MED ORDER — DIPHENHYDRAMINE HCL 50 MG/ML IJ SOLN
25.0000 mg | Freq: Once | INTRAMUSCULAR | Status: AC
  Administered 2017-08-17: 25 mg via INTRAVENOUS
  Filled 2017-08-17: qty 1

## 2017-08-17 NOTE — ED Notes (Signed)
[facility] called. 

## 2017-08-17 NOTE — ED Notes (Signed)
Pt is aware we need a urine sample. 

## 2017-08-17 NOTE — ED Notes (Signed)
Pt asleep but awoken to verbal stimuli.  States her pain has decreased to a 5 from 7.  Asked pt if she may be able to find a ride to [facility] for her MRI and she said she would call around.

## 2017-08-17 NOTE — ED Notes (Signed)
Patient transported to MRI 

## 2017-08-17 NOTE — Discharge Instructions (Signed)
Please read attached information. If you experience any new or worsening signs or symptoms please return to the emergency room for evaluation. Please follow-up with your primary care provider or specialist as discussed. Please use tylenol at home as needed for headache.

## 2017-08-17 NOTE — ED Provider Notes (Signed)
36 year old female sent from [facility] to [facility] for MRI.  Please see previous providers note for full H&P.  Patient presenting with dizziness headache, light sensitivity.  Patient reports a history of migraines, noting this is worse.  Patient having dizziness that is non-positional.  Patient denies any distal neurological deficits.  5:09 PM Upon reassessment pt report she is no longer having dizziness but still have headache as the loud noise from MRI worsened the headache. She reports previously she has had improvement with reglan and diphenhydramine. Pt will receive the above with the addition of a percocet.   Patient reports that she still having headache although improved after medication.  Patient continues to deny dizziness.  Patient neurologically intact.  I have low suspicion for intracranial abnormality, vascular, or any other significant life-threatening etiology in this patient.  She will be discharged in encouraged follow-up as an outpatient with neurology, strict return precautions given.  She verbalized understanding and agreement to today's plan had no further questions or concerns at time discharge.  Vitals:   08/17/17 1641 08/17/17 1652  BP:  103/83  Pulse: 81   Resp:    Temp:    SpO2: 100%       Eyvonne Mechanic, PA-C 08/17/17 1709    Raeford Razor, MD 08/18/17 1144
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
## 2017-08-17 NOTE — ED Notes (Signed)
Pt did not sign signature pad, pt stable and using wheelchair for discharge, understands follow up

## 2017-08-17 NOTE — ED Notes (Signed)
Pt ambulated to the end of the stretcher. Tolerated poorly. Stated that with each step she felt like her head was vibrating. Pt also started to dry heavy and was assisted back to the stretcher. MD will be updated.

## 2017-08-17 NOTE — ED Notes (Signed)
Pt states that she has had 2 more episodes of emesis after receiving the oral zofran.

## 2017-08-17 NOTE — ED Notes (Signed)
Pt with episode of emesis. MD will be made aware

## 2017-08-17 NOTE — ED Notes (Signed)
Asked pt is she is able to provide urine specimen.  Assisted pt in getting socks/shoes on and out of bed.  Pt c/o h/a and appeared to have difficulty walking.  States she had a hip replacement in Feb of this year and it is giving her a lot of trouble.  Taken to the restroom via wheelchair.

## 2017-11-14 ENCOUNTER — Inpatient Hospital Stay (HOSPITAL_COMMUNITY)
Admission: AD | Admit: 2017-11-14 | Discharge: 2017-11-14 | Disposition: A | Discharge status: Home / Self Care | Payer: Medicaid - Out of State | Source: Ambulatory Visit | Attending: Obstetrics and Gynecology | Admitting: Obstetrics and Gynecology

## 2017-11-14 ENCOUNTER — Encounter (HOSPITAL_COMMUNITY): Payer: Self-pay | Admitting: *Deleted

## 2017-11-14 DIAGNOSIS — F17210 Nicotine dependence, cigarettes, uncomplicated: Secondary | ICD-10-CM | POA: Diagnosis not present

## 2017-11-14 DIAGNOSIS — A5901 Trichomonal vulvovaginitis: Secondary | ICD-10-CM | POA: Insufficient documentation

## 2017-11-14 DIAGNOSIS — Z888 Allergy status to other drugs, medicaments and biological substances status: Secondary | ICD-10-CM | POA: Diagnosis not present

## 2017-11-14 DIAGNOSIS — B373 Candidiasis of vulva and vagina: Secondary | ICD-10-CM | POA: Insufficient documentation

## 2017-11-14 DIAGNOSIS — B3731 Acute candidiasis of vulva and vagina: Secondary | ICD-10-CM

## 2017-11-14 DIAGNOSIS — Z202 Contact with and (suspected) exposure to infections with a predominantly sexual mode of transmission: Secondary | ICD-10-CM | POA: Insufficient documentation

## 2017-11-14 DIAGNOSIS — N939 Abnormal uterine and vaginal bleeding, unspecified: Secondary | ICD-10-CM | POA: Diagnosis present

## 2017-11-14 DIAGNOSIS — Z79899 Other long term (current) drug therapy: Secondary | ICD-10-CM | POA: Insufficient documentation

## 2017-11-14 DIAGNOSIS — N898 Other specified noninflammatory disorders of vagina: Secondary | ICD-10-CM | POA: Diagnosis present

## 2017-11-14 DIAGNOSIS — Z113 Encounter for screening for infections with a predominantly sexual mode of transmission: Secondary | ICD-10-CM

## 2017-11-14 HISTORY — DX: Personal history of other infectious and parasitic diseases: Z86.19

## 2017-11-14 LAB — WET PREP, GENITAL
CLUE CELLS WET PREP: NONE SEEN
Sperm: NONE SEEN
Yeast Wet Prep HPF POC: NONE SEEN

## 2017-11-14 LAB — URINALYSIS, ROUTINE W REFLEX MICROSCOPIC
BILIRUBIN URINE: NEGATIVE
Glucose, UA: NEGATIVE mg/dL
HGB URINE DIPSTICK: NEGATIVE
KETONES UR: NEGATIVE mg/dL
NITRITE: NEGATIVE
PH: 6 (ref 5.0–8.0)
Protein, ur: 30 mg/dL — AB
SPECIFIC GRAVITY, URINE: 1.025 (ref 1.005–1.030)

## 2017-11-14 LAB — POCT PREGNANCY, URINE: Preg Test, Ur: NEGATIVE

## 2017-11-14 MED ORDER — METRONIDAZOLE 500 MG PO TABS
2000.0000 mg | ORAL_TABLET | Freq: Once | ORAL | Status: AC
  Administered 2017-11-14: 2000 mg via ORAL
  Filled 2017-11-14: qty 4

## 2017-11-14 MED ORDER — FLUCONAZOLE 150 MG PO TABS: 150.0000 mg | ORAL_TABLET | Freq: Every day | ORAL | 1 refills | Status: DC

## 2017-11-14 NOTE — MAU Provider Note (Signed)
History     CSN: [id number]  Arrival date and time: 11/14/17 1230   First Provider Initiated Contact with Patient 11/14/17 1513      Chief Complaint  Patient presents with  . Vaginal Discharge  . Vaginal Itching  . Vaginal Bleeding   HPI Patient is a 36 y.o. [id number] presents for STD testing. The patient has noted intermenstrual spotting,  has not experienced postcoital bleeding, and does report increased vaginal discharge. Vaginal discharge is thin & white without odor. Endorses vaginal irritation. There is a history of prior sexually transmitted infection(s).    The patient is sexually active and reports 2 current sexual partners . She currently uses Female sterilization: Past: yes for contraception. She never uses condoms or barrier methods to prevent the spread of sexually transmitted infections.   Past Medical History:  Diagnosis Date  . Anemia   . Anxiety   . Arthritis    rheumatoid  . Depression   . Headache(784.0)   . Hx of chlamydia infection 2012  . Hx of gonorrhea 2011  . Hx of trichomoniasis 2015  . Lupus   . Lupus nephritis (HCC)   . Osteonecrosis (HCC)    bilateral legs, worse in right hip.  D/t lupus  . Pericardial effusion    Intermittent. Associated with lupus.    Past Surgical History:  Procedure Laterality Date  . DILATION AND EVACUATION  11/10  . ESOPHAGOGASTRODUODENOSCOPY Left 10/07/2016   Procedure: ESOPHAGOGASTRODUODENOSCOPY (EGD);  Surgeon: Dorena Cookey, MD;  Location: [facility];  Service: Gastroenterology;  Laterality: Left;  . LAPAROSCOPIC TUBAL LIGATION  07/10/2011   Procedure: LAPAROSCOPIC TUBAL LIGATION;  Surgeon: Purcell Nails, MD;  Location: [facility];  Service: Gynecology;  Laterality: Bilateral;  fulguration    Family History  Problem Relation Age of Onset  . Hypertension Mother     Social History   Tobacco Use  . Smoking status: Current Every Day Smoker    Packs/day: 0.25    Years: 7.00    Pack years: 1.75    Types:  Cigarettes  . Smokeless tobacco: Never Used  Substance Use Topics  . Alcohol use: Yes    Alcohol/week: 1.8 oz    Types: 3 Shots of liquor per week  . Drug use: Yes    Frequency: 21.0 times per week    Types: Marijuana    Comment: 3 times per day    Allergies:  Allergies  Allergen Reactions  . Alfalfa Other (See Comments) and Hives  . Metoclopramide Hcl Anaphylaxis    Body started contracting, couldn't breathe, throat swelled up  . Alpha Blocker Quinazolines Hives and Nausea Only  . Aspirin Other (See Comments)    Contraindicated with Lupus  . Cyclobenzaprine Diarrhea  . Enoxaparin Other (See Comments)    "Spasms of muscles in hands and feet"  . Garlic Diarrhea and Nausea And Vomiting  . Nsaids     Flares up her lupus  . Other Nausea And Vomiting  . Pork-Derived Products Diarrhea and Nausea And Vomiting  . Sulfur Other (See Comments)    Flares up her lupus Flares up her lupus Sulfa.   . Tramadol Other (See Comments)    Lupus flare Drug interaction with kidney medicine  . Ciprofloxacin Diarrhea and Nausea And Vomiting    Medications Prior to Admission  Medication Sig Dispense Refill Last Dose  . acetaminophen (TYLENOL) 500 MG tablet Take 1,500 mg every 4 (four) hours as needed by mouth for moderate pain.    11/13/2017 at  Unknown time  . cephALEXin (KEFLEX) 500 MG capsule Take 1 capsule (500 mg total) by mouth 3 (three) times daily. (Patient not taking: Reported on 08/17/2017) 15 capsule 0 Completed Course at Unknown time  . ondansetron (ZOFRAN) 4 MG tablet Take 1 tablet (4 mg total) by mouth every 6 (six) hours. 12 tablet 0   . oxyCODONE-acetaminophen (PERCOCET/ROXICET) 5-325 MG tablet Take 1-2 tablets by mouth every 4 (four) hours as needed for severe pain. (Patient not taking: Reported on 08/17/2017) 10 tablet 0 Completed Course at Unknown time  . predniSONE (DELTASONE) 10 MG tablet Take 10 mg by mouth daily with breakfast.   08/16/2017 at Unknown time  . predniSONE  (DELTASONE) 5 MG tablet Take 10 mg by mouth daily with breakfast.    08/16/2017 at Unknown time  . predniSONE (DELTASONE) 50 MG tablet Take 1 tablet (50 mg total) by mouth daily. (Patient not taking: Reported on 08/17/2017) 6 tablet 0 Completed Course at Unknown time    Review of Systems  Constitutional: Negative.   Gastrointestinal: Negative.   Genitourinary: Positive for vaginal bleeding and vaginal discharge. Negative for dyspareunia, dysuria, genital sores and pelvic pain.   Physical Exam   Blood pressure 107/73, pulse 82, temperature 98.1 F (36.7 C), temperature source Oral, resp. rate 16, height 5' 5.5" (1.664 m), weight 137 lb (62.1 kg), SpO2 100 %.  Physical Exam  Nursing note and vitals reviewed. Constitutional: She is oriented to person, place, and time. She appears well-developed and well-nourished. No distress.  HENT:  Head: Normocephalic and atraumatic.  Eyes: Conjunctivae are normal. Right eye exhibits no discharge. Left eye exhibits no discharge. No scleral icterus.  Neck: Normal range of motion.  Respiratory: Effort normal. No respiratory distress.  Genitourinary: Cervix exhibits no motion tenderness and no friability. There is erythema in the vagina. No bleeding in the vagina. Vaginal discharge (moderate amount of thin yellow discharge, frothy. No odor noted. Small amount of clumpy white discharge adherant to vaginal walls) found.  Neurological: She is alert and oriented to person, place, and time.  Skin: Skin is warm and dry. She is not diaphoretic.  Psychiatric: She has a normal mood and affect. Her behavior is normal. Judgment and thought content normal.    MAU Course  Procedures Results for orders placed or performed during the hospital encounter of 11/14/17 (from the past 24 hour(s))  Urinalysis, Routine w reflex microscopic     Status: Abnormal   Collection Time: 11/14/17  1:19 PM  Result Value Ref Range   Color, Urine YELLOW YELLOW   APPearance CLOUDY (A)  CLEAR   Specific Gravity, Urine 1.025 1.005 - 1.030   pH 6.0 5.0 - 8.0   Glucose, UA NEGATIVE NEGATIVE mg/dL   Hgb urine dipstick NEGATIVE NEGATIVE   Bilirubin Urine NEGATIVE NEGATIVE   Ketones, ur NEGATIVE NEGATIVE mg/dL   Protein, ur 30 (A) NEGATIVE mg/dL   Nitrite NEGATIVE NEGATIVE   Leukocytes, UA LARGE (A) NEGATIVE   RBC / HPF 6-30 0 - 5 RBC/hpf   WBC, UA TOO NUMEROUS TO COUNT 0 - 5 WBC/hpf   Bacteria, UA MANY (A) NONE SEEN   Squamous Epithelial / LPF 0-5 (A) NONE SEEN   Mucus PRESENT   Pregnancy, urine POC     Status: None   Collection Time: 11/14/17  1:27 PM  Result Value Ref Range   Preg Test, Ur NEGATIVE NEGATIVE  Wet prep, genital     Status: Abnormal   Collection Time: 11/14/17  3:17 PM  Result Value Ref Range   Yeast Wet Prep HPF POC NONE SEEN NONE SEEN   Trich, Wet Prep PRESENT (A) NONE SEEN   Clue Cells Wet Prep HPF POC NONE SEEN NONE SEEN   WBC, Wet Prep HPF POC MANY (A) NONE SEEN   Sperm NONE SEEN     MDM UPT negative Patient declines blood work. Wet prep & GC/CT collected Wet prep + trich. Flagyl 2 gm PO given in MAU  Assessment and Plan  A: 1. Trichomonal vaginitis   2. Screen for STD (sexually transmitted disease)   3. Vaginal yeast infection    P: Discharge home Rx diflucan F/u with PCP EPT tx & info sheet given x 2 No intercourse x 1 week after treatment GC/CT pending   Judeth Horn 11/14/2017, 3:13 PM

## 2017-11-14 NOTE — MAU Note (Signed)
Pt reports vaginal itching, spotting, and vaginal discharge.

## 2017-11-14 NOTE — Discharge Instructions (Signed)
In late 2019, the [facility] will be moving to the [facility] campus. At that time, the MAU (Maternity Admissions Unit), where you are being seen today, will no longer see non-pregnant patients. We strongly encourage you to find a doctor's office before that time, so that you can be seen with any GYN concerns, like vaginal discharge, urinary tract infection, etc.. in a timely manner.   In order to make the office visit more convenient, the Center [facility] at [facility] will be offering evening hours from 4pm-7:30pm on Monday. There will be same-day appointments, walk-in appointments and scheduled appointments available during this time. We will be adding more evening hours over the next year before the move.   [facility] @ [facility] [phone]  For urgent needs, [facility] is also available for management of urgent GYN complaints such as vaginal discharge or urinary tract infections.      Trichomoniasis Trichomoniasis is an STI (sexually transmitted infection) that can affect both women and men. In women, the outer area of the female genitalia (vulva) and the vagina are affected. In men, the penis is mainly affected, but the prostate and other reproductive organs can also be involved. This condition can be treated with medicine. It often has no symptoms (is asymptomatic), especially in men. What are the causes? This condition is caused by an organism called Trichomonas vaginalis. Trichomoniasis most often spreads from person to person (is contagious) through sexual contact. What increases the risk? The following factors may make you more likely to develop this condition:  Having unprotected sexual intercourse.  Having sexual intercourse with a partner who has trichomoniasis.  Having multiple sexual partners.  Having had previous trichomoniasis infections or other STIs.  What are the signs or symptoms? In women, symptoms of  trichomoniasis include:  Abnormal vaginal discharge that is clear, white, gray, or yellow-green and foamy and has an unusual "fishy" odor.  Itching and irritation of the vagina and vulva.  Burning or pain during urination or sexual intercourse.  Genital redness and swelling.  In men, symptoms of trichomoniasis include:  Penile discharge that may be foamy or contain pus.  Pain in the penis. This may happen only when urinating.  Itching or irritation inside the penis.  Burning after urination or ejaculation.  How is this diagnosed? In women, this condition may be found during a routine Pap test or physical exam. It may be found in men during a routine physical exam. Your health care provider may perform tests to help diagnose this infection, such as:  Urine tests (men and women).  The following in women: ? Testing the pH of the vagina. ? A vaginal swab test that checks for the Trichomonas vaginalis organism. ? Testing vaginal secretions.  Your health care provider may test you for other STIs, including HIV (human immunodeficiency virus). How is this treated? This condition is treated with medicine taken by mouth (orally), such as metronidazole or tinidazole to fight the infection. Your sexual partner(s) may also need to be tested and treated.  If you are a woman and you plan to become pregnant or think you may be pregnant, tell your health care provider right away. Some medicines that are used to treat the infection should not be taken during pregnancy.  Your health care provider may recommend over-the-counter medicines or creams to help relieve itching or irritation. You may be tested for infection again 3 months after treatment. Follow these instructions at home:  Take and use over-the-counter and prescription medicines, including creams, only as told by your health care provider.  Do not have sexual intercourse until one week after you finish your medicine, or until your  health care provider approves. Ask your health care provider when you may resume sexual intercourse.  (Women) Do not douche or wear tampons while you have the infection.  Discuss your infection with your sexual partner(s). Make sure that your partner gets tested and treated, if necessary.  Keep all follow-up visits as told by your health care provider. This is important. How is this prevented?  Use condoms every time you have sex. Using condoms correctly and consistently can help protect against STIs.  Avoid having multiple sexual partners.  Talk with your sexual partner about any symptoms that either of you may have, as well as any history of STIs.  Get tested for STIs and STDs (sexually transmitted diseases) before you have sex. Ask your partner to do the same.  Do not have sexual contact if you have symptoms of trichomoniasis or another STI. Contact a health care provider if:  You still have symptoms after you finish your medicine.  You develop pain in your abdomen.  You have pain when you urinate.  You have bleeding after sexual intercourse.  You develop a rash.  You feel nauseous or you vomit.  You plan to become pregnant or think you may be pregnant. Summary  Trichomoniasis is an STI (sexually transmitted infection) that can affect both women and men.  This condition often has no symptoms (is asymptomatic), especially in men.  You should not have sexual intercourse until one week after you finish your medicine, or until your health care provider approves. Ask your health care provider when you may resume sexual intercourse.  Discuss your infection with your sexual partner. Make sure that your partner gets tested and treated, if necessary. This information is not intended to replace advice given to you by your health care provider. Make sure you discuss any questions you have with your health care provider. Document Released: 06/12/2001 Document Revised: 11/09/2016  Document Reviewed: 11/09/2016 Elsevier Interactive Patient Education  2017 [employer].

## 2017-11-18 LAB — GC/CHLAMYDIA PROBE AMP (~~LOC~~) NOT AT ARMC
Chlamydia: NEGATIVE
Neisseria Gonorrhea: NEGATIVE

## 2018-01-21 ENCOUNTER — Emergency Department (HOSPITAL_COMMUNITY): Payer: Medicaid - Out of State

## 2018-01-21 ENCOUNTER — Encounter (HOSPITAL_COMMUNITY): Payer: Self-pay | Admitting: Emergency Medicine

## 2018-01-21 ENCOUNTER — Other Ambulatory Visit: Payer: Self-pay

## 2018-01-21 ENCOUNTER — Emergency Department (HOSPITAL_COMMUNITY)
Admission: EM | Admit: 2018-01-21 | Discharge: 2018-01-21 | Disposition: A | Discharge status: Home / Self Care | Payer: Medicaid - Out of State | Attending: Emergency Medicine | Admitting: Emergency Medicine

## 2018-01-21 DIAGNOSIS — D649 Anemia, unspecified: Secondary | ICD-10-CM | POA: Diagnosis present

## 2018-01-21 DIAGNOSIS — F329 Major depressive disorder, single episode, unspecified: Secondary | ICD-10-CM | POA: Diagnosis present

## 2018-01-21 DIAGNOSIS — F32A Depression, unspecified: Secondary | ICD-10-CM | POA: Diagnosis present

## 2018-01-21 DIAGNOSIS — G8929 Other chronic pain: Secondary | ICD-10-CM | POA: Insufficient documentation

## 2018-01-21 DIAGNOSIS — Z96643 Presence of artificial hip joint, bilateral: Secondary | ICD-10-CM

## 2018-01-21 DIAGNOSIS — Z886 Allergy status to analgesic agent status: Secondary | ICD-10-CM | POA: Diagnosis not present

## 2018-01-21 DIAGNOSIS — R0602 Shortness of breath: Secondary | ICD-10-CM | POA: Diagnosis not present

## 2018-01-21 DIAGNOSIS — Z96641 Presence of right artificial hip joint: Secondary | ICD-10-CM | POA: Diagnosis not present

## 2018-01-21 DIAGNOSIS — Z96642 Presence of left artificial hip joint: Secondary | ICD-10-CM | POA: Insufficient documentation

## 2018-01-21 DIAGNOSIS — M3210 Systemic lupus erythematosus, organ or system involvement unspecified: Secondary | ICD-10-CM | POA: Diagnosis not present

## 2018-01-21 DIAGNOSIS — L930 Discoid lupus erythematosus: Secondary | ICD-10-CM | POA: Diagnosis not present

## 2018-01-21 DIAGNOSIS — F17210 Nicotine dependence, cigarettes, uncomplicated: Secondary | ICD-10-CM | POA: Insufficient documentation

## 2018-01-21 DIAGNOSIS — M25562 Pain in left knee: Secondary | ICD-10-CM | POA: Diagnosis present

## 2018-01-21 DIAGNOSIS — IMO0002 Reserved for concepts with insufficient information to code with codable children: Secondary | ICD-10-CM | POA: Diagnosis present

## 2018-01-21 DIAGNOSIS — M329 Systemic lupus erythematosus, unspecified: Secondary | ICD-10-CM | POA: Diagnosis present

## 2018-01-21 DIAGNOSIS — T398X1A Poisoning by other nonopioid analgesics and antipyretics, not elsewhere classified, accidental (unintentional), initial encounter: Secondary | ICD-10-CM | POA: Diagnosis not present

## 2018-01-21 LAB — COMPREHENSIVE METABOLIC PANEL
ALBUMIN: 4.1 g/dL (ref 3.5–5.0)
ALK PHOS: 61 U/L (ref 38–126)
ALT: 11 U/L — AB (ref 14–54)
AST: 21 U/L (ref 15–41)
Anion gap: 7 (ref 5–15)
BILIRUBIN TOTAL: 0.7 mg/dL (ref 0.3–1.2)
BUN: 11 mg/dL (ref 6–20)
CALCIUM: 9.3 mg/dL (ref 8.9–10.3)
CO2: 26 mmol/L (ref 22–32)
CREATININE: 0.76 mg/dL (ref 0.44–1.00)
Chloride: 106 mmol/L (ref 101–111)
GFR calc Af Amer: >60 mL/min (ref >60)
GFR calc non Af Amer: >60 mL/min (ref >60)
GLUCOSE: 82 mg/dL (ref 65–99)
Potassium: 3.9 mmol/L (ref 3.5–5.1)
Sodium: 139 mmol/L (ref 135–145)
TOTAL PROTEIN: 8.5 g/dL — AB (ref 6.5–8.1)

## 2018-01-21 LAB — CBC WITH DIFFERENTIAL/PLATELET
Basophils Absolute: 0.1 10*3/uL (ref 0.0–0.1)
Basophils Relative: 1 %
Eosinophils Absolute: 0.1 10*3/uL (ref 0.0–0.7)
Eosinophils Relative: 2 %
HEMATOCRIT: 36.9 % (ref 36.0–46.0)
HEMOGLOBIN: 11.6 g/dL — AB (ref 12.0–15.0)
LYMPHS ABS: 1.3 10*3/uL (ref 0.7–4.0)
Lymphocytes Relative: 21 %
MCH: 28.4 pg (ref 26.0–34.0)
MCHC: 31.4 g/dL (ref 30.0–36.0)
MCV: 90.2 fL (ref 78.0–100.0)
MONOS PCT: 7 %
Monocytes Absolute: 0.4 10*3/uL (ref 0.1–1.0)
NEUTROS ABS: 4.3 10*3/uL (ref 1.7–7.7)
Neutrophils Relative %: 69 %
Platelets: 295 10*3/uL (ref 150–400)
RBC: 4.09 MIL/uL (ref 3.87–5.11)
RDW: 13.5 % (ref 11.5–15.5)
WBC: 6.1 10*3/uL (ref 4.0–10.5)

## 2018-01-21 LAB — URINALYSIS, ROUTINE W REFLEX MICROSCOPIC
BILIRUBIN URINE: NEGATIVE
GLUCOSE, UA: NEGATIVE mg/dL
HGB URINE DIPSTICK: NEGATIVE
KETONES UR: NEGATIVE mg/dL
Leukocytes, UA: NEGATIVE
Nitrite: NEGATIVE
PROTEIN: NEGATIVE mg/dL
Specific Gravity, Urine: 1.025 (ref 1.005–1.030)
pH: 6 (ref 5.0–8.0)

## 2018-01-21 LAB — ACETAMINOPHEN LEVEL: Acetaminophen (Tylenol), Serum: <10 ug/mL — ABNORMAL LOW (ref 10–30)

## 2018-01-21 MED ORDER — METHYLPREDNISOLONE SODIUM SUCC 125 MG IJ SOLR
125.0000 mg | Freq: Once | INTRAMUSCULAR | Status: AC
  Administered 2018-01-21: 125 mg via INTRAVENOUS
  Filled 2018-01-21: qty 2

## 2018-01-21 MED ORDER — OXYCODONE HCL 5 MG PO TABS: 5.0000 mg | ORAL_TABLET | Freq: Three times a day (TID) | ORAL | 0 refills | Status: DC | PRN

## 2018-01-21 MED ORDER — HYDROMORPHONE HCL 1 MG/ML IJ SOLN
1.0000 mg | Freq: Once | INTRAMUSCULAR | Status: AC
  Administered 2018-01-21: 1 mg via INTRAVENOUS
  Filled 2018-01-21: qty 1

## 2018-01-21 MED ORDER — SODIUM CHLORIDE 0.9 % IV SOLN
INTRAVENOUS | Status: DC
  Administered 2018-01-21: 13:00:00 via INTRAVENOUS

## 2018-01-21 MED ORDER — SODIUM CHLORIDE 0.9 % IV BOLUS (SEPSIS)
1000.0000 mL | Freq: Once | INTRAVENOUS | Status: AC
  Administered 2018-01-21: 1000 mL via INTRAVENOUS

## 2018-01-21 NOTE — ED Notes (Signed)
Bed: WA03 Expected date:  Expected time:  Means of arrival:  Comments: 
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
## 2018-01-21 NOTE — ED Triage Notes (Signed)
Pt states she has lupus and has been in a flare up since Saturday  Pt states she has had a hard time even getting out of bed since then  Pt is c/o pain all over  Pt states she went to go to the bathroom earlier tonight and fell, c/o left knee pain from the fall

## 2018-01-21 NOTE — Discharge Instructions (Signed)
1)Follow-up with your rheumatologist and primary care physician for recheck and reevaluation 2) take medications as prescribed

## 2018-01-21 NOTE — Consult Note (Signed)
Patient Demographics:    Evelyn [name], is a 37 y.o. female  MRN: [id number]   DOB - [birth year]  Admit Date - 01/21/2018  Outpatient Primary MD for the patient is Patient, No Pcp Per   Assessment & Plan:    Principal Problem:   SLE/Lupus Active Problems:   Anemia   Depressive disorder   H/O bilateral hip replacements   1)Generalized Aches/Pain- ???? Lupus related, CBC, CMP, CXR, Lt knee Xrays reviewed, no significant abnormalities, imaging or lab studies reviewed with patient and his sister at bedside, patient requesting Rheumatology input.  Patient became upset and his sister was yelling when told that we do not have in-house rheumatology here.  Dr Eudelia Bunch reviewed patient's chart and went in to speak with patient and sister.  ED charge RN went in to speak with pt and sister. Patient remained very upset/irate.  She Insisted that she was under the impression that she will be admitted to the hospital for IV pain medications and pain control.  Please note the patient was walking around the room initially smiling and joking with her sister until she was told that she would not be admitted then she became very upset, she complained of left knee pain however she was walking around the room with no problems.  Patient refuses to be scheduled with any local rheumatologist in the [place] area , patient and her sister states that the local rheumatologist here "messed" her up.... Previously, the patient would like to follow-up with her rheumatologist in [place] instead. Patient requested admission to the hospital for IV pain medications/pain control. Patient was again reminded that her physical exam, lab and imaging studies does not indicate any qualifying diagnosis for admission to the hospital at this time.  Patient and her  sister were very loud, very upset/irate using foul language/cursing repeatedly. Patient advised to follow-up with rheumatology as outpatient  Dr Katherine Mantle hospitalist director came down to talk to patient and her sister, patient is now agreeable to leave with a prescription for oxycodone until she can be seen by her rheumatologist in [place].  She will call her rheumatologist to see if steroids and Plaquenil dosage needs to be adjusted prior to her next rheumatology visit in [place]  Discharge Instructions:- 1)Follow-up with your rheumatologist and primary care physician for recheck and reevaluation  2) take medications as prescribed   2)Social/Ethics-review of care everywhere records from [facility] clinic in [place] indicates concerns about " drug-seeking behavior".  I did not raised the issue of possible drug-seeking behavior with patient or her sister at all.   Patient will be discharged home from the ED at this time, thank you Dr Freida Busman for this ED consult.   With History of - Reviewed by me  Past Medical History:  Diagnosis Date  . Anemia   . Anxiety   . Arthritis    rheumatoid  . Depression   . Headache(784.0)   . Hx of chlamydia infection  2012  . Hx of gonorrhea 2011  . Hx of trichomoniasis 2015  . Lupus   . Lupus nephritis (HCC)   . Osteonecrosis (HCC)    bilateral legs, worse in right hip.  D/t lupus  . Pericardial effusion    Intermittent. Associated with lupus.      Past Surgical History:  Procedure Laterality Date  . DILATION AND EVACUATION  11/10  . ESOPHAGOGASTRODUODENOSCOPY Left 10/07/2016   Procedure: ESOPHAGOGASTRODUODENOSCOPY (EGD);  Surgeon: Dorena Cookey, MD;  Location: [facility];  Service: Gastroenterology;  Laterality: Left;  . LAPAROSCOPIC TUBAL LIGATION  07/10/2011   Procedure: LAPAROSCOPIC TUBAL LIGATION;  Surgeon: Purcell Nails, MD;  Location: [facility];  Service: Gynecology;  Laterality: Bilateral;  fulguration      Chief Complaint  Patient  presents with  . Generalized Body Aches  . Fall      HPI:    Evelyn [name]  is a 37 y.o. female with past medical history relevant for systemic lupus erythematosus, chronic anemia, status post bilateral hip replacement, history of depression/mood disorder, and as per care everywhere records   drug-seeking behavior who presents to the ED with complains of generalized aches and pain, left knee pain and s/p post fall with possible left knee injury.  Apparently no loss of consciousness, no neck pain, no headache, no visual disturbance, denies frank syncope, no chest pains no palpitations no vomiting or diarrhea no fevers or chills.  No Urinary symptoms no abdominal pain just generalized myalgias. States that she is taking 50 tablets of 650 mg tablets over the past 4 days (however serum Tylenol levels is not elevated).  Denies any abdominal discomfort.    No vomiting or diarrhea.  Additional history obtained from patient's sister at bedside, records from care everywhere reviewed  In ED.... Initially patient was very pleasant and joking with her sister laughing, patient stood up by the computer while her labs and imaging studies were reviewed by myself with hier sister present.  Exam, visit and interaction with pleasant/polite.   However when patient was informed by me that her current exam, lab and imaging studies does not indicate any qualifying diagnosis for admission to the hospital she became very upset, irate,  patient was loud, using foul language and swearing.  She insisted on being admitted to the hospital for IV opiates and pain control, patient requested a second opinion, chart was reviewed with the ED physician Dr. Eudelia Bunch, Dr. Eudelia Bunch went in and spoke with patient and her sister.  Please see further documentation below and above  Patient ED visit was prolonged because she was refusing to leave the ED, she was insistent on being admitted to the hospital for IV opiates for pain  control    1)Generalized Aches/Pain- ???? Lupus related, CBC, CMP, CXR, Lt knee Xrays reviewed, no significant abnormalities, imaging or lab studies reviewed with patient and his sister at bedside, patient requesting Rheumatology input.  Patient became upset and his sister was yelling when told that we do not have in-house rheumatology here.  Dr Eudelia Bunch reviewed patient's chart and went in to speak with patient and sister.  ED charge RN went in to speak with pt and sister. Patient remained very upset/irate.  She Insisted that she was under the impression that she will be admitted to the hospital for IV pain medications and pain control.  Please note the patient was walking around the room initially smiling and joking with her sister until she was told that she would not be  admitted then she became very upset, she complained of left knee pain however she was walking around the room with no problems.  Patient refuses to be scheduled with any local rheumatologist in the [place] area , patient and her sister states that the local rheumatologist here "messed" her up.... Previously, the patient would like to follow-up with her rheumatologist in [place] instead. Patient requested admission to the hospital for IV pain medications/pain control. Patient was again reminded that her physical exam, lab and imaging studies does not indicate any qualifying diagnosis for admission to the hospital at this time.  Patient and her sister were very loud, very upset/irate using foul language/cursing repeatedly. Patient advised to follow-up with rheumatology as outpatient  Dr Katherine Mantle, the hospitalist director came down to talk to patient and her sister, patient is now agreeable to leave with a prescription for oxycodone until she can be seen by her rheumatologist in [place].  She will call her rheumatologist to see if steroids and Plaquenil dosage needs to be adjusted prior to her next rheumatology visit in  [place]  Discharge Instructions:- 1)Follow-up with your rheumatologist and primary care physician for recheck and reevaluation  2) take medications as prescribed   2)Social/Ethics-review of care everywhere records from [facility] clinic in [place] indicates concerns about " drug-seeking behavior".  I did not raised the issue of possible drug-seeking behavior with patient or her sister at all.   Patient will be discharged home from the ED at this time, thank you Dr Freida Busman for this ED consult.    Review of systems:    In addition to the HPI above,   A full 12 point Review of 10 Systems was done, except as stated above, all other Review of 10 Systems were negative.    Social History:  Reviewed by me    Social History   Tobacco Use  . Smoking status: Current Every Day Smoker    Packs/day: 0.25    Years: 7.00    Pack years: 1.75    Types: Cigarettes  . Smokeless tobacco: Never Used  Substance Use Topics  . Alcohol use: Yes    Alcohol/week: 1.8 oz    Types: 3 Shots of liquor per week       Family History :  Reviewed by me    Family History  Problem Relation Age of Onset  . Hypertension Mother      Home Medications:   Prior to Admission medications   Medication Sig Start Date End Date Taking? Authorizing Provider  acetaminophen (TYLENOL) 500 MG tablet Take 1,500 mg every 4 (four) hours as needed by mouth for moderate pain.    Yes [provider]  predniSONE (DELTASONE) 10 MG tablet Take 10 mg by mouth daily with breakfast.   Yes [provider]  fluconazole (DIFLUCAN) 150 MG tablet Take 1 tablet (150 mg total) daily by mouth. Patient not taking: Reported on 01/21/2018 11/14/17   Judeth Horn, NP  ondansetron (ZOFRAN) 4 MG tablet Take 1 tablet (4 mg total) by mouth every 6 (six) hours. Patient not taking: Reported on 01/21/2018 08/17/17   Hedges, Tinnie Gens, PA-C  oxyCODONE (ROXICODONE) 5 MG immediate release tablet Take 1 tablet (5 mg total) by mouth  every 8 (eight) hours as needed for severe pain. 01/21/18   Shon Hale, MD     Allergies:     Allergies  Allergen Reactions  . Alfalfa Other (See Comments) and Hives  . Metoclopramide Hcl Anaphylaxis    Body started contracting,  couldn't breathe, throat swelled up  . Alpha Blocker Quinazolines Hives and Nausea Only  . Aspirin Other (See Comments)    Contraindicated with Lupus  . Cyclobenzaprine Diarrhea  . Enoxaparin Other (See Comments)    "Spasms of muscles in hands and feet"  . Garlic Diarrhea and Nausea And Vomiting  . Nsaids     Flares up her lupus  . Other Nausea And Vomiting  . Pork-Derived Products Diarrhea and Nausea And Vomiting  . Sulfur Other (See Comments)    Flares up her lupus Flares up her lupus Sulfa.   . Tramadol Other (See Comments)    Lupus flare Drug interaction with kidney medicine  . Ciprofloxacin Diarrhea and Nausea And Vomiting     Physical Exam:   Vitals  Blood pressure 103/77, pulse 100, temperature 97.6 F (36.4 C), temperature source Oral, resp. rate 12, height 5\' 5"  (1.651 m), weight 55.8 kg (123 lb), last menstrual period 06/21/2017, SpO2 100 %.  Physical Examination: General appearance - alert, well appearing, and in no distress Mental status - alert, oriented to person, place, and time, very upset, irate Eyes - sclera anicteric Neck - supple, no JVD elevation , Chest - clear  to auscultation bilaterally, symmetrical air movement,  Heart - S1 and S2 normal,  Abdomen - soft, nontender, nondistended, no masses or organomegaly Neurological - screening mental status exam normal, neck supple without rigidity, cranial nerves II through XII intact, DTR's normal and symmetric Extremities - no pedal edema noted, intact peripheral pulses , left knee range of motion is full and without crepitus, deformity, overlying skin without erythema swelling or rash.  Please note the patient was able to ambulate around the room with no limping or other  obvious signs of discomfort on weightbearing on the left foot/knee Skin - warm, dry Psych-very upset    Data Review:    CBC Recent Labs  Lab 01/21/18 1048  WBC 6.1  HGB 11.6*  HCT 36.9  PLT 295  MCV 90.2  MCH 28.4  MCHC 31.4  RDW 13.5  LYMPHSABS 1.3  MONOABS 0.4  EOSABS 0.1  BASOSABS 0.1   ------------------------------------------------------------------------------------------------------------------  Chemistries  Recent Labs  Lab 01/21/18 1048  NA 139  K 3.9  CL 106  CO2 26  GLUCOSE 82  BUN 11  CREATININE 0.76  CALCIUM 9.3  AST 21  ALT 11*  ALKPHOS 61  BILITOT 0.7   ------------------------------------------------------------------------------------------------------------------ estimated creatinine clearance is 87.3 mL/min (by C-G formula based on SCr of 0.76 mg/dL). ------------------------------------------------------------------------------------------------------------------ No results for input(s): TSH, T4TOTAL, T3FREE, THYROIDAB in the last 72 hours.  Invalid input(s): FREET3   Coagulation profile No results for input(s): INR, PROTIME in the last 168 hours. ------------------------------------------------------------------------------------------------------------------- No results for input(s): DDIMER in the last 72 hours. -------------------------------------------------------------------------------------------------------------------  Cardiac Enzymes No results for input(s): CKMB, TROPONINI, MYOGLOBIN in the last 168 hours.  Invalid input(s): CK ------------------------------------------------------------------------------------------------------------------ No results found for: BNP   ---------------------------------------------------------------------------------------------------------------  Urinalysis    Component Value Date/Time   COLORURINE YELLOW 01/21/2018 1029   APPEARANCEUR CLEAR 01/21/2018 1029   LABSPEC 1.025  01/21/2018 1029   PHURINE 6.0 01/21/2018 1029   GLUCOSEU NEGATIVE 01/21/2018 1029   HGBUR NEGATIVE 01/21/2018 1029   BILIRUBINUR NEGATIVE 01/21/2018 1029   KETONESUR NEGATIVE 01/21/2018 1029   PROTEINUR NEGATIVE 01/21/2018 1029   UROBILINOGEN 0.2 08/28/2015 1620   NITRITE NEGATIVE 01/21/2018 1029   LEUKOCYTESUR NEGATIVE 01/21/2018 1029    ----------------------------------------------------------------------------------------------------------------   Imaging Results:    Dg Chest 2 View  Result Date: 01/21/2018 CLINICAL DATA:  Cough, shortness of Breath EXAM: CHEST  2 VIEW COMPARISON:  10/03/2016 FINDINGS: Heart and mediastinal contours are within normal limits. No focal opacities or effusions. No acute bony abnormality. IMPRESSION: No active cardiopulmonary disease. Electronically Signed   By: Charlett Nose M.D.   On: 01/21/2018 09:57   Dg Knee Complete 4 Views Left  Result Date: 01/21/2018 CLINICAL DATA:  Fall, left knee pain EXAM: LEFT KNEE - COMPLETE 4+ VIEW COMPARISON:  04/13/2016 FINDINGS: No evidence of fracture, dislocation, or joint effusion. No evidence of arthropathy or other focal bone abnormality. Soft tissues are unremarkable. IMPRESSION: Negative. Electronically Signed   By: Charlett Nose M.D.   On: 01/21/2018 09:59    Radiological Exams on Admission: Dg Chest 2 View  Result Date: 01/21/2018 CLINICAL DATA:  Cough, shortness of Breath EXAM: CHEST  2 VIEW COMPARISON:  10/03/2016 FINDINGS: Heart and mediastinal contours are within normal limits. No focal opacities or effusions. No acute bony abnormality. IMPRESSION: No active cardiopulmonary disease. Electronically Signed   By: Charlett Nose M.D.   On: 01/21/2018 09:57   Dg Knee Complete 4 Views Left  Result Date: 01/21/2018 CLINICAL DATA:  Fall, left knee pain EXAM: LEFT KNEE - COMPLETE 4+ VIEW COMPARISON:  04/13/2016 FINDINGS: No evidence of fracture, dislocation, or joint effusion. No evidence of arthropathy or other focal  bone abnormality. Soft tissues are unremarkable. IMPRESSION: Negative. Electronically Signed   By: Charlett Nose M.D.   On: 01/21/2018 09:59    Family Communication: Admission, patients condition and plan of care including tests being ordered have been discussed with the patient and sister who indicate understanding and agree with the plan   Code Status - Full Code  Likely DC to  home  Condition   stable  Shon Hale M.D on 01/21/2018 at 3:56 PM   Between 7am to 7pm - Pager - [phone] After 7pm go to [email or web address] - password [email or web address]  [facility] - Office  [phone]  Voice Recognition Reubin Milan dictation system was used to create this note, attempts have been made to correct errors. Please contact the author with questions and/or clarifications.

## 2018-01-21 NOTE — Progress Notes (Signed)
Pt gave permission for her sister to be in room while nursing admission hx was done. Briscoe Burns BSN, RN-BC Admissions RN 01/21/2018 4:52 PM

## 2018-01-21 NOTE — ED Notes (Signed)
Attempted x 2 to obtain IV access, no success, another RN will attempt  Delay in meds admin

## 2018-01-21 NOTE — ED Notes (Signed)
Pt is alert and oriented x 4 and is verbally responsive. Pt reports that she has generalized aches.

## 2018-01-21 NOTE — ED Notes (Signed)
Dr. Marisa Severin, Charge RN, and writer present explanation as to pt stability to be discharge, and to follow up with Rheumatology.

## 2018-01-21 NOTE — ED Notes (Addendum)
Pt was refusing to be discharge, and wanted to be admitted and desires  speak with a second MD ,Dr. Eudelia Bunch was made aware and came to spoke with pt.  Dr. Marisa Severin was made aware pt concerns. Charge has been made aware.

## 2018-08-14 ENCOUNTER — Emergency Department (HOSPITAL_COMMUNITY)
Admission: EM | Admit: 2018-08-14 | Discharge: 2018-08-14 | Disposition: A | Discharge status: Home / Self Care | Payer: Medicaid - Out of State | Attending: Emergency Medicine | Admitting: Emergency Medicine

## 2018-08-14 ENCOUNTER — Encounter (HOSPITAL_COMMUNITY): Payer: Self-pay | Admitting: Obstetrics and Gynecology

## 2018-08-14 ENCOUNTER — Other Ambulatory Visit: Payer: Self-pay

## 2018-08-14 DIAGNOSIS — H6691 Otitis media, unspecified, right ear: Secondary | ICD-10-CM | POA: Insufficient documentation

## 2018-08-14 DIAGNOSIS — H6690 Otitis media, unspecified, unspecified ear: Secondary | ICD-10-CM

## 2018-08-14 DIAGNOSIS — H9201 Otalgia, right ear: Secondary | ICD-10-CM | POA: Diagnosis present

## 2018-08-14 DIAGNOSIS — Z7952 Long term (current) use of systemic steroids: Secondary | ICD-10-CM | POA: Insufficient documentation

## 2018-08-14 DIAGNOSIS — M069 Rheumatoid arthritis, unspecified: Secondary | ICD-10-CM | POA: Insufficient documentation

## 2018-08-14 DIAGNOSIS — R220 Localized swelling, mass and lump, head: Secondary | ICD-10-CM | POA: Insufficient documentation

## 2018-08-14 DIAGNOSIS — M3214 Glomerular disease in systemic lupus erythematosus: Secondary | ICD-10-CM | POA: Diagnosis not present

## 2018-08-14 MED ORDER — ACETAMINOPHEN 500 MG PO TABS
1000.0000 mg | ORAL_TABLET | Freq: Once | ORAL | Status: AC
  Administered 2018-08-14: 1000 mg via ORAL
  Filled 2018-08-14: qty 2

## 2018-08-14 MED ORDER — AMOXICILLIN-POT CLAVULANATE 875-125 MG PO TABS: 1.0000 | ORAL_TABLET | Freq: Two times a day (BID) | ORAL | 0 refills | Status: DC

## 2018-08-14 MED ORDER — AMOXICILLIN-POT CLAVULANATE 875-125 MG PO TABS
1.0000 | ORAL_TABLET | Freq: Once | ORAL | Status: AC
  Administered 2018-08-14: 1 via ORAL
  Filled 2018-08-14: qty 1
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
## 2018-08-14 NOTE — ED Triage Notes (Signed)
Pt reports pain in the right ear. Pt reports it hurts to eat, talk, and swallow.  Pt reports she has never had an ear infection. Pt also reports swelling to her face and hands, RN sees no evidence of swelling. Pt is speaking in complete sentences without difficulty.

## 2018-08-14 NOTE — Discharge Instructions (Signed)
You have an ear infection that is causing your pain, please take Augmentin twice daily for the next week as directed it is very important that you complete your entire course of antibiotics.  If symptoms are not improving after 2 days of antibiotics please return for further evaluation, otherwise please follow-up with your regular doctor on Monday for recheck to ensure your ear infection is improving.  If you develop high persistent fevers, significantly worse in the ear pain, pain or redness behind the ear, severe headache or neck stiffness return to the emergency department for reevaluation.

## 2019-01-10 ENCOUNTER — Emergency Department (HOSPITAL_COMMUNITY)
Admission: EM | Admit: 2019-01-10 | Discharge: 2019-01-11 | Disposition: A | Discharge status: Home / Self Care | Payer: Medicaid - Out of State | Attending: Emergency Medicine | Admitting: Emergency Medicine

## 2019-01-10 ENCOUNTER — Emergency Department (HOSPITAL_COMMUNITY): Payer: Medicaid - Out of State

## 2019-01-10 ENCOUNTER — Encounter (HOSPITAL_COMMUNITY): Payer: Self-pay | Admitting: Emergency Medicine

## 2019-01-10 DIAGNOSIS — Z96643 Presence of artificial hip joint, bilateral: Secondary | ICD-10-CM | POA: Diagnosis not present

## 2019-01-10 DIAGNOSIS — B349 Viral infection, unspecified: Secondary | ICD-10-CM | POA: Diagnosis not present

## 2019-01-10 DIAGNOSIS — R55 Syncope and collapse: Secondary | ICD-10-CM | POA: Diagnosis not present

## 2019-01-10 DIAGNOSIS — R51 Headache: Secondary | ICD-10-CM | POA: Insufficient documentation

## 2019-01-10 DIAGNOSIS — F17210 Nicotine dependence, cigarettes, uncomplicated: Secondary | ICD-10-CM | POA: Diagnosis not present

## 2019-01-10 DIAGNOSIS — Y939 Activity, unspecified: Secondary | ICD-10-CM | POA: Diagnosis not present

## 2019-01-10 DIAGNOSIS — Y929 Unspecified place or not applicable: Secondary | ICD-10-CM | POA: Insufficient documentation

## 2019-01-10 DIAGNOSIS — W19XXXA Unspecified fall, initial encounter: Secondary | ICD-10-CM | POA: Insufficient documentation

## 2019-01-10 DIAGNOSIS — Z79899 Other long term (current) drug therapy: Secondary | ICD-10-CM | POA: Diagnosis not present

## 2019-01-10 DIAGNOSIS — Y999 Unspecified external cause status: Secondary | ICD-10-CM | POA: Insufficient documentation

## 2019-01-10 DIAGNOSIS — M25551 Pain in right hip: Secondary | ICD-10-CM | POA: Diagnosis not present

## 2019-01-10 LAB — BASIC METABOLIC PANEL
Anion gap: 11 (ref 5–15)
BUN: 5 mg/dL — ABNORMAL LOW (ref 6–20)
CO2: 18 mmol/L — ABNORMAL LOW (ref 22–32)
Calcium: 8.6 mg/dL — ABNORMAL LOW (ref 8.9–10.3)
Chloride: 106 mmol/L (ref 98–111)
Creatinine, Ser: 0.72 mg/dL (ref 0.44–1.00)
Glucose, Bld: 89 mg/dL (ref 70–99)
Potassium: 3.6 mmol/L (ref 3.5–5.1)
Sodium: 135 mmol/L (ref 135–145)

## 2019-01-10 LAB — CBC WITH DIFFERENTIAL/PLATELET
Abs Immature Granulocytes: 0.02 10*3/uL (ref 0.00–0.07)
BASOS ABS: 0.1 10*3/uL (ref 0.0–0.1)
Basophils Relative: 1 %
EOS ABS: 0 10*3/uL (ref 0.0–0.5)
Eosinophils Relative: 0 %
HCT: 39.6 % (ref 36.0–46.0)
Hemoglobin: 11 g/dL — ABNORMAL LOW (ref 12.0–15.0)
IMMATURE GRANULOCYTES: 0 %
Lymphocytes Relative: 17 %
Lymphs Abs: 0.9 10*3/uL (ref 0.7–4.0)
MCH: 27.5 pg (ref 26.0–34.0)
MCHC: 27.8 g/dL — ABNORMAL LOW (ref 30.0–36.0)
MCV: 99 fL (ref 80.0–100.0)
Monocytes Absolute: 0.4 10*3/uL (ref 0.1–1.0)
Monocytes Relative: 7 %
NEUTROS PCT: 75 %
NRBC: 0 % (ref 0.0–0.2)
Neutro Abs: 4.1 10*3/uL (ref 1.7–7.7)
PLATELETS: 167 10*3/uL (ref 150–400)
RBC: 4 MIL/uL (ref 3.87–5.11)
RDW: 12.4 % (ref 11.5–15.5)
WBC: 5.5 10*3/uL (ref 4.0–10.5)

## 2019-01-10 LAB — POC URINE PREG, ED: Preg Test, Ur: NEGATIVE

## 2019-01-10 MED ORDER — SODIUM CHLORIDE 0.9 % IV BOLUS
1000.0000 mL | Freq: Once | INTRAVENOUS | Status: AC
  Administered 2019-01-10: 1000 mL via INTRAVENOUS

## 2019-01-10 MED ORDER — ACETAMINOPHEN 500 MG PO TABS
1000.0000 mg | ORAL_TABLET | Freq: Once | ORAL | Status: AC
  Administered 2019-01-10: 1000 mg via ORAL
  Filled 2019-01-10: qty 2

## 2019-01-10 MED ORDER — FENTANYL CITRATE (PF) 100 MCG/2ML IJ SOLN
50.0000 ug | Freq: Once | INTRAMUSCULAR | Status: AC
  Administered 2019-01-10: 50 ug via INTRAVENOUS
  Filled 2019-01-10: qty 2

## 2019-01-10 MED ORDER — METHYLPREDNISOLONE SODIUM SUCC 125 MG IJ SOLR
125.0000 mg | Freq: Once | INTRAMUSCULAR | Status: AC
  Administered 2019-01-10: 125 mg via INTRAVENOUS
  Filled 2019-01-10: qty 2

## 2019-01-10 NOTE — ED Triage Notes (Signed)
Brought by ems from home for c/o URI symptoms with body aches and fever.  Reports  Trying to walk to the kitchen when she passed out in the floor.  Woke up with pain in right side of head and right hip.  Hx of bil hip replacements and lupus.

## 2019-01-10 NOTE — ED Notes (Signed)
Taken to radiology at this time. 
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
## 2019-01-11 MED ORDER — HYDROCODONE-ACETAMINOPHEN 5-325 MG PO TABS: 1.0000 | ORAL_TABLET | ORAL | 0 refills | Status: DC | PRN

## 2019-01-11 MED ORDER — ONDANSETRON HCL 4 MG/2ML IJ SOLN
4.0000 mg | Freq: Once | INTRAMUSCULAR | Status: AC
  Administered 2019-01-11: 4 mg via INTRAVENOUS
  Filled 2019-01-11: qty 2

## 2019-01-11 MED ORDER — ONDANSETRON HCL 4 MG PO TABS: 4.0000 mg | ORAL_TABLET | Freq: Four times a day (QID) | ORAL | 0 refills | Status: DC

## 2019-01-11 MED ORDER — PREDNISONE 10 MG PO TABS: ORAL_TABLET | ORAL | 0 refills | Status: DC

## 2019-01-11 NOTE — Discharge Instructions (Addendum)
Tests were all good.  Increase fluids.  Prescription for prednisone and pain medicine.  Follow-up your primary care doctor.

## 2019-02-27 ENCOUNTER — Emergency Department (HOSPITAL_COMMUNITY): Payer: Medicaid - Out of State

## 2019-02-27 ENCOUNTER — Emergency Department (HOSPITAL_COMMUNITY)
Admission: EM | Admit: 2019-02-27 | Discharge: 2019-02-28 | Disposition: A | Discharge status: Home / Self Care | Payer: Medicaid - Out of State | Attending: Emergency Medicine | Admitting: Emergency Medicine

## 2019-02-27 ENCOUNTER — Encounter (HOSPITAL_COMMUNITY): Payer: Self-pay

## 2019-02-27 DIAGNOSIS — R112 Nausea with vomiting, unspecified: Secondary | ICD-10-CM | POA: Insufficient documentation

## 2019-02-27 DIAGNOSIS — M3214 Glomerular disease in systemic lupus erythematosus: Secondary | ICD-10-CM | POA: Diagnosis not present

## 2019-02-27 DIAGNOSIS — J111 Influenza due to unidentified influenza virus with other respiratory manifestations: Secondary | ICD-10-CM | POA: Diagnosis not present

## 2019-02-27 DIAGNOSIS — R05 Cough: Secondary | ICD-10-CM | POA: Diagnosis not present

## 2019-02-27 DIAGNOSIS — F1290 Cannabis use, unspecified, uncomplicated: Secondary | ICD-10-CM | POA: Diagnosis not present

## 2019-02-27 DIAGNOSIS — J101 Influenza due to other identified influenza virus with other respiratory manifestations: Secondary | ICD-10-CM

## 2019-02-27 DIAGNOSIS — M7918 Myalgia, other site: Secondary | ICD-10-CM | POA: Diagnosis present

## 2019-02-27 DIAGNOSIS — F17210 Nicotine dependence, cigarettes, uncomplicated: Secondary | ICD-10-CM | POA: Insufficient documentation

## 2019-02-27 LAB — COMPREHENSIVE METABOLIC PANEL
ALT: 20 U/L (ref 0–44)
AST: 24 U/L (ref 15–41)
Albumin: 3.8 g/dL (ref 3.5–5.0)
Alkaline Phosphatase: 64 U/L (ref 38–126)
Anion gap: 8 (ref 5–15)
BILIRUBIN TOTAL: 0.9 mg/dL (ref 0.3–1.2)
BUN: 7 mg/dL (ref 6–20)
CO2: 26 mmol/L (ref 22–32)
Calcium: 9.1 mg/dL (ref 8.9–10.3)
Chloride: 100 mmol/L (ref 98–111)
Creatinine, Ser: 0.89 mg/dL (ref 0.44–1.00)
GFR calc Af Amer: >60 mL/min (ref >60)
GFR calc non Af Amer: >60 mL/min (ref >60)
Glucose, Bld: 106 mg/dL — ABNORMAL HIGH (ref 70–99)
POTASSIUM: 3.6 mmol/L (ref 3.5–5.1)
Sodium: 134 mmol/L — ABNORMAL LOW (ref 135–145)
TOTAL PROTEIN: 8.5 g/dL — AB (ref 6.5–8.1)

## 2019-02-27 LAB — I-STAT BETA HCG BLOOD, ED (MC, WL, AP ONLY): I-stat hCG, quantitative: <5 m[IU]/mL (ref <5)

## 2019-02-27 LAB — CBC
HCT: 37 % (ref 36.0–46.0)
Hemoglobin: 11.6 g/dL — ABNORMAL LOW (ref 12.0–15.0)
MCH: 28.9 pg (ref 26.0–34.0)
MCHC: 31.4 g/dL (ref 30.0–36.0)
MCV: 92.3 fL (ref 80.0–100.0)
Platelets: 306 10*3/uL (ref 150–400)
RBC: 4.01 MIL/uL (ref 3.87–5.11)
RDW: 13.2 % (ref 11.5–15.5)
WBC: 11.6 10*3/uL — ABNORMAL HIGH (ref 4.0–10.5)
nRBC: 0 % (ref 0.0–0.2)

## 2019-02-27 LAB — LACTIC ACID, PLASMA
Lactic Acid, Venous: 2 mmol/L (ref 0.5–1.9)
Lactic Acid, Venous: 1.7 mmol/L (ref 0.5–1.9)

## 2019-02-27 LAB — ACETAMINOPHEN LEVEL: Acetaminophen (Tylenol), Serum: 10 ug/mL (ref 10–30)

## 2019-02-27 LAB — LIPASE, BLOOD: Lipase: 23 U/L (ref 11–51)

## 2019-02-27 MED ORDER — LACTATED RINGERS IV BOLUS
1000.0000 mL | Freq: Once | INTRAVENOUS | Status: AC
  Administered 2019-02-27: 1000 mL via INTRAVENOUS

## 2019-02-27 MED ORDER — ONDANSETRON 4 MG PO TBDP
4.0000 mg | ORAL_TABLET | Freq: Once | ORAL | Status: AC | PRN
  Administered 2019-02-27: 4 mg via ORAL
  Filled 2019-02-27: qty 1

## 2019-02-27 MED ORDER — ACETAMINOPHEN 325 MG PO TABS
650.0000 mg | ORAL_TABLET | Freq: Once | ORAL | Status: AC
  Administered 2019-02-27: 650 mg via ORAL
  Filled 2019-02-27: qty 2

## 2019-02-27 MED ORDER — PROMETHAZINE HCL 25 MG/ML IJ SOLN
12.5000 mg | Freq: Once | INTRAMUSCULAR | Status: AC
  Administered 2019-02-27: 12.5 mg via INTRAVENOUS
  Filled 2019-02-27: qty 1

## 2019-02-27 MED ORDER — BENZONATATE 100 MG PO CAPS
100.0000 mg | ORAL_CAPSULE | Freq: Once | ORAL | Status: AC
  Administered 2019-02-27: 100 mg via ORAL
  Filled 2019-02-27: qty 1

## 2019-02-27 MED ORDER — ALBUTEROL SULFATE HFA 108 (90 BASE) MCG/ACT IN AERS
2.0000 | INHALATION_SPRAY | Freq: Once | RESPIRATORY_TRACT | Status: AC
  Administered 2019-02-27: 2 via RESPIRATORY_TRACT
  Filled 2019-02-27: qty 6.7

## 2019-02-27 MED ORDER — FENTANYL CITRATE (PF) 100 MCG/2ML IJ SOLN
50.0000 ug | Freq: Once | INTRAMUSCULAR | Status: AC
  Administered 2019-02-27: 50 ug via INTRAVENOUS
  Filled 2019-02-27: qty 2

## 2019-02-27 NOTE — ED Notes (Signed)
Lactic acid 2.0 called to this RN by Lab

## 2019-02-27 NOTE — ED Triage Notes (Signed)
Pt endorses generalized bodyaches with fever x 2 days, Then n/v and abd pain x 1 days. Pt has hx of lupus. Unable to keep anything down.

## 2019-02-28 LAB — INFLUENZA PANEL BY PCR (TYPE A & B)
Influenza A By PCR: POSITIVE — AB
Influenza B By PCR: NEGATIVE

## 2019-02-28 MED ORDER — OSELTAMIVIR PHOSPHATE 75 MG PO CAPS: 75.0000 mg | ORAL_CAPSULE | Freq: Two times a day (BID) | ORAL | 0 refills | Status: DC

## 2019-02-28 MED ORDER — ONDANSETRON HCL 4 MG PO TABS: 4.0000 mg | ORAL_TABLET | Freq: Four times a day (QID) | ORAL | 0 refills | Status: DC

## 2019-02-28 MED ORDER — OSELTAMIVIR PHOSPHATE 75 MG PO CAPS
75.0000 mg | ORAL_CAPSULE | Freq: Once | ORAL | Status: AC
  Administered 2019-02-28: 75 mg via ORAL
  Filled 2019-02-28: qty 1

## 2019-02-28 MED ORDER — OXYCODONE HCL 5 MG PO TABS: 5.0000 mg | ORAL_TABLET | Freq: Four times a day (QID) | ORAL | 0 refills | Status: DC | PRN
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
## 2019-02-28 NOTE — ED Notes (Signed)
Discussed discharge instructions and prescriptions with pt. Pt. Verbalized understanding. Pt. Discharged on wheel chair with family member.

## 2019-03-01 LAB — RESPIRATORY PANEL BY PCR
Adenovirus: NOT DETECTED
Bordetella pertussis: NOT DETECTED
Chlamydophila pneumoniae: NOT DETECTED
Coronavirus 229E: NOT DETECTED
Coronavirus HKU1: NOT DETECTED
Coronavirus NL63: NOT DETECTED
Coronavirus OC43: NOT DETECTED
INFLUENZA B-RVPPCR: NOT DETECTED
Influenza A H1 2009: DETECTED — AB
Metapneumovirus: NOT DETECTED
Mycoplasma pneumoniae: NOT DETECTED
Parainfluenza Virus 1: NOT DETECTED
Parainfluenza Virus 2: NOT DETECTED
Parainfluenza Virus 3: NOT DETECTED
Parainfluenza Virus 4: NOT DETECTED
RESPIRATORY SYNCYTIAL VIRUS-RVPPCR: NOT DETECTED
Rhinovirus / Enterovirus: NOT DETECTED

## 2019-06-03 ENCOUNTER — Other Ambulatory Visit: Payer: Self-pay

## 2019-06-03 ENCOUNTER — Encounter (HOSPITAL_COMMUNITY): Payer: Self-pay | Admitting: Emergency Medicine

## 2019-06-03 ENCOUNTER — Emergency Department (HOSPITAL_COMMUNITY)
Admission: EM | Admit: 2019-06-03 | Discharge: 2019-06-04 | Discharge status: Against Medical Advice | Payer: Medicaid - Out of State | Attending: Emergency Medicine | Admitting: Emergency Medicine

## 2019-06-03 DIAGNOSIS — M3210 Systemic lupus erythematosus, organ or system involvement unspecified: Secondary | ICD-10-CM | POA: Diagnosis present

## 2019-06-03 DIAGNOSIS — Z5321 Procedure and treatment not carried out due to patient leaving prior to being seen by health care provider: Secondary | ICD-10-CM | POA: Insufficient documentation

## 2019-06-03 LAB — COMPREHENSIVE METABOLIC PANEL
ALT: 12 U/L (ref 0–44)
AST: 19 U/L (ref 15–41)
Albumin: 4.1 g/dL (ref 3.5–5.0)
Alkaline Phosphatase: 68 U/L (ref 38–126)
Anion gap: 11 (ref 5–15)
BUN: 5 mg/dL — ABNORMAL LOW (ref 6–20)
CO2: 24 mmol/L (ref 22–32)
Calcium: 9.7 mg/dL (ref 8.9–10.3)
Chloride: 107 mmol/L (ref 98–111)
Creatinine, Ser: 0.91 mg/dL (ref 0.44–1.00)
GFR calc Af Amer: >60 mL/min (ref >60)
GFR calc non Af Amer: >60 mL/min (ref >60)
Glucose, Bld: 94 mg/dL (ref 70–99)
Potassium: 3.5 mmol/L (ref 3.5–5.1)
Sodium: 142 mmol/L (ref 135–145)
Total Bilirubin: 0.6 mg/dL (ref 0.3–1.2)
Total Protein: 8.5 g/dL — ABNORMAL HIGH (ref 6.5–8.1)

## 2019-06-03 LAB — CBC WITH DIFFERENTIAL/PLATELET
Abs Immature Granulocytes: 0.03 10*3/uL (ref 0.00–0.07)
Basophils Absolute: 0.1 10*3/uL (ref 0.0–0.1)
Basophils Relative: 1 %
Eosinophils Absolute: 0.1 10*3/uL (ref 0.0–0.5)
Eosinophils Relative: 2 %
HCT: 39.5 % (ref 36.0–46.0)
Hemoglobin: 12.3 g/dL (ref 12.0–15.0)
Immature Granulocytes: 0 %
Lymphocytes Relative: 24 %
Lymphs Abs: 1.8 10*3/uL (ref 0.7–4.0)
MCH: 28.7 pg (ref 26.0–34.0)
MCHC: 31.1 g/dL (ref 30.0–36.0)
MCV: 92.1 fL (ref 80.0–100.0)
Monocytes Absolute: 0.4 10*3/uL (ref 0.1–1.0)
Monocytes Relative: 6 %
Neutro Abs: 5.2 10*3/uL (ref 1.7–7.7)
Neutrophils Relative %: 67 %
Platelets: 326 10*3/uL (ref 150–400)
RBC: 4.29 MIL/uL (ref 3.87–5.11)
RDW: 12.4 % (ref 11.5–15.5)
WBC: 7.6 10*3/uL (ref 4.0–10.5)
nRBC: 0 % (ref 0.0–0.2)
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
## 2019-06-03 NOTE — ED Triage Notes (Signed)
Pt c/o generalized body aches and joint swelling x 1 day. Hx Lupus.
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
## 2019-08-05 ENCOUNTER — Other Ambulatory Visit: Payer: Self-pay

## 2019-08-05 ENCOUNTER — Inpatient Hospital Stay (HOSPITAL_COMMUNITY)
Admission: AD | Admit: 2019-08-05 | Discharge: 2019-08-05 | Disposition: A | Discharge status: Home / Self Care | Payer: Medicaid - Out of State | Attending: Obstetrics and Gynecology | Admitting: Obstetrics and Gynecology

## 2019-08-05 DIAGNOSIS — Z3202 Encounter for pregnancy test, result negative: Secondary | ICD-10-CM | POA: Diagnosis not present

## 2019-08-05 DIAGNOSIS — R109 Unspecified abdominal pain: Secondary | ICD-10-CM | POA: Diagnosis not present

## 2019-08-05 LAB — POCT PREGNANCY, URINE: Preg Test, Ur: NEGATIVE

## 2019-08-05 LAB — HCG, QUANTITATIVE, PREGNANCY: hCG, Beta Chain, Quant, S: 1 m[IU]/mL (ref <5)

## 2019-08-05 NOTE — MAU Note (Signed)
.   Evelyn [name] is a 38 y.o. at Unknown here in MAU reporting: lower abdominal pressure. States she had one positive pregnancy test at home and one negative  LMP: unsure Onset of complaint: today Pain score: 5 Vitals:   08/05/19 1424  BP: 104/72  Pulse: 89  Resp: 16  Temp: 98.2 F (36.8 C)  SpO2: 99%     Lab orders placed from triage: UPT

## 2019-08-05 NOTE — MAU Provider Note (Signed)
First Provider Initiated Contact with Patient 08/05/19 1628      S Ms. Evelyn [name] is a 38 y.o. [phone] non-pregnant female who presents to MAU today with complaint of abdominal pressure and one positive pregnancy test at home.  O BP 104/72   Pulse 89   Temp 98.2 F (36.8 C)   Resp 16   SpO2 99%  Physical Exam  Constitutional: She is oriented to person, place, and time. She appears well-developed and well-nourished. No distress.  HENT:  Head: Normocephalic and atraumatic.  Eyes: EOM are normal.  Neck: Normal range of motion.  Cardiovascular: Normal rate.  Respiratory: Effort normal.  Musculoskeletal: Normal range of motion.  Neurological: She is alert and oriented to person, place, and time.  Skin: She is not diaphoretic.  Psychiatric: She has a normal mood and affect. Judgment normal.    A Non pregnant female Medical screening exam complete  P HCG drawn and negative Discharge from MAU in stable condition Patient given the option of transfer to [facility] for further evaluation or seek care in outpatient facility of choice List of options for follow-up given  Warning signs for worsening condition that would warrant emergency follow-up discussed Patient may return to MAU as needed for pregnancy related complaints  Lucilla Petrenko, Marin Shutter, MD 08/05/2019 4:36 PM

## 2019-08-31 ENCOUNTER — Encounter (HOSPITAL_COMMUNITY): Payer: Self-pay | Admitting: Emergency Medicine

## 2019-08-31 ENCOUNTER — Other Ambulatory Visit: Payer: Self-pay

## 2019-08-31 ENCOUNTER — Emergency Department (HOSPITAL_COMMUNITY)
Admission: EM | Admit: 2019-08-31 | Discharge: 2019-08-31 | Disposition: A | Discharge status: Home / Self Care | Payer: Medicaid - Out of State | Attending: Emergency Medicine | Admitting: Emergency Medicine

## 2019-08-31 DIAGNOSIS — R2241 Localized swelling, mass and lump, right lower limb: Secondary | ICD-10-CM | POA: Insufficient documentation

## 2019-08-31 DIAGNOSIS — F17210 Nicotine dependence, cigarettes, uncomplicated: Secondary | ICD-10-CM | POA: Insufficient documentation

## 2019-08-31 DIAGNOSIS — R2232 Localized swelling, mass and lump, left upper limb: Secondary | ICD-10-CM | POA: Insufficient documentation

## 2019-08-31 DIAGNOSIS — R21 Rash and other nonspecific skin eruption: Secondary | ICD-10-CM | POA: Insufficient documentation

## 2019-08-31 DIAGNOSIS — F1210 Cannabis abuse, uncomplicated: Secondary | ICD-10-CM | POA: Insufficient documentation

## 2019-08-31 DIAGNOSIS — R2231 Localized swelling, mass and lump, right upper limb: Secondary | ICD-10-CM | POA: Diagnosis not present

## 2019-08-31 DIAGNOSIS — R2242 Localized swelling, mass and lump, left lower limb: Secondary | ICD-10-CM | POA: Insufficient documentation

## 2019-08-31 LAB — COMPREHENSIVE METABOLIC PANEL
ALT: 14 U/L (ref 0–44)
AST: 21 U/L (ref 15–41)
Albumin: 3.8 g/dL (ref 3.5–5.0)
Alkaline Phosphatase: 67 U/L (ref 38–126)
Anion gap: 9 (ref 5–15)
BUN: 5 mg/dL — ABNORMAL LOW (ref 6–20)
CO2: 23 mmol/L (ref 22–32)
Calcium: 9.2 mg/dL (ref 8.9–10.3)
Chloride: 107 mmol/L (ref 98–111)
Creatinine, Ser: 0.71 mg/dL (ref 0.44–1.00)
GFR calc Af Amer: >60 mL/min (ref >60)
GFR calc non Af Amer: >60 mL/min (ref >60)
Glucose, Bld: 81 mg/dL (ref 70–99)
Potassium: 3.6 mmol/L (ref 3.5–5.1)
Sodium: 139 mmol/L (ref 135–145)
Total Bilirubin: 0.4 mg/dL (ref 0.3–1.2)
Total Protein: 8.1 g/dL (ref 6.5–8.1)

## 2019-08-31 LAB — CBC WITH DIFFERENTIAL/PLATELET
Abs Immature Granulocytes: 0 10*3/uL (ref 0.00–0.07)
Basophils Absolute: 0.1 10*3/uL (ref 0.0–0.1)
Basophils Relative: 2 %
Eosinophils Absolute: 0.1 10*3/uL (ref 0.0–0.5)
Eosinophils Relative: 3 %
HCT: 37.7 % (ref 36.0–46.0)
Hemoglobin: 11.7 g/dL — ABNORMAL LOW (ref 12.0–15.0)
Lymphocytes Relative: 35 %
Lymphs Abs: 1.6 10*3/uL (ref 0.7–4.0)
MCH: 28.7 pg (ref 26.0–34.0)
MCHC: 31 g/dL (ref 30.0–36.0)
MCV: 92.6 fL (ref 80.0–100.0)
Monocytes Absolute: 0.2 10*3/uL (ref 0.1–1.0)
Monocytes Relative: 5 %
Neutro Abs: 2.5 10*3/uL (ref 1.7–7.7)
Neutrophils Relative %: 55 %
Platelets: 211 10*3/uL (ref 150–400)
RBC: 4.07 MIL/uL (ref 3.87–5.11)
RDW: 12.9 % (ref 11.5–15.5)
WBC: 4.5 10*3/uL (ref 4.0–10.5)
nRBC: 0 % (ref 0.0–0.2)
nRBC: 0 /100 WBC

## 2019-08-31 MED ORDER — PREDNISONE 20 MG PO TABS
60.0000 mg | ORAL_TABLET | Freq: Once | ORAL | Status: AC
  Administered 2019-08-31: 60 mg via ORAL
  Filled 2019-08-31: qty 3

## 2019-08-31 MED ORDER — HYDROXYCHLOROQUINE SULFATE 200 MG PO TABS: 200.0000 mg | ORAL_TABLET | Freq: Every day | ORAL | 0 refills | Status: DC

## 2019-08-31 MED ORDER — HYDROXYZINE HCL 25 MG PO TABS
25.0000 mg | ORAL_TABLET | Freq: Once | ORAL | Status: AC
  Administered 2019-08-31: 15:00:00 25 mg via ORAL
  Filled 2019-08-31: qty 1

## 2019-08-31 MED ORDER — HYDROXYZINE HCL 25 MG PO TABS: 25.0000 mg | ORAL_TABLET | Freq: Three times a day (TID) | ORAL | 0 refills | Status: DC | PRN

## 2019-08-31 MED ORDER — PREDNISONE 10 MG PO TABS: ORAL_TABLET | ORAL | 0 refills | Status: DC

## 2019-08-31 MED ORDER — HYDROCODONE-ACETAMINOPHEN 5-325 MG PO TABS
1.0000 | ORAL_TABLET | Freq: Once | ORAL | Status: AC
  Administered 2019-08-31: 15:00:00 1 via ORAL
  Filled 2019-08-31: qty 1
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
## 2019-08-31 NOTE — Discharge Instructions (Signed)
It was my pleasure taking care of you today!   Call your rheumatologist or primary care doctor in [place] to try to get a follow-up appointment this week.  Take prednisone as directed.  I have also restarted you on your Plaquenil.  Atarax as needed for itching.  Return to ER for new or worsening symptoms, any additional concerns.

## 2019-08-31 NOTE — ED Triage Notes (Signed)
Pt reports rash to body that itches and burns, she reports some of the sores are draining.She has scattered blisters to arms, back, and between legs.  She reports she gets shingles every year.

## 2019-11-17 ENCOUNTER — Encounter (HOSPITAL_COMMUNITY): Payer: Self-pay

## 2019-11-17 ENCOUNTER — Other Ambulatory Visit: Payer: Self-pay

## 2019-11-17 ENCOUNTER — Emergency Department (HOSPITAL_COMMUNITY)
Admission: EM | Admit: 2019-11-17 | Discharge: 2019-11-18 | Disposition: A | Discharge status: Home / Self Care | Payer: Medicaid - Out of State | Attending: Emergency Medicine | Admitting: Emergency Medicine

## 2019-11-17 ENCOUNTER — Emergency Department (HOSPITAL_COMMUNITY): Payer: Medicaid - Out of State

## 2019-11-17 DIAGNOSIS — M25551 Pain in right hip: Secondary | ICD-10-CM | POA: Diagnosis not present

## 2019-11-17 DIAGNOSIS — Z79899 Other long term (current) drug therapy: Secondary | ICD-10-CM | POA: Insufficient documentation

## 2019-11-17 DIAGNOSIS — R52 Pain, unspecified: Secondary | ICD-10-CM

## 2019-11-17 DIAGNOSIS — F1210 Cannabis abuse, uncomplicated: Secondary | ICD-10-CM | POA: Diagnosis not present

## 2019-11-17 DIAGNOSIS — F17210 Nicotine dependence, cigarettes, uncomplicated: Secondary | ICD-10-CM | POA: Diagnosis not present

## 2019-11-17 DIAGNOSIS — R519 Headache, unspecified: Secondary | ICD-10-CM | POA: Diagnosis not present

## 2019-11-17 DIAGNOSIS — M791 Myalgia, unspecified site: Secondary | ICD-10-CM | POA: Insufficient documentation

## 2019-11-17 DIAGNOSIS — M25559 Pain in unspecified hip: Secondary | ICD-10-CM

## 2019-11-17 LAB — CBC WITH DIFFERENTIAL/PLATELET
Abs Immature Granulocytes: 0.02 10*3/uL (ref 0.00–0.07)
Basophils Absolute: 0 10*3/uL (ref 0.0–0.1)
Basophils Relative: 0 %
Eosinophils Absolute: 0 10*3/uL (ref 0.0–0.5)
Eosinophils Relative: 0 %
HCT: 36.4 % (ref 36.0–46.0)
Hemoglobin: 11.6 g/dL — ABNORMAL LOW (ref 12.0–15.0)
Immature Granulocytes: 0 %
Lymphocytes Relative: 28 %
Lymphs Abs: 2.1 10*3/uL (ref 0.7–4.0)
MCH: 29.4 pg (ref 26.0–34.0)
MCHC: 31.9 g/dL (ref 30.0–36.0)
MCV: 92.4 fL (ref 80.0–100.0)
Monocytes Absolute: 0.5 10*3/uL (ref 0.1–1.0)
Monocytes Relative: 6 %
Neutro Abs: 4.9 10*3/uL (ref 1.7–7.7)
Neutrophils Relative %: 66 %
Platelets: 291 10*3/uL (ref 150–400)
RBC: 3.94 MIL/uL (ref 3.87–5.11)
RDW: 13.4 % (ref 11.5–15.5)
WBC: 7.5 10*3/uL (ref 4.0–10.5)
nRBC: 0 % (ref 0.0–0.2)

## 2019-11-17 LAB — BASIC METABOLIC PANEL
Anion gap: 8 (ref 5–15)
BUN: 9 mg/dL (ref 6–20)
CO2: 25 mmol/L (ref 22–32)
Calcium: 9.5 mg/dL (ref 8.9–10.3)
Chloride: 107 mmol/L (ref 98–111)
Creatinine, Ser: 0.67 mg/dL (ref 0.44–1.00)
GFR calc Af Amer: >60 mL/min (ref >60)
GFR calc non Af Amer: >60 mL/min (ref >60)
Glucose, Bld: 108 mg/dL — ABNORMAL HIGH (ref 70–99)
Potassium: 4 mmol/L (ref 3.5–5.1)
Sodium: 140 mmol/L (ref 135–145)

## 2019-11-17 LAB — I-STAT BETA HCG BLOOD, ED (MC, WL, AP ONLY): I-stat hCG, quantitative: <5 m[IU]/mL (ref <5)

## 2019-11-17 MED ORDER — FENTANYL CITRATE (PF) 100 MCG/2ML IJ SOLN
100.0000 ug | Freq: Once | INTRAMUSCULAR | Status: AC
  Administered 2019-11-17: 100 ug via INTRAVENOUS
  Filled 2019-11-17: qty 2

## 2019-11-17 MED ORDER — HYDROCODONE-ACETAMINOPHEN 5-325 MG PO TABS
1.0000 | ORAL_TABLET | Freq: Once | ORAL | Status: AC
  Administered 2019-11-18: 1 via ORAL
  Filled 2019-11-17: qty 1

## 2019-11-17 MED ORDER — ONDANSETRON HCL 4 MG/2ML IJ SOLN
4.0000 mg | Freq: Once | INTRAMUSCULAR | Status: AC
  Administered 2019-11-17: 4 mg via INTRAVENOUS
  Filled 2019-11-17: qty 2

## 2019-11-17 MED ORDER — MORPHINE SULFATE (PF) 4 MG/ML IV SOLN
4.0000 mg | Freq: Once | INTRAVENOUS | Status: AC
  Administered 2019-11-17: 4 mg via INTRAVENOUS
  Filled 2019-11-17: qty 1

## 2019-11-17 NOTE — ED Notes (Signed)
Patient transported to X-ray 
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
## 2019-11-17 NOTE — ED Triage Notes (Signed)
Patient complains of ongoing generalized body pain and was seen by her MD on Thursday and started on steroids. Patient said she also has headache with same. No relief with the steroids.

## 2019-11-17 NOTE — Discharge Instructions (Signed)
As we discussed, your hip x-ray did not show any acute broken bones.  This is most likely musculoskeletal pain.  Continue to t the medications prescribed by rheumatology doctor.  I provided you with rheumatology referral here in [place].  Return to emergency department for any fever, chest pain, difficulty breathing, any worsening pain or any other worsening or concerning symptoms.

## 2019-11-18 NOTE — ED Notes (Signed)
Patient verbalizes understanding of discharge instructions. Opportunity for questioning and answers were provided. Armband removed by staff, pt discharged from ED.  

## 2019-11-21 ENCOUNTER — Emergency Department (HOSPITAL_COMMUNITY)
Admission: EM | Admit: 2019-11-21 | Discharge: 2019-11-22 | Disposition: A | Discharge status: Home / Self Care | Payer: Medicaid - Out of State | Attending: Emergency Medicine | Admitting: Emergency Medicine

## 2019-11-21 ENCOUNTER — Encounter (HOSPITAL_COMMUNITY): Payer: Self-pay | Admitting: Emergency Medicine

## 2019-11-21 ENCOUNTER — Other Ambulatory Visit: Payer: Self-pay

## 2019-11-21 DIAGNOSIS — Z20828 Contact with and (suspected) exposure to other viral communicable diseases: Secondary | ICD-10-CM | POA: Insufficient documentation

## 2019-11-21 DIAGNOSIS — F17210 Nicotine dependence, cigarettes, uncomplicated: Secondary | ICD-10-CM | POA: Diagnosis not present

## 2019-11-21 DIAGNOSIS — J209 Acute bronchitis, unspecified: Secondary | ICD-10-CM

## 2019-11-21 DIAGNOSIS — J029 Acute pharyngitis, unspecified: Secondary | ICD-10-CM | POA: Diagnosis present

## 2019-11-21 DIAGNOSIS — Z79899 Other long term (current) drug therapy: Secondary | ICD-10-CM | POA: Insufficient documentation

## 2019-11-21 DIAGNOSIS — F1210 Cannabis abuse, uncomplicated: Secondary | ICD-10-CM | POA: Insufficient documentation

## 2019-11-21 NOTE — ED Triage Notes (Signed)
Pt arriving POV with complaint of flu like symptoms. Pt ws seen at [facility] 11/17/19 for same. Pt reports body aches, sore throat, and increased weakness.
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
## 2019-11-22 ENCOUNTER — Emergency Department (HOSPITAL_COMMUNITY): Payer: Medicaid - Out of State

## 2019-11-22 LAB — SARS CORONAVIRUS 2 (TAT 6-24 HRS): SARS Coronavirus 2: NEGATIVE

## 2019-11-22 MED ORDER — AZITHROMYCIN 250 MG PO TABS: ORAL_TABLET | ORAL | 0 refills | Status: DC

## 2019-11-22 NOTE — Discharge Instructions (Addendum)
Begin taking Zithromax as prescribed.  Continue over-the-counter medications as needed for symptom relief.  Isolate yourself at home until the results of your Covid test are known.        Infection Prevention Recommendations for Individuals Confirmed to have, or Being Evaluated for, 2019 Novel Coronavirus (COVID-19) Infection Who Receive Care at Home  Individuals who are confirmed to have, or are being evaluated for, COVID-19 should follow the prevention steps below until a healthcare provider or local or state health department says they can return to normal activities.  Stay home except to get medical care You should restrict activities outside your home, except for getting medical care. Do not go to work, school, or public areas, and do not use public transportation or taxis.  Call ahead before visiting your doctor Before your medical appointment, call the healthcare provider and tell them that you have, or are being evaluated for, COVID-19 infection. This will help the healthcare providers office take steps to keep other people from getting infected. Ask your healthcare provider to call the local or state health department.  Monitor your symptoms Seek prompt medical attention if your illness is worsening (e.g., difficulty breathing). Before going to your medical appointment, call the healthcare provider and tell them that you have, or are being evaluated for, COVID-19 infection. Ask your healthcare provider to call the local or state health department.  Wear a facemask You should wear a facemask that covers your nose and mouth when you are in the same room with other people and when you visit a healthcare provider. People who live with or visit you should also wear a facemask while they are in the same room with you.  Separate yourself from other people in your home As much as possible, you should stay in a different room from other people in your home. Also, you should  use a separate bathroom, if available.  Avoid sharing household items You should not share dishes, drinking glasses, cups, eating utensils, towels, bedding, or other items with other people in your home. After using these items, you should wash them thoroughly with soap and water.  Cover your coughs and sneezes Cover your mouth and nose with a tissue when you cough or sneeze, or you can cough or sneeze into your sleeve. Throw used tissues in a lined trash can, and immediately wash your hands with soap and water for at least 20 seconds or use an alcohol-based hand rub.  Wash your [facility] your hands often and thoroughly with soap and water for at least 20 seconds. You can use an alcohol-based hand sanitizer if soap and water are not available and if your hands are not visibly dirty. Avoid touching your eyes, nose, and mouth with unwashed hands.   Prevention Steps for Caregivers and Household Members of Individuals Confirmed to have, or Being Evaluated for, COVID-19 Infection Being Cared for in the Home  If you live with, or provide care at home for, a person confirmed to have, or being evaluated for, COVID-19 infection please follow these guidelines to prevent infection:  Follow healthcare providers instructions Make sure that you understand and can help the patient follow any healthcare provider instructions for all care.  Provide for the patients basic needs You should help the patient with basic needs in the home and provide support for getting groceries, prescriptions, and other personal needs.  Monitor the patients symptoms If they are getting sicker, call his or her medical provider and tell them that the  patient has, or is being evaluated for, COVID-19 infection. This will help the healthcare providers office take steps to keep other people from getting infected. Ask the healthcare provider to call the local or state health department.  Limit the number of people who  have contact with the patient If possible, have only one caregiver for the patient. Other household members should stay in another home or place of residence. If this is not possible, they should stay in another room, or be separated from the patient as much as possible. Use a separate bathroom, if available. Restrict visitors who do not have an essential need to be in the home.  Keep older adults, very young children, and other sick people away from the patient Keep older adults, very young children, and those who have compromised immune systems or chronic health conditions away from the patient. This includes people with chronic heart, lung, or kidney conditions, diabetes, and cancer.  Ensure good ventilation Make sure that shared spaces in the home have good air flow, such as from an air conditioner or an opened window, weather permitting.  Wash your hands often Wash your hands often and thoroughly with soap and water for at least 20 seconds. You can use an alcohol based hand sanitizer if soap and water are not available and if your hands are not visibly dirty. Avoid touching your eyes, nose, and mouth with unwashed hands. Use disposable paper towels to dry your hands. If not available, use dedicated cloth towels and replace them when they become wet.  Wear a facemask and gloves Wear a disposable facemask at all times in the room and gloves when you touch or have contact with the patients blood, body fluids, and/or secretions or excretions, such as sweat, saliva, sputum, nasal mucus, vomit, urine, or feces.  Ensure the mask fits over your nose and mouth tightly, and do not touch it during use. Throw out disposable facemasks and gloves after using them. Do not reuse. Wash your hands immediately after removing your facemask and gloves. If your personal clothing becomes contaminated, carefully remove clothing and launder. Wash your hands after handling contaminated clothing. Place all used  disposable facemasks, gloves, and other waste in a lined container before disposing them with other household waste. Remove gloves and wash your hands immediately after handling these items.  Do not share dishes, glasses, or other household items with the patient Avoid sharing household items. You should not share dishes, drinking glasses, cups, eating utensils, towels, bedding, or other items with a patient who is confirmed to have, or being evaluated for, COVID-19 infection. After the person uses these items, you should wash them thoroughly with soap and water.  Wash laundry thoroughly Immediately remove and wash clothes or bedding that have blood, body fluids, and/or secretions or excretions, such as sweat, saliva, sputum, nasal mucus, vomit, urine, or feces, on them. Wear gloves when handling laundry from the patient. Read and follow directions on labels of laundry or clothing items and detergent. In general, wash and dry with the warmest temperatures recommended on the label.  Clean all areas the individual has used often Clean all touchable surfaces, such as counters, tabletops, doorknobs, bathroom fixtures, toilets, phones, keyboards, tablets, and bedside tables, every day. Also, clean any surfaces that may have blood, body fluids, and/or secretions or excretions on them. Wear gloves when cleaning surfaces the patient has come in contact with. Use a diluted bleach solution (e.g., dilute bleach with 1 part bleach and 10 parts water)  or a household disinfectant with a label that says EPA-registered for coronaviruses. To make a bleach solution at home, add 1 tablespoon of bleach to 1 quart (4 cups) of water. For a larger supply, add  cup of bleach to 1 gallon (16 cups) of water. Read labels of cleaning products and follow recommendations provided on product labels. Labels contain instructions for safe and effective use of the cleaning product including precautions you should take when applying  the product, such as wearing gloves or eye protection and making sure you have good ventilation during use of the product. Remove gloves and wash hands immediately after cleaning.  Monitor yourself for signs and symptoms of illness Caregivers and household members are considered close contacts, should monitor their health, and will be asked to limit movement outside of the home to the extent possible. Follow the monitoring steps for close contacts listed on the symptom monitoring form.   ? If you have additional questions, contact your local health department or call the epidemiologist on call at [phone] (available 24/7). ? This guidance is subject to change. For the most up-to-date guidance from [facility], please refer to their website: [email or web address]

## 2019-11-23 ENCOUNTER — Telehealth: Payer: Self-pay | Admitting: General Practice

## 2019-11-23 NOTE — Telephone Encounter (Signed)
Pt called In for covid result   Advised of NEGATIVE result

## 2019-12-08 ENCOUNTER — Ambulatory Visit: Payer: Medicaid - Out of State

## 2019-12-22 ENCOUNTER — Encounter (HOSPITAL_COMMUNITY): Payer: Self-pay | Admitting: Emergency Medicine

## 2019-12-22 ENCOUNTER — Other Ambulatory Visit: Payer: Self-pay

## 2019-12-22 ENCOUNTER — Emergency Department (HOSPITAL_COMMUNITY)
Admission: EM | Admit: 2019-12-22 | Discharge: 2019-12-23 | Disposition: A | Discharge status: Home / Self Care | Payer: Medicaid - Out of State | Attending: Emergency Medicine | Admitting: Emergency Medicine

## 2019-12-22 DIAGNOSIS — A5901 Trichomonal vulvovaginitis: Secondary | ICD-10-CM | POA: Diagnosis not present

## 2019-12-22 DIAGNOSIS — K5900 Constipation, unspecified: Secondary | ICD-10-CM | POA: Diagnosis not present

## 2019-12-22 DIAGNOSIS — M3210 Systemic lupus erythematosus, organ or system involvement unspecified: Secondary | ICD-10-CM | POA: Diagnosis not present

## 2019-12-22 DIAGNOSIS — F17210 Nicotine dependence, cigarettes, uncomplicated: Secondary | ICD-10-CM | POA: Insufficient documentation

## 2019-12-22 DIAGNOSIS — M069 Rheumatoid arthritis, unspecified: Secondary | ICD-10-CM | POA: Insufficient documentation

## 2019-12-22 DIAGNOSIS — R1030 Lower abdominal pain, unspecified: Secondary | ICD-10-CM | POA: Insufficient documentation

## 2019-12-22 DIAGNOSIS — R109 Unspecified abdominal pain: Secondary | ICD-10-CM

## 2019-12-22 DIAGNOSIS — N739 Female pelvic inflammatory disease, unspecified: Secondary | ICD-10-CM | POA: Insufficient documentation

## 2019-12-22 DIAGNOSIS — F1290 Cannabis use, unspecified, uncomplicated: Secondary | ICD-10-CM | POA: Diagnosis not present

## 2019-12-22 DIAGNOSIS — N730 Acute parametritis and pelvic cellulitis: Secondary | ICD-10-CM

## 2019-12-22 DIAGNOSIS — R102 Pelvic and perineal pain: Secondary | ICD-10-CM

## 2019-12-22 MED ORDER — SODIUM CHLORIDE 0.9% FLUSH: 3.0000 mL | Freq: Once | INTRAVENOUS | Status: DC

## 2019-12-22 NOTE — ED Triage Notes (Signed)
Patient reports LLQ pain onset this morning with emesis , denies diarrhea or fever .

## 2019-12-23 ENCOUNTER — Emergency Department (HOSPITAL_COMMUNITY): Payer: Medicaid - Out of State

## 2019-12-23 LAB — COMPREHENSIVE METABOLIC PANEL
ALT: 18 U/L (ref 0–44)
AST: 19 U/L (ref 15–41)
Albumin: 3.9 g/dL (ref 3.5–5.0)
Alkaline Phosphatase: 72 U/L (ref 38–126)
Anion gap: 9 (ref 5–15)
BUN: 9 mg/dL (ref 6–20)
CO2: 28 mmol/L (ref 22–32)
Calcium: 9.5 mg/dL (ref 8.9–10.3)
Chloride: 100 mmol/L (ref 98–111)
Creatinine, Ser: 0.83 mg/dL (ref 0.44–1.00)
GFR calc Af Amer: >60 mL/min (ref >60)
GFR calc non Af Amer: >60 mL/min (ref >60)
Glucose, Bld: 160 mg/dL — ABNORMAL HIGH (ref 70–99)
Potassium: 4 mmol/L (ref 3.5–5.1)
Sodium: 137 mmol/L (ref 135–145)
Total Bilirubin: 0.5 mg/dL (ref 0.3–1.2)
Total Protein: 8 g/dL (ref 6.5–8.1)

## 2019-12-23 LAB — CBC
HCT: 38.8 % (ref 36.0–46.0)
Hemoglobin: 12.2 g/dL (ref 12.0–15.0)
MCH: 29.5 pg (ref 26.0–34.0)
MCHC: 31.4 g/dL (ref 30.0–36.0)
MCV: 93.9 fL (ref 80.0–100.0)
Platelets: 320 10*3/uL (ref 150–400)
RBC: 4.13 MIL/uL (ref 3.87–5.11)
RDW: 13 % (ref 11.5–15.5)
WBC: 9.7 10*3/uL (ref 4.0–10.5)
nRBC: 0 % (ref 0.0–0.2)

## 2019-12-23 LAB — WET PREP, GENITAL
Clue Cells Wet Prep HPF POC: NONE SEEN
Sperm: NONE SEEN
Yeast Wet Prep HPF POC: NONE SEEN

## 2019-12-23 LAB — LIPASE, BLOOD: Lipase: 31 U/L (ref 11–51)

## 2019-12-23 LAB — URINALYSIS, ROUTINE W REFLEX MICROSCOPIC
Bilirubin Urine: NEGATIVE
Glucose, UA: NEGATIVE mg/dL
Hgb urine dipstick: NEGATIVE
Ketones, ur: NEGATIVE mg/dL
Nitrite: NEGATIVE
Protein, ur: 30 mg/dL — AB
Specific Gravity, Urine: 1.015 (ref 1.005–1.030)
pH: 7 (ref 5.0–8.0)

## 2019-12-23 LAB — I-STAT BETA HCG BLOOD, ED (MC, WL, AP ONLY): I-stat hCG, quantitative: <5 m[IU]/mL (ref <5)

## 2019-12-23 MED ORDER — METRONIDAZOLE 500 MG PO TABS
500.0000 mg | ORAL_TABLET | Freq: Once | ORAL | Status: AC
  Administered 2019-12-23: 500 mg via ORAL
  Filled 2019-12-23: qty 1

## 2019-12-23 MED ORDER — CEFTRIAXONE SODIUM 250 MG IJ SOLR
250.0000 mg | Freq: Once | INTRAMUSCULAR | Status: AC
  Administered 2019-12-23: 250 mg via INTRAMUSCULAR
  Filled 2019-12-23: qty 250

## 2019-12-23 MED ORDER — LIDOCAINE HCL (PF) 1 % IJ SOLN
INTRAMUSCULAR | Status: AC
  Filled 2019-12-23: qty 5

## 2019-12-23 MED ORDER — DOXYCYCLINE HYCLATE 100 MG PO CAPS: 100.0000 mg | ORAL_CAPSULE | Freq: Two times a day (BID) | ORAL | 0 refills | Status: AC

## 2019-12-23 MED ORDER — DOXYCYCLINE HYCLATE 100 MG PO TABS
100.0000 mg | ORAL_TABLET | Freq: Once | ORAL | Status: AC
  Administered 2019-12-23: 100 mg via ORAL
  Filled 2019-12-23: qty 1

## 2019-12-23 MED ORDER — ACETAMINOPHEN 500 MG PO TABS
1000.0000 mg | ORAL_TABLET | Freq: Once | ORAL | Status: AC
  Administered 2019-12-23: 1000 mg via ORAL
  Filled 2019-12-23: qty 2

## 2019-12-23 MED ORDER — METRONIDAZOLE 500 MG PO TABS: 500.0000 mg | ORAL_TABLET | Freq: Two times a day (BID) | ORAL | 0 refills | Status: AC

## 2019-12-23 MED ORDER — DICYCLOMINE HCL 10 MG PO CAPS
10.0000 mg | ORAL_CAPSULE | Freq: Once | ORAL | Status: AC
  Administered 2019-12-23: 10 mg via ORAL
  Filled 2019-12-23: qty 1

## 2019-12-23 NOTE — ED Notes (Signed)
Patient transported to [facility] 
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
## 2019-12-23 NOTE — Discharge Instructions (Signed)
Your work up today was positive for trichomonas which is an STD. Take Flagyl and doxycycline as prescribed for treatment of trichomonas and pelvic inflammatory disease. Continue 1000mg  tylenol every 6-8 hours for pain control. Follow-up with the health department in 48 hours for the results of your pending Gonorrhea and Chlamydia tests. Notify all sexual partners of their need to be tested and treated as well. Do not engage in sexual intercourse for 3 weeks, until you have completed both your antibiotics for at least 7 days. Use a condom when sexually active.

## 2019-12-24 LAB — GC/CHLAMYDIA PROBE AMP (~~LOC~~) NOT AT ARMC
Chlamydia: NEGATIVE
Neisseria Gonorrhea: NEGATIVE

## 2020-01-13 ENCOUNTER — Other Ambulatory Visit: Payer: Self-pay

## 2020-01-13 ENCOUNTER — Ambulatory Visit: Payer: Self-pay | Attending: Family Medicine | Admitting: Physician Assistant

## 2020-01-13 DIAGNOSIS — M329 Systemic lupus erythematosus, unspecified: Secondary | ICD-10-CM

## 2020-01-13 DIAGNOSIS — Z1389 Encounter for screening for other disorder: Secondary | ICD-10-CM

## 2020-01-13 DIAGNOSIS — Z09 Encounter for follow-up examination after completed treatment for conditions other than malignant neoplasm: Secondary | ICD-10-CM

## 2020-01-13 DIAGNOSIS — IMO0002 Reserved for concepts with insufficient information to code with codable children: Secondary | ICD-10-CM

## 2020-01-13 DIAGNOSIS — Z8619 Personal history of other infectious and parasitic diseases: Secondary | ICD-10-CM

## 2020-01-13 DIAGNOSIS — R739 Hyperglycemia, unspecified: Secondary | ICD-10-CM

## 2020-01-13 LAB — POCT URINALYSIS DIP (CLINITEK)
Bilirubin, UA: NEGATIVE
Glucose, UA: NEGATIVE mg/dL
Ketones, POC UA: NEGATIVE mg/dL
Nitrite, UA: NEGATIVE
POC PROTEIN,UA: NEGATIVE
Spec Grav, UA: 1.025 (ref 1.010–1.025)
Urobilinogen, UA: 0.2 E.U./dL
pH, UA: 6.5 (ref 5.0–8.0)

## 2020-01-13 MED ORDER — HYDROXYCHLOROQUINE SULFATE 200 MG PO TABS: 200.0000 mg | ORAL_TABLET | Freq: Every day | ORAL | 2 refills | Status: DC

## 2020-01-13 NOTE — Progress Notes (Signed)
Virtual Visit via Telephone Note  I connected with [name] on 01/13/20 at 10:50 AM EST by telephone and verified that I am speaking with the correct person using two identifiers.   I discussed the limitations, risks, security and privacy concerns of performing an evaluation and management service by telephone and the availability of in person appointments. I also discussed with the patient that there may be a patient responsible charge related to this service. The patient expressed understanding and agreed to proceed.  PATIENT visit by telephone virtually in the context of Covid-19 pandemic. Patient location:  home My Location:  Remote/home office Persons on the call:  Me and the patient   History of Present Illness: Patient seen in the ED 12/22/2019 and tested positive for trich and was treated with flagyl, doxy, and rocephin to cover for PID.  She feels better.  Still having some urinary frequency.  Denies vaginal d/c or itching.  No f/c.  She currently lives in [place] but is moving here and needs to transition her care here.  Needs rheumatologist for lupus.   Denies diabetes   Observations/Objective: NAD.  A&Ox3  Assessment and Plan: 1. SLE/Lupus continue - hydroxychloroquine (PLAQUENIL) 200 MG tablet; Take 1 tablet (200 mg total) by mouth daily.  Dispense: 30 tablet; Refill: 2 - Ambulatory referral to Rheumatology  2. Hyperglycemia I have had a lengthy discussion and provided education about insulin resistance and the intake of too much sugar/refined carbohydrates.  I have advised the patient to work at a goal of eliminating sugary drinks, candy, desserts, sweets, refined sugars, processed foods, and white carbohydrates.  The patient expresses understanding.  - Hemoglobin A1c  3. H/O trichomonal urethritis Will test of cure - POCT URINALYSIS DIP (CLINITEK) - self-swab  ancillary only Urine for culture   4. Encounter for examination following treatment at hospital Doing  well   Follow Up Instructions: Assign PCP in 4-6 weeks   I discussed the assessment and treatment plan with the patient. The patient was provided an opportunity to ask questions and all were answered. The patient agreed with the plan and demonstrated an understanding of the instructions.   The patient was advised to call back or seek an in-person evaluation if the symptoms worsen or if the condition fails to improve as anticipated.  I provided 9 minutes of non-face-to-face time during this encounter.   Georgian Co, PA-C  Patient ID: Evelyn [name], female   DOB: 21-Dec-1981, 39 y.o.   MRN: [id number]

## 2020-01-13 NOTE — Progress Notes (Signed)
Pt. Is request Lupus medication refills.

## 2020-01-14 ENCOUNTER — Other Ambulatory Visit: Payer: Self-pay | Admitting: Physician Assistant

## 2020-01-14 DIAGNOSIS — A599 Trichomoniasis, unspecified: Secondary | ICD-10-CM

## 2020-01-14 DIAGNOSIS — Z8619 Personal history of other infectious and parasitic diseases: Secondary | ICD-10-CM

## 2020-01-14 LAB — CERVICOVAGINAL ANCILLARY ONLY
Bacterial Vaginitis (gardnerella): POSITIVE — AB
Candida Glabrata: NEGATIVE
Candida Vaginitis: NEGATIVE
Chlamydia: NEGATIVE
Comment: NEGATIVE
Comment: NEGATIVE
Comment: NEGATIVE
Comment: NEGATIVE
Comment: NEGATIVE
Comment: NORMAL
Neisseria Gonorrhea: NEGATIVE
Trichomonas: POSITIVE — AB

## 2020-01-14 LAB — HEMOGLOBIN A1C
Est. average glucose Bld gHb Est-mCnc: 105 mg/dL
Hgb A1c MFr Bld: 5.3 % (ref 4.8–5.6)

## 2020-01-14 MED ORDER — METRONIDAZOLE 500 MG PO TABS: 500.0000 mg | ORAL_TABLET | Freq: Two times a day (BID) | ORAL | 0 refills | Status: DC

## 2020-01-15 LAB — URINE CULTURE

## 2020-01-18 ENCOUNTER — Telehealth: Payer: Self-pay | Admitting: *Deleted

## 2020-01-18 NOTE — Telephone Encounter (Signed)
Patient verified DOB Patient is are of [facility] and BV being noted on STI and needing treatment. Patient continues to only ackowledge needing prednisone for Lupus. Patient is scheduled with PCP for 2/5.

## 2020-01-18 NOTE — Telephone Encounter (Signed)
Voicemail states to give a call back to Johntavius Shepard with [facility] at [phone]. Medical Assistant left message on patient's home and cell voicemail.  

## 2020-01-18 NOTE — Telephone Encounter (Signed)
-----   Message from Angela M McClung, PA-C sent at 01/14/2020  3:55 PM EST ----- Please call patient and fill out form for health dept.  Patient is still positive for trichomonas and needs treatment again.  I sent Rx. Any partners must also be treated.   Thanks, Angela McClung, PA-C 

## 2020-01-18 NOTE — Telephone Encounter (Signed)
-----   Message from Anders Simmonds, [place] sent at 01/14/2020  3:55 PM EST ----- Please call patient and fill out form for health dept.  Patient is still positive for trichomonas and needs treatment again.  I sent Rx. Any partners must also be treated.   Thanks, Georgian Co, PA-C

## 2020-01-19 ENCOUNTER — Other Ambulatory Visit: Payer: Self-pay | Admitting: Physician Assistant

## 2020-01-19 MED ORDER — PREDNISONE 10 MG PO TABS: ORAL_TABLET | ORAL | 0 refills | Status: DC

## 2020-01-19 NOTE — Telephone Encounter (Signed)
I sent her an Rx to take 2 prednisone daily until she sees her PCP.  Thanks, Georgian Co, PA-C

## 2020-02-05 ENCOUNTER — Ambulatory Visit: Payer: Medicaid - Out of State | Attending: Internal Medicine | Admitting: Internal Medicine

## 2020-02-05 ENCOUNTER — Other Ambulatory Visit: Payer: Self-pay

## 2020-02-05 DIAGNOSIS — M329 Systemic lupus erythematosus, unspecified: Secondary | ICD-10-CM

## 2020-02-05 NOTE — Progress Notes (Signed)
Pt states she is having pain in right hand

## 2020-02-05 NOTE — Progress Notes (Signed)
Virtual Visit via Telephone Note Due to current restrictions/limitations of in-office visits due to the COVID-19 pandemic, this scheduled clinical appointment was converted to a telehealth visit  I connected with Evelyn [name] on 02/05/20 at 3:15 p.m by telephone and verified that I am speaking with the correct person using two identifiers. I am in my office.  The patient is at home.  Only the patient and myself participated in this encounter.  I discussed the limitations, risks, security and privacy concerns of performing an evaluation and management service by telephone and the availability of in person appointments. I also discussed with the patient that there may be a patient responsible charge related to this service. The patient expressed understanding and agreed to proceed.   History of Present Illness: Patient with history of SLE (with complications of nephritis, pleural effusion, pericardial tamponade, AVN with bilateral THR), tobacco dependence, G6PD deficiency   There was an issue connecting with this patient.  She had a morning appointment.  I called her twice around 9:20 AM and there was no answer.  I left her voicemail message informing her of what I am and why I was calling and that if time allowed we will try to get her later.  Patient did call back about 20 minutes later but I was unable to speak with her at that time.  I called her again at 3:15 PM and was able to speak with her.  Previous PCP was with [facility] in [place].  Patient does not recall his name.  Last seen in May 2020 Gives hx of SLE, RA, fibromyalgia.  I question her reports of RA as I am able to see some of the records on Care Everywhere and there is no mention of RA.  Last saw rheumatologist over 1 yr ago She was seeing a rheumatologist, nephrologist, ophthalmologist and ortho at [facility] in [place] -Suppose to be on Plaqueinil, Prednisone and AZO (for lupus neph). On Prednisone for 10 yrs.   The highest dose was 60 mg and lowest was 2.5 mg.  Was out of it for 2 mths before speaking with our PA last mth.  She was restarted on prednisone 20 mg daily.  Referral submitted for her to see a rheumatologist.  She has been working on trying to get her Medicaid transferred from [place] to [place]. Last eye exam over 1 yr ago Working on getting Medicaid change to    Observations/Objective:   Chemistry      Component Value Date/Time   NA 137 12/23/2019 0006   K 4.0 12/23/2019 0006   CL 100 12/23/2019 0006   CO2 28 12/23/2019 0006   BUN 9 12/23/2019 0006   CREATININE 0.83 12/23/2019 0006      Component Value Date/Time   CALCIUM 9.5 12/23/2019 0006   ALKPHOS 72 12/23/2019 0006   AST 19 12/23/2019 0006   ALT 18 12/23/2019 0006   BILITOT 0.5 12/23/2019 0006     Lab Results  Component Value Date   WBC 9.7 12/23/2019   HGB 12.2 12/23/2019   HCT 38.8 12/23/2019   MCV 93.9 12/23/2019   PLT 320 12/23/2019     Assessment and Plan: 1. SLE/Lupus -Patient advised of the importance of getting yearly eye exam since being restarted on Plaquenil.  I told her of places in the area that would be cost effective for her to get the eye exam.  However patient states she will wait until she has been approved for Medicaid. -Advised to  pick up forms for orange card/cone discount from our front desk and applied for them as a bridge until she gets Medicaid approve from [place]. -Advised that I will be tapering the prednisone.  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 -At this point patient was cutting in and out due to poor connection and to call was dropped.  I tried calling her back twice but it went to voicemail.  Patient called back about 20 to 30 minutes later but by that time I was seeing another patient.  I told the front desk to schedule her for a follow-up  appointment.   Follow Up Instructions:    I discussed the assessment and treatment plan with the patient. The patient was provided an opportunity to ask questions and all were answered. The patient agreed with the plan and demonstrated an understanding of the instructions.   The patient was advised to call back or seek an in-person evaluation if the symptoms worsen or if the condition fails to improve as anticipated.  I provided 12 minutes of non-face-to-face time during this encounter.   Jonah Blue, MD

## 2020-02-22 ENCOUNTER — Ambulatory Visit: Payer: Medicaid - Out of State | Admitting: Internal Medicine

## 2020-03-01 ENCOUNTER — Ambulatory Visit: Payer: Self-pay | Attending: Internal Medicine | Admitting: Internal Medicine

## 2020-03-01 ENCOUNTER — Other Ambulatory Visit: Payer: Self-pay

## 2020-03-11 ENCOUNTER — Ambulatory Visit: Payer: Medicaid - Out of State | Attending: Internal Medicine | Admitting: Internal Medicine

## 2020-03-11 ENCOUNTER — Other Ambulatory Visit: Payer: Self-pay

## 2020-03-11 DIAGNOSIS — F419 Anxiety disorder, unspecified: Secondary | ICD-10-CM | POA: Diagnosis not present

## 2020-03-11 DIAGNOSIS — M329 Systemic lupus erythematosus, unspecified: Secondary | ICD-10-CM | POA: Diagnosis not present

## 2020-03-11 DIAGNOSIS — F4320 Adjustment disorder, unspecified: Secondary | ICD-10-CM | POA: Insufficient documentation

## 2020-03-11 DIAGNOSIS — F4321 Adjustment disorder with depressed mood: Secondary | ICD-10-CM | POA: Diagnosis not present

## 2020-03-11 MED ORDER — BUSPIRONE HCL 5 MG PO TABS: 5.0000 mg | ORAL_TABLET | Freq: Two times a day (BID) | ORAL | 0 refills | Status: DC

## 2020-03-11 MED ORDER — PREDNISONE 5 MG PO TABS: 5.0000 mg | ORAL_TABLET | Freq: Every day | ORAL | 0 refills | Status: DC

## 2020-03-11 NOTE — Progress Notes (Signed)
Virtual Visit via Telephone Note Due to current restrictions/limitations of in-office visits due to the COVID-19 pandemic, this scheduled clinical appointment was converted to a telehealth visit  I connected with Evelyn [name] on 03/11/20 at 12:44 p.m by telephone and verified that I am speaking with the correct person using two identifiers.   I discussed the limitations, risks, security and privacy concerns of performing an evaluation and management service by telephone and the availability of in person appointments. I also discussed with the patient that there may be a patient responsible charge related to this service. The patient expressed understanding and agreed to proceed.   History of Present Illness: Patient with history of SLE (with complications of nephritis, pleural effusion, pericardial tamponade, AVN with bilateral THR), tobacco dependence, G6PD deficiency    Of note patient no showed her in person appointment with [place] a few weeks ago.  Pt c/o having anxiety attacks since her boyfriend died 02-27-2020 in his sleep.  They had known each other for 20 yrs. They lived together off and on for past 10 yrs.  She has been staying at a friend's house who is out of town. Very anxious and "I can't stop crying." Reports poor appetite.  Denies SI ideation.  She has been taking with his family and with her mom but mom lives in [place].   SLE:  Some swelling in hands and feet.  She tells me that her skin is falling off. Out Prednisone x 3 wks. Not able to eat when she is not on Prednisone.  She is wanting refill on prednisone because "I do not want to be 80 lbs again."  Was on Remeron at one time for depression and appetite.  But states the Remeron did not help with her appetite.   Outpatient Encounter Medications as of 03/11/2020  Medication Sig Note  . acetaminophen (TYLENOL) 500 MG tablet Take 2,000 mg by mouth every 6 (six) hours as needed for moderate pain. 11/22/2019: Pt stated she took 4  tablets each times she took  . hydroxychloroquine (PLAQUENIL) 200 MG tablet Take 1 tablet (200 mg total) by mouth daily.   . metroNIDAZOLE (FLAGYL) 500 MG tablet Take 1 tablet (500 mg total) by mouth 2 (two) times daily. (Patient not taking: Reported on 03/11/2020)   . predniSONE (DELTASONE) 10 MG tablet Take 2 tabs daily until you see your PCP (Patient not taking: Reported on 02/05/2020)    No facility-administered encounter medications on file as of 03/11/2020.    Observations/Objective:   Chemistry      Component Value Date/Time   NA 137 12/23/2019 0006   K 4.0 12/23/2019 0006   CL 100 12/23/2019 0006   CO2 28 12/23/2019 0006   BUN 9 12/23/2019 0006   CREATININE 0.83 12/23/2019 0006      Component Value Date/Time   CALCIUM 9.5 12/23/2019 0006   ALKPHOS 72 12/23/2019 0006   AST 19 12/23/2019 0006   ALT 18 12/23/2019 0006   BILITOT 0.5 12/23/2019 0006       Assessment and Plan: 1. Grief reaction We will have LCSW touch base with her to inform of resources for grief counseling.  Patient is agreeable to this.  2. SLE/Lupus Advised patient that I am not comfortable putting her on 20 mg of prednisone daily given her history of AVN requiring hip replacements.  I also see from looking at [facility] that Evelyn [name] had stopped prescribing the prednisone for her the early part of last year as  she was not following up for appointments.  Also prednisone is not primarily indicated to be an appetite stimulant.  I went over some of the harmful side effects of long-term prednisone use.  Patient tells me that when she is not on prednisone she started swelling up again in hands and feet and has aching in her body.  Advised patient that she will need to see a rheumatologist and if needed we will need to work out a payment plan with them. -For now I will refill prednisone but only at 5 mg for 2 weeks and then after that I will try to decrease the dose further to 3 mg. - Ambulatory  referral to Rheumatology - predniSONE (DELTASONE) 5 MG tablet; Take 1 tablet (5 mg total) by mouth daily with breakfast.  Dispense: 14 tablet; Refill: 0  3. Anxiety See #1 above I recommend trial of BuSpar to help with anxiety.  Patient is willing to give this a try - busPIRone (BUSPAR) 5 MG tablet; Take 1 tablet (5 mg total) by mouth 2 (two) times daily.  Dispense: 60 tablet; Refill: 0   Follow Up Instructions: 4 wks in person   I discussed the assessment and treatment plan with the patient. The patient was provided an opportunity to ask questions and all were answered. The patient agreed with the plan and demonstrated an understanding of the instructions.   The patient was advised to call back or seek an in-person evaluation if the symptoms worsen or if the condition fails to improve as anticipated.  I provided 30 minutes of non-face-to-face time during this encounter.   Jonah Blue, MD

## 2020-03-11 NOTE — Progress Notes (Signed)
Pt states her hands and feet have been swelling   Pt states she has been having anxiety attacks. Pt states her boyfriend died on the 05-15-2023 of 03-03-2023.

## 2020-03-15 ENCOUNTER — Telehealth: Payer: Self-pay | Admitting: Internal Medicine

## 2020-03-15 NOTE — Telephone Encounter (Signed)
-----   Message from Dionne Bucy sent at 03/14/2020 11:24 AM EDT ----- Gm  Dr Ella Jubilee Referral to [facility]  03/14/2020 Ph# [phone] Fax# [phone] [address]. They will contact the patient to schedule an appointment. ----- Message ----- From: Marcine Matar, MD Sent: 03/11/2020   1:22 PM EDT To: Dionne Bucy  Can you please work on trying to get her in with a rheumatologist here in [place].  She is awaiting transfer of her Medicare from [place].  However she is willing to work out a payment plan to pay out-of-pocket to be seen.

## 2020-03-23 ENCOUNTER — Telehealth: Payer: Self-pay | Admitting: Internal Medicine

## 2020-03-23 NOTE — Telephone Encounter (Signed)
Will forward to pcp

## 2020-03-23 NOTE — Telephone Encounter (Signed)
Jasmine please see Dr. Laural Benes Note

## 2020-03-23 NOTE — Telephone Encounter (Signed)
Please see Dr. Laural Benes note

## 2020-03-23 NOTE — Telephone Encounter (Signed)
Contacted pt and left a detailed vm informing pt of Dr. Laural Benes response regarding the prednisone and that she will be getting a call from [place] the social worker and Arna Medici regarding referral and if she has any questions or concerns to give me a call

## 2020-03-23 NOTE — Telephone Encounter (Signed)
Patient called to report that she believes the listed medication is not working. Patient stated that she has not eaten in 4 days and feels like she is going to starve to death, but has no appetite. Patient also stated that she has been in the bed for 3 days and just has been feeling the urge to cry. Please follow up at your earliest convenience.   predniSONE (DELTASONE) 5 MG tablet [[id number]]  busPIRone (BUSPAR) 5 MG tablet [[id number]]

## 2020-03-24 NOTE — Telephone Encounter (Signed)
Gm  [facility] refuse the referral . I'm waiting for Dr Zacarias Pontes respond to see if they can see the patient

## 2020-03-24 NOTE — Telephone Encounter (Signed)
Dr Zacarias Pontes respond  We appreciate your referral. Each referral is reviewed by our providers to ensure that the patient will receive the best medical care possible. Upon review of this referral Dr. Corliss Skains has declined it.  3. Sent Referral to [facility]   Ph# [phone]  address [facility] [address] [place], [place]  [address]. Waiting for respond

## 2020-03-29 NOTE — Telephone Encounter (Signed)
Respond from [facility]  Rejection Reason - Patient Declined - WE DONT TAKE THIS INS" [facility].  Sent Referral to [facility] Rheumatology and arthritis  Ph# [phone] address [address] [place]   They are requesting  Previous records  I spoke to patient  on February and she was suppose to come to [employer] and sign a medical request  form also send the records to [facility]  Rheumatology  .

## 2020-03-30 ENCOUNTER — Telehealth: Payer: Self-pay | Admitting: Licensed Clinical Social Worker

## 2020-03-30 NOTE — Telephone Encounter (Signed)
Call placed to patient regarding [facility] referral. LCSW left message requesting a return call.  

## 2020-03-31 ENCOUNTER — Other Ambulatory Visit: Payer: Self-pay | Admitting: Internal Medicine

## 2020-03-31 DIAGNOSIS — M329 Systemic lupus erythematosus, unspecified: Secondary | ICD-10-CM

## 2020-03-31 MED ORDER — PREDNISONE 5 MG PO TABS: 5.0000 mg | ORAL_TABLET | Freq: Every day | ORAL | 0 refills | Status: DC

## 2020-04-02 ENCOUNTER — Encounter (HOSPITAL_COMMUNITY): Payer: Self-pay

## 2020-04-02 ENCOUNTER — Emergency Department (HOSPITAL_COMMUNITY)
Admission: EM | Admit: 2020-04-02 | Discharge: 2020-04-02 | Disposition: A | Discharge status: Home / Self Care | Payer: Medicaid - Out of State | Attending: Emergency Medicine | Admitting: Emergency Medicine

## 2020-04-02 ENCOUNTER — Other Ambulatory Visit: Payer: Self-pay

## 2020-04-02 DIAGNOSIS — F17210 Nicotine dependence, cigarettes, uncomplicated: Secondary | ICD-10-CM | POA: Diagnosis not present

## 2020-04-02 DIAGNOSIS — R220 Localized swelling, mass and lump, head: Secondary | ICD-10-CM | POA: Insufficient documentation

## 2020-04-02 DIAGNOSIS — M329 Systemic lupus erythematosus, unspecified: Secondary | ICD-10-CM | POA: Diagnosis not present

## 2020-04-02 DIAGNOSIS — H5789 Other specified disorders of eye and adnexa: Secondary | ICD-10-CM | POA: Diagnosis present

## 2020-04-02 DIAGNOSIS — Z79899 Other long term (current) drug therapy: Secondary | ICD-10-CM | POA: Insufficient documentation

## 2020-04-02 DIAGNOSIS — IMO0002 Reserved for concepts with insufficient information to code with codable children: Secondary | ICD-10-CM

## 2020-04-02 LAB — CBC
HCT: 41.3 % (ref 36.0–46.0)
Hemoglobin: 12.7 g/dL (ref 12.0–15.0)
MCH: 29.5 pg (ref 26.0–34.0)
MCHC: 30.8 g/dL (ref 30.0–36.0)
MCV: 95.8 fL (ref 80.0–100.0)
Platelets: 257 10*3/uL (ref 150–400)
RBC: 4.31 MIL/uL (ref 3.87–5.11)
RDW: 13.2 % (ref 11.5–15.5)
WBC: 6.4 10*3/uL (ref 4.0–10.5)
nRBC: 0 % (ref 0.0–0.2)

## 2020-04-02 LAB — BASIC METABOLIC PANEL
Anion gap: 9 (ref 5–15)
BUN: 7 mg/dL (ref 6–20)
CO2: 28 mmol/L (ref 22–32)
Calcium: 9.1 mg/dL (ref 8.9–10.3)
Chloride: 104 mmol/L (ref 98–111)
Creatinine, Ser: 0.8 mg/dL (ref 0.44–1.00)
GFR calc Af Amer: >60 mL/min (ref >60)
GFR calc non Af Amer: >60 mL/min (ref >60)
Glucose, Bld: 89 mg/dL (ref 70–99)
Potassium: 3.8 mmol/L (ref 3.5–5.1)
Sodium: 141 mmol/L (ref 135–145)

## 2020-04-02 MED ORDER — POLYMYXIN B-TRIMETHOPRIM 10000-0.1 UNIT/ML-% OP SOLN: 1.0000 [drp] | OPHTHALMIC | 0 refills | Status: DC

## 2020-04-02 MED ORDER — HYDROCODONE-ACETAMINOPHEN 5-325 MG PO TABS
1.0000 | ORAL_TABLET | ORAL | Status: AC
  Administered 2020-04-02: 13:00:00 1 via ORAL
  Filled 2020-04-02: qty 1

## 2020-04-02 MED ORDER — HYDROXYCHLOROQUINE SULFATE 200 MG PO TABS: 200.0000 mg | ORAL_TABLET | Freq: Every day | ORAL | 2 refills | Status: DC

## 2020-04-02 MED ORDER — FLUORESCEIN SODIUM 1 MG OP STRP
1.0000 | ORAL_STRIP | Freq: Once | OPHTHALMIC | Status: AC
  Administered 2020-04-02: 1 via OPHTHALMIC
  Filled 2020-04-02: qty 1

## 2020-04-02 MED ORDER — KETOROLAC TROMETHAMINE 0.5 % OP SOLN: 1.0000 [drp] | Freq: Four times a day (QID) | OPHTHALMIC | 0 refills | Status: DC

## 2020-04-02 MED ORDER — PREDNISONE 10 MG (21) PO TBPK: ORAL_TABLET | ORAL | 0 refills | Status: AC

## 2020-04-02 MED ORDER — MORPHINE SULFATE (PF) 4 MG/ML IV SOLN
4.0000 mg | Freq: Once | INTRAVENOUS | Status: AC
  Administered 2020-04-02: 14:00:00 4 mg via INTRAVENOUS
  Filled 2020-04-02: qty 1

## 2020-04-02 MED ORDER — TETRACAINE HCL 0.5 % OP SOLN
2.0000 [drp] | Freq: Once | OPHTHALMIC | Status: AC
  Administered 2020-04-02: 13:00:00 2 [drp] via OPHTHALMIC
  Filled 2020-04-02: qty 4

## 2020-04-02 NOTE — ED Triage Notes (Signed)
Onset 2 weeks pt has been out of meds - Prednisone d/t changes with Medicaid.  In the past week pt has had generalized swelling and pain.  Onset 2 days left eye swollen, painful, and sclera reddened.  No drainage from eye or matting.

## 2020-04-02 NOTE — ED Notes (Signed)
Woods lamp, visual acuity screening, fluorescein ophthalmic strip, and pontocaine solution at bedside.

## 2020-04-02 NOTE — Discharge Instructions (Signed)
Use the eyedrops to help with the eye swelling.  It is a nonsteroidal eyedrop but I do not think it should interfere with your medications or lupus.  Take the steroid regimen and resume your Plaquenil.  Follow-up with your primary care doctor and rheumatologist as planned.  Make an appointment with an ophthalmologist to have your eye further examined

## 2020-04-02 NOTE — ED Notes (Signed)
Pt verbalized understanding of discharge instructions. Follow up care and prescriptions reviewed, pt had no further questions. 
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
## 2020-04-05 ENCOUNTER — Telehealth: Payer: Self-pay | Admitting: Licensed Clinical Social Worker

## 2020-04-05 NOTE — Telephone Encounter (Signed)
Call placed to patient. LCSW introduced self and explained role at [facility]. Pt was informed of referral from PCP to address behavioral health and/or resource needs.   Pt scheduled face to face appointment for 04/07/2020. She states that her medication (Buspirone) has not been effective in managing depression symptoms, "it's making it worse" PCP will be notified of medication concerns.  LCSW informed patient that she has an upcoming appointment with PCP on 04/18/20. Pt was also informed that a medical release of information needs to be signed to have medical documentation sent to [facility] for review. Pt verbalized understanding. No additional concerns noted.

## 2020-04-07 ENCOUNTER — Institutional Professional Consult (permissible substitution): Payer: Medicaid - Out of State | Admitting: Licensed Clinical Social Worker

## 2020-04-13 ENCOUNTER — Other Ambulatory Visit: Payer: Self-pay | Admitting: Internal Medicine

## 2020-04-13 DIAGNOSIS — F419 Anxiety disorder, unspecified: Secondary | ICD-10-CM

## 2020-04-18 ENCOUNTER — Other Ambulatory Visit: Payer: Self-pay

## 2020-04-18 ENCOUNTER — Ambulatory Visit: Payer: Medicaid - Out of State | Attending: Internal Medicine | Admitting: Internal Medicine

## 2020-04-18 ENCOUNTER — Encounter: Payer: Self-pay | Admitting: Internal Medicine

## 2020-04-18 VITALS — BP 126/85 | HR 99 | Temp 97.5°F | Resp 16 | Wt 120.2 lb

## 2020-04-18 DIAGNOSIS — F419 Anxiety disorder, unspecified: Secondary | ICD-10-CM

## 2020-04-18 DIAGNOSIS — Z1331 Encounter for screening for depression: Secondary | ICD-10-CM

## 2020-04-18 DIAGNOSIS — M329 Systemic lupus erythematosus, unspecified: Secondary | ICD-10-CM
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
## 2020-08-19 ENCOUNTER — Encounter (HOSPITAL_COMMUNITY): Payer: Self-pay | Admitting: Emergency Medicine

## 2020-08-19 DIAGNOSIS — Z79899 Other long term (current) drug therapy: Secondary | ICD-10-CM | POA: Diagnosis not present

## 2020-08-19 DIAGNOSIS — F17210 Nicotine dependence, cigarettes, uncomplicated: Secondary | ICD-10-CM | POA: Insufficient documentation

## 2020-08-19 DIAGNOSIS — R112 Nausea with vomiting, unspecified: Secondary | ICD-10-CM | POA: Diagnosis not present

## 2020-08-19 DIAGNOSIS — F1210 Cannabis abuse, uncomplicated: Secondary | ICD-10-CM | POA: Insufficient documentation

## 2020-08-19 DIAGNOSIS — I959 Hypotension, unspecified: Secondary | ICD-10-CM | POA: Diagnosis not present

## 2020-08-19 LAB — COMPREHENSIVE METABOLIC PANEL
ALT: 32 U/L (ref 0–44)
AST: 23 U/L (ref 15–41)
Albumin: 4.3 g/dL (ref 3.5–5.0)
Alkaline Phosphatase: 65 U/L (ref 38–126)
Anion gap: 11 (ref 5–15)
BUN: 12 mg/dL (ref 6–20)
CO2: 26 mmol/L (ref 22–32)
Calcium: 9.3 mg/dL (ref 8.9–10.3)
Chloride: 105 mmol/L (ref 98–111)
Creatinine, Ser: 0.67 mg/dL (ref 0.44–1.00)
GFR calc Af Amer: >60 mL/min (ref >60)
GFR calc non Af Amer: >60 mL/min (ref >60)
Glucose, Bld: 107 mg/dL — ABNORMAL HIGH (ref 70–99)
Potassium: 3.8 mmol/L (ref 3.5–5.1)
Sodium: 142 mmol/L (ref 135–145)
Total Bilirubin: 0.7 mg/dL (ref 0.3–1.2)
Total Protein: 8.3 g/dL — ABNORMAL HIGH (ref 6.5–8.1)

## 2020-08-19 LAB — URINALYSIS, ROUTINE W REFLEX MICROSCOPIC
Bacteria, UA: NONE SEEN
Bilirubin Urine: NEGATIVE
Glucose, UA: NEGATIVE mg/dL
Hgb urine dipstick: NEGATIVE
Ketones, ur: NEGATIVE mg/dL
Leukocytes,Ua: NEGATIVE
Nitrite: NEGATIVE
Protein, ur: 100 mg/dL — AB
Specific Gravity, Urine: 1.02 (ref 1.005–1.030)
pH: 8 (ref 5.0–8.0)

## 2020-08-19 LAB — I-STAT BETA HCG BLOOD, ED (MC, WL, AP ONLY): I-stat hCG, quantitative: <5 m[IU]/mL (ref <5)

## 2020-08-19 LAB — CBC
HCT: 37.8 % (ref 36.0–46.0)
Hemoglobin: 12.1 g/dL (ref 12.0–15.0)
MCH: 29.4 pg (ref 26.0–34.0)
MCHC: 32 g/dL (ref 30.0–36.0)
MCV: 92 fL (ref 80.0–100.0)
Platelets: 243 10*3/uL (ref 150–400)
RBC: 4.11 MIL/uL (ref 3.87–5.11)
RDW: 13.8 % (ref 11.5–15.5)
WBC: 12.6 10*3/uL — ABNORMAL HIGH (ref 4.0–10.5)
nRBC: 0 % (ref 0.0–0.2)

## 2020-08-19 LAB — LIPASE, BLOOD: Lipase: 52 U/L — ABNORMAL HIGH (ref 11–51)

## 2020-08-19 MED ORDER — PROMETHAZINE HCL 25 MG/ML IJ SOLN
12.5000 mg | Freq: Once | INTRAMUSCULAR | Status: AC
  Administered 2020-08-19: 12.5 mg via INTRAVENOUS
  Filled 2020-08-19: qty 1

## 2020-08-19 MED ORDER — MORPHINE SULFATE (PF) 4 MG/ML IV SOLN
4.0000 mg | Freq: Once | INTRAVENOUS | Status: AC
  Administered 2020-08-19: 4 mg via INTRAVENOUS
  Filled 2020-08-19: qty 1

## 2020-08-19 NOTE — ED Triage Notes (Signed)
Per EMS-vomiting and generalized pain all over-patient has a history of hypotension and Lupus-symptoms for 3 hours-took 8 mg of home Zofran with no relief

## 2020-08-19 NOTE — ED Triage Notes (Signed)
Emergency Medicine Provider Triage Evaluation Note  Evelyn [name] , a 39 y.o. female  was evaluated in triage.  Pt complains of lower abdominal pain, nausea, vomiting onset 3 hours PTA.  Denies fever, diarrhea, constipation, diarrhea, constipation.  Had 4 days of vaginal bleeding that stopped, usually doesn't get periods.  Denies vaginal discharge. Drank small amount of alcohol 2 days ago.  Smokes marijuana.   Review of Systems  Positive: Abdominal pain nausea vomiting Negative: Fever diarrhea constipation vaginal discharge   Physical Exam  BP (!) 129/93 (BP Location: Left Arm)   Pulse 69   Temp 97.6 F (36.4 C) (Oral)   Resp 20   SpO2 100%  Gen:   Awake, dry heaving vomiting in triage   HEENT:  Atraumatic  Resp:  Normal effort  Cardiac:  Normal rate  Abd:   Patient refused abdominal exam states "I don't want to accidentally hit you if you touch something that hurts", despite encouraging her to let me examine her for triage.  States "lower part of abdomen" hurts.  MSK:   Moves extremities without difficulty  Neuro:  Speech clear   Medical Decision Making  Medically screening exam initiated at 5:20 PM.  Appropriate orders placed.  Khamryn Calderone was informed that the remainder of the evaluation will be completed by another provider, this initial triage assessment does not replace that evaluation, and the importance of remaining in the ED until their evaluation is complete.  Clinical Impression    Viral gastro vs COVID vs cannabinoid hyperemesis vs other  RN stacy to start IV and given morphine, phenergan.  Patient with anaphylaxis to reglan, took 8 mg zofran PTA without relief   Liberty Handy, PA-C 08/19/20 1725

## 2020-08-20 ENCOUNTER — Emergency Department (HOSPITAL_COMMUNITY)
Admission: EM | Admit: 2020-08-20 | Discharge: 2020-08-20 | Disposition: A | Discharge status: Home / Self Care | Payer: Medicaid - Out of State | Attending: Emergency Medicine | Admitting: Emergency Medicine

## 2020-08-20 DIAGNOSIS — R112 Nausea with vomiting, unspecified: Secondary | ICD-10-CM

## 2020-08-20 DIAGNOSIS — M329 Systemic lupus erythematosus, unspecified: Secondary | ICD-10-CM

## 2020-08-20 MED ORDER — ONDANSETRON 4 MG PO TBDP
4.0000 mg | ORAL_TABLET | Freq: Once | ORAL | Status: AC
  Administered 2020-08-20: 4 mg via ORAL
  Filled 2020-08-20: qty 1

## 2020-08-20 MED ORDER — SODIUM CHLORIDE 0.9 % IV BOLUS
1000.0000 mL | Freq: Once | INTRAVENOUS | Status: AC
  Administered 2020-08-20: 1000 mL via INTRAVENOUS

## 2020-08-20 MED ORDER — ONDANSETRON 8 MG PO TBDP: ORAL_TABLET | ORAL | 0 refills | Status: DC

## 2020-08-20 MED ORDER — SODIUM CHLORIDE 0.9 % IV BOLUS
500.0000 mL | Freq: Once | INTRAVENOUS | Status: AC
  Administered 2020-08-20: 500 mL via INTRAVENOUS

## 2020-08-20 MED ORDER — MORPHINE SULFATE (PF) 4 MG/ML IV SOLN
4.0000 mg | Freq: Once | INTRAVENOUS | Status: AC
  Administered 2020-08-20: 4 mg via INTRAVENOUS
  Filled 2020-08-20: qty 1

## 2020-08-20 MED ORDER — ONDANSETRON HCL 4 MG/2ML IJ SOLN
4.0000 mg | Freq: Once | INTRAMUSCULAR | Status: AC
  Administered 2020-08-20: 4 mg via INTRAVENOUS
  Filled 2020-08-20: qty 2

## 2020-08-20 NOTE — Discharge Instructions (Addendum)
Begin taking Zofran as prescribed as needed for nausea.  Clear liquid diet for the next 12 hours, then slowly advance to normal as tolerated.  Return to the emergency department for worsening pain, high fever, bloody stools, or other new and concerning symptoms.
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
## 2020-12-29 ENCOUNTER — Emergency Department (HOSPITAL_COMMUNITY): Payer: Medicaid - Out of State

## 2020-12-29 ENCOUNTER — Emergency Department (HOSPITAL_COMMUNITY)
Admission: EM | Admit: 2020-12-29 | Discharge: 2020-12-29 | Discharge status: Against Medical Advice | Payer: Medicaid - Out of State | Attending: Emergency Medicine | Admitting: Emergency Medicine

## 2020-12-29 ENCOUNTER — Emergency Department (HOSPITAL_COMMUNITY)
Admission: EM | Admit: 2020-12-29 | Discharge: 2020-12-30 | Disposition: A | Discharge status: Home / Self Care | Payer: Medicaid - Out of State | Source: Home / Self Care | Attending: Emergency Medicine | Admitting: Emergency Medicine

## 2020-12-29 ENCOUNTER — Encounter (HOSPITAL_COMMUNITY): Payer: Self-pay

## 2020-12-29 DIAGNOSIS — Z20822 Contact with and (suspected) exposure to covid-19: Secondary | ICD-10-CM | POA: Insufficient documentation

## 2020-12-29 DIAGNOSIS — R6883 Chills (without fever): Secondary | ICD-10-CM | POA: Insufficient documentation

## 2020-12-29 DIAGNOSIS — M791 Myalgia, unspecified site: Secondary | ICD-10-CM | POA: Insufficient documentation

## 2020-12-29 DIAGNOSIS — R109 Unspecified abdominal pain: Secondary | ICD-10-CM | POA: Insufficient documentation

## 2020-12-29 DIAGNOSIS — R55 Syncope and collapse: Secondary | ICD-10-CM | POA: Insufficient documentation

## 2020-12-29 DIAGNOSIS — R10813 Right lower quadrant abdominal tenderness: Secondary | ICD-10-CM | POA: Insufficient documentation

## 2020-12-29 DIAGNOSIS — R1030 Lower abdominal pain, unspecified: Secondary | ICD-10-CM | POA: Insufficient documentation

## 2020-12-29 DIAGNOSIS — Z79899 Other long term (current) drug therapy: Secondary | ICD-10-CM | POA: Insufficient documentation

## 2020-12-29 DIAGNOSIS — R112 Nausea with vomiting, unspecified: Secondary | ICD-10-CM

## 2020-12-29 DIAGNOSIS — Z96643 Presence of artificial hip joint, bilateral: Secondary | ICD-10-CM | POA: Insufficient documentation

## 2020-12-29 DIAGNOSIS — R52 Pain, unspecified: Secondary | ICD-10-CM

## 2020-12-29 DIAGNOSIS — R1084 Generalized abdominal pain: Secondary | ICD-10-CM

## 2020-12-29 DIAGNOSIS — F17210 Nicotine dependence, cigarettes, uncomplicated: Secondary | ICD-10-CM | POA: Insufficient documentation

## 2020-12-29 DIAGNOSIS — Z5321 Procedure and treatment not carried out due to patient leaving prior to being seen by health care provider: Secondary | ICD-10-CM | POA: Insufficient documentation

## 2020-12-29 LAB — COMPREHENSIVE METABOLIC PANEL
ALT: 24 U/L (ref 0–44)
AST: 27 U/L (ref 15–41)
Albumin: 4.7 g/dL (ref 3.5–5.0)
Alkaline Phosphatase: 48 U/L (ref 38–126)
Anion gap: 16 — ABNORMAL HIGH (ref 5–15)
BUN: 13 mg/dL (ref 6–20)
CO2: 24 mmol/L (ref 22–32)
Calcium: 9.7 mg/dL (ref 8.9–10.3)
Chloride: 104 mmol/L (ref 98–111)
Creatinine, Ser: 0.79 mg/dL (ref 0.44–1.00)
GFR, Estimated: >60 mL/min (ref >60)
Glucose, Bld: 94 mg/dL (ref 70–99)
Potassium: 3.2 mmol/L — ABNORMAL LOW (ref 3.5–5.1)
Sodium: 144 mmol/L (ref 135–145)
Total Bilirubin: 0.6 mg/dL (ref 0.3–1.2)
Total Protein: 8.2 g/dL — ABNORMAL HIGH (ref 6.5–8.1)

## 2020-12-29 LAB — HEPATIC FUNCTION PANEL
ALT: 24 U/L (ref 0–44)
AST: 27 U/L (ref 15–41)
Albumin: 4.6 g/dL (ref 3.5–5.0)
Alkaline Phosphatase: 51 U/L (ref 38–126)
Bilirubin, Direct: 0.1 mg/dL (ref 0.0–0.2)
Indirect Bilirubin: 0.6 mg/dL (ref 0.3–0.9)
Total Bilirubin: 0.7 mg/dL (ref 0.3–1.2)
Total Protein: 8.3 g/dL — ABNORMAL HIGH (ref 6.5–8.1)

## 2020-12-29 LAB — RESP PANEL BY RT-PCR (FLU A&B, COVID) ARPGX2
Influenza A by PCR: NEGATIVE
Influenza B by PCR: NEGATIVE
SARS Coronavirus 2 by RT PCR: NEGATIVE

## 2020-12-29 LAB — CBC
HCT: 37.5 % (ref 36.0–46.0)
Hemoglobin: 11.9 g/dL — ABNORMAL LOW (ref 12.0–15.0)
MCH: 30.1 pg (ref 26.0–34.0)
MCHC: 31.7 g/dL (ref 30.0–36.0)
MCV: 94.7 fL (ref 80.0–100.0)
Platelets: 305 10*3/uL (ref 150–400)
RBC: 3.96 MIL/uL (ref 3.87–5.11)
RDW: 13.5 % (ref 11.5–15.5)
WBC: 16.4 10*3/uL — ABNORMAL HIGH (ref 4.0–10.5)
nRBC: 0 % (ref 0.0–0.2)

## 2020-12-29 LAB — I-STAT BETA HCG BLOOD, ED (MC, WL, AP ONLY): I-stat hCG, quantitative: <5 m[IU]/mL (ref <5)

## 2020-12-29 LAB — CBG MONITORING, ED: Glucose-Capillary: 79 mg/dL (ref 70–99)

## 2020-12-29 LAB — LIPASE, BLOOD: Lipase: 26 U/L (ref 11–51)

## 2020-12-29 MED ORDER — FENTANYL CITRATE (PF) 100 MCG/2ML IJ SOLN
50.0000 ug | Freq: Once | INTRAMUSCULAR | Status: AC
  Administered 2020-12-30: 50 ug via INTRAVENOUS
  Filled 2020-12-29: qty 2

## 2020-12-29 MED ORDER — SODIUM CHLORIDE 0.9 % IV SOLN
2.0000 g | Freq: Once | INTRAVENOUS | Status: AC
  Administered 2020-12-30: 2 g via INTRAVENOUS
  Filled 2020-12-29: qty 20

## 2020-12-29 MED ORDER — METRONIDAZOLE IN NACL 5-0.79 MG/ML-% IV SOLN
500.0000 mg | Freq: Once | INTRAVENOUS | Status: AC
  Administered 2020-12-30: 500 mg via INTRAVENOUS
  Filled 2020-12-29: qty 100

## 2020-12-29 MED ORDER — ONDANSETRON HCL 4 MG/2ML IJ SOLN
4.0000 mg | Freq: Once | INTRAMUSCULAR | Status: AC
  Administered 2020-12-30: 4 mg via INTRAVENOUS
  Filled 2020-12-29: qty 2

## 2020-12-29 MED ORDER — SODIUM CHLORIDE 0.9 % IV BOLUS
1000.0000 mL | Freq: Once | INTRAVENOUS | Status: AC
  Administered 2020-12-29: 20:00:00 1000 mL via INTRAVENOUS

## 2020-12-29 MED ORDER — POTASSIUM CHLORIDE 10 MEQ/100ML IV SOLN
10.0000 meq | Freq: Once | INTRAVENOUS | Status: AC
  Administered 2020-12-30: 10 meq via INTRAVENOUS
  Filled 2020-12-29: qty 100

## 2020-12-29 MED ORDER — IOHEXOL 300 MG/ML  SOLN
100.0000 mL | Freq: Once | INTRAMUSCULAR | Status: AC | PRN
  Administered 2020-12-29: 22:00:00 100 mL via INTRAVENOUS

## 2020-12-29 MED ORDER — ONDANSETRON HCL 4 MG/2ML IJ SOLN
4.0000 mg | Freq: Once | INTRAMUSCULAR | Status: AC
Start: 2020-12-29 — End: 2020-12-29
  Administered 2020-12-29: 20:00:00 4 mg via INTRAVENOUS
  Filled 2020-12-29: qty 2
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
## 2020-12-29 NOTE — ED Provider Notes (Signed)
Patient signed out to me at shift change.  Plan: CT ab/pel.  Suspected appy.  CT was negative for appy.  Still having significant right adnexal pain.  At 2304 I ordered pain medication and [place] to rule out torsion.   At ~0015 [place] reported as normal uterus, but ovaries not visualized due to patient's pain.  Patient was never medicated.  I've talked with charge and triage nursing who will now medicate the patient and she will need to complete the [place] studies to rule out torsion.  [place] negative for torsion.  No clear etiology of the patient's symptoms are found tonight.  Will discharge with PCP follow-up.  Return precautions discussed.   Roxy Horseman, PA-C 12/30/20 0216    Milagros Loll, MD 01/01/21 [phone]

## 2020-12-29 NOTE — ED Triage Notes (Addendum)
Pt presents with c/o abdominal pain/cramping, chills, n/v since 3 am. Pt has a 22 g left AC placed by EMS, given 8 mg zofran en route. Pt also has some generalized body aches.

## 2020-12-29 NOTE — ED Notes (Addendum)
Pt asked if she was able to have orthostatic vital signs taken, pt responded that she felt "weak, cold, and shaky" and that she did not feel comfortable attempting to stand. RN notified, will attempt at a later time.

## 2020-12-29 NOTE — ED Triage Notes (Signed)
Pt presents with c/o positive LOC. Pt checked in approx 1.5 hours ago and then left AMA when she found out about the wait time. Pt initially came for chills, body aches, and N/V. Pt left to go sit in the lobby and wait for her ride. Pt then decided to check back in and registration said pt fell down onto the ground in front of the registration window. Pt assisted into a triage chair, pt is alert and oriented at this time.

## 2020-12-29 NOTE — ED Notes (Signed)
Pt reported to this RN that she was leaving.

## 2020-12-29 NOTE — ED Notes (Addendum)
Patient refusing to wear a mask for lab draw. Unable to collect at this time.

## 2020-12-29 NOTE — ED Notes (Signed)
Attempted to place pt back into lobby to wait on a room, Charge RN aware. Pt refused to go.

## 2020-12-29 NOTE — ED Notes (Signed)
Notified by RN Sharon Seller pt is refusing labs and all treatments, unable to perform xrays without pregnancy test.

## 2020-12-30 ENCOUNTER — Other Ambulatory Visit: Payer: Self-pay

## 2020-12-30 ENCOUNTER — Emergency Department (HOSPITAL_COMMUNITY): Payer: Medicaid - Out of State

## 2020-12-30 MED ORDER — HYDROMORPHONE HCL 1 MG/ML IJ SOLN
1.0000 mg | Freq: Once | INTRAMUSCULAR | Status: AC | PRN
  Administered 2020-12-30: 1 mg via INTRAVENOUS
  Filled 2020-12-30: qty 1

## 2020-12-30 MED ORDER — ONDANSETRON 4 MG PO TBDP: 4.0000 mg | ORAL_TABLET | Freq: Three times a day (TID) | ORAL | 0 refills | Status: DC | PRN

## 2020-12-30 NOTE — Discharge Instructions (Signed)
No clear cause for your abdominal pain was found tonight.  Your CT scan and ultrasound do not show any emergent findings.  Please follow-up with your doctor.  If your symptoms change or worsen, please return to the ER.  Please take medications as prescribed.

## 2020-12-30 NOTE — ED Notes (Signed)
Pt ambulated to restroom and back.

## 2021-07-24 ENCOUNTER — Encounter (HOSPITAL_COMMUNITY): Payer: Self-pay

## 2021-07-24 ENCOUNTER — Emergency Department (HOSPITAL_COMMUNITY)
Admission: EM | Admit: 2021-07-24 | Discharge: 2021-07-26 | Disposition: A | Discharge status: Home / Self Care | Payer: Medicaid - Out of State | Source: Home / Self Care | Attending: Emergency Medicine | Admitting: Emergency Medicine

## 2021-07-24 ENCOUNTER — Other Ambulatory Visit: Payer: Self-pay

## 2021-07-24 DIAGNOSIS — Z20822 Contact with and (suspected) exposure to covid-19: Secondary | ICD-10-CM | POA: Insufficient documentation

## 2021-07-24 DIAGNOSIS — Z79899 Other long term (current) drug therapy: Secondary | ICD-10-CM | POA: Insufficient documentation

## 2021-07-24 DIAGNOSIS — Z96643 Presence of artificial hip joint, bilateral: Secondary | ICD-10-CM | POA: Insufficient documentation

## 2021-07-24 DIAGNOSIS — B029 Zoster without complications: Secondary | ICD-10-CM

## 2021-07-24 DIAGNOSIS — N9489 Other specified conditions associated with female genital organs and menstrual cycle: Secondary | ICD-10-CM | POA: Insufficient documentation

## 2021-07-24 DIAGNOSIS — Y900 Blood alcohol level of less than 20 mg/100 ml: Secondary | ICD-10-CM | POA: Insufficient documentation

## 2021-07-24 DIAGNOSIS — R21 Rash and other nonspecific skin eruption: Secondary | ICD-10-CM | POA: Insufficient documentation

## 2021-07-24 DIAGNOSIS — J1089 Influenza due to other identified influenza virus with other manifestations: Secondary | ICD-10-CM | POA: Insufficient documentation

## 2021-07-24 DIAGNOSIS — F332 Major depressive disorder, recurrent severe without psychotic features: Secondary | ICD-10-CM | POA: Insufficient documentation

## 2021-07-24 DIAGNOSIS — F17210 Nicotine dependence, cigarettes, uncomplicated: Secondary | ICD-10-CM | POA: Insufficient documentation

## 2021-07-24 LAB — URINALYSIS, ROUTINE W REFLEX MICROSCOPIC
Bilirubin Urine: NEGATIVE
Glucose, UA: NEGATIVE mg/dL
Hgb urine dipstick: NEGATIVE
Ketones, ur: 20 mg/dL — AB
Leukocytes,Ua: NEGATIVE
Nitrite: NEGATIVE
Protein, ur: 100 mg/dL — AB
Specific Gravity, Urine: 1.023 (ref 1.005–1.030)
pH: 7 (ref 5.0–8.0)

## 2021-07-24 LAB — CBC WITH DIFFERENTIAL/PLATELET
Abs Immature Granulocytes: 0 10*3/uL (ref 0.00–0.07)
Basophils Absolute: 0.1 10*3/uL (ref 0.0–0.1)
Basophils Relative: 1 %
Eosinophils Absolute: 0 10*3/uL (ref 0.0–0.5)
Eosinophils Relative: 0 %
HCT: 43.1 % (ref 36.0–46.0)
Hemoglobin: 13.7 g/dL (ref 12.0–15.0)
Lymphocytes Relative: 20 %
Lymphs Abs: 1.1 10*3/uL (ref 0.7–4.0)
MCH: 29 pg (ref 26.0–34.0)
MCHC: 31.8 g/dL (ref 30.0–36.0)
MCV: 91.3 fL (ref 80.0–100.0)
Monocytes Absolute: 0.2 10*3/uL (ref 0.1–1.0)
Monocytes Relative: 3 %
Neutro Abs: 4.2 10*3/uL (ref 1.7–7.7)
Neutrophils Relative %: 76 %
Platelets: 233 10*3/uL (ref 150–400)
RBC: 4.72 MIL/uL (ref 3.87–5.11)
RDW: 13 % (ref 11.5–15.5)
WBC: 5.5 10*3/uL (ref 4.0–10.5)
nRBC: 0 % (ref 0.0–0.2)
nRBC: 0 /100 WBC

## 2021-07-24 LAB — COMPREHENSIVE METABOLIC PANEL
ALT: 16 U/L (ref 0–44)
AST: 25 U/L (ref 15–41)
Albumin: 4.4 g/dL (ref 3.5–5.0)
Alkaline Phosphatase: 67 U/L (ref 38–126)
Anion gap: 9 (ref 5–15)
BUN: 11 mg/dL (ref 6–20)
CO2: 26 mmol/L (ref 22–32)
Calcium: 9.9 mg/dL (ref 8.9–10.3)
Chloride: 102 mmol/L (ref 98–111)
Creatinine, Ser: 0.7 mg/dL (ref 0.44–1.00)
GFR, Estimated: >60 mL/min (ref >60)
Glucose, Bld: 81 mg/dL (ref 70–99)
Potassium: 4.2 mmol/L (ref 3.5–5.1)
Sodium: 137 mmol/L (ref 135–145)
Total Bilirubin: 1.1 mg/dL (ref 0.3–1.2)
Total Protein: 8.8 g/dL — ABNORMAL HIGH (ref 6.5–8.1)

## 2021-07-24 LAB — I-STAT BETA HCG BLOOD, ED (MC, WL, AP ONLY): I-stat hCG, quantitative: <5 m[IU]/mL (ref <5)

## 2021-07-24 LAB — RESP PANEL BY RT-PCR (FLU A&B, COVID) ARPGX2
Influenza A by PCR: NEGATIVE
Influenza B by PCR: NEGATIVE
SARS Coronavirus 2 by RT PCR: NEGATIVE

## 2021-07-24 LAB — ETHANOL: Alcohol, Ethyl (B): <10 mg/dL (ref <10)

## 2021-07-24 MED ORDER — BUSPIRONE HCL 10 MG PO TABS
5.0000 mg | ORAL_TABLET | Freq: Two times a day (BID) | ORAL | Status: DC
  Administered 2021-07-25 (×2): 5 mg via ORAL
  Filled 2021-07-24 (×2): qty 1

## 2021-07-24 MED ORDER — PREDNISONE 20 MG PO TABS: 30.0000 mg | ORAL_TABLET | Freq: Every day | ORAL | Status: DC

## 2021-07-24 MED ORDER — LORAZEPAM 1 MG PO TABS
1.0000 mg | ORAL_TABLET | ORAL | Status: DC | PRN
  Administered 2021-07-25 – 2021-07-26 (×2): 1 mg via ORAL
  Filled 2021-07-24 (×2): qty 1

## 2021-07-24 MED ORDER — SODIUM CHLORIDE 0.9 % IV BOLUS
1000.0000 mL | Freq: Once | INTRAVENOUS | Status: AC
  Administered 2021-07-24: 1000 mL via INTRAVENOUS

## 2021-07-24 MED ORDER — PREDNISONE 20 MG PO TABS
50.0000 mg | ORAL_TABLET | Freq: Every day | ORAL | Status: AC
  Administered 2021-07-26: 50 mg via ORAL
  Filled 2021-07-24: qty 3

## 2021-07-24 MED ORDER — PREDNISONE 20 MG PO TABS: 40.0000 mg | ORAL_TABLET | Freq: Every day | ORAL | Status: DC

## 2021-07-24 MED ORDER — VALACYCLOVIR HCL 500 MG PO TABS
1000.0000 mg | ORAL_TABLET | Freq: Three times a day (TID) | ORAL | Status: DC
Start: 2021-07-25 — End: 2021-07-26
  Administered 2021-07-25 – 2021-07-26 (×3): 1000 mg via ORAL
  Filled 2021-07-24 (×5): qty 2

## 2021-07-24 MED ORDER — ACETAMINOPHEN 325 MG PO TABS
650.0000 mg | ORAL_TABLET | Freq: Four times a day (QID) | ORAL | Status: DC | PRN
  Administered 2021-07-25 – 2021-07-26 (×2): 650 mg via ORAL
  Filled 2021-07-24 (×2): qty 2

## 2021-07-24 MED ORDER — VALACYCLOVIR HCL 1 G PO TABS
1000.0000 mg | ORAL_TABLET | Freq: Three times a day (TID) | ORAL | 0 refills | Status: DC
Start: 2021-07-24 — End: 2022-03-08

## 2021-07-24 MED ORDER — METHYLPREDNISOLONE SODIUM SUCC 125 MG IJ SOLR
125.0000 mg | Freq: Once | INTRAMUSCULAR | Status: AC
  Administered 2021-07-24: 125 mg via INTRAVENOUS
  Filled 2021-07-24: qty 2

## 2021-07-24 MED ORDER — PREDNISONE 20 MG PO TABS: 20.0000 mg | ORAL_TABLET | Freq: Every day | ORAL | Status: DC

## 2021-07-24 MED ORDER — VALACYCLOVIR HCL 500 MG PO TABS
1000.0000 mg | ORAL_TABLET | Freq: Once | ORAL | Status: AC
  Administered 2021-07-24: 1000 mg via ORAL
  Filled 2021-07-24: qty 2

## 2021-07-24 MED ORDER — PREDNISONE 20 MG PO TABS: ORAL_TABLET | ORAL | 0 refills | Status: DC

## 2021-07-24 MED ORDER — PREDNISONE 20 MG PO TABS
60.0000 mg | ORAL_TABLET | Freq: Every day | ORAL | Status: AC
  Administered 2021-07-25: 60 mg via ORAL
  Filled 2021-07-24: qty 3

## 2021-07-24 MED ORDER — ONDANSETRON 4 MG PO TBDP: 4.0000 mg | ORAL_TABLET | Freq: Three times a day (TID) | ORAL | Status: DC | PRN

## 2021-07-24 MED ORDER — HYDROXYCHLOROQUINE SULFATE 200 MG PO TABS
200.0000 mg | ORAL_TABLET | Freq: Every day | ORAL | Status: DC
  Administered 2021-07-25: 200 mg via ORAL
  Filled 2021-07-24 (×3): qty 1

## 2021-07-24 MED ORDER — OXYCODONE-ACETAMINOPHEN 5-325 MG PO TABS
1.0000 | ORAL_TABLET | Freq: Once | ORAL | Status: AC
  Administered 2021-07-24: 1 via ORAL
  Filled 2021-07-24: qty 1

## 2021-07-24 MED ORDER — ONDANSETRON HCL 4 MG/2ML IJ SOLN
4.0000 mg | Freq: Once | INTRAMUSCULAR | Status: AC
  Administered 2021-07-24: 4 mg via INTRAVENOUS
  Filled 2021-07-24: qty 2

## 2021-07-24 MED ORDER — PREDNISONE 20 MG PO TABS: 10.0000 mg | ORAL_TABLET | Freq: Every day | ORAL | Status: DC

## 2021-07-24 MED ORDER — OXYCODONE-ACETAMINOPHEN 5-325 MG PO TABS: 1.0000 | ORAL_TABLET | Freq: Four times a day (QID) | ORAL | 0 refills | Status: DC | PRN

## 2021-07-24 NOTE — ED Provider Notes (Signed)
Emergency Medicine Provider Triage Evaluation Note  Evelyn [name] , a 40 y.o. female  was evaluated in triage.  Pt complains of shingles rash, nausea, vomiting, body aches.  Patient states that does have been going on for about a week.  She has a rash on the right side of her chest which she states is consistent with her previous shingles rashes.  History of lupus, on prednisone and Plaquenil.  Has been unable to tolerate p.o. recently.  Has not received COVID-vaccine.  No sick contacts  Review of Systems  Positive: Rash, n/v, myalgias Negative: Fever, cough  Physical Exam  BP 113/82 (BP Location: Right Arm)   Pulse 74   Temp 98.4 F (36.9 C) (Oral)   Resp 16   SpO2 100%  Gen:   Awake, no distress   Resp:  Normal effort  MSK:   Moves extremities without difficulty  Other:  Rash c/w shingles. No ttp of the abd  Medical Decision Making  Medically screening exam initiated at 9:18 AM.  Appropriate orders placed.  Evelyn [name] was informed that the remainder of the evaluation will be completed by another provider, this initial triage assessment does not replace that evaluation, and the importance of remaining in the ED until their evaluation is complete.  Labs and Evelyn Portland, PA-C 07/24/21 0919    Mancel Bale, MD 07/25/21 [phone]

## 2021-07-24 NOTE — Social Work (Addendum)
CSW received consult that Pt wished to speak with social work prior to d/c. CSW covering remotely called Pt on phone provided by RN..  Pt states that she "doesn't want to be here" CSW asked what Pt meant by this statement and Pt responded by stating that her husband had died in 2020/05/01 and that she is now all alone. Pt further stated that her step-father passed away this past 03/03/2023 and that another friend also recently past.. CSW asked Pt if she is currently suicidal, Pt states that she "took a bunch of Tylenol this past Saturday, but all it did was make me sleep." CSW asked what would prevent Pt from acting on any suicidal thoughts, Pt stated that she wouldn't shoot herself because she, "doesn't have a gun"  CSW updated EDP.  CSW called Pt and obtained permission to add outpatient resources for grief and outpatient therapy to AVS. Resources added to AVS.

## 2021-07-24 NOTE — ED Triage Notes (Signed)
Patient concerned that she may be having another lupus flare-up or new episode of shingles. Redness noted under right breast and under right scapula, no drainage

## 2021-07-24 NOTE — ED Notes (Signed)
Patient stepped outside  

## 2021-07-24 NOTE — Discharge Instructions (Addendum)
Please take prednisone taper as prescribed   Take Valtrex as prescribed   Take oxycodone for severe pain. Do NOT drive with it   See your doctor for follow up   Return to ER if you have worse pain, fever, vomiting.     [facility] offers one-on-one grief counseling, support groups, workshops and other educational programs for adults and children. This support is available for anyone in [place], [place] and [place] and surrounding areas who is grieving the loss of a loved one. Your loved one did not have to be served by [employer] to receive these grief support services. [phone]

## 2021-07-24 NOTE — ED Notes (Signed)
RN unable to obtain IV access, Md Yao aware.
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
## 2021-07-25 ENCOUNTER — Emergency Department (HOSPITAL_COMMUNITY): Payer: Medicaid - Out of State

## 2021-07-25 ENCOUNTER — Encounter (HOSPITAL_COMMUNITY): Payer: Self-pay | Admitting: Registered Nurse

## 2021-07-25 MED ORDER — STERILE WATER FOR INJECTION IJ SOLN
INTRAMUSCULAR | Status: AC
  Filled 2021-07-25: qty 10

## 2021-07-25 MED ORDER — BUSPIRONE HCL 15 MG PO TABS
7.5000 mg | ORAL_TABLET | Freq: Two times a day (BID) | ORAL | Status: DC
  Administered 2021-07-25: 7.5 mg via ORAL
  Filled 2021-07-25 (×3): qty 1

## 2021-07-25 MED ORDER — LORAZEPAM 2 MG/ML IJ SOLN
2.0000 mg | Freq: Once | INTRAMUSCULAR | Status: AC
  Administered 2021-07-25: 2 mg via INTRAMUSCULAR
  Filled 2021-07-25: qty 1

## 2021-07-25 MED ORDER — DOCUSATE SODIUM 100 MG PO CAPS
100.0000 mg | ORAL_CAPSULE | Freq: Two times a day (BID) | ORAL | Status: DC | PRN
  Administered 2021-07-25: 100 mg via ORAL
  Filled 2021-07-25: qty 1

## 2021-07-25 MED ORDER — QUETIAPINE FUMARATE 50 MG PO TABS
50.0000 mg | ORAL_TABLET | Freq: Every day | ORAL | Status: DC
  Administered 2021-07-25: 50 mg via ORAL
  Filled 2021-07-25 (×2): qty 1

## 2021-07-25 MED ORDER — OXYCODONE-ACETAMINOPHEN 5-325 MG PO TABS
1.0000 | ORAL_TABLET | Freq: Once | ORAL | Status: AC
  Administered 2021-07-25: 1 via ORAL
  Filled 2021-07-25 (×2): qty 1

## 2021-07-25 MED ORDER — ZIPRASIDONE MESYLATE 20 MG IM SOLR
20.0000 mg | Freq: Once | INTRAMUSCULAR | Status: AC
  Administered 2021-07-25: 20 mg via INTRAMUSCULAR
  Filled 2021-07-25: qty 20

## 2021-07-25 MED ORDER — ONDANSETRON 4 MG PO TBDP
4.0000 mg | ORAL_TABLET | Freq: Once | ORAL | Status: AC
  Administered 2021-07-25: 4 mg via ORAL
  Filled 2021-07-25: qty 1

## 2021-07-25 MED ORDER — HYDROXYZINE HCL 25 MG PO TABS
25.0000 mg | ORAL_TABLET | Freq: Four times a day (QID) | ORAL | Status: DC | PRN
  Administered 2021-07-25 – 2021-07-26 (×2): 25 mg via ORAL
  Filled 2021-07-25 (×2): qty 1

## 2021-07-25 NOTE — BH Assessment (Signed)
Evelyn [name], AC at [facility], states there are currently no appropriate beds at [facility].    Pamalee Leyden, [facility], [facility] Triage Specialist [phone]

## 2021-07-25 NOTE — Consult Note (Addendum)
  Attempted to see Evelyn [name] 40 y.o. female patient via tele health related to patient requesting to leave the ED.    Notified by patients nurse that patient has asked to leave and has attempted to leave the ED.    Unable to complete tele psych related to patient had left her room and nursing attempting to get patient back to room.    Chart review: shows that  Evelyn [name] was admitted to [facility] ED after initially presenting with complaints of possible lupus flare up and shingle.    Patient later complained of worsening depression, anxiety, and suicidal ideation; informing she has attempted suicide via overdose of Tylenol PM that didn't even make her sleep.  Patient reporting she wants to go to sleep and not wake up.  Patient informed TTS counsel during assessment of symptoms that include crying spells, social withdrawal, loss of interest in usual pleasures, fatigue, irritability, decreased concentration, decreased sleep, decreased appetite and feelings of guilt, worthlessness, and hopelessness. She says she has lost 30 pounds due to poor appetite.  Patient also admitted to prior suicide attempts by drinking bleach and ingesting rat poison.    Also during TTS assessment patient identified chronic pain as her primary stressors and identified her medical problems as lupus, shingles, fibromyalgia, hip and back pain, and digestive problems.  Patient reported one previous psychiatric hospitalization at [facility].  Patient denied homicidal ideation and psychosis.  Patient has tried multiple medications that she states has not helped:  Remeron, Effexor, and Zoloft but found them ineffective.  Recommendation:  Patient was recommended for inpatient psychiatric treatment.  Patient appears to be high risk related multiple losses, recent suicide attempt, prior suicide attempts, and no support.  Will continue to recommend inpatient treatment and involuntary comment.   Medication management: Increase  Buspar to 7.5 mg bid for anxiety and depression Added Seroquel 50 mg Q hs for anxiety, depression, mood, and sleep   Disposition: Recommend psychiatric Inpatient admission when medically cleared. Involuntary Comment if necessary     Secure message sent to patient's nurse informing:  Chart reviewed; continue to recommend inpatient psychiatric treatment.  IVC if necessary.  Medication adjustments made.  Will attempt to reassess patient via tele psych tomorrow.  Please inform MD only default listed.    Evelyn [name] B. Monnie Gudgel, NP

## 2021-07-25 NOTE — ED Notes (Addendum)
RN notified that pt left through back area of psych holding. Pt went to follow and redirect pt back to bedroom. Pt attempted to get leave hospital. RN attempted to inform pt that they were going to the wrong exit and that if pt went back to room, RN could medicate patient for pain, get food and retrieve doctor.  Pt became combative with this RN and tech Vera. Security was called. MD [place] notified. MD Hong to place IVC. Pt became combative with [employer] present and threatened to bit. Pt escorted to back to room. Geodon and ativan IM given.

## 2021-07-25 NOTE — ED Notes (Signed)
Pt appears to be asleep. NAD noted. Sitter remains. 

## 2021-07-25 NOTE — ED Notes (Addendum)
Told pt I was going to get her a [place] sandwich. Pt replied "I don't want white bread." Pt also stated she does not eat ham or pork. I told pt the sandwich was [place], but the bread was white bread. Pt then stated "I am leaving" and began to walk out of her room. I notified RN that pt stated she was leaving due to being unable to eat, and RN and this tech followed pt. Pt did not response to name or when told she was going away from exit. [employer] on duty was then notified.

## 2021-07-25 NOTE — ED Notes (Signed)
Placed TTS machine in pt room. Pt stated "I'm not talking to anyone until I get something to eat and pain medicine."

## 2021-07-25 NOTE — ED Notes (Signed)
Pt remains asleep. Sitter remains. NAD noted. 

## 2021-07-25 NOTE — ED Notes (Signed)
Pt remains asleep. NAD noted. Sitter remains. 

## 2021-07-25 NOTE — ED Notes (Signed)
Pt continues to sleep. NAD noted. Sitter remains. Meal tray at bedside.
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
## 2021-07-25 NOTE — ED Notes (Signed)
Pt asked me to call her brother Jeralyn Bennett [phone]) and update him. I got a hold of him and let him know the plan. Pt aware.  Pt's list of NO FOODS Whole milk Green peppers Onions Roasted potatoes Mushrooms White bread Bananas Carrots Unsweet tea Splenda
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
## 2021-07-25 NOTE — ED Notes (Addendum)
I spoke with Evelyn [name], pt's mother and updated her per pt request.  Evelyn [name] requesting for [place] to let her know when pt is transferred and to which facility.

## 2021-07-25 NOTE — ED Notes (Signed)
Pt c/o itching due to rash and constipation. Dr. Royce Macadamia. See new orders.

## 2021-07-25 NOTE — Progress Notes (Signed)
Patient has been faxed out due to no bed available at [facility]. Patient meets inpatient criteria per Ene Ajibola,NP. Patient referred to the following facilities:  [facility]  [address], [place] [address] [phone]  [facility]  [address] [place] [address] [phone]  [facility]  [address], [place] [address] [phone]  [facility]  [address] [place] [address] [phone]  [facility]  [address] [place], [place] [address] [phone]  [facility]  [address] [place], [place] [address] [phone]  [facility]  [address] [place] [address] [phone]  [facility]  [address], [place] [address] [phone]  [facility]  [address], [place] [address] [phone]    CSW will continue to monitor disposition.    Damita Dunnings, MSW, LCSW-A  9:59 AM 07/25/2021

## 2021-07-25 NOTE — ED Notes (Signed)
Pt is up to use restroom.

## 2021-07-25 NOTE — ED Notes (Signed)
Pt given cheese slices for a snack. NAD noted. Sitter remains. Denies further needs.

## 2021-07-25 NOTE — ED Notes (Signed)
This RN came out of another pts room to find pts bed empty with other staff members stating "I think she ran away". This RN looked for pt, found her to be sitting outside EMS bay. This RN and Marylu Lund, RN brought pt inside. Pt resting in bed at this time.

## 2021-07-26 ENCOUNTER — Encounter (HOSPITAL_COMMUNITY): Payer: Self-pay | Admitting: Psychiatry

## 2021-07-26 ENCOUNTER — Inpatient Hospital Stay (HOSPITAL_COMMUNITY)
Admission: AD | Admit: 2021-07-26 | Discharge: 2021-07-30 | DRG: 881 | Disposition: A | Discharge status: Home / Self Care | Payer: Medicaid - Out of State | Source: Intra-hospital | Attending: Psychiatry | Admitting: Psychiatry

## 2021-07-26 ENCOUNTER — Other Ambulatory Visit: Payer: Self-pay

## 2021-07-26 DIAGNOSIS — Z91014 Allergy to mammalian meats: Secondary | ICD-10-CM

## 2021-07-26 DIAGNOSIS — E0590 Thyrotoxicosis, unspecified without thyrotoxic crisis or storm: Secondary | ICD-10-CM | POA: Diagnosis present

## 2021-07-26 DIAGNOSIS — M329 Systemic lupus erythematosus, unspecified: Secondary | ICD-10-CM | POA: Diagnosis present

## 2021-07-26 DIAGNOSIS — Z79899 Other long term (current) drug therapy: Secondary | ICD-10-CM

## 2021-07-26 DIAGNOSIS — K5900 Constipation, unspecified: Secondary | ICD-10-CM | POA: Diagnosis present

## 2021-07-26 DIAGNOSIS — Z87892 Personal history of anaphylaxis: Secondary | ICD-10-CM

## 2021-07-26 DIAGNOSIS — M797 Fibromyalgia: Secondary | ICD-10-CM | POA: Diagnosis present

## 2021-07-26 DIAGNOSIS — Z8619 Personal history of other infectious and parasitic diseases: Secondary | ICD-10-CM | POA: Diagnosis not present

## 2021-07-26 DIAGNOSIS — F17210 Nicotine dependence, cigarettes, uncomplicated: Secondary | ICD-10-CM | POA: Diagnosis present

## 2021-07-26 DIAGNOSIS — F329 Major depressive disorder, single episode, unspecified: Secondary | ICD-10-CM | POA: Diagnosis present

## 2021-07-26 DIAGNOSIS — Z560 Unemployment, unspecified: Secondary | ICD-10-CM

## 2021-07-26 DIAGNOSIS — Z885 Allergy status to narcotic agent status: Secondary | ICD-10-CM | POA: Diagnosis not present

## 2021-07-26 DIAGNOSIS — Z888 Allergy status to other drugs, medicaments and biological substances status: Secondary | ICD-10-CM

## 2021-07-26 DIAGNOSIS — B0229 Other postherpetic nervous system involvement: Secondary | ICD-10-CM | POA: Diagnosis present

## 2021-07-26 DIAGNOSIS — E0781 Sick-euthyroid syndrome: Secondary | ICD-10-CM | POA: Diagnosis present

## 2021-07-26 DIAGNOSIS — Z818 Family history of other mental and behavioral disorders: Secondary | ICD-10-CM

## 2021-07-26 DIAGNOSIS — B029 Zoster without complications: Secondary | ICD-10-CM | POA: Diagnosis present

## 2021-07-26 DIAGNOSIS — F32A Depression, unspecified: Secondary | ICD-10-CM | POA: Diagnosis present

## 2021-07-26 DIAGNOSIS — Z638 Other specified problems related to primary support group: Secondary | ICD-10-CM

## 2021-07-26 DIAGNOSIS — Z9151 Personal history of suicidal behavior: Secondary | ICD-10-CM

## 2021-07-26 DIAGNOSIS — M069 Rheumatoid arthritis, unspecified: Secondary | ICD-10-CM | POA: Diagnosis present

## 2021-07-26 DIAGNOSIS — Z20822 Contact with and (suspected) exposure to covid-19: Secondary | ICD-10-CM | POA: Diagnosis present

## 2021-07-26 DIAGNOSIS — Z9851 Tubal ligation status: Secondary | ICD-10-CM

## 2021-07-26 DIAGNOSIS — Z882 Allergy status to sulfonamides status: Secondary | ICD-10-CM | POA: Diagnosis not present

## 2021-07-26 DIAGNOSIS — Z91018 Allergy to other foods: Secondary | ICD-10-CM

## 2021-07-26 DIAGNOSIS — Z7952 Long term (current) use of systemic steroids: Secondary | ICD-10-CM | POA: Diagnosis not present

## 2021-07-26 DIAGNOSIS — G4700 Insomnia, unspecified: Secondary | ICD-10-CM | POA: Diagnosis present

## 2021-07-26 DIAGNOSIS — F332 Major depressive disorder, recurrent severe without psychotic features: Secondary | ICD-10-CM

## 2021-07-26 DIAGNOSIS — Z881 Allergy status to other antibiotic agents status: Secondary | ICD-10-CM

## 2021-07-26 MED ORDER — PANTOPRAZOLE SODIUM 40 MG PO TBEC
40.0000 mg | DELAYED_RELEASE_TABLET | Freq: Every day | ORAL | Status: DC
  Administered 2021-07-26 – 2021-07-30 (×5): 40 mg via ORAL
  Filled 2021-07-26 (×7): qty 1

## 2021-07-26 MED ORDER — DULOXETINE HCL 30 MG PO CPEP
30.0000 mg | ORAL_CAPSULE | Freq: Every day | ORAL | Status: DC
  Administered 2021-07-26 – 2021-07-29 (×4): 30 mg via ORAL
  Filled 2021-07-26 (×7): qty 1

## 2021-07-26 MED ORDER — ALUM & MAG HYDROXIDE-SIMETH 200-200-20 MG/5ML PO SUSP: 30.0000 mL | ORAL | Status: DC | PRN

## 2021-07-26 MED ORDER — LORAZEPAM 1 MG PO TABS: 1.0000 mg | ORAL_TABLET | Freq: Three times a day (TID) | ORAL | Status: DC | PRN

## 2021-07-26 MED ORDER — HYDROCODONE-ACETAMINOPHEN 5-325 MG PO TABS
1.0000 | ORAL_TABLET | Freq: Four times a day (QID) | ORAL | Status: DC | PRN
  Administered 2021-07-27 – 2021-07-28 (×2): 1 via ORAL
  Administered 2021-07-29: 2 via ORAL
  Filled 2021-07-26: qty 1
  Filled 2021-07-26 (×2): qty 2
  Filled 2021-07-26: qty 1
  Filled 2021-07-26: qty 2

## 2021-07-26 MED ORDER — GABAPENTIN 300 MG PO CAPS
300.0000 mg | ORAL_CAPSULE | Freq: Three times a day (TID) | ORAL | Status: DC
  Administered 2021-07-26 – 2021-07-27 (×3): 300 mg via ORAL
  Filled 2021-07-26 (×9): qty 1

## 2021-07-26 MED ORDER — PREDNISONE 10 MG (21) PO TBPK
10.0000 mg | ORAL_TABLET | Freq: Three times a day (TID) | ORAL | Status: DC
Start: 2021-07-27 — End: 2021-07-26

## 2021-07-26 MED ORDER — ZIPRASIDONE MESYLATE 20 MG IM SOLR
10.0000 mg | Freq: Three times a day (TID) | INTRAMUSCULAR | Status: DC | PRN
Start: 2021-07-26 — End: 2021-07-30

## 2021-07-26 MED ORDER — ZIPRASIDONE MESYLATE 20 MG IM SOLR
INTRAMUSCULAR | Status: AC
  Filled 2021-07-26: qty 20

## 2021-07-26 MED ORDER — PREDNISONE 10 MG (21) PO TBPK: 20.0000 mg | ORAL_TABLET | Freq: Every evening | ORAL | Status: DC

## 2021-07-26 MED ORDER — PREDNISONE 5 MG PO TABS
15.0000 mg | ORAL_TABLET | Freq: Every day | ORAL | Status: AC
  Administered 2021-07-29: 15 mg via ORAL
  Filled 2021-07-26: qty 3

## 2021-07-26 MED ORDER — HYDROXYCHLOROQUINE SULFATE 200 MG PO TABS
200.0000 mg | ORAL_TABLET | Freq: Two times a day (BID) | ORAL | Status: DC
  Administered 2021-07-26 – 2021-07-30 (×8): 200 mg via ORAL
  Filled 2021-07-26 (×10): qty 1

## 2021-07-26 MED ORDER — PREDNISONE 10 MG (21) PO TBPK: 10.0000 mg | ORAL_TABLET | ORAL | Status: DC

## 2021-07-26 MED ORDER — HYDROXYZINE HCL 25 MG PO TABS
25.0000 mg | ORAL_TABLET | Freq: Three times a day (TID) | ORAL | Status: DC | PRN
  Administered 2021-07-27 – 2021-07-30 (×5): 25 mg via ORAL
  Filled 2021-07-26 (×6): qty 1

## 2021-07-26 MED ORDER — PREDNISONE 5 MG PO TABS
25.0000 mg | ORAL_TABLET | Freq: Every day | ORAL | Status: AC
  Administered 2021-07-27: 25 mg via ORAL
  Filled 2021-07-26 (×2): qty 1

## 2021-07-26 MED ORDER — PREDNISONE 10 MG (21) PO TBPK
10.0000 mg | ORAL_TABLET | Freq: Four times a day (QID) | ORAL | Status: DC
Start: 2021-07-28 — End: 2021-07-26

## 2021-07-26 MED ORDER — TRAZODONE HCL 50 MG PO TABS
50.0000 mg | ORAL_TABLET | Freq: Every evening | ORAL | Status: DC | PRN
  Administered 2021-07-27 – 2021-07-29 (×2): 50 mg via ORAL
  Filled 2021-07-26 (×2): qty 1

## 2021-07-26 MED ORDER — PREDNISONE 10 MG PO TABS
10.0000 mg | ORAL_TABLET | Freq: Every day | ORAL | Status: DC
  Filled 2021-07-26: qty 1

## 2021-07-26 MED ORDER — PREDNISONE 10 MG PO TABS
30.0000 mg | ORAL_TABLET | Freq: Every day | ORAL | Status: AC
  Administered 2021-07-26: 30 mg via ORAL
  Filled 2021-07-26: qty 3

## 2021-07-26 MED ORDER — MAGNESIUM HYDROXIDE 400 MG/5ML PO SUSP: 30.0000 mL | Freq: Every day | ORAL | Status: DC | PRN

## 2021-07-26 MED ORDER — PREDNISONE 20 MG PO TABS
20.0000 mg | ORAL_TABLET | Freq: Every day | ORAL | Status: AC
  Administered 2021-07-26: 20 mg via ORAL
  Filled 2021-07-26: qty 1

## 2021-07-26 MED ORDER — PREDNISONE 10 MG (21) PO TBPK
10.0000 mg | ORAL_TABLET | ORAL | Status: DC
  Administered 2021-07-26: 10 mg via ORAL

## 2021-07-26 MED ORDER — ONDANSETRON 4 MG PO TBDP
8.0000 mg | ORAL_TABLET | Freq: Three times a day (TID) | ORAL | Status: DC | PRN
  Filled 2021-07-26: qty 2

## 2021-07-26 MED ORDER — ACETAMINOPHEN 325 MG PO TABS
650.0000 mg | ORAL_TABLET | Freq: Four times a day (QID) | ORAL | Status: DC | PRN
  Administered 2021-07-30: 650 mg via ORAL
  Filled 2021-07-26: qty 2

## 2021-07-26 MED ORDER — LIDOCAINE HCL URETHRAL/MUCOSAL 2 % EX GEL
1.0000 "application " | Freq: Once | CUTANEOUS | Status: AC
  Administered 2021-07-26: 1 via TOPICAL
  Filled 2021-07-26: qty 6

## 2021-07-26 MED ORDER — PREDNISONE 10 MG PO TABS
10.0000 mg | ORAL_TABLET | Freq: Every day | ORAL | Status: AC
  Administered 2021-07-26: 10 mg via ORAL

## 2021-07-26 MED ORDER — PREDNISONE 10 MG (21) PO TBPK
20.0000 mg | ORAL_TABLET | Freq: Every morning | ORAL | Status: DC
  Administered 2021-07-26: 20 mg via ORAL

## 2021-07-26 MED ORDER — PREDNISONE 5 MG PO TABS: 5.0000 mg | ORAL_TABLET | Freq: Every day | ORAL | Status: DC

## 2021-07-26 MED ORDER — VALACYCLOVIR HCL 500 MG PO TABS
1000.0000 mg | ORAL_TABLET | Freq: Three times a day (TID) | ORAL | Status: DC
  Administered 2021-07-26 – 2021-07-30 (×11): 1000 mg via ORAL
  Filled 2021-07-26 (×15): qty 2

## 2021-07-26 NOTE — ED Notes (Signed)
Patient continues to complain about receiving pork on her meal tray. Patient has not received pork on a meal tray today.

## 2021-07-26 NOTE — BHH Suicide Risk Assessment (Signed)
[facility] Admission Suicide Risk Assessment   Nursing information obtained from:    Demographic factors:    Current Mental Status:    Loss Factors:    Historical Factors:    Risk Reduction Factors:     Total Time spent with patient: 45 minutes Principal Problem: <principal problem not specified> Diagnosis:  Active Problems:   Depression  Subjective Data: Patient is seen and examined.  Patient is a 40 year old female with a reported past psychiatric history significant for depression and a reported past medical history significant for recent herpes zoster, unspecified rheumatological illness which she reports is rheumatoid arthritis as well as systemic lupus and several other connective tissue disorder problems that she claims to have who presented to the [facility] emergency department on 07/24/2021 complaining of having either another lupus flareup or a new episode of shingles.  There was redness noted underneath the right breast, as well as under her right scapula, but there were no open lesions.  She was evaluated by the advanced practice provider she told the emergency room physicians that her shingles outbreaks usually resolve with steroids and Valtrex.  She is on chronic prednisone 10 mg p.o. daily for her connective tissue disorder.  She stated that she was under a lot of stress recently but denied any plans to kill her self.  Social work received a consult that the patient wished to discuss with social work prior to discharge.  The patient stated that she "did not want to be here".  The patient told her that her husband had passed away in 05/05/2020 and she was all alone.  She stated that her stepfather passed away this previous 03-07-23, and then another friend also recently passed.  She admitted at that time she had taken an overdose of Tylenol the Saturday prior to admission.  Psychiatric consultation was obtained.  She admitted to multiple issues of depression and the decision was  made to admit her to the hospital for evaluation and stabilization.  On examination today she continues to state that she does not want to be here.  She stated she is all alone.  She stated that she has had the love of her life passed away at age 85, that her ex husband had passed away, and then her most recent husband died in 05-05-20.  She stated that she had a child, but that her mother had taken her child away from her at a very young age, and would not return her to her custody.  She stated she is currently staying with her ex-husband's father, but apparently its next to some apartments that she also lives in.  She stated that she sometimes rotates between houses that her mother owns in separate states.  The electronic medical record revealed multiple hospital visits as well as hospitalizations in [place].  She stated that she had overdosed in the past after her daughter had been taken away from her, but that she had not been treated with antidepressant medicines at discharge.  She stated that she had been on multiple antidepressants in the past and she stated several names of all the basic antidepressants.  She did not state that she had ever been treated with duloxetine in the past.  She is currently on disability and unemployed.  She has poor social support.  She was admitted to the hospital for evaluation and stabilization.  Continued Clinical Symptoms:    The "Alcohol Use Disorders Identification Test", Guidelines for Use in Primary Care, Second Edition.  World Science writer [facility]). Score between 0-7:  no or low risk or alcohol related problems. Score between 8-15:  moderate risk of alcohol related problems. Score between 16-19:  high risk of alcohol related problems. Score 20 or above:  warrants further diagnostic evaluation for alcohol dependence and treatment.   CLINICAL FACTORS:   Depression:   Anhedonia Hopelessness Impulsivity Insomnia Dysthymia Personality Disorders:   Cluster  B Previous Psychiatric Diagnoses and Treatments Medical Diagnoses and Treatments/Surgeries   Musculoskeletal: Strength & Muscle Tone: decreased Gait & Station: shuffle Patient leans: N/A  Psychiatric Specialty Exam:  Presentation  General Appearance: Disheveled  Eye Contact:Minimal  Speech:Normal Rate  Speech Volume:Decreased  Handedness:Right   Mood and Affect  Mood:Depressed  Affect:Congruent   Thought Process  Thought Processes:Goal Directed  Descriptions of Associations:Circumstantial  Orientation:Full (Time, Place and Person)  Thought Content:Logical; Rumination  History of Schizophrenia/Schizoaffective disorder:No  Duration of Psychotic Symptoms:No data recorded Hallucinations:Hallucinations: None  Ideas of Reference:None  Suicidal Thoughts:Suicidal Thoughts: Yes, Passive SI Passive Intent and/or Plan: Without Intent; Without Plan  Homicidal Thoughts:Homicidal Thoughts: No   Sensorium  Memory:Immediate Poor; Recent Poor; Remote Poor  Judgment:Impaired  Insight:Lacking   Executive Functions  Concentration: No data recorded Attention Span:Fair  Recall:Poor  Fund of Knowledge:Fair  Language:Good   Psychomotor Activity  Psychomotor Activity:Psychomotor Activity: Increased   Assets  Assets:Desire for Improvement; Resilience   Sleep  Sleep:Sleep: Poor    Physical Exam: Physical Exam Vitals and nursing note reviewed.  HENT:     Head: Normocephalic and atraumatic.  Pulmonary:     Effort: Pulmonary effort is normal.  Neurological:     General: No focal deficit present.     Mental Status: She is alert and oriented to person, place, and time.   Review of Systems  Constitutional:  Positive for malaise/fatigue.  Gastrointestinal:  Positive for abdominal pain, nausea and vomiting.  Musculoskeletal:  Positive for myalgias.  Skin:  Positive for itching and rash.  Neurological:  Positive for tingling, sensory change and  weakness.  Psychiatric/Behavioral:  Positive for depression and suicidal ideas. The patient is nervous/anxious and has insomnia.   There were no vitals taken for this visit. There is no height or weight on file to calculate BMI.   COGNITIVE FEATURES THAT CONTRIBUTE TO RISK:  Polarized thinking    SUICIDE RISK:   Mild:  Suicidal ideation of limited frequency, intensity, duration, and specificity.  There are no identifiable plans, no associated intent, mild dysphoria and related symptoms, good self-control (both objective and subjective assessment), few other risk factors, and identifiable protective factors, including available and accessible social support.  PLAN OF CARE: Patient is seen and examined.  Patient is a 40 year old female with the above-stated past psychiatric history who was admitted secondary to death thoughts.  She will be admitted to the hospital.  She will be integrated in the milieu.  She will be encouraged to attend groups.  She has not been treated with Cymbalta in the past, and I am going to start her on 30 mg p.o. daily.  Hopefully this will help her peripheral nerve pain as well as her fibromyalgia.  She is on chronic steroid therapy, but had been blasted with steroids in the emergency room because of the possible shingles.  The areas that I were able to examine today showed erythema, but no open lesions.  Hopefully the Valtrex and steroid dosage increase will be beneficial.  She is on chronic Valtrex therapy as well for genital herpes.  She  stated that she is currently taking Plaquenil, and we will confirm that dosage.  I have continue the Valtrex at 1000 mg p.o. 3 times daily and a steroid taper.  Clearly long-term steroid therapy if she continues to get blast evident for her rheumatological illness could easily exacerbate depressive symptoms.  We discussed her chronic pain issues, and she stated that her rheumatological and primary care doctors will not give her any pain  medicines because "you are going to be in pain for the rest your life".  I asked her whether or not she had been referred to a chronic pain clinic and she denied that.  She ruminated on the fact that she had to eat white bread, that the rice and fish was cold, and that she just wants to be comfortable here in the hospital.  Review of her admission laboratories revealed a normal creatinine at 0.70.  Liver function enzymes were normal.  She shows no evidence of anemia or lymphopenia that would be included with lupus.  MCV is normal.  Platelets are normal.  Differential was normal.  She does not appear to have a malar rash.  Beta-hCG was negative.  Respiratory panel was negative.  Despite having stated that she had taken a Tylenol overdose it does not appear that they did a Tylenol level.  We will obtain 1 of those stat.  Her liver function enzymes are normal and that somewhat reassuring.  Her urinalysis showed 20 mg per DL of ketones and 161 mg per DL of protein.  There were few bacteria but no white blood cells.  A plain x-ray of her pelvis was obtained bilaterally and it showed bilateral hip replacements that were in position properly.  Her EKG showed a normal sinus rhythm with a normal QTc interval.  Drug screen was not obtained and I will also order that stat.  Review of the electronic medical record revealed the [place] [facility] [place] note that she had been diagnosed with systemic lupus erythematosus in October 2010.  She also had an episode apparently of lupus nephritis in 08/05/2008.  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  She also apparently has had a pericardial effusion as well as tamponade with that effusion.  I have also added gabapentin 300 mg p.o. 3 times daily for chronic pain issues, hydrocodone 5/325 1 to 2 tablets p.o. every 6 hours as needed pain.  Also a drug screen is not been  obtained, and we will order that.  Her vital signs are stable, she is afebrile.  I certify that inpatient services furnished can reasonably be expected to improve the patient's condition.   Antonieta Pert, MD 07/26/2021, 2:03 PM

## 2021-07-26 NOTE — Tx Team (Signed)
Initial Treatment Plan 07/26/2021 7:39 PM Catalina Salasar JOI:[id number]    PATIENT STRESSORS: Loss of husband, stepfather, and friend Traumatic event   PATIENT STRENGTHS: Religious Affiliation   PATIENT IDENTIFIED PROBLEMS: Pt wouldn't answer                      DISCHARGE CRITERIA:  Ability to meet basic life and health needs Improved stabilization in mood, thinking, and/or behavior Medical problems require only outpatient monitoring Motivation to continue treatment in a less acute level of care  PRELIMINARY DISCHARGE PLAN: Outpatient therapy Return to previous living arrangement  PATIENT/FAMILY INVOLVEMENT: This treatment plan has been presented to and reviewed with the patient, Leilah Polimeni, and/or family member.  The patient and family have been given the opportunity to ask questions and make suggestions.  Elpidio Anis, RN 07/26/2021, 7:39 PM

## 2021-07-26 NOTE — ED Notes (Addendum)
[place] Sandwich w/ Tomatoes, and Mayo on [employer], and Fruit, Mash Potatoes w/ Ginger Ale and Decaf Coffee, Lunch.

## 2021-07-26 NOTE — [facility] Group Notes (Signed)
LCSW Group Therapy Note  07/26/2021   Type of Therapy and Topic:  Group Therapy - Healthy vs Unhealthy Coping Skills  Participation Level:  Did Not Attend   Description of Group The focus of this group was to determine what unhealthy coping techniques typically are used by group members and what healthy coping techniques would be helpful in coping with various problems. Patients were guided in becoming aware of the differences between healthy and unhealthy coping techniques. Patients were asked to identify 2-3 healthy coping skills they would like to learn to use more effectively, and many mentioned meditation, breathing, and relaxation. These were explained, samples demonstrated, and resources shared for how to learn more at discharge. At group closing, additional ideas of healthy coping skills were shared in a fun exercise.  Therapeutic Goals Patients learned that coping is what human beings do all day long to deal with various situations in their lives Patients defined and discussed healthy vs unhealthy coping techniques Patients identified their preferred coping techniques and identified whether these were healthy or unhealthy Patients determined 2-3 healthy coping skills they would like to become more familiar with and use more often, and practiced a few medications Patients provided support and ideas to each other   Summary of Patient Progress:  Pt did not attend group despite personal invitation.   Therapeutic Modalities Cognitive Behavioral Therapy Motivational Interviewing  Fredirick Lathe, LCSWA Clinicial Social Worker [employer]

## 2021-07-26 NOTE — ED Notes (Signed)
Patient states "if I can't be comfortable nobody is going to be comfortable." Patient complains about being hungry and having not eaten in three days. Patient received and ate boiled eggs and fruit as requested for breakfast this morning. Patient complains that wheat toast was not warm enough, patient refused offer to re-warm toast.

## 2021-07-26 NOTE — ED Notes (Signed)
Pt appears to be asleep. NAD noted. Sitter remains. 

## 2021-07-26 NOTE — ED Notes (Signed)
Patient was given [employer], Cup of Ginger Ale w/ ice and, Cheese for [employer].

## 2021-07-26 NOTE — Progress Notes (Signed)
Patient  very agitated yelling after during dinner, crying inconsolable would not talk to staff. It took a lot of encouragement for her to deescalate.   Support and and encouragement given  to Patient Patient calmed after a lot of redirection stated " I do not want to eat in the dinning room with a lot of people because I have Lupus I prefer to go and choose what I want to eat and bring the food to my room and eat because every time I go around a lot of people I end up getting so sick. I have a lot of health problems. When I was coming here I had ordered my food and they told me to throw my food because I could not bring my food to this hospital.  Patient was reassured and given another tray of food to eat. She appreciated the support and the time staff took helping her to calm down. She ate her food and took her medications.  Denies HI/A/VH endorses Passive SI but verbally contracted for safety.

## 2021-07-26 NOTE — ED Notes (Addendum)
Patient complaining about smelling other foods when she is hungry. Patient complains repeatedly that she received pork on her tray, patient is convinced the chicken sausage patty on her breakfast tray was made with pork.

## 2021-07-26 NOTE — BH Assessment (Addendum)
Disposition:   Per Shuvon NP, cont inpt treatment. @2345 , notified [facility] AC (Tosin, RN) of patient's bed needs. [facility] Reviewing - with limited bed availability at [facility], SW to refer patient to appropriate programs. Patient has been faxed out to multiple hospitals for consideration of bed placement.     Destination  Service Provider Request Status Selected Services Address Phone Fax Patient Preferred  [facility]  Pending - Request Sent N/A [address] [place] [phone] --  [facility]  Pending - Request Sent N/A [address], [place] [phone] --  [facility]  Pending - Request Sent N/A [address] [facility] [place] [phone] --  [facility]  Pending - Request Sent N/A [address], [place] [phone] --  [facility]  Pending - Request Sent N/A [address] [place], [place] [phone] --  [facility]  Pending - Request Sent N/A [address] [place], [place] [phone] --  [facility]  Pending - Request Sent N/A [address] [place] [phone] --  [facility]  Pending - Request Sent N/A [address] [place] [phone] --  [facility]  Pending - Request Sent N/A [address], [place] [phone] --

## 2021-07-26 NOTE — ED Notes (Signed)
Pt continues to sleep. NAD noted. Sitter remains. 

## 2021-07-26 NOTE — ED Notes (Signed)
Patient left ambulatory in no apparent distress with police for transport to [facility].

## 2021-07-26 NOTE — ED Notes (Addendum)
Pt called out asking for pain medicine and saying that her right side hurts

## 2021-07-26 NOTE — ED Notes (Signed)
Sitter obtained VS on pt and came and told me pt is very sweaty. I went in and woke pt up to speak with her and asked her if she was hot and she said that she was sweaty because she we woke her up from a bad dream. Denies further needs.

## 2021-07-26 NOTE — Progress Notes (Signed)
Pt accepted to [facility] 303-1     Patient meets inpatient criteria per Assunta Found, NP  Dr. Landry Mellow is the attending provider.    Call report to [phone]    Edwina Barth, RN @ [facility] ED notified.     Pt scheduled  to arrive at [facility] today by 12 Noon.    Damita Dunnings, MSW, LCSW-A  11:09 AM 07/26/2021

## 2021-07-26 NOTE — ED Notes (Signed)
I checked in on pt in room again. Pt is no longer sweaty and continues to sleep.

## 2021-07-26 NOTE — Progress Notes (Signed)
Psychoeducational Group Note  Date:  07/26/2021 Time:  2055  Group Topic/Focus:  Wrap-Up Group:   The focus of this group is to help patients review their daily goal of treatment and discuss progress on daily workbooks.  Participation Level: Did Not Attend  Participation Quality:  Not Applicable  Affect:  Not Applicable  Cognitive:  Not Applicable  Insight:  Not Applicable  Engagement in Group: Not Applicable  Additional Comments:  The patient did not attend group this evening.   Hazle Coca S 07/26/2021, 8:56 PM

## 2021-07-26 NOTE — Progress Notes (Signed)
Patient information has been sent to [facility] via secure chat to review for potential admission. Patient meets inpatient criteria per Assunta Found, NP.   Situation ongoing, CSW will continue to monitor progress.    Signed:  Damita Dunnings, MSW, LCSW-A  07/26/2021 10:29 AM

## 2021-07-26 NOTE — H&P (Signed)
Psychiatric Admission Assessment Adult  Patient Identification: Tiyana Galla MRN:  [id number] Date of Evaluation:  07/26/2021 Chief Complaint:  Depression [F32.A] Principal Diagnosis: <principal problem not specified> Diagnosis:  Active Problems:   Depression  History of Present Illness: Patient is seen and examined.  Patient is a 40 year old female with a reported past psychiatric history significant for depression and a reported past medical history significant for recent herpes zoster, unspecified rheumatological illness which she reports is rheumatoid arthritis as well as systemic lupus and several other connective tissue disorder problems that she claims to have who presented to the [facility] emergency department on 07/24/2021 complaining of having either another lupus flareup or a new episode of shingles.  There was redness noted underneath the right breast, as well as under her right scapula, but there were no open lesions.  She was evaluated by the advanced practice provider she told the emergency room physicians that her shingles outbreaks usually resolve with steroids and Valtrex.  She is on chronic prednisone 10 mg p.o. daily for her connective tissue disorder.  She stated that she was under a lot of stress recently but denied any plans to kill her self.  Social work received a consult that the patient wished to discuss with social work prior to discharge.  The patient stated that she "did not want to be here".  The patient told her that her husband had passed away in 05/11/20 and she was all alone.  She stated that her stepfather passed away this previous 2023/02/11, and then another friend also recently passed.  She admitted at that time she had taken an overdose of Tylenol the Saturday prior to admission.  Psychiatric consultation was obtained.  She admitted to multiple issues of depression and the decision was made to admit her to the hospital for evaluation and  stabilization.  On examination today she continues to state that she does not want to be here.  She stated she is all alone.  She stated that she has had the love of her life passed away at age 76, that her ex husband had passed away, and then her most recent husband died in 05/11/20.  She stated that she had a child, but that her mother had taken her child away from her at a very young age, and would not return her to her custody.  She stated she is currently staying with her ex-husband's father, but apparently its next to some apartments that she also lives in.  She stated that she sometimes rotates between houses that her mother owns in separate states.  The electronic medical record revealed multiple hospital visits as well as hospitalizations in [place].  She stated that she had overdosed in the past after her daughter had been taken away from her, but that she had not been treated with antidepressant medicines at discharge.  She stated that she had been on multiple antidepressants in the past and she stated several names of all the basic antidepressants.  She did not state that she had ever been treated with duloxetine in the past.  She is currently on disability and unemployed.  She has poor social support.  She was admitted to the hospital for evaluation and stabilization.  Associated Signs/Symptoms: Depression Symptoms:  depressed mood, anhedonia, insomnia, psychomotor agitation, fatigue, feelings of worthlessness/guilt, difficulty concentrating, hopelessness, impaired memory, recurrent thoughts of death, suicidal thoughts without plan, suicidal attempt, anxiety, panic attacks, loss of energy/fatigue, disturbed sleep, Duration of Depression Symptoms: Greater than two  weeks  (Hypo) Manic Symptoms:  Impulsivity, Irritable Mood, Labiality of Mood, Anxiety Symptoms:  Excessive Worry, Psychotic Symptoms:   Denied PTSD Symptoms: Had a traumatic exposure:    In the past Total Time spent  with patient: 45 minutes  Past Psychiatric History: Patient admitted to 1 previous psychiatric hospitalization.  This was an overdose after apparently her mother had taken her child from her.  She stated the mother decided not to return the child to her custody.  She was unable to explain why.  She states she took an overdose at that time.  She stated that she was not discharged on any antidepressant medications.  She stated that she has been on multiple psychiatric medications in the past and none of been effective in most of led to side effects.  Is the patient at risk to self? Yes.    Has the patient been a risk to self in the past 6 months? Yes.    Has the patient been a risk to self within the distant past? Yes.    Is the patient a risk to others? No.  Has the patient been a risk to others in the past 6 months? No.  Has the patient been a risk to others within the distant past? No.   Prior Inpatient Therapy:   Prior Outpatient Therapy:    Alcohol Screening:   Substance Abuse History in the last 12 months:  No. Consequences of Substance Abuse: Negative Previous Psychotropic Medications: Yes  Psychological Evaluations: Yes  Past Medical History:  Past Medical History:  Diagnosis Date   Anemia    Anxiety    Arthritis    rheumatoid   Depression    Headache(784.0)    Hx of chlamydia infection 2012   Hx of gonorrhea 2011   Hx of trichomoniasis 2015   Lupus (HCC)    Lupus nephritis (HCC)    Osteonecrosis (HCC)    bilateral legs, worse in right hip.  D/t lupus   Pericardial effusion    Intermittent. Associated with lupus.    Past Surgical History:  Procedure Laterality Date   DILATION AND EVACUATION  11/10   ESOPHAGOGASTRODUODENOSCOPY Left 10/07/2016   Procedure: ESOPHAGOGASTRODUODENOSCOPY (EGD);  Surgeon: Dorena Cookey, MD;  Location: [facility];  Service: Gastroenterology;  Laterality: Left;   LAPAROSCOPIC TUBAL LIGATION  07/10/2011   Procedure: LAPAROSCOPIC TUBAL LIGATION;   Surgeon: Purcell Nails, MD;  Location: [facility];  Service: Gynecology;  Laterality: Bilateral;  fulguration   Family History:  Family History  Problem Relation Age of Onset   Hypertension Mother    Family Psychiatric  History: She stated that multiple family members have multiple psychiatric illnesses.  She stated that all of her family members hear voices, but there is "nothing wrong with that". Tobacco Screening:   Social History:  Social History   Substance and Sexual Activity  Alcohol Use Yes   Alcohol/week: 3.0 standard drinks   Types: 3 Shots of liquor per week     Social History   Substance and Sexual Activity  Drug Use Yes   Frequency: 21.0 times per week   Types: Marijuana   Comment: 3 times per day    Additional Social History:                           Allergies:   Allergies  Allergen Reactions   Alfalfa Hives and Other (See Comments)   Metoclopramide Hcl Anaphylaxis    Body  started contracting, couldn't breathe, throat swelled up   Alpha Blocker Quinazolines Hives and Nausea Only   Aspirin Other (See Comments)    Contraindicated with Lupus   Cyclobenzaprine Diarrhea   Elemental Sulfur Other (See Comments)    Flares up her lupus    Enoxaparin Other (See Comments)    "Spasms of muscles in hands and feet"   Garlic Diarrhea and Nausea And Vomiting   Nsaids Other (See Comments)    Flares up her lupus   Pork-Derived Products Diarrhea and Nausea And Vomiting   Tramadol Other (See Comments)    Lupus flare Drug interaction with kidney medicine   Ciprofloxacin Diarrhea and Nausea And Vomiting   Lab Results:  Results for orders placed or performed during the hospital encounter of 07/24/21 (from the past 48 hour(s))  Ethanol     Status: None   Collection Time: 07/24/21  5:57 PM  Result Value Ref Range   Alcohol, Ethyl (B) <10 <10 mg/dL    Comment: (NOTE) Lowest detectable limit for serum alcohol is 10 mg/dL.  For medical purposes  only. Performed at [facility], [address] [place], [place] [address]   CBC with Differential     Status: None   Collection Time: 07/24/21  6:52 PM  Result Value Ref Range   WBC 5.5 4.0 - 10.5 K/uL   RBC 4.72 3.87 - 5.11 MIL/uL   Hemoglobin 13.7 12.0 - 15.0 g/dL   HCT 03.4 74.2 - 59.5 %   MCV 91.3 80.0 - 100.0 fL   MCH 29.0 26.0 - 34.0 pg   MCHC 31.8 30.0 - 36.0 g/dL   RDW 63.8 75.6 - 43.3 %   Platelets 233 150 - 400 K/uL   nRBC 0.0 0.0 - 0.2 %   Neutrophils Relative % 76 %   Neutro Abs 4.2 1.7 - 7.7 K/uL   Lymphocytes Relative 20 %   Lymphs Abs 1.1 0.7 - 4.0 K/uL   Monocytes Relative 3 %   Monocytes Absolute 0.2 0.1 - 1.0 K/uL   Eosinophils Relative 0 %   Eosinophils Absolute 0.0 0.0 - 0.5 K/uL   Basophils Relative 1 %   Basophils Absolute 0.1 0.0 - 0.1 K/uL   nRBC 0 0 /100 WBC   Abs Immature Granulocytes 0.00 0.00 - 0.07 K/uL    Comment: Performed at [facility], [address], [place], [place] [address]  Comprehensive metabolic panel     Status: Abnormal   Collection Time: 07/24/21  6:52 PM  Result Value Ref Range   Sodium 137 135 - 145 mmol/L   Potassium 4.2 3.5 - 5.1 mmol/L   Chloride 102 98 - 111 mmol/L   CO2 26 22 - 32 mmol/L   Glucose, Bld 81 70 - 99 mg/dL    Comment: Glucose reference range applies only to samples taken after fasting for at least 8 hours.   BUN 11 6 - 20 mg/dL   Creatinine, Ser 8.41 0.44 - 1.00 mg/dL   Calcium 9.9 8.9 - 66.0 mg/dL   Total Protein 8.8 (H) 6.5 - 8.1 g/dL   Albumin 4.4 3.5 - 5.0 g/dL   AST 25 15 - 41 U/L   ALT 16 0 - 44 U/L   Alkaline Phosphatase 67 38 - 126 U/L   Total Bilirubin 1.1 0.3 - 1.2 mg/dL   GFR, Estimated >63 >01 mL/min    Comment: (NOTE) Calculated using the CKD-EPI Creatinine Equation (2021)    Anion gap 9  5 - 15    Comment: Performed at [facility], [address] [place], [place] [address]  I-Stat Beta hCG blood, ED ([facility], [facility], [facility] only)     Status: None   Collection Time: 07/24/21   6:59 PM  Result Value Ref Range   I-stat hCG, quantitative <5.0 <5 mIU/mL   Comment 3            Comment:   GEST. AGE      CONC.  (mIU/mL)   <=1 WEEK        5 - 50     2 WEEKS       50 - 500     3 WEEKS       100 - 10,000     4 WEEKS     1,000 - 30,000        FEMALE AND NON-PREGNANT FEMALE:     LESS THAN 5 mIU/mL   Resp Panel by RT-PCR (Flu A&B, Covid) Nasopharyngeal Swab     Status: None   Collection Time: 07/24/21  7:26 PM   Specimen: Nasopharyngeal Swab; Nasopharyngeal(NP) swabs in vial transport medium  Result Value Ref Range   SARS Coronavirus 2 by RT PCR NEGATIVE NEGATIVE    Comment: (NOTE) SARS-CoV-2 target nucleic acids are NOT DETECTED.  The SARS-CoV-2 RNA is generally detectable in upper respiratory specimens during the acute phase of infection. The lowest concentration of SARS-CoV-2 viral copies this assay can detect is 138 copies/mL. A negative result does not preclude SARS-Cov-2 infection and should not be used as the sole basis for treatment or other patient management decisions. A negative result may occur with  improper specimen collection/handling, submission of specimen other than nasopharyngeal swab, presence of viral mutation(s) within the areas targeted by this assay, and inadequate number of viral copies(<138 copies/mL). A negative result must be combined with clinical observations, patient history, and epidemiological information. The expected result is Negative.  Fact Sheet for Patients:  BloggerCourse.com  Fact Sheet for Healthcare Providers:  SeriousBroker.it  This test is no t yet approved or cleared by the [place] FDA and  has been authorized for detection and/or diagnosis of SARS-CoV-2 by FDA under an Emergency Use Authorization (EUA). This EUA will remain  in effect (meaning this test can be used) for the duration of the COVID-19 declaration under Section 564(b)(1) of the Act,  21 U.S.C.section 360bbb-3(b)(1), unless the authorization is terminated  or revoked sooner.       Influenza A by PCR NEGATIVE NEGATIVE   Influenza B by PCR NEGATIVE NEGATIVE    Comment: (NOTE) The Xpert Xpress SARS-CoV-2/FLU/RSV plus assay is intended as an aid in the diagnosis of influenza from Nasopharyngeal swab specimens and should not be used as a sole basis for treatment. Nasal washings and aspirates are unacceptable for Xpert Xpress SARS-CoV-2/FLU/RSV testing.  Fact Sheet for Patients: BloggerCourse.com  Fact Sheet for Healthcare Providers: SeriousBroker.it  This test is not yet approved or cleared by the [place] FDA and has been authorized for detection and/or diagnosis of SARS-CoV-2 by FDA under an Emergency Use Authorization (EUA). This EUA will remain in effect (meaning this test can be used) for the duration of the COVID-19 declaration under Section 564(b)(1) of the Act, 21 U.S.C. section 360bbb-3(b)(1), unless the authorization is terminated or revoked.  Performed at [facility], [address] [place], [place] [address]   Urinalysis, Routine w reflex microscopic Urine, Clean Catch     Status: Abnormal  Collection Time: 07/24/21  7:41 PM  Result Value Ref Range   Color, Urine AMBER (A) YELLOW    Comment: BIOCHEMICALS MAY BE AFFECTED BY COLOR   APPearance CLOUDY (A) CLEAR   Specific Gravity, Urine 1.023 1.005 - 1.030   pH 7.0 5.0 - 8.0   Glucose, UA NEGATIVE NEGATIVE mg/dL   Hgb urine dipstick NEGATIVE NEGATIVE   Bilirubin Urine NEGATIVE NEGATIVE   Ketones, ur 20 (A) NEGATIVE mg/dL   Protein, ur 425 (A) NEGATIVE mg/dL   Nitrite NEGATIVE NEGATIVE   Leukocytes,Ua NEGATIVE NEGATIVE   WBC, UA 0-5 0 - 5 WBC/hpf   Bacteria, UA FEW (A) NONE SEEN   Squamous Epithelial / LPF 0-5 0 - 5   Mucus PRESENT     Comment: Performed at [facility], [address], [place], [place] [address]    Blood  Alcohol level:  Lab Results  Component Value Date   [facility] <10 07/24/2021   ETH <11 09/16/2013    Metabolic Disorder Labs:   No results found for: PROLACTIN No results found for: CHOL, TRIG, HDL, CHOLHDL, VLDL, LDLCALC  Current Medications: Current Facility-Administered Medications  Medication Dose Route Frequency Provider Last Rate Last Admin   acetaminophen (TYLENOL) tablet 650 mg  650 mg Oral Q6H PRN Antonieta Pert, MD       alum & mag hydroxide-simeth (MAALOX/MYLANTA) 200-200-20 MG/5ML suspension 30 mL  30 mL Oral Q4H PRN Antonieta Pert, MD       DULoxetine (CYMBALTA) DR capsule 30 mg  30 mg Oral Daily Antonieta Pert, MD       gabapentin (NEURONTIN) capsule 300 mg  300 mg Oral TID Antonieta Pert, MD       HYDROcodone-acetaminophen (NORCO/VICODIN) 5-325 MG per tablet 1-2 tablet  1-2 tablet Oral Q6H PRN Antonieta Pert, MD       hydroxychloroquine (PLAQUENIL) tablet 200 mg  200 mg Oral BID Antonieta Pert, MD       hydrOXYzine (ATARAX/VISTARIL) tablet 25 mg  25 mg Oral TID PRN Antonieta Pert, MD       magnesium hydroxide (MILK OF MAGNESIA) suspension 30 mL  30 mL Oral Daily PRN Antonieta Pert, MD       ondansetron (ZOFRAN-ODT) disintegrating tablet 8 mg  8 mg Oral Q8H PRN Antonieta Pert, MD       pantoprazole (PROTONIX) EC tablet 40 mg  40 mg Oral Daily Antonieta Pert, MD       predniSONE (DELTASONE) tablet 30 mg  30 mg Oral Q breakfast Antonieta Pert, MD       Followed by   Melene Muller ON 07/27/2021] predniSONE (DELTASONE) tablet 25 mg  25 mg Oral Q breakfast Antonieta Pert, MD       Followed by   Melene Muller ON 07/28/2021] predniSONE (DELTASONE) tablet 20 mg  20 mg Oral Q breakfast Antonieta Pert, MD       Followed by   Melene Muller ON 07/29/2021] predniSONE (DELTASONE) tablet 15 mg  15 mg Oral Q breakfast Antonieta Pert, MD       Followed by   Melene Muller ON 07/30/2021] predniSONE (DELTASONE) tablet 10 mg  10 mg Oral Q breakfast Antonieta Pert, MD        Melene Muller ON 07/31/2021] predniSONE (DELTASONE) tablet 10 mg  10 mg Oral Q breakfast Antonieta Pert, MD       traZODone (DESYREL) tablet 50 mg  50 mg Oral QHS PRN Pricsilla Lindvall,  Marlane Mingle, MD       valACYclovir Ralph Dowdy) tablet 1,000 mg  1,000 mg Oral TID Antonieta Pert, MD       PTA Medications: Medications Prior to Admission  Medication Sig Dispense Refill Last Dose   predniSONE (DELTASONE) 10 MG tablet Take 10 mg by mouth daily with breakfast.      hydroxychloroquine (PLAQUENIL) 200 MG tablet Take 1 tablet (200 mg total) by mouth daily. (Patient taking differently: Take 400 mg by mouth daily.) 30 tablet 2    oxyCODONE-acetaminophen (PERCOCET) 5-325 MG tablet Take 1 tablet by mouth every 6 (six) hours as needed. 12 tablet 0    predniSONE (DELTASONE) 20 MG tablet Take 60 mg daily x 2 days then 40 mg daily x 2 days then 20 mg daily x 2 days (Patient not taking: Reported on 07/26/2021) 12 tablet 0 Completed Course   valACYclovir (VALTREX) 1000 MG tablet Take 1 tablet (1,000 mg total) by mouth 3 (three) times daily. (Patient not taking: Reported on 07/26/2021) 21 tablet 0 Not Taking    Musculoskeletal: Strength & Muscle Tone: decreased Gait & Station: shuffle Patient leans: N/A            Psychiatric Specialty Exam:  Presentation  General Appearance: Disheveled  Eye Contact:Minimal  Speech:Normal Rate  Speech Volume:Decreased  Handedness:Right   Mood and Affect  Mood:Depressed  Affect:Congruent   Thought Process  Thought Processes:Goal Directed  Duration of Psychotic Symptoms: No data recorded Past Diagnosis of Schizophrenia or Psychoactive disorder: No  Descriptions of Associations:Circumstantial  Orientation:Full (Time, Place and Person)  Thought Content:Logical; Rumination  Hallucinations:Hallucinations: None  Ideas of Reference:None  Suicidal Thoughts:Suicidal Thoughts: Yes, Passive SI Passive Intent and/or Plan: Without Intent; Without  Plan  Homicidal Thoughts:Homicidal Thoughts: No   Sensorium  Memory:Immediate Poor; Recent Poor; Remote Poor  Judgment:Impaired  Insight:Lacking   Executive Functions  Concentration: No data recorded Attention Span:Fair  Recall:Poor  Fund of Knowledge:Fair  Language:Good   Psychomotor Activity  Psychomotor Activity:Psychomotor Activity: Increased   Assets  Assets:Desire for Improvement; Resilience   Sleep  Sleep:Sleep: Poor    Physical Exam: Physical Exam Vitals and nursing note reviewed.  HENT:     Head: Normocephalic and atraumatic.  Pulmonary:     [place]: Pulmonary effort is normal.  Neurological:     General: No focal deficit present.     Mental Status: She is alert and oriented to person, place, and time.   Review of Systems  Constitutional:  Positive for malaise/fatigue.  Musculoskeletal:  Positive for back pain, joint pain, myalgias and neck pain.  Neurological:  Positive for tingling, sensory change and weakness.  Psychiatric/Behavioral:  Positive for depression and suicidal ideas. The patient is nervous/anxious and has insomnia.   All other systems reviewed and are negative. There were no vitals taken for this visit. There is no height or weight on file to calculate BMI.  Treatment Plan Summary: Patient is seen and examined.  Patient is a 40 year old female with the above-stated past psychiatric history who was admitted secondary to death thoughts.  She will be admitted to the hospital.  She will be integrated in the milieu.  She will be encouraged to attend groups.  She has not been treated with Cymbalta in the past, and I am going to start her on 30 mg p.o. daily.  Hopefully this will help her peripheral nerve pain as well as her fibromyalgia.  She is on chronic steroid therapy, but had been blasted with steroids in the  emergency room because of the possible shingles.  The areas that I were able to examine today showed erythema, but no open  lesions.  Hopefully the Valtrex and steroid dosage increase will be beneficial.  She is on chronic Valtrex therapy as well for genital herpes.  She stated that she is currently taking Plaquenil, and we will confirm that dosage.  I have continue the Valtrex at 1000 mg p.o. 3 times daily and a steroid taper.  Clearly long-term steroid therapy if she continues to get blast evident for her rheumatological illness could easily exacerbate depressive symptoms.  We discussed her chronic pain issues, and she stated that her rheumatological and primary care doctors will not give her any pain medicines because "you are going to be in pain for the rest your life".  I asked her whether or not she had been referred to a chronic pain clinic and she denied that.  She ruminated on the fact that she had to eat white bread, that the rice and fish was cold, and that she just wants to be comfortable here in the hospital.  Review of her admission laboratories revealed a normal creatinine at 0.70.  Liver function enzymes were normal.  She shows no evidence of anemia or lymphopenia that would be included with lupus.  MCV is normal.  Platelets are normal.  Differential was normal.  She does not appear to have a malar rash.  Beta-hCG was negative.  Respiratory panel was negative.  Despite having stated that she had taken a Tylenol overdose it does not appear that they did a Tylenol level.  We will obtain 1 of those stat.  Her liver function enzymes are normal and that somewhat reassuring.  Her urinalysis showed 20 mg per DL of ketones and 161 mg per DL of protein.  There were few bacteria but no white blood cells.  A plain x-ray of her pelvis was obtained bilaterally and it showed bilateral hip replacements that were in position properly.  Her EKG showed a normal sinus rhythm with a normal QTc interval.  Drug screen was not obtained and I will also order that stat.  Review of the electronic medical record revealed the [facility] [place]  note that she had been diagnosed with systemic lupus erythematosus in October 2010.  She also had an episode apparently of lupus nephritis in 08/05/2008.  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  She also apparently has had a pericardial effusion as well as tamponade with that effusion.  I have also added gabapentin 300 mg p.o. 3 times daily for chronic pain issues, hydrocodone 5/325 1 to 2 tablets p.o. every 6 hours as needed pain.  Also a drug screen is not been obtained, and we will order that.  Her vital signs are stable, she is afebrile.  Observation Level/Precautions:  15 minute checks  Laboratory:  CBC Chemistry Profile HCG UDS UA  Psychotherapy:    Medications:    Consultations:    Discharge Concerns:    Estimated LOS:  Other:     Physician Treatment Plan for Primary Diagnosis: <principal problem not specified> Long Term Goal(s): Improvement in symptoms so as ready for discharge  Short Term Goals: Ability to identify changes in lifestyle to reduce recurrence of condition will improve, Ability to verbalize feelings will improve, Ability to disclose and discuss suicidal ideas, Ability to demonstrate self-control will improve,  Ability to identify and develop effective coping behaviors will improve, and Ability to maintain clinical measurements within normal limits will improve  Physician Treatment Plan for Secondary Diagnosis: Active Problems:   Depression  Long Term Goal(s): Improvement in symptoms so as ready for discharge  Short Term Goals: Ability to identify changes in lifestyle to reduce recurrence of condition will improve, Ability to verbalize feelings will improve, Ability to disclose and discuss suicidal ideas, Ability to demonstrate self-control will improve, Ability to identify and develop effective coping behaviors will improve, and Ability to maintain clinical  measurements within normal limits will improve  I certify that inpatient services furnished can reasonably be expected to improve the patient's condition.    Antonieta Pert, MD 7/27/20222:57 PM

## 2021-07-26 NOTE — ED Notes (Signed)
Refused V/S, to be taken.

## 2021-07-26 NOTE — ED Notes (Signed)
Pt given crayons and coloring pages 

## 2021-07-26 NOTE — ED Notes (Signed)
Ordered Boiled Eggs, Fruit, and Wheat Bread, for Breakfast.

## 2021-07-26 NOTE — Progress Notes (Signed)
Patient is a  40 year old female who voluntarily presented to [facility] on 07/26/21 from [facility] after complaining of having another lupus flare up. Pt told SW that she didn't want to be here. Patient presents with irritable mood and arguementative. Pt refused skin search but allowed staff to search her clothes.Patient endorses SI. Pt denies HI at this time. Patient also denies AH/VH. Provided positive reinforcement and encouragement. Patient cooperative and receptive to efforts. Patient remains safe on the unit.

## 2021-07-26 NOTE — ED Notes (Addendum)
Patient complaining about breakfast tray stating she cannot eat anything on the tray. Patient offered boiled eggs and fruit cup, patient continues to shout over nurse complaining about breakfast tray and demanding to "speak to whoever is in charge".

## 2021-07-27 LAB — RAPID URINE DRUG SCREEN, HOSP PERFORMED
Amphetamines: NOT DETECTED
Barbiturates: NOT DETECTED
Benzodiazepines: NOT DETECTED
Cocaine: NOT DETECTED
Opiates: POSITIVE — AB
Tetrahydrocannabinol: POSITIVE — AB

## 2021-07-27 LAB — LIPID PANEL
Cholesterol: 164 mg/dL (ref 0–200)
HDL: 89 mg/dL (ref >40)
LDL Cholesterol: 64 mg/dL (ref 0–99)
Total CHOL/HDL Ratio: 1.8 RATIO
Triglycerides: 55 mg/dL (ref <150)
VLDL: 11 mg/dL (ref 0–40)

## 2021-07-27 LAB — TSH: TSH: 0.235 u[IU]/mL — ABNORMAL LOW (ref 0.350–4.500)

## 2021-07-27 LAB — ACETAMINOPHEN LEVEL: Acetaminophen (Tylenol), Serum: <10 ug/mL — ABNORMAL LOW (ref 10–30)

## 2021-07-27 LAB — PREGNANCY, URINE: Preg Test, Ur: NEGATIVE

## 2021-07-27 LAB — HCG, QUANTITATIVE, PREGNANCY: hCG, Beta Chain, Quant, S: 2 m[IU]/mL (ref <5)

## 2021-07-27 MED ORDER — ENSURE ENLIVE PO LIQD
237.0000 mL | Freq: Two times a day (BID) | ORAL | Status: DC
  Administered 2021-07-27 – 2021-07-30 (×5): 237 mL via ORAL
  Filled 2021-07-27 (×9): qty 237

## 2021-07-27 MED ORDER — LIDOCAINE HCL URETHRAL/MUCOSAL 2 % EX GEL
1.0000 "application " | Freq: Two times a day (BID) | CUTANEOUS | Status: DC | PRN
  Administered 2021-07-29: 1 via TOPICAL
  Filled 2021-07-27 (×3): qty 5
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
## 2021-07-27 NOTE — BHH Group Notes (Signed)
Patient did not attend morning goal setting group. 

## 2021-07-27 NOTE — Progress Notes (Signed)
Patient did not attend warap up group.

## 2021-07-27 NOTE — Progress Notes (Signed)
   07/27/21 2122  Psych Admission Type (Psych Patients Only)  Admission Status Voluntary  Psychosocial Assessment  Patient Complaints Depression  Eye Contact Brief  Facial Expression Flat;Sullen  Affect Flat;Sullen;Sad  Speech Logical/coherent  Interaction Minimal;Guarded  Motor Activity Other (Comment) (WDL)  Appearance/Hygiene In scrubs  Behavior Characteristics Cooperative;Appropriate to situation  Mood Depressed;Sullen;Sad  Thought Process  Coherency WDL  Content WDL  Delusions None reported or observed  Perception WDL  Hallucination None reported or observed  Judgment Impaired  Confusion None  Danger to Self  Current suicidal ideation? Passive  Danger to Others  Danger to Others None reported or observed

## 2021-07-27 NOTE — BHH Suicide Risk Assessment (Signed)
B[facility] INPATIENT:  Family/Significant Other Suicide Prevention Education  Suicide Prevention Education:  Education Completed; Evelyn [name],  brother(name of family member/significant other) has been identified by the patient as the family member/significant other with whom the patient will be residing, and identified as the person(s) who will aid the patient in the event of a mental health crisis (suicidal ideations/suicide attempt).  With written consent from the patient, the family member/significant other has been provided the following suicide prevention education, prior to the and/or following the discharge of the patient.  CSW spoke with patient brother regarding patient. Brother reports that he lives in [place] and will come to [place] every once in a while and visit. Brother reports that he knew patient went to hospital but was unsure what was going on.  Brother reports that patient gets involved in things that she shouldn't be involved in. Brother reports that patient uses many different substances but was unable to clarify which substances she uses. Brother reports that patient does live in her own place with a roommate.  He reports that she hangs out with the wrong crowd and will act very differently than when she interacts with family.  To brother's knowledge, patient does not have access to firearms but he could not confirm that. Brother reports that patient has several doctors that are still in [place] and she will never follow up with them. Brother reports that he will offer to come and get her but she will not follow through. Brother reports that he does not feel like patient will follow up with outpatient providers to get the help she needs.  Brother reports that she has always been like that and is unsure the best treatment plan for her.   The suicide prevention education provided includes the following: Suicide risk factors Suicide prevention and interventions National Suicide Hotline  telephone number [facility] assessment telephone number [facility] Emergency Assistance [phone] [facility] and/or Residential Mobile Crisis Unit telephone number  Request made of family/significant other to: Remove weapons (e.g., guns, rifles, knives), all items previously/currently identified as safety concern.   Remove drugs/medications (over-the-counter, prescriptions, illicit drugs), all items previously/currently identified as a safety concern.  The family member/significant other verbalizes understanding of the suicide prevention education information provided.  The family member/significant other agrees to remove the items of safety concern listed above.  Muhanad Torosyan E Asahel Risden 07/27/2021, 2:57 PM

## 2021-07-27 NOTE — Progress Notes (Signed)
NUTRITION ASSESSMENT  RD consulted for pt with multiple food allergies.  INTERVENTION: 1. Supplements: Ensure Enlive po BID, each supplement provides 350 kcal and 20 grams of protein   NUTRITION DIAGNOSIS: Unintentional weight loss related to sub-optimal intake as evidenced by pt report.   Goal: Pt to meet >/= 90% of their estimated nutrition needs.  Monitor:  PO intake  Assessment:  Pt admitted for depression. RD was consulted concerning pt's food allergies to pork products, alfafa and garlic. Pt receiving trays from kitchen and consuming them. Will order Ensure supplements in case options are limited from cafeteria. Snacks are also provided on unit as well.  No weight for admission to assess weight status.   Height: Ht Readings from Last 1 Encounters:  07/26/21 5\' 5"  (1.651 m)    Weight: Wt Readings from Last 1 Encounters:  12/30/20 53.1 kg    Weight Hx: Wt Readings from Last 10 Encounters:  12/30/20 53.1 kg  04/18/20 54.5 kg  04/02/20 49.9 kg  11/21/19 41.7 kg  02/27/19 55.8 kg  01/10/19 55.8 kg  08/14/18 57.6 kg  01/21/18 55.8 kg  11/14/17 62.1 kg  08/16/17 65.8 kg    BMI:  Body mass index is 19.47 kg/m. Pt meets criteria for normal based on current BMI.  Estimated Nutritional Needs: Kcal: 25-30 kcal/kg Protein: > 1 gram protein/kg Fluid: 1 ml/kcal  Diet Order:  Diet Order             Diet regular Room service appropriate? Yes; Fluid consistency: Thin  Diet effective now                  Pt is also offered choice of unit snacks mid-morning and mid-afternoon.  Pt is eating as desired.   Lab results and medications reviewed.   08/18/17, MS, RD, LDN Inpatient Clinical Dietitian Contact information available via Amion

## 2021-07-27 NOTE — Progress Notes (Signed)
   07/26/21 2131  Psych Admission Type (Psych Patients Only)  Admission Status Voluntary  Psychosocial Assessment  Patient Complaints Anxiety;Depression  Eye Contact Brief;Avertive  Facial Expression Flat  Affect Flat  Speech Logical/coherent  Interaction Minimal;Guarded  Motor Activity Other (Comment) (WDL)  Appearance/Hygiene UTA  Behavior Characteristics Cooperative;Appropriate to situation  Mood Depressed  Thought Process  Coherency WDL  Content WDL  Delusions None reported or observed  Perception WDL  Hallucination None reported or observed  Judgment Impaired  Confusion None  Danger to Self  Current suicidal ideation? Denies  Danger to Others  Danger to Others None reported or observed

## 2021-07-27 NOTE — Progress Notes (Signed)
[facility] MD Progress Note  07/27/2021 5:47 PM Evelyn [name]  MRN:  [id number] Subjective:  Evelyn [name] is a 40 y.o. female, with reported past psychiatric history of depression. Patient presents voluntarily to [facility] (07/26/2021), transferred from [facility] ED (07/24/2021) for suicide ideation and MDD. [facility] stay day 1.   Last night:  Patient is compliant with scheduled meds. PRNs: Vistaril and Vicodin  Today (07/27/2021): Patient was was laying in bed wrapped in her blanket, with her eyes closed and was teary.  Patient stated that what she recently ate, was not agreeing with her.  Stated that she vomited 3 times, but would not answer the quantity of the vomit. Patient slept Number of Hours: 3.75. Stated that it was " not good", patient had issues falling asleep and staying asleep. Patient stated appetite was " good", and that after she vomited, she has an appetite again. When asked the patient what brought her in, patient stated that she attempted to overdose with Tylenol last week because she is lonely.  Patient did not mention her shingles.  Patient stated that she is incredibly lonely due to recent death of her husband, father, and favorite cousin.  Patient stated that she "do not have a mood", that she is just thinking about her deceased husband.  Patient then mentions that she wants a baby, but is unable because she had her tubes tied.  Patient stated that she does not like the way the gabapentin makes her feel, and wants to discontinue it.  Patient said that she never had history of seizures.   Principal Problem: Depression Diagnosis: Principal Problem:   Depression  Total Time spent with patient: 30 minutes  Past Psychiatric History:  Patient admitted to 1 previous psychiatric hospitalization.  This was an overdose after apparently her mother had taken her child from her.  She stated the mother decided not to return the child to her custody.   She was unable to explain why.  She states she took an overdose at that time.  She stated that she was not discharged on any antidepressant medications.  She stated that she has been on multiple psychiatric medications in the past and none of been effective in most of led to side effects.   Past Medical History:  Past Medical History:  Diagnosis Date   Anemia    Anxiety    Arthritis    rheumatoid   Depression    Headache(784.0)    Hx of chlamydia infection 2012   Hx of gonorrhea 2011   Hx of trichomoniasis 2015   Lupus (HCC)    Lupus nephritis (HCC)    Osteonecrosis (HCC)    bilateral legs, worse in right hip.  D/t lupus   Pericardial effusion    Intermittent. Associated with lupus.    Past Surgical History:  Procedure Laterality Date   DILATION AND EVACUATION  11/10   ESOPHAGOGASTRODUODENOSCOPY Left 10/07/2016   Procedure: ESOPHAGOGASTRODUODENOSCOPY (EGD);  Surgeon: Dorena Cookey, MD;  Location: [facility];  Service: Gastroenterology;  Laterality: Left;   LAPAROSCOPIC TUBAL LIGATION  07/10/2011   Procedure: LAPAROSCOPIC TUBAL LIGATION;  Surgeon: Purcell Nails, MD;  Location: [facility];  Service: Gynecology;  Laterality: Bilateral;  fulguration   Family History:  Family History  Problem Relation Age of Onset   Hypertension Mother    Family Psychiatric  History:  She stated that multiple family members have multiple psychiatric illnesses.  She stated that all of her family members hear voices,  but there is "nothing wrong with that".  Social History:  Social History   Substance and Sexual Activity  Alcohol Use Yes   Alcohol/week: 3.0 standard drinks   Types: 3 Shots of liquor per week     Social History   Substance and Sexual Activity  Drug Use Yes   Frequency: 21.0 times per week   Types: Marijuana   Comment: 3 times per day    Social History   Socioeconomic History   Marital status: Single    Spouse name: Not on file   Number of children: Not on file   Years  of education: Not on file   Highest education level: Not on file  Occupational History   Not on file  Tobacco Use   Smoking status: Every Day    Packs/day: 0.25    Years: 7.00    Pack years: 1.75    Types: Cigarettes   Smokeless tobacco: Never  Vaping Use   Vaping Use: Not on file  Substance and Sexual Activity   Alcohol use: Yes    Alcohol/week: 3.0 standard drinks    Types: 3 Shots of liquor per week   Drug use: Yes    Frequency: 21.0 times per week    Types: Marijuana    Comment: 3 times per day   Sexual activity: Yes    Birth control/protection: Condom  Other Topics Concern   Not on file  Social History Narrative   Not on file   Social Determinants of Health   Financial Resource Strain: Not on file  Food Insecurity: Not on file  Transportation Needs: Not on file  Physical Activity: Not on file  Stress: Not on file  Social Connections: Not on file   Additional Social History:  Currently patient lives alone.  Patient stated that she can stay with her deceased husband's family that are supportive, but stated that she feels "not right". Besides her deceased husband's family, patient stated she has no other relations or social support in [place].  Stated that she is actually from [place], and that she originally moved here to live with her deceased husband. Patient is currently on disability and unemployed. Patient stated that she has 1 child, but does not have custody of her.  Sleep: Poor  Appetite:  Fair  Current Medications: Current Facility-Administered Medications  Medication Dose Route Frequency Provider Last Rate Last Admin   acetaminophen (TYLENOL) tablet 650 mg  650 mg Oral Q6H PRN Antonieta Pert, MD       alum & mag hydroxide-simeth (MAALOX/MYLANTA) 200-200-20 MG/5ML suspension 30 mL  30 mL Oral Q4H PRN Antonieta Pert, MD       DULoxetine (CYMBALTA) DR capsule 30 mg  30 mg Oral Daily Antonieta Pert, MD   30 mg at 07/27/21 7124    feeding supplement (ENSURE ENLIVE / ENSURE PLUS) liquid 237 mL  237 mL Oral BID BM Antonieta Pert, MD   237 mL at 07/27/21 1501   HYDROcodone-acetaminophen (NORCO/VICODIN) 5-325 MG per tablet 1-2 tablet  1-2 tablet Oral Q6H PRN Antonieta Pert, MD   1 tablet at 07/27/21 0036   hydroxychloroquine (PLAQUENIL) tablet 200 mg  200 mg Oral BID Antonieta Pert, MD   200 mg at 07/27/21 1735   hydrOXYzine (ATARAX/VISTARIL) tablet 25 mg  25 mg Oral TID PRN Antonieta Pert, MD   25 mg at 07/27/21 1503   magnesium hydroxide (MILK OF MAGNESIA) suspension 30 mL  30 mL Oral  Daily PRN Antonieta Pert, MD       ondansetron (ZOFRAN-ODT) disintegrating tablet 8 mg  8 mg Oral Q8H PRN Antonieta Pert, MD       pantoprazole (PROTONIX) EC tablet 40 mg  40 mg Oral Daily Antonieta Pert, MD   40 mg at 07/27/21 1504   [START ON 07/31/2021] predniSONE (DELTASONE) tablet 10 mg  10 mg Oral Q breakfast Antonieta Pert, MD       Melene Muller ON 07/29/2021] predniSONE (DELTASONE) tablet 15 mg  15 mg Oral Q breakfast Antonieta Pert, MD       traZODone (DESYREL) tablet 50 mg  50 mg Oral QHS PRN Antonieta Pert, MD       valACYclovir (VALTREX) tablet 1,000 mg  1,000 mg Oral TID Antonieta Pert, MD   1,000 mg at 07/27/21 1734   ziprasidone (GEODON) injection 10 mg  10 mg Intramuscular Q8H PRN Armandina Stammer I, NP        Lab Results: No results found for this or any previous visit (from the past 48 hour(s)).  Blood Alcohol level:  Lab Results  Component Value Date   [facility] <10 07/24/2021   ETH <11 09/16/2013    Metabolic Disorder Labs: Lab Results  Component Value Date   HGBA1C 5.3 01/13/2020   MPG (H) 04/14/2010    137 ** Please note change in reference range(s). **   MPG 120 03/30/2010   No results found for: PROLACTIN No results found for: CHOL, TRIG, HDL, CHOLHDL, VLDL, LDLCALC   Musculoskeletal: Strength & Muscle Tone: within normal limits Gait & Station:  Was not able to assess today due  to patient lying in bed. Patient leans: N/A  Psychiatric Specialty Exam:  Presentation  General Appearance: Disheveled  Eye Contact:Minimal  Speech:Clear and Coherent; Normal Rate  Speech Volume:Decreased  Handedness:Right   Mood and Affect  Mood:Depressed; Hopeless  Affect:Congruent; Depressed; Tearful   Thought Process  Thought Processes:Coherent; Goal Directed; Linear  Descriptions of Associations:Circumstantial  Orientation:Full (Time, Place and Person)  Thought Content:Logical  History of Schizophrenia/Schizoaffective disorder:No  Duration of Psychotic Symptoms:No data recorded Hallucinations:Hallucinations: Auditory; Visual Description of Auditory Hallucinations: Patient stated that the "ghosts" are conversational to her, and do not bother her. Description of Visual Hallucinations: Patient reported seeing ghosts of her dead relatives.  Stated she has been able to see them her whole life.  Stated that she is indifferent about them, that they do not bring her comfort.  Ideas of Reference:None  Suicidal Thoughts:Suicidal Thoughts: No (Patient denied thoughts of harming herself or suicide.  Stated that she just wishes to be with her deceased ex-husband.) SI Passive Intent and/or Plan: Without Intent; Without Plan  Homicidal Thoughts:Homicidal Thoughts: No   Sensorium  Memory:Immediate Good; Recent Good; Remote Fair  Judgment:Poor  Insight:Poor   Executive Functions  Concentration:Fair  Attention Span:Fair  Recall:Fair  Fund of Knowledge:Fair  Language:Good   Psychomotor Activity  Psychomotor Activity:Psychomotor Activity: Normal   Assets  Assets:Desire for Improvement   Sleep  Sleep:Sleep: Poor Number of Hours of Sleep: 3.75    Physical Exam: Physical Exam Vitals and nursing note reviewed.  HENT:     Head: Normocephalic and atraumatic.  Pulmonary:     Effort: Pulmonary effort is normal.  Neurological:     Mental Status: She is  alert and oriented to person, place, and time.   Review of Systems  Eyes:  Negative for blurred vision.  Respiratory:  Negative for cough and shortness  of breath.   Cardiovascular:  Negative for chest pain.  Gastrointestinal:  Positive for nausea and vomiting (Patient stated that the food she recently ate did not agree with her.  Stated that she ended throwing up 3 times though patient did not explain how much.). Negative for constipation and diarrhea.  Neurological:  Positive for headaches (Patient reported diffuse intermittent headaches.). Negative for dizziness and weakness.  Blood pressure 103/68, pulse 93, temperature 98.1 F (36.7 C), temperature source Oral, resp. rate 20, height 5\' 5"  (1.651 m), SpO2 100 %. Body mass index is 19.47 kg/m.   Treatment Plan Summary: Daily contact with patient to assess and evaluate symptoms and progress in treatment and Medication management  Psychiatric Diagnoses and Treatments:  MDD -Continue Cymbalta DR 30 mg p.o. daily   Medical issues being addressed:  SLE -Continue Plaquenil 200 mg p.o. twice daily -Continue prednisone taper   Shingles and herpes -Continue Valtrex 100 mg p.o. 3 times daily -Discontinue gabapentin 300 mg p.o. 3 times daily because patient stated that it made her feel weird  PRN's -Trazadone 50mg  PO qHS PRN for insomnia -Hydroxyzine 25mg  PO TID PRN for anxiety -Alum & mag hydroxide-simeth 77ml PO qHS PRN for GERD -Magnesium hydroxide 65ml PO daily PRN for constipation -Acetaminophen tablet 650mg  PO PRN q6hrs for mild pain    Princess Bruins, DO, PGY-1 07/27/2021, 5:47 PM

## 2021-07-28 LAB — T4, FREE: Free T4: 0.93 ng/dL (ref 0.61–1.12)

## 2021-07-28 NOTE — Progress Notes (Signed)
Pt came to the NS and asked to speak to Admin. Pt was very upset and was notified that at 11am she will be seen.

## 2021-07-28 NOTE — Progress Notes (Signed)
Patient stayed in bed most of the day.  Refused morning medications, then later in the day, took her medications. Respirations even and unlabored.  No signs/symptoms of pain/distress noted on patient's face/body movements. Safety maintained with 15 minute checks.

## 2021-07-28 NOTE — Tx Team (Addendum)
Interdisciplinary Treatment and Diagnostic Plan Update  07/28/2021 Time of Session: 9am Evelyn [name] MRN: [id number]  Principal Diagnosis: Depression  Secondary Diagnoses: Principal Problem:   Depression   Current Medications:  Current Facility-Administered Medications  Medication Dose Route Frequency Provider Last Rate Last Admin   acetaminophen (TYLENOL) tablet 650 mg  650 mg Oral Q6H PRN Sharma Covert, MD       alum & mag hydroxide-simeth (MAALOX/MYLANTA) 200-200-20 MG/5ML suspension 30 mL  30 mL Oral Q4H PRN Sharma Covert, MD       DULoxetine (CYMBALTA) DR capsule 30 mg  30 mg Oral Daily Sharma Covert, MD   30 mg at 07/28/21 0900   feeding supplement (ENSURE ENLIVE / ENSURE PLUS) liquid 237 mL  237 mL Oral BID BM Sharma Covert, MD   237 mL at 07/27/21 1501   HYDROcodone-acetaminophen (NORCO/VICODIN) 5-325 MG per tablet 1-2 tablet  1-2 tablet Oral Q6H PRN Sharma Covert, MD   1 tablet at 07/27/21 0036   hydroxychloroquine (PLAQUENIL) tablet 200 mg  200 mg Oral BID Sharma Covert, MD   200 mg at 07/28/21 0900   hydrOXYzine (ATARAX/VISTARIL) tablet 25 mg  25 mg Oral TID PRN Sharma Covert, MD   25 mg at 07/27/21 2122   lidocaine (XYLOCAINE) 2 % jelly 1 application  1 application Topical BID PRN Sharma Covert, MD       magnesium hydroxide (MILK OF MAGNESIA) suspension 30 mL  30 mL Oral Daily PRN Sharma Covert, MD       ondansetron (ZOFRAN-ODT) disintegrating tablet 8 mg  8 mg Oral Q8H PRN Sharma Covert, MD       pantoprazole (PROTONIX) EC tablet 40 mg  40 mg Oral Daily Sharma Covert, MD   40 mg at 07/28/21 0900   [START ON 07/31/2021] predniSONE (DELTASONE) tablet 10 mg  10 mg Oral Q breakfast Sharma Covert, MD       Derrill Memo ON 07/29/2021] predniSONE (DELTASONE) tablet 15 mg  15 mg Oral Q breakfast Sharma Covert, MD       traZODone (DESYREL) tablet 50 mg  50 mg Oral QHS PRN Sharma Covert, MD   50 mg at 07/27/21 2122    valACYclovir (VALTREX) tablet 1,000 mg  1,000 mg Oral TID Sharma Covert, MD   1,000 mg at 07/28/21 0900   ziprasidone (GEODON) injection 10 mg  10 mg Intramuscular Q8H PRN Lindell Spar I, NP       PTA Medications: Medications Prior to Admission  Medication Sig Dispense Refill Last Dose   predniSONE (DELTASONE) 10 MG tablet Take 10 mg by mouth daily with breakfast.      hydroxychloroquine (PLAQUENIL) 200 MG tablet Take 1 tablet (200 mg total) by mouth daily. (Patient taking differently: Take 400 mg by mouth daily.) 30 tablet 2    oxyCODONE-acetaminophen (PERCOCET) 5-325 MG tablet Take 1 tablet by mouth every 6 (six) hours as needed. 12 tablet 0    predniSONE (DELTASONE) 20 MG tablet Take 60 mg daily x 2 days then 40 mg daily x 2 days then 20 mg daily x 2 days (Patient not taking: Reported on 07/26/2021) 12 tablet 0 Completed Course   valACYclovir (VALTREX) 1000 MG tablet Take 1 tablet (1,000 mg total) by mouth 3 (three) times daily. (Patient not taking: Reported on 07/26/2021) 21 tablet 0 Not Taking    Patient Stressors: Loss of husband, stepfather, and friend Traumatic event  Patient Strengths: Religious  Affiliation  Treatment Modalities: Medication Management, Group therapy, Case management,  1 to 1 session with clinician, Psychoeducation, Recreational therapy.   Physician Treatment Plan for Primary Diagnosis: Depression Long Term Goal(s): Improvement in symptoms so as ready for discharge   Short Term Goals: Ability to identify changes in lifestyle to reduce recurrence of condition will improve Ability to verbalize feelings will improve Ability to disclose and discuss suicidal ideas Ability to demonstrate self-control will improve Ability to identify and develop effective coping behaviors will improve Ability to maintain clinical measurements within normal limits will improve  Medication Management: Evaluate patient's response, side effects, and tolerance of medication  regimen.  Therapeutic Interventions: 1 to 1 sessions, Unit Group sessions and Medication administration.  Evaluation of Outcomes: Not Met  Physician Treatment Plan for Secondary Diagnosis: Principal Problem:   Depression  Long Term Goal(s): Improvement in symptoms so as ready for discharge   Short Term Goals: Ability to identify changes in lifestyle to reduce recurrence of condition will improve Ability to verbalize feelings will improve Ability to disclose and discuss suicidal ideas Ability to demonstrate self-control will improve Ability to identify and develop effective coping behaviors will improve Ability to maintain clinical measurements within normal limits will improve     Medication Management: Evaluate patient's response, side effects, and tolerance of medication regimen.  Therapeutic Interventions: 1 to 1 sessions, Unit Group sessions and Medication administration.  Evaluation of Outcomes: Not Met   RN Treatment Plan for Primary Diagnosis: Depression Long Term Goal(s): Knowledge of disease and therapeutic regimen to maintain health will improve  Short Term Goals: Ability to demonstrate self-control, Ability to participate in decision making will improve, and Ability to verbalize feelings will improve  Medication Management: RN will administer medications as ordered by provider, will assess and evaluate patient's response and provide education to patient for prescribed medication. RN will report any adverse and/or side effects to prescribing provider.  Therapeutic Interventions: 1 on 1 counseling sessions, Psychoeducation, Medication administration, Evaluate responses to treatment, Monitor vital signs and CBGs as ordered, Perform/monitor CIWA, COWS, AIMS and Fall Risk screenings as ordered, Perform wound care treatments as ordered.  Evaluation of Outcomes: Not Met   LCSW Treatment Plan for Primary Diagnosis: Depression Long Term Goal(s): Safe transition to appropriate  next level of care at discharge, Engage patient in therapeutic group addressing interpersonal concerns.  Short Term Goals: Engage patient in aftercare planning with referrals and resources, Increase social support, and Increase ability to appropriately verbalize feelings  Therapeutic Interventions: Assess for all discharge needs, 1 to 1 time with Social worker, Explore available resources and support systems, Assess for adequacy in community support network, Educate family and significant other(s) on suicide prevention, Complete Psychosocial Assessment, Interpersonal group therapy.  Evaluation of Outcomes: Not Met   Progress in Treatment: Attending groups: No. Participating in groups: No. Taking medication as prescribed: Yes. and No. Toleration medication: Yes. Family/Significant other contact made: Yes, individual(s) contacted:  brother Patient understands diagnosis: No. Discussing patient identified problems/goals with staff: Yes. Medical problems stabilized or resolved: Yes. Denies suicidal/homicidal ideation: Yes. Issues/concerns per patient self-inventory: Yes. Other: None  New problem(s) identified: No, Describe:  None  New Short Term/Long Term Goal(s):medication stabilization, elimination of SI thoughts, development of comprehensive mental wellness plan.   Patient Goals:  "to go home"  Discharge Plan or Barriers: Patient recently admitted. CSW will continue to follow and assess for appropriate referrals and possible discharge planning.   Reason for Continuation of Hospitalization: Aggression Depression Medication stabilization  Estimated Length of Stay:3-5 days  Attendees: Patient: Evelyn [name] 07/28/2021   Physician: Dr. Kai Levins 07/28/2021   Nursing:  07/28/2021   RN Care Manager: 07/28/2021   Social Worker: Toney Reil, [place] 07/28/2021   Recreational Therapist:  07/28/2021   Other:  07/28/2021   Other:  07/28/2021   Other: 07/28/2021     Scribe for Treatment  Team: Mliss Fritz, [place] 07/28/2021 2:04 PM

## 2021-07-28 NOTE — Progress Notes (Signed)
Adult Psychoeducational Group Note  Date:  07/28/2021 Time:  11:26 AM  Group Topic/Focus:  Goals Group:   The focus of this group is to help patients establish daily goals to achieve during treatment and discuss how the patient can incorporate goal setting into their daily lives to aide in recovery.  Participation Level:  Did Not Attend Evelyn [name] 07/28/2021, 11:26 AM 

## 2021-07-28 NOTE — Progress Notes (Signed)
[facility] MD Progress Note  07/28/2021 10:28 AM Evelyn [name]  MRN:  [id number] Subjective:  Evelyn [name] is a 40 y.o. female, with reported past psychiatric history of depression. Patient presents voluntarily to [facility] (07/26/2021), transferred from [facility] ED (07/24/2021) for suicide ideation and MDD. [facility] stay day 2.   Last night:  Patient is compliant with scheduled meds. PRNs: Vistaril x3 and Vicodin  Today (07/28/2021): Patient was walking around in the unit, just finished the phone conversation.  Patient still looked depressed, however she was not teary and was able to joke with this Chartered loss adjuster.  She was pleasant and cooperative with interview and physical exam.  Patient continued to voice somatic complaints of mainly diffuse musculoskeletal aches that are not tender with palpation.  Patient also described postherpetic neuralgia around T5/T6 dermatome on the right that does not cross the midline.  Currently there is no rash. When I offered to start patient on gabapentin to help with the neuralgia, patient stated that she really did not like the way it made her feel.  Patient also complained of mild constipation and that her last bowel movement was about 2 days ago.  Informed patient that there is Milk of Magnesia as needed available for patient. Patient said that her mood is much better than yesterday, and that her main complaint today is that she is still in pain.   Stated that her appetite was "fine".  Stated that the food agreed with her today. Patient slept Number of Hours: 5.5, which is improved from yesterday.  However she stated that it was " not good", patient had issues falling asleep and staying asleep.  Patient denied SI/HI, paranoia.  Stated that she feels safe here.  Patient still admits to seeing "ghosts", however today they are not of her relatives.  Patient stated they are conversational, do not bother her or impede her function, and  that has been with her since childhood.  Principal Problem: Depression Diagnosis: Principal Problem:   Depression  Total Time spent with patient: 30 minutes  Past Psychiatric History:  Patient admitted to 1 previous psychiatric hospitalization.  This was an overdose after apparently her mother had taken her child from her.  She stated the mother decided not to return the child to her custody.  She was unable to explain why.  She states she took an overdose at that time.  She stated that she was not discharged on any antidepressant medications.  She stated that she has been on multiple psychiatric medications in the past and none of been effective in most of led to side effects.   Past Medical History:  Past Medical History:  Diagnosis Date   Anemia    Anxiety    Arthritis    rheumatoid   Depression    Headache(784.0)    Hx of chlamydia infection 2012   Hx of gonorrhea 2011   Hx of trichomoniasis 2015   Lupus (HCC)    Lupus nephritis (HCC)    Osteonecrosis (HCC)    bilateral legs, worse in right hip.  D/t lupus   Pericardial effusion    Intermittent. Associated with lupus.    Past Surgical History:  Procedure Laterality Date   DILATION AND EVACUATION  11/10   ESOPHAGOGASTRODUODENOSCOPY Left 10/07/2016   Procedure: ESOPHAGOGASTRODUODENOSCOPY (EGD);  Surgeon: Dorena Cookey, MD;  Location: [facility];  Service: Gastroenterology;  Laterality: Left;   LAPAROSCOPIC TUBAL LIGATION  07/10/2011   Procedure: LAPAROSCOPIC TUBAL LIGATION;  Surgeon: Marylene Land  Annamaria Helling, MD;  Location: [facility];  Service: Gynecology;  Laterality: Bilateral;  fulguration   Family History:  Family History  Problem Relation Age of Onset   Hypertension Mother    Family Psychiatric  History:  She stated that multiple family members have multiple psychiatric illnesses.  She stated that all of her family members hear voices, but there is "nothing wrong with that".  Social History:  Social History   Substance and  Sexual Activity  Alcohol Use Yes   Alcohol/week: 3.0 standard drinks   Types: 3 Shots of liquor per week     Social History   Substance and Sexual Activity  Drug Use Yes   Frequency: 21.0 times per week   Types: Marijuana   Comment: 3 times per day    Social History   Socioeconomic History   Marital status: Single    Spouse name: Not on file   Number of children: Not on file   Years of education: Not on file   Highest education level: Not on file  Occupational History   Not on file  Tobacco Use   Smoking status: Every Day    Packs/day: 0.25    Years: 7.00    Pack years: 1.75    Types: Cigarettes   Smokeless tobacco: Never  Vaping Use   Vaping Use: Not on file  Substance and Sexual Activity   Alcohol use: Yes    Alcohol/week: 3.0 standard drinks    Types: 3 Shots of liquor per week   Drug use: Yes    Frequency: 21.0 times per week    Types: Marijuana    Comment: 3 times per day   Sexual activity: Yes    Birth control/protection: Condom  Other Topics Concern   Not on file  Social History Narrative   Not on file   Social Determinants of Health   Financial Resource Strain: Not on file  Food Insecurity: Not on file  Transportation Needs: Not on file  Physical Activity: Not on file  Stress: Not on file  Social Connections: Not on file   Additional Social History:  Currently patient lives alone.  Patient stated that she can stay with her deceased husband's family that are supportive, but stated that she feels "not right". Besides her deceased husband's family, patient stated she has no other relations or social support in [place].  Stated that she is actually from [place], and that she originally moved here to live with her deceased husband. Patient is currently on disability and unemployed. Patient stated that she has 1 child, but does not have custody of her.  Sleep: Fair, improved since yesterday  Appetite: Good  Current Medications: Current  Facility-Administered Medications  Medication Dose Route Frequency Provider Last Rate Last Admin   acetaminophen (TYLENOL) tablet 650 mg  650 mg Oral Q6H PRN Antonieta Pert, MD       alum & mag hydroxide-simeth (MAALOX/MYLANTA) 200-200-20 MG/5ML suspension 30 mL  30 mL Oral Q4H PRN Antonieta Pert, MD       DULoxetine (CYMBALTA) DR capsule 30 mg  30 mg Oral Daily Antonieta Pert, MD   30 mg at 07/28/21 0900   feeding supplement (ENSURE ENLIVE / ENSURE PLUS) liquid 237 mL  237 mL Oral BID BM Antonieta Pert, MD   237 mL at 07/27/21 1501   HYDROcodone-acetaminophen (NORCO/VICODIN) 5-325 MG per tablet 1-2 tablet  1-2 tablet Oral Q6H PRN Antonieta Pert, MD   1 tablet at  07/27/21 0036   hydroxychloroquine (PLAQUENIL) tablet 200 mg  200 mg Oral BID Antonieta Pert, MD   200 mg at 07/28/21 0900   hydrOXYzine (ATARAX/VISTARIL) tablet 25 mg  25 mg Oral TID PRN Antonieta Pert, MD   25 mg at 07/27/21 2122   lidocaine (XYLOCAINE) 2 % jelly 1 application  1 application Topical BID PRN Antonieta Pert, MD       magnesium hydroxide (MILK OF MAGNESIA) suspension 30 mL  30 mL Oral Daily PRN Antonieta Pert, MD       ondansetron (ZOFRAN-ODT) disintegrating tablet 8 mg  8 mg Oral Q8H PRN Antonieta Pert, MD       pantoprazole (PROTONIX) EC tablet 40 mg  40 mg Oral Daily Antonieta Pert, MD   40 mg at 07/28/21 0900   [START ON 07/31/2021] predniSONE (DELTASONE) tablet 10 mg  10 mg Oral Q breakfast Antonieta Pert, MD       Melene Muller ON 07/29/2021] predniSONE (DELTASONE) tablet 15 mg  15 mg Oral Q breakfast Antonieta Pert, MD       traZODone (DESYREL) tablet 50 mg  50 mg Oral QHS PRN Antonieta Pert, MD   50 mg at 07/27/21 2122   valACYclovir (VALTREX) tablet 1,000 mg  1,000 mg Oral TID Antonieta Pert, MD   1,000 mg at 07/28/21 0900   ziprasidone (GEODON) injection 10 mg  10 mg Intramuscular Q8H PRN Armandina Stammer I, NP        Lab Results:  Results for orders placed or  performed during the hospital encounter of 07/26/21 (from the past 48 hour(s))  Rapid urine drug screen (hospital performed)     Status: Abnormal   Collection Time: 07/27/21 12:20 PM  Result Value Ref Range   Opiates POSITIVE (A) NONE DETECTED   Cocaine NONE DETECTED NONE DETECTED   Benzodiazepines NONE DETECTED NONE DETECTED   Amphetamines NONE DETECTED NONE DETECTED   Tetrahydrocannabinol POSITIVE (A) NONE DETECTED   Barbiturates NONE DETECTED NONE DETECTED    Comment: (NOTE) DRUG SCREEN FOR MEDICAL PURPOSES ONLY.  IF CONFIRMATION IS NEEDED FOR ANY PURPOSE, NOTIFY LAB WITHIN 5 DAYS.  LOWEST DETECTABLE LIMITS FOR URINE DRUG SCREEN Drug Class                     Cutoff (ng/mL) Amphetamine and metabolites    1000 Barbiturate and metabolites    200 Benzodiazepine                 200 Tricyclics and metabolites     300 Opiates and metabolites        300 Cocaine and metabolites        300 THC                            50 Performed at [facility], [address] [place], [place] [address]   Pregnancy, urine     Status: None   Collection Time: 07/27/21 12:20 PM  Result Value Ref Range   Preg Test, Ur NEGATIVE NEGATIVE    Comment:        THE SENSITIVITY OF THIS METHODOLOGY IS >20 mIU/mL. Performed at [facility], [address], [place], [place] [address]   Acetaminophen level     Status: Abnormal   Collection Time: 07/27/21  6:30 PM  Result Value Ref Range   Acetaminophen (Tylenol), Serum <10 (L)  10 - 30 ug/mL    Comment: (NOTE) Therapeutic concentrations vary significantly. A range of 10-30 ug/mL  may be an effective concentration for many patients. However, some  are best treated at concentrations outside of this range. Acetaminophen concentrations >150 ug/mL at 4 hours after ingestion  and >50 ug/mL at 12 hours after ingestion are often associated with  toxic reactions.  Performed at [facility], [address], [place], [place] [address]   hCG, quantitative, pregnancy     Status: None   Collection Time: 07/27/21  6:30 PM  Result Value Ref Range   hCG, Beta Chain, Quant, S 2 <5 mIU/mL    Comment:          GEST. AGE      CONC.  (mIU/mL)   <=1 WEEK        5 - 50     2 WEEKS       50 - 500     3 WEEKS       100 - 10,000     4 WEEKS     1,000 - 30,000     5 WEEKS     3,500 - 115,000   6-8 WEEKS     12,000 - 270,000    12 WEEKS     15,000 - 220,000        FEMALE AND NON-PREGNANT FEMALE:     LESS THAN 5 mIU/mL Performed at [facility], [address], [place], [place] [address]   Lipid panel     Status: None   Collection Time: 07/27/21  6:30 PM  Result Value Ref Range   Cholesterol 164 0 - 200 mg/dL   Triglycerides 55 <427 mg/dL   HDL 89 >06 mg/dL   Total CHOL/HDL Ratio 1.8 RATIO   VLDL 11 0 - 40 mg/dL   LDL Cholesterol 64 0 - 99 mg/dL    Comment:        Total Cholesterol/HDL:CHD Risk Coronary Heart Disease Risk Table                     Men   Women  1/2 Average Risk   3.4   3.3  Average Risk       5.0   4.4  2 X Average Risk   9.6   7.1  3 X Average Risk  23.4   11.0        Use the calculated Patient Ratio above and the CHD Risk Table to determine the patient's CHD Risk.        ATP III CLASSIFICATION (LDL):  <100     mg/dL   Optimal  237-628  mg/dL   Near or Above                    Optimal  130-159  mg/dL   Borderline  315-176  mg/dL   High  >160     mg/dL   Very High Performed at [facility], [address], [place], [place] [address]   TSH     Status: Abnormal   Collection Time: 07/27/21  6:30 PM  Result Value Ref Range   TSH 0.235 (L) 0.350 - 4.500 uIU/mL    Comment: Performed by a 3rd Generation assay with a functional sensitivity of <=0.01 uIU/mL. Performed at [facility], [address], [place], [place] [address]     Blood Alcohol level:  Lab Results  Component  Value Date   [facility] <10 07/24/2021    ETH <11 09/16/2013    Metabolic Disorder Labs: Lab Results  Component Value Date   HGBA1C 5.3 01/13/2020   MPG (H) 04/14/2010    137 ** Please note change in reference range(s). **   MPG 120 03/30/2010   No results found for: PROLACTIN Lab Results  Component Value Date   CHOL 164 07/27/2021   TRIG 55 07/27/2021   HDL 89 07/27/2021   CHOLHDL 1.8 07/27/2021   VLDL 11 07/27/2021   LDLCALC 64 07/27/2021     Musculoskeletal: Strength & Muscle Tone: within normal limits Gait & Station:  Was not able to assess today due to patient lying in bed. Patient leans: N/A  Psychiatric Specialty Exam:  Presentation  General Appearance: Disheveled  Eye Contact:Good  Speech:Normal Rate; Clear and Coherent  Speech Volume:Normal  Handedness:Right   Mood and Affect  Mood:Depressed (Lonely, but better today)  Affect:Congruent; Depressed (Not tearful today)   Thought Process  Thought Processes:Coherent; Goal Directed  Descriptions of Associations:Intact  Orientation:Full (Time, Place and Person)  Thought Content:Logical  History of Schizophrenia/Schizoaffective disorder:No  Duration of Psychotic Symptoms:No data recorded Hallucinations:Hallucinations: Visual Description of Auditory Hallucinations: Patient stated that the "ghosts" are conversational to her, and do not bother her. Description of Visual Hallucinations: Patient still reports seeing ghosts of random people. Stated that it doesn't bother her or impede her function  Ideas of Reference:None  Suicidal Thoughts:Suicidal Thoughts: No  Homicidal Thoughts:Homicidal Thoughts: No   Sensorium  Memory:Recent Good; Remote Fair  Judgment:Fair  Insight:Fair   Executive Functions  Concentration:Good  Attention Span:Good  Recall:Good  Fund of Knowledge:Good  Language:Good   Psychomotor Activity  Psychomotor Activity:Psychomotor Activity: Normal   Assets  Assets:Communication Skills; Desire for  Improvement   Sleep  Sleep:Sleep: Fair Number of Hours of Sleep: 5.5    Physical Exam: Physical Exam Vitals and nursing note reviewed.  Constitutional:      Appearance: She is diaphoretic.  HENT:     Head: Normocephalic and atraumatic.  Pulmonary:     Effort: Pulmonary effort is normal.  Musculoskeletal:        General: No swelling, tenderness or deformity. Normal range of motion.     Right lower leg: No edema.     Left lower leg: No edema.     Comments: Patient complains of overall musculoskeletal aches, that are not tender to palpation.  No swelling or edema or deformities on physical exam  Skin:    General: Skin is warm.  Neurological:     Mental Status: She is alert and oriented to person, place, and time.     Motor: No weakness.   Review of Systems  HENT:  Negative for sore throat.   Eyes:  Negative for blurred vision.  Respiratory:  Negative for cough and shortness of breath.   Cardiovascular:  Negative for chest pain, palpitations and leg swelling.  Gastrointestinal:  Negative for abdominal pain, constipation, diarrhea, nausea and vomiting (Patient stated that the food she recently ate did not agree with her.  Stated that she ended throwing up 3 times though patient did not explain how much.).  Genitourinary:  Negative for dysuria.  Musculoskeletal:  Positive for myalgias (Patient complains of diffuse, chronic musculoskeletal pain that is constant and not tender to palpation.).  Neurological:  Positive for headaches (Patient reported diffuse intermittent headaches.). Negative for dizziness and weakness.  Blood pressure (!) 91/58, pulse 84, temperature 98.1 F (36.7 C), temperature source Oral,  resp. rate 20, height  (1.651 m), SpO2 100 %. Body mass index is 19.47 kg/m.   Treatment Plan Summary: Daily contact with patient to assess and evaluate symptoms and progress in treatment and Medication management  PSYCHIATRIC ISSUES: MDD -Continue Cymbalta DR 30 mg  p.o. daily  We will consider increasing in the future.    MEDICAL ISSUES: SLE -Continue Plaquenil 200 mg p.o. twice daily -Continue prednisone taper  Shingles and herpes -Continue Valtrex 100 mg p.o. 3 times daily -Discontinue gabapentin 300 mg p.o. 3 times daily because patient stated that it made her feel weird  Hyperthyroidism Patient admitted to diaphoresis, but denied heart palpitations, chest pain, diarrhea , nausea, vomiting TSH 0.235 (7/28)  Free T3, free T4 pending  PRN's -Trazadone  PO qHS PRN for insomnia -Hydroxyzine  PO TID PRN for anxiety -Alum & mag hydroxide-simeth 30ml PO qHS PRN for GERD -Magnesium hydroxide 30ml PO daily PRN for constipation -Acetaminophen tablet  PO PRN q6hrs for mild pain   Princess Bruins, DO, PGY-1 07/28/2021, 10:28 AM

## 2021-07-28 NOTE — [facility] Suicide Risk Assessment (Signed)
[facility] INPATIENT:  Family/Significant Other Suicide Prevention Education  Suicide Prevention Education:  Education Completed; [name], Friend,  (name of family member/significant other) has been identified by the patient as the family member/significant other with whom the patient will be residing, and identified as the person(s) who will aid the patient in the event of a mental health crisis (suicidal ideations/suicide attempt).  With written consent from the patient, the family member/significant other has been provided the following suicide prevention education, prior to the and/or following the discharge of the patient.  [name] informed CSW that patient stays with her while in [place]. CSW informed friend of benefit of patient switching medicaid from [place] to [place].  [name] reports no firearms in the house and patient does not have access to firearms.  [name] reports that she will come back to stay with her and that she will be able to pick patient up upon discharge.   The suicide prevention education provided includes the following: Suicide risk factors Suicide prevention and interventions National Suicide Hotline telephone number [facility] assessment telephone number [facility] Emergency Assistance 911 [facility] and/or Residential Mobile Crisis Unit telephone number  Request made of family/significant other to: Remove weapons (e.g., guns, rifles, knives), all items previously/currently identified as safety concern.   Remove drugs/medications (over-the-counter, prescriptions, illicit drugs), all items previously/currently identified as a safety concern.  The family member/significant other verbalizes understanding of the suicide prevention education information provided.  The family member/significant other agrees to remove the items of safety concern listed above.  Tige Meas E Lakeena Downie 07/28/2021, 4:16 PM

## 2021-07-28 NOTE — [facility] Suicide Risk Assessment (Signed)
CSW provided education to patient about switching medicaid to [facility].  CSW also provided patient with grief support resources.  Patient appreciative of resources.    Ozzy Bohlken, LCSW, LCAS Clincal Social Worker  [facility]

## 2021-07-28 NOTE — [facility] Group Notes (Signed)
LCSW Group Therapy Note  Type of Therapy/Topic: Group Therapy: Six Dimensions of Wellness  Participation Level: Did Not Attend  Description of Group: This group will address the concept of wellness and the six concepts of wellness: occupational, physical, social, intellectual, spiritual, and emotional. Patients will be encouraged to process areas in their lives that are out of balance and identify reasons for remaining unbalanced. Patients will be encouraged to explore ways to practice healthy habits daily to attain better physical and mental health outcomes.  Therapeutic Goals: 1. Identify aspects of wellness that they are doing well. 2. Identify aspects of wellness that they would like to improve upon. 3. Identify one action they can take to improve an aspect of wellness in their lives.     Therapeutic Modalities: Cognitive Behavioral Therapy Solution-Focused Therapy Relapse Prevention   Odell Choung, LCSWA Clinicial Social Worker [facility]  

## 2021-07-28 NOTE — Progress Notes (Signed)
Recreation Therapy Notes  Date:  7.29.22 Time: 0930 Location: [address] Dayroom  Group Topic: Stress Management  Goal Area(s) Addresses:  Patient will identify positive stress management techniques. Patient will identify benefits of using stress management post d/c.  Intervention: Stress Management  Activity :  LRT played a meditation that focused on dealing with procrastination.  Patients were to listen to the meditation as it played.  Education:  Stress Management, Discharge Planning.   Education Outcome: Acknowledges Education  Clinical Observations/Feedback:  Pt did not attend group session.    Caroll Rancher, LRT/CTRS         Caroll Rancher A 07/28/2021 12:35 PM

## 2021-07-28 NOTE — Progress Notes (Signed)
   07/28/21 2105  Psych Admission Type (Psych Patients Only)  Admission Status Voluntary  Psychosocial Assessment  Patient Complaints Crying spells;Depression;Sadness  Eye Contact Brief  Facial Expression Trembling lip;Sad;Sullen  Affect Depressed;Sad  Land;Attention-seeking  Motor Activity Slow  Appearance/Hygiene Disheveled  Behavior Characteristics Anxious  Mood Depressed;Anxious  Thought Process  Coherency WDL  Content Blaming others  Delusions None reported or observed  Perception WDL  Hallucination None reported or observed  Judgment Impaired  Confusion None  Danger to Self  Current suicidal ideation? Passive  Self-Injurious Behavior No self-injurious ideation or behavior indicators observed or expressed   Agreement Not to Harm Self Yes  Description of Agreement Verbal contract  Danger to Others  Danger to Others None reported or observed

## 2021-07-29 LAB — T3, FREE: T3, Free: 1.5 pg/mL — ABNORMAL LOW (ref 2.0–4.4)

## 2021-07-29 LAB — HEMOGLOBIN A1C
Hgb A1c MFr Bld: 5.6 % (ref 4.8–5.6)
Mean Plasma Glucose: 114 mg/dL

## 2021-07-29 MED ORDER — PREDNISONE 10 MG PO TABS
10.0000 mg | ORAL_TABLET | Freq: Every day | ORAL | Status: DC
  Filled 2021-07-29 (×2): qty 1

## 2021-07-29 MED ORDER — PREDNISONE 10 MG PO TABS
10.0000 mg | ORAL_TABLET | Freq: Every day | ORAL | Status: DC
  Administered 2021-07-30: 10 mg via ORAL
  Filled 2021-07-29 (×2): qty 1

## 2021-07-29 MED ORDER — DULOXETINE HCL 20 MG PO CPEP
40.0000 mg | ORAL_CAPSULE | Freq: Every day | ORAL | Status: DC
  Administered 2021-07-30: 40 mg via ORAL
  Filled 2021-07-29 (×2): qty 2

## 2021-07-29 NOTE — [facility] Group Notes (Signed)
LCSW Group Therapy Note  07/29/2021   10:00-11:00am   Type of Therapy and Topic:  Group Therapy: Anger Cues and Responses  Participation Level:  Did Not Attend   Description of Group:   In this group, patients learned how to recognize the physical, cognitive, emotional, and behavioral responses they have to anger-provoking situations.  They identified a recent time they became angry and how they reacted.  They analyzed how their reaction was possibly beneficial and how it was possibly unhelpful.  The group discussed a variety of healthier coping skills that could help with such a situation in the future.  Focus was placed on how helpful it is to recognize the underlying emotions to our anger, because working on those can lead to a more permanent solution as well as our ability to focus on the important rather than the urgent.  Therapeutic Goals: Patients will remember their last incident of anger and how they felt emotionally and physically, what their thoughts were at the time, and how they behaved. Patients will identify how their behavior at that time worked for them, as well as how it worked against them. Patients will explore possible new behaviors to use in future anger situations. Patients will learn that anger itself is normal and cannot be eliminated, and that healthier reactions can assist with resolving conflict rather than worsening situations.  Summary of Patient Progress:  The patient was invited to group, did not attend.  Therapeutic Modalities:   Cognitive Behavioral Therapy  Nilay Mangrum J Grossman-Orr   

## 2021-07-29 NOTE — Progress Notes (Signed)
[facility] MD Progress Note  07/29/2021 11:29 AM Evelyn [name]  MRN:  [id number] Subjective:  Evelyn [name] is a 40 y.o. female, with reported past psychiatric history of depression. Patient presents voluntarily to [facility] (07/26/2021), transferred from [facility] ED (07/24/2021) for suicide ideation and MDD. [facility] stay day 3.   Last night:  Patient is compliant with scheduled meds. PRNs: Vistaril and Vicodin  Today (07/29/2021): Patient was seen walking around the unit.  Patient still looks depressed but improved, was pleasant and cooperative on interview. Patient asked when can she go home.  Patient stated that her mood has improved significantly, and that her pain is not really bothering her today.  Patient did complain of mild sore throat, denied dysphagia and odynophagia.  Patient is currently afebrile.  Patient stated that does not bother her much, but she does want to let this author know.  Discussed with patient about her lower blood pressure.  Patient stated that her blood pressure has normally run low for years, stated that other doctors have noticed and asked her the same questions.  Patient denied dizziness, shortness of breath, syncope, palpitations, nausea, chest pain. Patient stated that her mood is "not good or bad", stated that here she is bored and has too much time to think about her deceased husband.  Stated that this makes her sad, wishes that she had more to do.  Patient stated that her sleep here is not good, due to interruptions from nightly checkups.  Stated she has no issues sleeping at home. Patient slept Number of Hours: 6, which is improved from yesterday.   Patient stated that her appetite is "all right", that she does not like the food here and dreams about the food at home. Patient denied SI/HI, paranoia.  Stated that she feels safe here. Patient still admits to seeing "ghosts", however today they are not of her relatives. Patient  stated they are conversational, do not bother her or impede her function, and that has been with her since childhood.  Principal Problem: Depression Diagnosis: Principal Problem:   Depression  Total Time spent with patient: 30 minutes  Past Psychiatric History:  Patient admitted to 1 previous psychiatric hospitalization.  This was an overdose after apparently her mother had taken her child from her.  She stated the mother decided not to return the child to her custody.  She was unable to explain why.  She states she took an overdose at that time.  She stated that she was not discharged on any antidepressant medications.  She stated that she has been on multiple psychiatric medications in the past and none of been effective in most of led to side effects.   Past Medical History:  Past Medical History:  Diagnosis Date   Anemia    Anxiety    Arthritis    rheumatoid   Depression    Headache(784.0)    Hx of chlamydia infection 2012   Hx of gonorrhea 2011   Hx of trichomoniasis 2015   Lupus (HCC)    Lupus nephritis (HCC)    Osteonecrosis (HCC)    bilateral legs, worse in right hip.  D/t lupus   Pericardial effusion    Intermittent. Associated with lupus.    Past Surgical History:  Procedure Laterality Date   DILATION AND EVACUATION  11/10   ESOPHAGOGASTRODUODENOSCOPY Left 10/07/2016   Procedure: ESOPHAGOGASTRODUODENOSCOPY (EGD);  Surgeon: Dorena Cookey, MD;  Location: [facility];  Service: Gastroenterology;  Laterality: Left;  LAPAROSCOPIC TUBAL LIGATION  07/10/2011   Procedure: LAPAROSCOPIC TUBAL LIGATION;  Surgeon: Purcell Nails, MD;  Location: [facility];  Service: Gynecology;  Laterality: Bilateral;  fulguration   Family History:  Family History  Problem Relation Age of Onset   Hypertension Mother    Family Psychiatric  History:  She stated that multiple family members have multiple psychiatric illnesses.  She stated that all of her family members hear voices, but there is  "nothing wrong with that".  Social History:  Social History   Substance and Sexual Activity  Alcohol Use Yes   Alcohol/week: 3.0 standard drinks   Types: 3 Shots of liquor per week     Social History   Substance and Sexual Activity  Drug Use Yes   Frequency: 21.0 times per week   Types: Marijuana   Comment: 3 times per day    Social History   Socioeconomic History   Marital status: Single    Spouse name: Not on file   Number of children: Not on file   Years of education: Not on file   Highest education level: Not on file  Occupational History   Not on file  Tobacco Use   Smoking status: Every Day    Packs/day: 0.25    Years: 7.00    Pack years: 1.75    Types: Cigarettes   Smokeless tobacco: Never  Vaping Use   Vaping Use: Not on file  Substance and Sexual Activity   Alcohol use: Yes    Alcohol/week: 3.0 standard drinks    Types: 3 Shots of liquor per week   Drug use: Yes    Frequency: 21.0 times per week    Types: Marijuana    Comment: 3 times per day   Sexual activity: Yes    Birth control/protection: Condom  Other Topics Concern   Not on file  Social History Narrative   Not on file   Social Determinants of Health   Financial Resource Strain: Not on file  Food Insecurity: Not on file  Transportation Needs: Not on file  Physical Activity: Not on file  Stress: Not on file  Social Connections: Not on file   Additional Social History:  Currently patient lives alone.  Patient stated that she can stay with her deceased husband's family that are supportive, but stated that she feels "not right". Besides her deceased husband's family, patient stated she has no other relations or social support in [place].  Stated that she is actually from [place], and that she originally moved here to live with her deceased husband. Patient is currently on disability and unemployed. Patient stated that she has 1 child, but does not have custody of her.  Sleep: Good,  improved since yesterday  Appetite: Good, but does not like the food here  Current Medications: Current Facility-Administered Medications  Medication Dose Route Frequency Provider Last Rate Last Admin   acetaminophen (TYLENOL) tablet 650 mg  650 mg Oral Q6H PRN Antonieta Pert, MD       alum & mag hydroxide-simeth (MAALOX/MYLANTA) 200-200-20 MG/5ML suspension 30 mL  30 mL Oral Q4H PRN Antonieta Pert, MD       DULoxetine (CYMBALTA) DR capsule 30 mg  30 mg Oral Daily Antonieta Pert, MD   30 mg at 07/29/21 0830   feeding supplement (ENSURE ENLIVE / ENSURE PLUS) liquid 237 mL  237 mL Oral BID BM Antonieta Pert, MD   237 mL at 07/29/21 8416   HYDROcodone-acetaminophen (NORCO/VICODIN)  5-325 MG per tablet 1-2 tablet  1-2 tablet Oral Q6H PRN Antonieta Pert, MD   1 tablet at 07/28/21 1726   hydroxychloroquine (PLAQUENIL) tablet 200 mg  200 mg Oral BID Antonieta Pert, MD   200 mg at 07/29/21 0831   hydrOXYzine (ATARAX/VISTARIL) tablet 25 mg  25 mg Oral TID PRN Antonieta Pert, MD   25 mg at 07/28/21 2105   lidocaine (XYLOCAINE) 2 % jelly 1 application  1 application Topical BID PRN Antonieta Pert, MD       magnesium hydroxide (MILK OF MAGNESIA) suspension 30 mL  30 mL Oral Daily PRN Antonieta Pert, MD       ondansetron (ZOFRAN-ODT) disintegrating tablet 8 mg  8 mg Oral Q8H PRN Antonieta Pert, MD       pantoprazole (PROTONIX) EC tablet 40 mg  40 mg Oral Daily Antonieta Pert, MD   40 mg at 07/29/21 0830   [START ON 07/31/2021] predniSONE (DELTASONE) tablet 10 mg  10 mg Oral Q breakfast Antonieta Pert, MD       traZODone (DESYREL) tablet 50 mg  50 mg Oral QHS PRN Antonieta Pert, MD   50 mg at 07/27/21 2122   valACYclovir (VALTREX) tablet 1,000 mg  1,000 mg Oral TID Antonieta Pert, MD   1,000 mg at 07/29/21 0830   ziprasidone (GEODON) injection 10 mg  10 mg Intramuscular Q8H PRN Armandina Stammer I, NP        Lab Results:  Results for orders placed or  performed during the hospital encounter of 07/26/21 (from the past 48 hour(s))  Rapid urine drug screen (hospital performed)     Status: Abnormal   Collection Time: 07/27/21 12:20 PM  Result Value Ref Range   Opiates POSITIVE (A) NONE DETECTED   Cocaine NONE DETECTED NONE DETECTED   Benzodiazepines NONE DETECTED NONE DETECTED   Amphetamines NONE DETECTED NONE DETECTED   Tetrahydrocannabinol POSITIVE (A) NONE DETECTED   Barbiturates NONE DETECTED NONE DETECTED    Comment: (NOTE) DRUG SCREEN FOR MEDICAL PURPOSES ONLY.  IF CONFIRMATION IS NEEDED FOR ANY PURPOSE, NOTIFY LAB WITHIN 5 DAYS.  LOWEST DETECTABLE LIMITS FOR URINE DRUG SCREEN Drug Class                     Cutoff (ng/mL) Amphetamine and metabolites    1000 Barbiturate and metabolites    200 Benzodiazepine                 200 Tricyclics and metabolites     300 Opiates and metabolites        300 Cocaine and metabolites        300 THC                            50 Performed at [facility], [address], [place], [place] [address]   Pregnancy, urine     Status: None   Collection Time: 07/27/21 12:20 PM  Result Value Ref Range   Preg Test, Ur NEGATIVE NEGATIVE    Comment:        THE SENSITIVITY OF THIS METHODOLOGY IS >20 mIU/mL. Performed at [facility], [address] [place], [place] [address]   Acetaminophen level     Status: Abnormal   Collection Time: 07/27/21  6:30 PM  Result Value Ref Range   Acetaminophen (Tylenol), Serum <10 (L) 10 - 30  ug/mL    Comment: (NOTE) Therapeutic concentrations vary significantly. A range of 10-30 ug/mL  may be an effective concentration for many patients. However, some  are best treated at concentrations outside of this range. Acetaminophen concentrations >150 ug/mL at 4 hours after ingestion  and >50 ug/mL at 12 hours after ingestion are often associated with  toxic reactions.  Performed at [facility], [address], [place], [place] [address]   hCG, quantitative, pregnancy     Status: None   Collection Time: 07/27/21  6:30 PM  Result Value Ref Range   hCG, Beta Chain, Quant, S 2 <5 mIU/mL    Comment:          GEST. AGE      CONC.  (mIU/mL)   <=1 WEEK        5 - 50     2 WEEKS       50 - 500     3 WEEKS       100 - 10,000     4 WEEKS     1,000 - 30,000     5 WEEKS     3,500 - 115,000   6-8 WEEKS     12,000 - 270,000    12 WEEKS     15,000 - 220,000        FEMALE AND NON-PREGNANT FEMALE:     LESS THAN 5 mIU/mL Performed at [facility], [address], [place], [place] [address]   Lipid panel     Status: None   Collection Time: 07/27/21  6:30 PM  Result Value Ref Range   Cholesterol 164 0 - 200 mg/dL   Triglycerides 55 <562 mg/dL   HDL 89 >13 mg/dL   Total CHOL/HDL Ratio 1.8 RATIO   VLDL 11 0 - 40 mg/dL   LDL Cholesterol 64 0 - 99 mg/dL    Comment:        Total Cholesterol/HDL:CHD Risk Coronary Heart Disease Risk Table                     Men   Women  1/2 Average Risk   3.4   3.3  Average Risk       5.0   4.4  2 X Average Risk   9.6   7.1  3 X Average Risk  23.4   11.0        Use the calculated Patient Ratio above and the CHD Risk Table to determine the patient's CHD Risk.        ATP III CLASSIFICATION (LDL):  <100     mg/dL   Optimal  086-578  mg/dL   Near or Above                    Optimal  130-159  mg/dL   Borderline  469-629  mg/dL   High  >528     mg/dL   Very High Performed at [facility], [address], [place], [place] [address]   Hemoglobin A1c     Status: None   Collection Time: 07/27/21  6:30 PM  Result Value Ref Range   Hgb A1c MFr Bld 5.6 4.8 - 5.6 %    Comment: (NOTE)         Prediabetes: 5.7 - 6.4         Diabetes: >6.4         Glycemic control for adults with diabetes: <7.0  Mean Plasma Glucose 114 mg/dL    Comment: (NOTE) Performed At: [facility] [address] [place], [place]  [id number] Jolene Schimke MD WU:[id number]   TSH     Status: Abnormal   Collection Time: 07/27/21  6:30 PM  Result Value Ref Range   TSH 0.235 (L) 0.350 - 4.500 uIU/mL    Comment: Performed by a 3rd Generation assay with a functional sensitivity of <=0.01 uIU/mL. Performed at [facility], [address] [place], [place] [address]   T4, free     Status: None   Collection Time: 07/28/21  8:37 AM  Result Value Ref Range   Free T4 0.93 0.61 - 1.12 ng/dL    Comment: (NOTE) Biotin ingestion may interfere with free T4 tests. If the results are inconsistent with the TSH level, previous test results, or the clinical presentation, then consider biotin interference. If needed, order repeat testing after stopping biotin. Performed at [facility], [address], [place], [place] [address]   T3, free     Status: Abnormal   Collection Time: 07/28/21  8:37 AM  Result Value Ref Range   T3, Free 1.5 (L) 2.0 - 4.4 pg/mL    Comment: (NOTE) Performed At: [facility] [address] [place], [place] [id number] Jolene Schimke MD MW:[id number]     Blood Alcohol level:  Lab Results  Component Value Date   [facility] <10 07/24/2021   ETH <11 09/16/2013    Metabolic Disorder Labs: Lab Results  Component Value Date   HGBA1C 5.3 01/13/2020   MPG (H) 04/14/2010    137 ** Please note change in reference range(s). **   MPG 120 03/30/2010   No results found for: PROLACTIN Lab Results  Component Value Date   CHOL 164 07/27/2021   TRIG 55 07/27/2021   HDL 89 07/27/2021   CHOLHDL 1.8 07/27/2021   VLDL 11 07/27/2021   LDLCALC 64 07/27/2021     Musculoskeletal: Strength & Muscle Tone: within normal limits Gait & Station: Within normal limits Patient leans: N/A  Psychiatric Specialty Exam:  Presentation  General Appearance: Disheveled  Eye Contact:Fair  Speech:Clear and Coherent; Normal Rate  Speech Volume:Normal  Handedness:Right   Mood and  Affect  Mood:Depressed  Affect:Congruent   Thought Process  Thought Processes:Coherent; Goal Directed; Linear  Descriptions of Associations:Intact  Orientation:Full (Time, Place and Person)  Thought Content:Logical; WDL  History of Schizophrenia/Schizoaffective disorder:No  Duration of Psychotic Symptoms:No data recorded Hallucinations:Hallucinations: Auditory; Visual Description of Auditory Hallucinations: Patient stated that the "ghost" for conversational, not commanding and do not bother her or impair function. Description of Visual Hallucinations: Patient still sees the "ghosts" of random people, that are conversational, do not bother her or impede her function.  Ideas of Reference:None  Suicidal Thoughts:Suicidal Thoughts: No  Homicidal Thoughts:Homicidal Thoughts: No   Sensorium  Memory:Immediate Good; Recent Good; Remote Fair  Judgment:Fair  Insight:Fair   Executive Functions  Concentration:Good  Attention Span:Good  Recall:Good  Fund of Knowledge:Good  Language:Good   Psychomotor Activity  Psychomotor Activity:Psychomotor Activity: Normal   Assets  Assets:Communication Skills; Desire for Improvement; Housing   Sleep  Sleep:Sleep: Fair Number of Hours of Sleep: 6    Physical Exam: Physical Exam Vitals and nursing note reviewed.  Constitutional:      Appearance: She is not diaphoretic.  HENT:     Head: Normocephalic and atraumatic.  Pulmonary:     Effort: Pulmonary effort is normal.  Musculoskeletal:  General: No swelling, tenderness or deformity. Normal range of motion.     Right lower leg: No edema.     Left lower leg: No edema.     Comments: Patient complains of overall musculoskeletal aches, that are not tender to palpation.  No swelling or edema or deformities on physical exam  Skin:    General: Skin is warm.  Neurological:     Mental Status: She is alert and oriented to person, place, and time.     Motor: No weakness.    Review of Systems  Constitutional:  Negative for diaphoresis.  HENT:  Positive for sore throat (Patient reported mild sore throat, she is afebrile, denied dysphagia and odynophagia). Negative for congestion.   Eyes:  Negative for blurred vision.  Respiratory:  Negative for cough and shortness of breath.   Cardiovascular:  Negative for chest pain, palpitations and leg swelling.  Gastrointestinal:  Negative for abdominal pain, constipation, diarrhea, nausea and vomiting (Patient stated that the food she recently ate did not agree with her.  Stated that she ended throwing up 3 times though patient did not explain how much.).  Genitourinary:  Negative for dysuria.  Musculoskeletal:  Positive for myalgias (Patient complains of diffuse, chronic musculoskeletal pain that is constant and not tender to palpation.).  Neurological:  Negative for dizziness, weakness and headaches (Patient reported diffuse intermittent headaches.).  Blood pressure (!) 98/59, pulse 70, temperature 98.1 F (36.7 C), temperature source Oral, resp. rate 20, height 5\' 5"  (1.651 m), SpO2 100 %. Body mass index is 19.47 kg/m.   Treatment Plan Summary: Daily contact with patient to assess and evaluate symptoms and progress in treatment and Medication management  PSYCHIATRIC ISSUES: MDD -Increase Cymbalta DR 30 mg p.o. daily to Cymbalta DR 40 mg p.o. daily   MEDICAL ISSUES: SLE -Continue Plaquenil 200 mg p.o. twice daily -Continue prednisone taper  Shingles and herpes -Continue Valtrex 100 mg p.o. 3 times daily   Euthyroid Sick Syndrome Patient denied diaphoresis, heart palpitations, chest pain, diarrhea, nausea, vomiting.  Discussed with patient to follow this up with outpatient PCP in about a week. TSH 0.235 (7/28)  Free T3 1.5 (7/30) Free T4 0.93 (7/30)  PRN's -Trazadone 50mg  PO qHS PRN for insomnia -Hydroxyzine 25mg  PO TID PRN for anxiety -Alum & mag hydroxide-simeth 61ml PO qHS PRN for GERD -Magnesium  hydroxide 28ml PO daily PRN for constipation -Acetaminophen tablet 650mg  PO PRN q6hrs for mild pain   , DO, PGY-1 07/29/2021, 11:29 AM

## 2021-07-29 NOTE — Progress Notes (Signed)
Patient rated her day as a 5 out of 10 since she had a stomach ache. Her goal for tomorrow is to go home.

## 2021-07-29 NOTE — Progress Notes (Signed)
   07/29/21 1300  Psych Admission Type (Psych Patients Only)  Admission Status Voluntary  Psychosocial Assessment  Patient Complaints Depression  Eye Contact Brief  Facial Expression Trembling lip;Sad;Sullen  Affect Depressed;Sad  Land;Attention-seeking  Motor Activity Slow  Appearance/Hygiene Disheveled  Behavior Characteristics Cooperative;Anxious  Mood Anxious  Thought Process  Coherency WDL  Content Blaming others  Delusions None reported or observed  Perception WDL  Hallucination None reported or observed  Judgment Impaired  Confusion None  Danger to Self  Current suicidal ideation? Denies  Self-Injurious Behavior No self-injurious ideation or behavior indicators observed or expressed   Agreement Not to Harm Self Yes  Description of Agreement Verbal contract  Danger to Others  Danger to Others None reported or observed

## 2021-07-29 NOTE — Progress Notes (Signed)
   07/29/21 2341  Psych Admission Type (Psych Patients Only)  Admission Status Voluntary  Psychosocial Assessment  Patient Complaints Depression  Eye Contact Brief  Facial Expression Flat  Affect Appropriate to circumstance  Speech Logical/coherent  Interaction Assertive  Motor Activity Other (Comment) (WDL)  Appearance/Hygiene Unremarkable  Behavior Characteristics Appropriate to situation  Mood Anxious;Depressed  Thought Process  Coherency WDL  Content WDL  Delusions None reported or observed  Perception WDL  Hallucination None reported or observed  Judgment Impaired  Confusion None  Danger to Self  Current suicidal ideation? Denies  Self-Injurious Behavior No self-injurious ideation or behavior indicators observed or expressed   Agreement Not to Harm Self Yes  Description of Agreement Verbal contract  Danger to Others  Danger to Others None reported or observed

## 2021-07-29 NOTE — [facility] Group Notes (Signed)
Pt did not attend goals group. 

## 2021-07-30 MED ORDER — DULOXETINE HCL 40 MG PO CPEP: 40.0000 mg | ORAL_CAPSULE | Freq: Every day | ORAL | 0 refills | Status: DC

## 2021-07-30 NOTE — Progress Notes (Signed)
Pt discharged to lobby. Friend present in the lobby to transport pt. Pt was stable and appreciative at that time. All papers and prescriptions were given and valuables returned. Verbal understanding expressed. Denies SI/HI and A/VH. Pt given opportunity to express concerns and ask questions.

## 2021-07-30 NOTE — Progress Notes (Signed)
   07/30/21 0800  Psych Admission Type (Psych Patients Only)  Admission Status Voluntary  Psychosocial Assessment  Patient Complaints None  Eye Contact Brief  Facial Expression Animated  Affect Appropriate to circumstance  Speech Logical/coherent  Interaction Assertive  Motor Activity Other (Comment) (WDL)  Appearance/Hygiene Unremarkable  Behavior Characteristics Cooperative;Appropriate to situation  Mood Pleasant;Anxious  Thought Process  Coherency WDL  Content WDL  Delusions None reported or observed  Perception WDL  Hallucination None reported or observed  Judgment Impaired  Confusion None  Danger to Self  Current suicidal ideation? Denies  Self-Injurious Behavior No self-injurious ideation or behavior indicators observed or expressed   Agreement Not to Harm Self Yes  Description of Agreement Verbal contract  Danger to Others  Danger to Others None reported or observed

## 2021-07-30 NOTE — Discharge Summary (Signed)
Physician Discharge Summary Note  Patient:  Evelyn [name] is an 40 y.o., female MRN:  [id number] DOB:  1981-06-02 Patient phone:  [phone] (home)  Patient address:   [address] [place] [address],  Total Time Spent in Direct Patient Care on Day of Discharge: I personally spent 30 minutes on the unit in direct patient care. The direct patient care time included face-to-face time with the patient, reviewing the patient's chart, communicating with other professionals, and coordinating care. Greater than 50% of this time was spent in counseling or coordinating care with the patient regarding goals of hospitalization, psycho-education, and discharge planning needs.   Date of Admission:  07/26/2021 Date of Discharge: 07/30/2021  Reason for Admission:   Per H and P:  Patient is seen and examined.  Patient is a 40 year old female with a reported past psychiatric history significant for depression and a reported past medical history significant for recent herpes zoster, unspecified rheumatological illness which she reports is rheumatoid arthritis as well as systemic lupus and several other connective tissue disorder problems that she claims to have who presented to the [facility] emergency department on 07/24/2021 complaining of having either another lupus flareup or a new episode of shingles.  There was redness noted underneath the right breast, as well as under her right scapula, but there were no open lesions.  She was evaluated by the advanced practice provider she told the emergency room physicians that her shingles outbreaks usually resolve with steroids and Valtrex.  She is on chronic prednisone 10 mg p.o. daily for her connective tissue disorder.  She stated that she was under a lot of stress recently but denied any plans to kill her self.  Social work received a consult that the patient wished to discuss with social work prior to discharge.  The patient stated that she  "did not want to be here".  The patient told her that her husband had passed away in May 15, 2020 and she was all alone.  She stated that her stepfather passed away this previous 2023/02/15, and then another friend also recently passed.  She admitted at that time she had taken an overdose of Tylenol the Saturday prior to admission.  Psychiatric consultation was obtained.  She admitted to multiple issues of depression and the decision was made to admit her to the hospital for evaluation and stabilization.  On examination today she continues to state that she does not want to be here.  She stated she is all alone.  She stated that she has had the love of her life passed away at age 11, that her ex husband had passed away, and then her most recent husband died in 05/15/2020.  She stated that she had a child, but that her mother had taken her child away from her at a very young age, and would not return her to her custody.  She stated she is currently staying with her ex-husband's father, but apparently its next to some apartments that she also lives in.  She stated that she sometimes rotates between houses that her mother owns in separate states.  The electronic medical record revealed multiple hospital visits as well as hospitalizations in [place].  She stated that she had overdosed in the past after her daughter had been taken away from her, but that she had not been treated with antidepressant medicines at discharge.  She stated that she had been on multiple antidepressants in the past and she stated several names of all the  basic antidepressants.  She did not state that she had ever been treated with duloxetine in the past.  She is currently on disability and unemployed.  She has poor social support.  She was admitted to the hospital for evaluation and stabilization.   Principal Problem: Depression Discharge Diagnoses: Principal Problem:   Depression   Past Psychiatric History: Patient admitted to 1 previous psychiatric  hospitalization.  This was an overdose after apparently her mother had taken her child from her.  She stated the mother decided not to return the child to her custody.  She was unable to explain why.  She states she took an overdose at that time.  She stated that she was not discharged on any antidepressant medications.  She stated that she has been on multiple psychiatric medications in the past and none of been effective in most of led to side effects.  Past Medical History:  Past Medical History:  Diagnosis Date   Anemia    Anxiety    Arthritis    rheumatoid   Depression    Headache(784.0)    Hx of chlamydia infection 2012   Hx of gonorrhea 2011   Hx of trichomoniasis 2015   Lupus (HCC)    Lupus nephritis (HCC)    Osteonecrosis (HCC)    bilateral legs, worse in right hip.  D/t lupus   Pericardial effusion    Intermittent. Associated with lupus.    Past Surgical History:  Procedure Laterality Date   DILATION AND EVACUATION  11/10   ESOPHAGOGASTRODUODENOSCOPY Left 10/07/2016   Procedure: ESOPHAGOGASTRODUODENOSCOPY (EGD);  Surgeon: Dorena Cookey, MD;  Location: [facility];  Service: Gastroenterology;  Laterality: Left;   LAPAROSCOPIC TUBAL LIGATION  07/10/2011   Procedure: LAPAROSCOPIC TUBAL LIGATION;  Surgeon: Purcell Nails, MD;  Location: [facility];  Service: Gynecology;  Laterality: Bilateral;  fulguration   Family History:  Family History  Problem Relation Age of Onset   Hypertension Mother    Family Psychiatric  History: She stated that multiple family members have multiple psychiatric illnesses.  She stated that all of her family members hear voices, but there is "nothing wrong with that". Social History:  Social History   Substance and Sexual Activity  Alcohol Use Yes   Alcohol/week: 3.0 standard drinks   Types: 3 Shots of liquor per week     Social History   Substance and Sexual Activity  Drug Use Yes   Frequency: 21.0 times per week   Types: Marijuana   Comment:  3 times per day    Social History   Socioeconomic History   Marital status: Single    Spouse name: Not on file   Number of children: Not on file   Years of education: Not on file   Highest education level: Not on file  Occupational History   Not on file  Tobacco Use   Smoking status: Every Day    Packs/day: 0.25    Years: 7.00    Pack years: 1.75    Types: Cigarettes   Smokeless tobacco: Never  Vaping Use   Vaping Use: Not on file  Substance and Sexual Activity   Alcohol use: Yes    Alcohol/week: 3.0 standard drinks    Types: 3 Shots of liquor per week   Drug use: Yes    Frequency: 21.0 times per week    Types: Marijuana    Comment: 3 times per day   Sexual activity: Yes    Birth control/protection: Condom  Other Topics Concern  Not on file  Social History Narrative   Not on file   Social Determinants of Health   Financial Resource Strain: Not on file  Food Insecurity: Not on file  Transportation Needs: Not on file  Physical Activity: Not on file  Stress: Not on file  Social Connections: Not on file    Hospital Course:   After admission, patient's gabapentin was discontinued due to "it makes me feel weird". Her prior to admission Cymbalta was increased from 30 mg to 40 mg. She tolerated this change well with no side effects. Of note, patient had a TSH of 0.2 on admission, with a T3 of 1.5 and a free T4 of 0.9. OP follow up recommended. On day of discharge patient displayed normal affect and denied SI/AVH. She reported that her mood was improved from talking with so many people. She was able to converse in a linear and logical manner.   During the course of his hospitalization, the 15-minute checks were adequate to ensure patient's safety. The patient did not display any dangerous, violent or suicidal behavior on the unit.  The patient interacted with patients & staff appropriately, participated appropriately in the group sessions/therapies. The patient's medications  were addressed & adjusted to meet his needs. The patient was recommended for outpatient follow-up care & medication management upon discharge to assure continuity of care & mood stability.  At the time of discharge the patient is not reporting any acute suicidal/homicidal ideations. The patient feels more confident about their self-care & in managing their mental health. The patient currently denies any new issues or concerns. Education and supportive counseling provided throughout their hospital stay & upon discharge.   Today upon their discharge evaluation with the attending psychiatrist, the patient shares they are doing well and that they feel ready for discharge. The patient denies any other specific concerns. The patient is sleeping well. Their appetite is good. The patient denies other physical complaints. The patient denies AH/VH, delusional thoughts or paranoia. The patient does not appear to be responding to any internal stimuli. The patient feels that their medications have been helpful & is in agreement to continue their current treatment regimen as recommended. The patient was able to engage in safety planning including plan to return to [facility] [place] or contact emergency services if the patient feels unable to maintain their own safety or the safety of others. Pt had no further questions, comments, or concerns. The patient left [facility] with all personal belongings in no apparent distress. Transportation per [employer].  Disposition: to home/self care   Physical Findings: AIMS: NA CIWA:   NA COWS:   NA  Musculoskeletal: Strength & Muscle Tone: not assessed Gait & Station: normal Patient leans: N/A   Psychiatric Specialty Exam:  Presentation  General Appearance: Disheveled  Eye Contact:Fair  Speech:Clear and Coherent; Normal Rate  Speech Volume:Normal  Handedness:Right   Mood and Affect  Mood:Depressed  Affect:Congruent   Thought Process  Thought Processes:Coherent; Goal  Directed; Linear  Descriptions of Associations:Intact  Orientation:Full (Time, Place and Person)  Thought Content:Logical; WDL  History of Schizophrenia/Schizoaffective disorder:No  Duration of Psychotic Symptoms:No data recorded Hallucinations:Hallucinations: Auditory; Visual Description of Auditory Hallucinations: Patient stated that the "ghost" for conversational, not commanding and do not bother her or impair function. Description of Visual Hallucinations: Patient still sees the "ghosts" of random people, that are conversational, do not bother her or impede her function.  Ideas of Reference:None  Suicidal Thoughts:Suicidal Thoughts: No  Homicidal Thoughts:Homicidal Thoughts: No  Sensorium  Memory:Immediate Good; Recent Good; Remote Fair  Judgment:Fair  Insight:Fair   Executive Functions  Concentration:Good  Attention Span:Good  Recall:Good  Fund of Knowledge:Good  Language:Good   Psychomotor Activity  Psychomotor Activity:Psychomotor Activity: Normal   Assets  Assets:Communication Skills; Desire for Improvement; Housing   Sleep  Sleep:Sleep: Fair Number of Hours of Sleep: 6    Physical Exam: Physical Exam Vitals and nursing note reviewed.  HENT:     Head: Normocephalic and atraumatic.  Pulmonary:     Effort: Pulmonary effort is normal.  Neurological:     General: No focal deficit present.     Mental Status: She is alert and oriented to person, place, and time.   Review of Systems  Respiratory:  Negative for shortness of breath.   Cardiovascular:  Negative for chest pain.  Gastrointestinal:  Negative for nausea and vomiting.  Neurological:  Negative for headaches.  Blood pressure 102/67, pulse 60, temperature 99 F (37.2 C), temperature source Oral, resp. rate 20, height  (1.651 m), SpO2 100 %. Body mass index is 19.47 kg/m.   Social History   Tobacco Use  Smoking Status Every Day   Packs/day: 0.25   Years: 7.00   Pack years:  1.75   Types: Cigarettes  Smokeless Tobacco Never   Tobacco Cessation:  A prescription for an FDA-approved tobacco cessation medication was offered at discharge and the patient refused   Blood Alcohol level:  Lab Results  Component Value Date   [facility] <10 07/24/2021   ETH <11 09/16/2013    Metabolic Disorder Labs:  No results found for: PROLACTIN Lab Results  Component Value Date   CHOL 164 07/27/2021   TRIG 55 07/27/2021   HDL 89 07/27/2021   CHOLHDL 1.8 07/27/2021   VLDL 11 07/27/2021   LDLCALC 64 07/27/2021    See Psychiatric Specialty Exam and Suicide Risk Assessment completed by Attending Physician prior to discharge.  Discharge destination:  Home  Is patient on multiple antipsychotic therapies at discharge:  No   Has Patient had three or more failed trials of antipsychotic monotherapy by history:  No  Recommended Plan for Multiple Antipsychotic Therapies: NA   Allergies as of 07/30/2021       Reactions   Alfalfa Hives, Other (See Comments)   Metoclopramide Hcl Anaphylaxis   Body started contracting, couldn't breathe, throat swelled up   Alpha Blocker Quinazolines Hives, Nausea Only   Aspirin Other (See Comments)   Contraindicated with Lupus   Cyclobenzaprine Diarrhea   Elemental Sulfur Other (See Comments)   Flares up her lupus   Enoxaparin Other (See Comments)   "Spasms of muscles in hands and feet"   Garlic Diarrhea, Nausea And Vomiting   Nsaids Other (See Comments)   Flares up her lupus   Pork-derived Products Diarrhea, Nausea And Vomiting   Tramadol Other (See Comments)   Lupus flare Drug interaction with kidney medicine   Ciprofloxacin Diarrhea, Nausea And Vomiting        Medication List     TAKE these medications      Indication  DULoxetine HCl 40 MG Cpep Take 40 mg by mouth daily. For depression Start taking on: July 31, 2021  Indication: Major Depressive Disorder   hydroxychloroquine 200 MG tablet Commonly known as: PLAQUENIL Take  1 tablet (200 mg total) by mouth daily. What changed: how much to take  Indication: Systemic Lupus Erythematosus   oxyCODONE-acetaminophen 5-325 MG tablet Commonly known as: Percocet Take 1 tablet by mouth every  6 (six) hours as needed.  Indication: Pain   predniSONE 10 MG tablet Commonly known as: DELTASONE Take 10 mg by mouth daily with breakfast. What changed: Another medication with the same name was removed. Continue taking this medication, and follow the directions you see here.  Indication: Systemic Lupus Erythematosus       ASK your doctor about these medications      Indication  valACYclovir 1000 MG tablet Commonly known as: VALTREX Take 1 tablet (1,000 mg total) by mouth 3 (three) times daily.  Indication: Infection caused by the Varicella Zoster Virus        Follow-up Information     [facility], [place]  - [place] Follow up.   Why: for therapy and medication management services Contact information: [address] [place], [place] [address]  Phone: [phone] Fax: [phone]        [facility]. Call.   Why: As needed, Please call to set up with one on one counseling for grief support. They also offer grief support groups and grief workshops. Contact information: [phone] [address] [place], [place] [address]        [facility]. Go to.   Why: Please go to [facility] for follow up with med managment and therapy.  Walk in hours are Monday through Wednesday 8am-11am.  First come, first serve basis.  Recommended to go at 7:45am. If planning to stay in [place] area, please switch medicaid from [place] to [place] in [place]. Contact information: [address]  [place], [place] [address] [phone]        [facility] Follow up.   Why: This agency provides peer-run support groups, classes, and peer supports.  You can look into their services at the website  [employer] Contact information: [place] [address] [place], [place] [address] Phone: [phone]                Follow-up recommendations:   Activity as tolerated. Diet as recommended by PCP. Keep all scheduled follow-up appointments as recommended.  Patient is instructed to take all prescribed medications as recommended. Report any side effects or adverse reactions to your outpatient psychiatrist. Patient is instructed to abstain from alcohol and illegal drugs while on prescription medications. In the event of worsening symptoms, patient is instructed to call the crisis hotline, 911, or go to the nearest emergency department for evaluation and treatment.  Prescriptions given at discharge. Patient agreeable to plan. Given opportunity to ask questions. Appears to feel comfortable with discharge denies any current suicidal or homicidal thought.  Patient is also instructed prior to discharge to: Take all medications as prescribed by mental healthcare provider. Report any adverse effects and or reactions from the medicines to outpatient provider promptly. Patient has been instructed & cautioned: To not engage in alcohol and or illegal drug use while on prescription medicines. In the event of worsening symptoms,  patient is instructed to call the crisis hotline, 911 and or go to the nearest ED for appropriate evaluation and treatment of symptoms. To follow-up with primary care provider for other medical issues, concerns and or health care needs  The patient was evaluated each day by a clinical provider to ascertain response to treatment. Improvement was noted by the patient's report of decreasing symptoms, improved sleep and appetite, affect, medication tolerance, behavior, and participation in unit programming.  Patient was asked each day to complete a self inventory noting mood, mental status, pain, new symptoms,  anxiety and concerns.  Patient responded well to medication and being  in a therapeutic and supportive environment. Positive and appropriate behavior was noted and the patient was motivated for recovery. The patient worked closely with the treatment team and case manager to develop a discharge plan with appropriate goals. Coping skills, problem solving as well as relaxation therapies were also part of the unit programming.  By the day of discharge patient was in much improved condition than upon admission.  Symptoms were reported as significantly decreased or resolved completely. The patient denied SI/HI and voiced no AVH. The patient was motivated to continue taking medication with a goal of continued improvement in mental health.   Patient was discharged home with a plan to follow up as noted below.   Comments:  as above   Signed: Carlyn Reichert PGY-1, Psychiatry

## 2021-07-30 NOTE — [facility] Suicide Risk Assessment (Signed)
[facility] Discharge Suicide Risk Assessment   Principal Problem: Depression Discharge Diagnoses: Principal Problem:   Depression   Total Time spent with patient: 15 minutes  Musculoskeletal: Strength & Muscle Tone: within normal limits Gait & Station: normal Patient leans: N/A  Psychiatric Specialty Exam  Presentation  General Appearance: Disheveled  Eye Contact:Fair  Speech:Clear and Coherent; Normal Rate  Speech Volume:Normal  Handedness:Right   Mood and Affect  Mood:Depressed  Duration of Depression Symptoms: Greater than two weeks  Affect:Congruent   Thought Process  Thought Processes:Coherent; Goal Directed; Linear  Descriptions of Associations:Intact  Orientation:Full (Time, Place and Person)  Thought Content:Logical; WDL  History of Schizophrenia/Schizoaffective disorder:No  Duration of Psychotic Symptoms:No data recorded Hallucinations:Hallucinations: Auditory; Visual Description of Auditory Hallucinations: Patient stated that the "ghost" for conversational, not commanding and do not bother her or impair function. Description of Visual Hallucinations: Patient still sees the "ghosts" of random people, that are conversational, do not bother her or impede her function.  Ideas of Reference:None  Suicidal Thoughts:Suicidal Thoughts: No  Homicidal Thoughts:Homicidal Thoughts: No   Sensorium  Memory:Immediate Good; Recent Good; Remote Fair  Judgment:Fair  Insight:Fair   Executive Functions  Concentration:Good  Attention Span:Good  Recall:Good  Fund of Knowledge:Good  Language:Good   Psychomotor Activity  Psychomotor Activity:Psychomotor Activity: Normal   Assets  Assets:Communication Skills; Desire for Improvement; Housing   Sleep  Sleep:Sleep: Fair Number of Hours of Sleep: 6   Physical Exam: Physical Exam Vitals and nursing note reviewed.  Constitutional:      Appearance: Normal appearance.  HENT:     Head: Normocephalic  and atraumatic.  Pulmonary:     Effort: Pulmonary effort is normal.  Neurological:     General: No focal deficit present.     Mental Status: She is alert and oriented to person, place, and time.   Review of Systems  Musculoskeletal:  Positive for myalgias.  Blood pressure 102/67, pulse 60, temperature 99 F (37.2 C), temperature source Oral, resp. rate 20, height 5\' 5"  (1.651 m), SpO2 100 %. Body mass index is 19.47 kg/m.  Mental Status Per Nursing Assessment::   On Admission:  Suicidal ideation indicated by patient  Demographic Factors:  Divorced or widowed, Low socioeconomic status, and Unemployed  Loss Factors: Loss of significant relationship  Historical Factors: Impulsivity  Risk Reduction Factors:   Living with another person, especially a relative and Positive social support  Continued Clinical Symptoms:  Depression:   Impulsivity More than one psychiatric diagnosis Previous Psychiatric Diagnoses and Treatments Medical Diagnoses and Treatments/Surgeries  Cognitive Features That Contribute To Risk:  None    Suicide Risk:  Minimal: No identifiable suicidal ideation.  Patients presenting with no risk factors but with morbid ruminations; may be classified as minimal risk based on the severity of the depressive symptoms   Follow-up Information     [facility], [place]  - [place] Follow up.   Why: for therapy and medication management services Contact information: [address] [place], [place]  Phone: [phone] Fax: [phone]        [facility]. Call.   Why: As needed, Please call to set up with one on one counseling for grief support. They also offer grief support groups and grief workshops. Contact information: [phone] [address] [place], [place]        [facility]. Go to.   Why: Please go to [facility] for follow up with med managment and therapy.  Walk in  hours are Monday through Wednesday 8am-11am.  First come, first serve basis.  Recommended to go at 7:45am. If planning to stay in [place] area, please switch medicaid from [place] to [place] in [place]. Contact information: [address]  [place], [place] [address] [phone]                Plan Of Care/Follow-up recommendations:  Activity:  ad lib  Antonieta Pert, MD 07/30/2021, 9:16 AM

## 2021-07-30 NOTE — Progress Notes (Signed)
  [facility] Adult Case Management Discharge Plan :  Will you be returning to the same living situation after discharge:  No.  Going to stay with her friend Evelyn [name] a few weeks At discharge, do you have transportation home?: Yes,  friend Evelyn Polite Do you have the ability to pay for your medications: Yes,  Medicaid  and disability income  Release of information consent forms completed and emailed to Medical Records, then turned in to Medical Records by CSW.   Patient to Follow up at:  Follow-up Information     [facility], [place]  - [place] Follow up.   Why: for therapy and medication management services Contact information: [address] [place], [place] [address]  Phone: [phone] Fax: [phone]        [facility]. Call.   Why: As needed, Please call to set up with one on one counseling for grief support. They also offer grief support groups and grief workshops. Contact information: [phone] [address] [place], [place] [address]        [facility]. Go to.   Why: Please go to [facility] for follow up with med managment and therapy.  Walk in hours are Monday through Wednesday 8am-11am.  First come, first serve basis.  Recommended to go at 7:45am. If planning to stay in [place] area, please switch medicaid from [place] to [place] in [place]. Contact information: [address]  [place], [place] [address] [phone]                Next level of care provider has access to [facility] Link:no  Safety Planning and Suicide Prevention discussed: Yes,  with friend Evelyn [name]     Has patient been referred to the Quitline?: N/A patient is not a smoker  Patient has been referred for addiction treatment: N/A  Evelyn Chad, LCSW 07/30/2021, 9:32 AM

## 2021-07-30 NOTE — [facility] Group Notes (Signed)
[facility] LCSW Group Therapy Note  07/30/2021    Type of Therapy and Topic:  Group Therapy:  A Hero Worthy of Support  Participation Level:  Did Not Attend   Description of Group:  Patients in this group were introduced to the concept that additional supports including self-support are an essential part of recovery.  Matching needs with supports to help fulfill those needs was explained.  Establishing boundaries that can gradually be increased or decreased was described, with patients giving their own examples of establishing appropriate boundaries in their lives.  A song entitled "My Own Hero" was played and a group discussion ensued in which patients stated it inspired them to help themselves in order to succeed, because other people cannot achieve their goals such as sobriety or stability for them.  A song was played called "I Am Enough" which led to a discussion about being willing to believe we are worth the effort of being a self-support.   Therapeutic Goals: 1)  demonstrate the importance of being a key part of one's own support system 2)  discuss various available supports 3)  encourage patient to use music as part of their self-support and focus on goals 4)  elicit ideas from patients about supports that need to be added   Summary of Patient Progress:  The patient was invited to group, did not attend.  Therapeutic Modalities:   Motivational Interviewing Activity  Evelyn [name]      

## 2021-11-27 ENCOUNTER — Ambulatory Visit: Payer: Medicaid - Out of State | Admitting: Nurse Practitioner

## 2022-02-07 DIAGNOSIS — Z139 Encounter for screening, unspecified: Secondary | ICD-10-CM | POA: Diagnosis not present

## 2022-02-07 DIAGNOSIS — Z113 Encounter for screening for infections with a predominantly sexual mode of transmission: Secondary | ICD-10-CM | POA: Diagnosis not present

## 2022-02-07 DIAGNOSIS — E049 Nontoxic goiter, unspecified: Secondary | ICD-10-CM | POA: Diagnosis not present

## 2022-02-07 DIAGNOSIS — N760 Acute vaginitis: Secondary | ICD-10-CM | POA: Diagnosis not present

## 2022-02-07 DIAGNOSIS — Z0000 Encounter for general adult medical examination without abnormal findings: Secondary | ICD-10-CM | POA: Diagnosis not present

## 2022-02-07 DIAGNOSIS — Z01419 Encounter for gynecological examination (general) (routine) without abnormal findings: Secondary | ICD-10-CM | POA: Diagnosis not present

## 2022-02-07 DIAGNOSIS — R350 Frequency of micturition: Secondary | ICD-10-CM | POA: Diagnosis not present

## 2022-02-07 DIAGNOSIS — Z8742 Personal history of other diseases of the female genital tract: Secondary | ICD-10-CM | POA: Diagnosis not present

## 2022-02-08 ENCOUNTER — Other Ambulatory Visit: Payer: Self-pay | Admitting: Obstetrics and Gynecology

## 2022-02-08 DIAGNOSIS — E049 Nontoxic goiter, unspecified: Secondary | ICD-10-CM

## 2022-02-19 ENCOUNTER — Other Ambulatory Visit: Payer: Medicaid - Out of State

## 2022-03-08 ENCOUNTER — Emergency Department (INDEPENDENT_AMBULATORY_CARE_PROVIDER_SITE_OTHER)
Admission: EM | Admit: 2022-03-08 | Discharge: 2022-03-08 | Disposition: A | Discharge status: Home / Self Care | Payer: Medicaid Other | Source: Home / Self Care

## 2022-03-08 ENCOUNTER — Other Ambulatory Visit: Payer: Self-pay

## 2022-03-08 DIAGNOSIS — R630 Anorexia: Secondary | ICD-10-CM

## 2022-03-08 DIAGNOSIS — R519 Headache, unspecified: Secondary | ICD-10-CM

## 2022-03-08 DIAGNOSIS — F1910 Other psychoactive substance abuse, uncomplicated: Secondary | ICD-10-CM | POA: Insufficient documentation

## 2022-03-08 DIAGNOSIS — M329 Systemic lupus erythematosus, unspecified: Secondary | ICD-10-CM | POA: Diagnosis not present

## 2022-03-08 DIAGNOSIS — R4189 Other symptoms and signs involving cognitive functions and awareness: Secondary | ICD-10-CM

## 2022-03-08 DIAGNOSIS — R825 Elevated urine levels of drugs, medicaments and biological substances: Secondary | ICD-10-CM

## 2022-03-08 MED ORDER — PREDNISONE 5 MG PO TABS: 5.0000 mg | ORAL_TABLET | Freq: Every day | ORAL | 0 refills | Status: DC

## 2022-03-08 MED ORDER — ONDANSETRON 4 MG PO TBDP
4.0000 mg | ORAL_TABLET | Freq: Once | ORAL | Status: AC
  Administered 2022-03-08: 13:00:00 4 mg via ORAL

## 2022-03-08 MED ORDER — KETOROLAC TROMETHAMINE 30 MG/ML IJ SOLN
30.0000 mg | Freq: Once | INTRAMUSCULAR | Status: AC
  Administered 2022-03-08: 13:00:00 30 mg via INTRAMUSCULAR

## 2022-03-08 MED ORDER — ONDANSETRON HCL 4 MG PO TABS: 4.0000 mg | ORAL_TABLET | Freq: Four times a day (QID) | ORAL | 0 refills | Status: DC

## 2022-03-08 NOTE — ED Triage Notes (Signed)
Pt states that she has a headache, body aches, and a rash on her face. X5 days ? ?Pt states that she does have Lupus. ?Pt states that she has a switch of insurance so she's been without her medication. ? ?Pt states that she just found a new pcp but can't get in for 1 more week. ?Pt states that she isn't vaccinated for covid. ?Pt states that she isn't vaccinated for Flu. ?

## 2022-03-08 NOTE — Discharge Instructions (Signed)
It is important that you keep your appointment for the new primary care doctor on 03/13/2021 ? ?I am going to refill the prednisone at the same dose that Dr. Jonah Blue was giving you.  5 mg a day. ?I am going to provide a prescription for Zofran for nausea and vomiting ? ?Other medicines will have to wait for your primary care doctor ?
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
## 2022-03-13 ENCOUNTER — Ambulatory Visit: Payer: Medicaid Other | Admitting: Nurse Practitioner

## 2022-03-14 ENCOUNTER — Ambulatory Visit: Payer: Medicaid Other | Admitting: Nurse Practitioner

## 2022-04-05 ENCOUNTER — Ambulatory Visit: Payer: Medicaid Other | Admitting: Physician Assistant

## 2022-04-19 ENCOUNTER — Emergency Department (HOSPITAL_COMMUNITY)
Admission: EM | Admit: 2022-04-19 | Discharge: 2022-04-20 | Disposition: A | Discharge status: Other Acute Inpatient Hospital | Payer: Medicaid Other | Attending: Emergency Medicine | Admitting: Emergency Medicine

## 2022-04-19 ENCOUNTER — Other Ambulatory Visit: Payer: Self-pay

## 2022-04-19 ENCOUNTER — Encounter (HOSPITAL_COMMUNITY): Payer: Self-pay | Admitting: Emergency Medicine

## 2022-04-19 DIAGNOSIS — R45851 Suicidal ideations: Secondary | ICD-10-CM | POA: Insufficient documentation

## 2022-04-19 DIAGNOSIS — Z20822 Contact with and (suspected) exposure to covid-19: Secondary | ICD-10-CM | POA: Insufficient documentation

## 2022-04-19 DIAGNOSIS — R440 Auditory hallucinations: Secondary | ICD-10-CM | POA: Insufficient documentation

## 2022-04-19 DIAGNOSIS — F332 Major depressive disorder, recurrent severe without psychotic features: Secondary | ICD-10-CM | POA: Diagnosis not present

## 2022-04-19 DIAGNOSIS — D649 Anemia, unspecified: Secondary | ICD-10-CM | POA: Diagnosis not present

## 2022-04-19 LAB — CBC
HCT: 36.2 % (ref 36.0–46.0)
Hemoglobin: 11.3 g/dL — ABNORMAL LOW (ref 12.0–15.0)
MCH: 29.4 pg (ref 26.0–34.0)
MCHC: 31.2 g/dL (ref 30.0–36.0)
MCV: 94 fL (ref 80.0–100.0)
Platelets: 305 10*3/uL (ref 150–400)
RBC: 3.85 MIL/uL — ABNORMAL LOW (ref 3.87–5.11)
RDW: 12.2 % (ref 11.5–15.5)
WBC: 5.6 10*3/uL (ref 4.0–10.5)
nRBC: 0 % (ref 0.0–0.2)

## 2022-04-19 LAB — COMPREHENSIVE METABOLIC PANEL
ALT: 17 U/L (ref 0–44)
AST: 21 U/L (ref 15–41)
Albumin: 4.3 g/dL (ref 3.5–5.0)
Alkaline Phosphatase: 67 U/L (ref 38–126)
Anion gap: 6 (ref 5–15)
BUN: 8 mg/dL (ref 6–20)
CO2: 29 mmol/L (ref 22–32)
Calcium: 9.4 mg/dL (ref 8.9–10.3)
Chloride: 108 mmol/L (ref 98–111)
Creatinine, Ser: 0.69 mg/dL (ref 0.44–1.00)
GFR, Estimated: >60 mL/min (ref >60)
Glucose, Bld: 87 mg/dL (ref 70–99)
Potassium: 3.5 mmol/L (ref 3.5–5.1)
Sodium: 143 mmol/L (ref 135–145)
Total Bilirubin: 0.3 mg/dL (ref 0.3–1.2)
Total Protein: 8.8 g/dL — ABNORMAL HIGH (ref 6.5–8.1)

## 2022-04-19 LAB — RAPID URINE DRUG SCREEN, HOSP PERFORMED
Amphetamines: NOT DETECTED
Barbiturates: NOT DETECTED
Benzodiazepines: POSITIVE — AB
Cocaine: POSITIVE — AB
Opiates: NOT DETECTED
Tetrahydrocannabinol: POSITIVE — AB

## 2022-04-19 LAB — ETHANOL: Alcohol, Ethyl (B): <10 mg/dL (ref <10)

## 2022-04-19 LAB — SALICYLATE LEVEL: Salicylate Lvl: <7 mg/dL — ABNORMAL LOW (ref 7.0–30.0)

## 2022-04-19 LAB — RESP PANEL BY RT-PCR (FLU A&B, COVID) ARPGX2
Influenza A by PCR: NEGATIVE
Influenza B by PCR: NEGATIVE
SARS Coronavirus 2 by RT PCR: NEGATIVE

## 2022-04-19 LAB — ACETAMINOPHEN LEVEL: Acetaminophen (Tylenol), Serum: <10 ug/mL — ABNORMAL LOW (ref 10–30)

## 2022-04-19 LAB — I-STAT BETA HCG BLOOD, ED (MC, WL, AP ONLY): I-stat hCG, quantitative: <5 m[IU]/mL (ref <5)

## 2022-04-19 MED ORDER — OLANZAPINE 10 MG PO TBDP
10.0000 mg | ORAL_TABLET | Freq: Every day | ORAL | Status: DC
  Administered 2022-04-19 (×2): 10 mg via ORAL
  Filled 2022-04-19 (×2): qty 1

## 2022-04-19 NOTE — BH Assessment (Signed)
Comprehensive Clinical Assessment (CCA) Note ? ?04/19/2022 ?Evelyn [name] ?[id number] ? ?Disposition: Lindon Romp, PHMNP recommends inpatient treatment. AC to review. If no beds available disposition CSW to seek placement. Disposition discussed with Evelyn [name] T. Brigitte Pulse, RN.  ? ?[place] ED from 04/19/2022 in [place] DEPT ED from 03/08/2022 in [facility] Ur[facility] Care at [facility] Admission (Discharged) from 07/26/2021 in [place] [facility]  ?C-SSRS RISK CATEGORY High Risk No Risk High Risk  ? ?  ? ?The patient demonstrates the following risk factors for suicide: Chronic risk factors for suicide include: psychiatric disorder of Major Depressive Disorder, recurrent, severe without psychosis, substance use disorder, and previous suicide attempts Pt reports multiple times  . Acute risk factors for suicide include:  homelessness, grief/loss . Protective factors for this patient include:  None . Considering these factors, the overall suicide risk at this point appears to be high. Patient is appropriate for outpatient follow up. ? ?Evelyn [name] is a 41 year old female who presents involuntary and unaccompanied [facility]. Clinician asked the pt, "what brought you to the hospital?" Pt reports, "my moods." Clinician asked the pt to describe her moods, pt replied, "shitty for the past couple of weeks." Pt reports, the past couple of weeks she became homeless after her apartment complex got new management and they did not renew her lease, she's always in pain due to Lupus and currently does not feel well. Pt reports, she has a Section 8 Voucher but no one wants to take Section 8 because they fear their property will get destroyed. Pt reports, she called different places and was told to go to a website to look for properties and got scammed out of $100.00. Pt reports, last night as a suicide attempt she walked in the front of a car but all the cars went around her. Pt  denies, HI, AVH, self-injurious behaviors and access to weapons.  ? ?Pt was IVC'd by EDP. Per IVC paperwork: "Danger to self. Found trying to step into traffic tonight in effort to kill herself. Suicidal with plan as above or to overdose."  ? ?Per pt, she smoked Marijuana yesterday morning. Pt reports, last night she took a white Xanax bar and takes pain pills (pt does not know the names of the pills) to manage her pain. Pt's UDS is positive for Benzodiazepines, Cocaine and Marijuana. Pt denies, being linked to OPT resources (medication management and/or counseling.) Pt reports, she had a previous inpatient admission at [facility].  ? ?Initially, pt had covers over he to prevent from engaging once her nurse re-engaged the pt she was tearful at times with soft speech. Pt's mood was depressed, hopeless. Pt's affect was depressed. Pt's insight was fair. Pt's judgement was poor. Clinician asked the pt does she feel safe if discharged, pt replied, "I'm tired, no place to live, what am I supposed to do, I want it to stop."  ? ?Diagnosis: Major Depressive Disorder, recurrent, severe without psychosis.  ? ?*Pt denies having supports.* ? ?Chief Complaint:  ?Chief Complaint  ?Patient presents with  ? Suicidal  ? ?Visit Diagnosis:   ? ? ?CCA Screening, Triage and Referral (STR) ? ?Patient Reported Information ?How did you hear about [place]? Family/Friend ? ?What Is the Reason for Your Visit/Call Today? Per EDP/PA note: "is a 40 y.o. female who presents with suicidal ideation after suicide attempt this evening, walking in traffic. She expresses clear disappointment that all of the cars "went around to me". She was  found by a friend on the road and brought to the emergency department. Patient states that she has gone through the death of her boyfriend and has been hopeless. States that she does not want to live anymore. States that she just "wants to take the medicine and go to sleep forever and never wake up". States that she drink  alcohol, used cocaine marijuana, Xanax, and nicotine this evening all in attempt to escape her emotion. She states that she just wants to be allowed to jump off of a bridge or overdose so that she does not have to live anymore. History of suicidal ideation and attempt in the past, psychiatric admission in July 2022. States she does not take her behavioral health medications as they make her feel too groggy." ? ?How Long Has This Been Causing You Problems? > than 6 months ? ?What Do You Feel Would Help You the Most Today? Treatment for Depression or other mood problem; Alcohol or Drug Use Treatment; Housing Assistance; Stress Management; Social Support ? ? ?Have You Recently Had Any Thoughts About Hurting Yourself? Yes ? ?Are You Planning to Commit Suicide/Harm Yourself At This time? Yes ? ? ?Have you Recently Had Thoughts About [place]? No ? ?Are You Planning to Harm Someone at This Time? No ? ?Explanation: No data recorded ? ?Have You Used Any Alcohol or Drugs in the Past 24 Hours? Yes ? ?How Long Ago Did You Use Drugs or Alcohol? No data recorded ?What Did You Use and How Much? Pt is unsure how much she used. ? ? ?Do You Currently Have a Therapist/Psychiatrist? No ? ?Name of Therapist/Psychiatrist: No data recorded ? ?Have You Been Recently Discharged From Any Office Practice or Programs? No ? ?Explanation of Discharge From Practice/Program: No data recorded ? ?  ?CCA Screening Triage Referral Assessment ?Type of Contact: Tele-Assessment ? ?Telemedicine Service Delivery: Telemedicine service delivery: This service was provided via telemedicine using a 2-way, interactive audio and video technology ? ?Is this Initial or Reassessment? Initial Assessment ? ?Date Telepsych consult ordered in [facility]:  04/19/22 ? ?Time Telepsych consult ordered in [facility]:  [place] ? ?Location of Assessment: [facility] ? ?Provider Location: [facility] Assessment Services ? ? ?Collateral Involvement: Pt denies having supports. ? ? ?Does Patient  Have a Stage manager Guardian? No data recorded ?Name and Contact of Legal Guardian: No data recorded ?If Minor and Not Living with Parent(s), Who has Custody? NA ? ?Is CPS involved or ever been involved? Never ? ?Is APS involved or ever been involved? Never ? ? ?Patient Determined To Be At Risk for Harm To Self or Others Based on Review of Patient Reported Information or Presenting Complaint? Yes, for Self-Harm ? ?Method: No data recorded ?Availability of Means: No data recorded ?Intent: No data recorded ?Notification Required: No data recorded ?Additional Information for Danger to Others Potential: No data recorded ?Additional Comments for Danger to Others Potential: No data recorded ?Are There Guns or Other Weapons in [place]? No data recorded ?Types of Guns/Weapons: No data recorded ?Are These Weapons Safely Secured?                            No data recorded ?Who Could Verify You Are Able To Have These Secured: No data recorded ?Do You Have any Outstanding Charges, Pending Court Dates, Parole/Probation? No data recorded ?Contacted To Inform of Risk of Harm To Self or Others: Unable to Contact: ? ? ? ?  Does Patient Present under Involuntary Commitment? Yes ? ?IVC Papers Initial File Date: 04/19/22 ? ? ?[place] of Residence: Kathleen Argue ? ? ?Patient Currently Receiving the Following Services: Not Receiving Services ? ? ?Determination of Need: Emergent (2 hours) ? ? ?Options For Referral: Inpatient Hospitalization ? ? ? ? ?CCA Biopsychosocial ?Patient Reported Schizophrenia/Schizoaffective Diagnosis in Past: No ? ? ?Strengths: Pt is motivated for treatment ? ? ?Mental Health Symptoms ?Depression:   ?Irritability; Hopelessness; Fatigue; Tearfulness; Sleep (too much or little); Increase/decrease in appetite; Worthlessness ?  ?Duration of Depressive symptoms:    ?Mania:   ?None ?  ?Anxiety:    ?Worrying; Tension ?  ?Psychosis:   ?None ?  ?Duration of Psychotic symptoms:    ?Trauma:   ?None ?  ?Obsessions:    ?None ?  ?Compulsions:   ?None ?  ?Inattention:   ?Disorganized; Forgetful; Loses things ?  ?Hyperactivity/Impulsivity:   ?None ?  ?Oppositional/Defiant Behaviors:   ?Angry ?  ?Emotional Irregularity:   ?Re

## 2022-04-19 NOTE — ED Notes (Signed)
Security has in been informed that the IVC papers have been started they stated to let them know when it has been signed from the magistrate, and then they will help out with getting the pts sweater.  ?

## 2022-04-19 NOTE — BH Assessment (Signed)
Clinician messaged Beth A. Jannifer Franklin, Therapist, sports and [name] T. Brigitte Pulse, RN: "Hey. It's Trey with TTS. Is the pt able to engage in the assessment, if so the pt will need to be placed in a private room. Also is the pt under IVC? Can you fax the IVC paperwork to [phone]."  ? ?Clinician awaiting response.  ? ? ?Vertell Novak, MS, [facility], [facility] ?Triage Specialist ?[phone] ? ?

## 2022-04-19 NOTE — ED Notes (Signed)
Pt refuse to remove all clothing. Pt have been info of hospital rules. Pt state " I am not taking my fucking sweater off"  ?

## 2022-04-19 NOTE — ED Notes (Signed)
TTS eval complete. Pt given Mal[place] sandwich. ?

## 2022-04-19 NOTE — ED Provider Notes (Signed)
Patient became agitated because she did not want to take off her sweatshirt. She offered to take the string out of her hoodie but was told that all pt belongings had to be removed. When I arrive the patient was being held down by security and nursing staff was asking for physical restraints. I was able to calm the patient and staff let her go. I discussed that I would give her some medicine to help  her calm down. I offered her oral Zyprexa. Patient reiterated that she is sick of being alive and that she always feels bad. She has a hx of SLE.  ?Patient took off hoodie and she was given warm blanket. ?  ?Arthor CaptainHarris, Trinika Cortese, PA-C ?04/19/22 0720 ? ?  ?Lorre NickAllen, Anthony, MD ?04/19/22 1057 ? ?

## 2022-04-19 NOTE — ED Provider Notes (Signed)
?[facility]-EMERGENCY DEPT ?Provider Note ? ? ?CSN: [id number] ?Arrival date & time: 04/19/22  0454 ? ?  ? ?History ? ?Chief Complaint  ?Patient presents with  ? Suicidal  ? ? ?Evelyn [name] is a 41 y.o. female who presents with suicidal ideation after suicide attempt this evening, walking in traffic.  She expresses clear disappointment that all of the cars "went around to me".  She was found by a friend on the road and brought to the emergency department.  Patient states that she has gone through the death of her boyfriend and has been hopeless.  States that she does not want to live anymore.  States that she just "wants to  take the medicine and go to sleep forever and never wake up".  States that she drink alcohol, used cocaine marijuana, Xanax, and nicotine this evening all in attempt to escape her emotion,. ? ?She states that she just wants to be allowed to jump off of a bridge or overdose so that she does not have to live anymore. ? ?History of suicidal ideation and attempt in the past, psychiatric admission in July 2022.  States she does not take her behavioral health medications as they make her feel too groggy. ? ?HPI ? ?  ? ?Home Medications ?Prior to Admission medications   ?Medication Sig Start Date End Date Taking? Authorizing Provider  ?BIOTIN MAXIMUM STRENGTH 10000 MCG TABS Take 1 tablet by mouth daily. 12/07/21   [provider]  ?DULoxetine HCl 40 MG CPEP Take 40 mg by mouth daily. For depression 07/31/21   Armandina Stammer I, NP  ?hydroxychloroquine (PLAQUENIL) 200 MG tablet Take 1 tablet (200 mg total) by mouth daily. 04/02/20   Linwood Dibbles, MD  ?ondansetron (ZOFRAN) 4 MG tablet Take 1 tablet (4 mg total) by mouth every 6 (six) hours. 03/08/22   Eustace Moore, MD  ?predniSONE (DELTASONE) 5 MG tablet Take 1 tablet (5 mg total) by mouth daily with breakfast. 03/08/22   Eustace Moore, MD  ?   ? ?Allergies    ?Alfalfa, Metoclopramide hcl, Alpha blocker quinazolines,  Aspirin, Cyclobenzaprine, Elemental sulfur, Enoxaparin, Garlic, Nsaids, Pork-derived products, Tramadol, and Ciprofloxacin   ? ?Review of Systems   ?Review of Systems  ?Psychiatric/Behavioral:  Positive for suicidal ideas.   ?     AVH  ? ?Physical Exam ?Updated Vital Signs ?BP 112/89 (BP Location: Left Arm)   Pulse 87   Temp 98.3 ?F (36.8 ?C)   Resp 20   Ht 5\' 4"  (1.626 m)   Wt 54.4 kg   SpO2 99%   BMI 20.60 kg/m?  ?Physical Exam ?Vitals and nursing note reviewed.  ?Constitutional:   ?   Appearance: She is not ill-appearing or toxic-appearing.  ?HENT:  ?   Head: Normocephalic and atraumatic.  ?   Mouth/Throat:  ?   Mouth: Mucous membranes are moist.  ?   Pharynx: No oropharyngeal exudate or posterior oropharyngeal erythema.  ?Eyes:  ?   General:     ?   Right eye: No discharge.     ?   Left eye: No discharge.  ?   Extraocular Movements: Extraocular movements intact.  ?   Conjunctiva/sclera: Conjunctivae normal.  ?   Pupils: Pupils are equal, round, and reactive to light.  ?Cardiovascular:  ?   Rate and Rhythm: Normal rate and regular rhythm.  ?   Pulses: Normal pulses.  ?   Heart sounds: Normal heart sounds. No murmur heard. ?Pulmonary:  ?  Effort: Pulmonary effort is normal. No respiratory distress.  ?   Breath sounds: Normal breath sounds. No wheezing or rales.  ?Abdominal:  ?   General: Bowel sounds are normal. There is no distension.  ?   Palpations: Abdomen is soft.  ?   Tenderness: There is no abdominal tenderness. There is no guarding or rebound.  ?Musculoskeletal:     ?   General: No deformity.  ?   Cervical back: Neck supple.  ?Skin: ?   General: Skin is warm and dry.  ?   Capillary Refill: Capillary refill takes less than 2 seconds.  ?Neurological:  ?   General: No focal deficit present.  ?   Mental Status: She is alert and oriented to person, place, and time. Mental status is at baseline.  ?Psychiatric:     ?   Attention and Perception: She perceives auditory hallucinations. She does not perceive  visual hallucinations.     ?   Mood and Affect: Mood normal. Affect is flat and tearful.     ?   Thought Content: Thought content includes suicidal ideation. Thought content does not include homicidal ideation. Thought content includes suicidal plan.  ?   Comments: Appears to be responding to visual internal stimuli.   ? ? ?ED Results / Procedures / Treatments   ?Labs ?(all labs ordered are listed, but only abnormal results are displayed) ?Labs Reviewed  ?COMPREHENSIVE METABOLIC PANEL - Abnormal; Notable for the following components:  ?    Result Value  ? Total Protein 8.8 (*)   ? All other components within normal limits  ?SALICYLATE LEVEL - Abnormal; Notable for the following components:  ? Salicylate Lvl <7.0 (*)   ? All other components within normal limits  ?ACETAMINOPHEN LEVEL - Abnormal; Notable for the following components:  ? Acetaminophen (Tylenol), Serum <10 (*)   ? All other components within normal limits  ?CBC - Abnormal; Notable for the following components:  ? RBC 3.85 (*)   ? Hemoglobin 11.3 (*)   ? All other components within normal limits  ?RAPID URINE DRUG SCREEN, HOSP PERFORMED - Abnormal; Notable for the following components:  ? Cocaine POSITIVE (*)   ? Benzodiazepines POSITIVE (*)   ? Tetrahydrocannabinol POSITIVE (*)   ? All other components within normal limits  ?RESP PANEL BY RT-PCR (FLU A&B, COVID) ARPGX2  ?ETHANOL  ?I-STAT BETA HCG BLOOD, ED ([facility], [facility], [facility] ONLY)  ? ? ?EKG ?None ? ?Radiology ?No results found. ? ?Procedures ?Procedures  ? ? ?Medications Ordered in ED ?Medications - No data to display ? ?ED Course/ Medical Decision Making/ A&P ?Clinical Course as of 04/19/22 0646  ?Thu Apr 19, 2022  ?0533 Cocaine, xanax, etoh, marijauana, nicotine [RS]  ?  ?Clinical Course User Index ?[RS] Chelsae Zanella, Eugene Gavia, PA-C  ? ?                        ?Medical Decision Making ?41 year old female who presents with suicidality endorsing AVH. ? ?Vital signs normal intake.  Cardiopulmonary abdominal  exams are benign.  Patient is neurovascular intact in the extremities.  Does appear to be returning to internal stimuli, tearful affect, consistently and voicing suicidal ideation with plan. ? ?IVC in place, patient is danger to herself.   ? ?Amount and/or Complexity of Data Reviewed ?Labs: ordered. ?   Details: CBC with mild anemia with hemoglobin of 11 near patient's baseline.  CMP unremarkable, ethanol, acetaminophen, salicylate levels normal.  Patient is  not pregnant.  UDS is positive for cocaine, benzodiazepines, marijuana. ?ECG/medicine tests:  ?   Details: EKG with borderline QTc prolongation with QTc of 501, otherwise unremarkable. ? ? ?Patient is medically cleared at this time awaiting TTS evaluation.  IVC remains in place.  Disposition pending psych recommendation. ? ?This chart was dictated using voice recognition software, Dragon. Despite the best efforts of this provider to proofread and correct errors, errors may still occur which can change documentation meaning. ? ?Final Clinical Impression(s) / ED Diagnoses ?Final diagnoses:  ?None  ? ? ?Rx / DC Orders ?ED Discharge Orders   ? ? None  ? ?  ? ? ?  ?Paris LoreSponseller, Avyay Coger R, PA-C ?04/19/22 [id number] ? ?  ?Marily MemosMesner, Jason, MD ?04/29/22 2359 ? ?

## 2022-04-19 NOTE — BH Assessment (Addendum)
Clinician attempted to complete patient's initial TTS assessment in person @ approximately 1030. Clinician called patient by name multiple times, no response. Clinician notified the nurse tech and patient's nurse of the failed attempts to assess patient. Requested staff to notify TTS when patient awakens and is ready to be seen by TTS. Clinician provided updates to Dr. Lucianne Muss whom states that patient will need inpatient treatment and a CCA when she awakens.  ? ?@1531 , patient's nurse notified this Clinician that patient is alert for her TTS assessment. Clinician informed nursing that this Clinician is unavailable due to [facility]  walk-ins and still completing documentation on the surge of patient's seen earlier today. Clinician informed patient's nurse that patient will  be seen once a Clinician becomes available from [facility] or the  [facility].   ?

## 2022-04-19 NOTE — Progress Notes (Signed)
Inpatient Behavioral Health Placement ? ?Pt meets inpatient criteria per Nira Conn, NP.  There are no available beds at [facility] per [facility] Michael, RN. Referral was sent to the following facilities;  ? ? ?Service Provider Address Phone Fax  ?[facility]  [address] [place] [place] [address] [phone]  ?[facility]  [address], [place] [address] [phone]  ?[facility]  [address] [place] [address] [phone]  ?[facility]  [address] [place], [place] [address] [phone]  ?[facility] [place] [address] [phone]  ?[facility]  [address] [place], [place] [address] [phone]  ?[facility]  [address] [place] [address] [phone]  ?[facility]  [address], [place] [address] [phone]  ?[facility]  [address] [place] [address] [phone]  ?[facility]  [address] [place] [address] [phone]  ?[facility]  [address] [place] [place] [address] [phone]  ?[facility]  [address] [place] [address] [phone]  ?[facility]  [address] [place], [place] [address] [phone]  ?[facility]  [address] [place], [place] [address] [phone]  ?[facility]  [address] [place] [address] [phone]  ?[facility]  [address], [place] [address] [phone]  ?[facility]  [address] [place] [address] [phone]  ?[facility]      ? ?Situation ongoing,  CSW will follow up. ? ? ?Maryjean Ka, MSW, LCSWA ?04/19/2022  @ 10:42 PM; ? ?

## 2022-04-19 NOTE — ED Notes (Signed)
Patient to room 29.  Patient cooperative.  Patient drowsy. Patient resting comfortably.  ?

## 2022-04-19 NOTE — ED Notes (Signed)
Tech tried multiple times to explain to the pt the protocol  for personal belongings, pt started yelling at tech "Im not taking my fucking hoodie off". Security was at bed side and also tried talking to pt. Pt still refused to take it off and started to act out and tried to exit room, security stopped her and got her back into the bed. Night tech was unsuccessful to get hoodie.  ?

## 2022-04-19 NOTE — ED Triage Notes (Signed)
Patient is complaining of she wants to jump off a bridge and kill herself. ?

## 2022-04-19 NOTE — ED Notes (Addendum)
Pt belongings: jean, white shirt, black bra, converse shoe, one black cell phone. PT refuse to remove sweater ?

## 2022-04-20 DIAGNOSIS — Z87891 Personal history of nicotine dependence: Secondary | ICD-10-CM | POA: Diagnosis not present

## 2022-04-20 DIAGNOSIS — M329 Systemic lupus erythematosus, unspecified: Secondary | ICD-10-CM | POA: Diagnosis not present

## 2022-04-20 DIAGNOSIS — R45851 Suicidal ideations: Secondary | ICD-10-CM | POA: Diagnosis not present

## 2022-04-20 DIAGNOSIS — F4321 Adjustment disorder with depressed mood: Secondary | ICD-10-CM | POA: Diagnosis not present

## 2022-04-20 DIAGNOSIS — Z5900 Homelessness unspecified: Secondary | ICD-10-CM | POA: Diagnosis not present

## 2022-04-20 DIAGNOSIS — F332 Major depressive disorder, recurrent severe without psychotic features: Secondary | ICD-10-CM | POA: Diagnosis not present

## 2022-04-20 DIAGNOSIS — Z9151 Personal history of suicidal behavior: Secondary | ICD-10-CM | POA: Diagnosis not present

## 2022-04-20 MED ORDER — ACETAMINOPHEN 325 MG PO TABS
650.0000 mg | ORAL_TABLET | ORAL | Status: DC | PRN
  Administered 2022-04-20: 650 mg via ORAL
  Filled 2022-04-20: qty 2

## 2022-04-20 NOTE — Progress Notes (Signed)
04/20/2022  0805  [facility]ed [facility] [phone] gave report to Elva. ?

## 2022-04-20 NOTE — ED Notes (Signed)
Evelyn [name] has accepted for tomorrow after 9am. Accepting provider is Dr. Roselyn Reefreque. [place] [address]. Report can be called to [phone]    ?

## 2022-04-20 NOTE — ED Notes (Signed)
Pt complain of pain in her back, legs, arms writer inform the RN she got her some pain meds ?

## 2022-04-20 NOTE — ED Notes (Signed)
Sitter reported pt c/o pain all over and requesting medication. None available, messages provider and received prn orders for tylenol. Medication given - see MAR. ?

## 2022-04-20 NOTE — Progress Notes (Signed)
Pt accepted for 04/20/22 after 9am to [facility] Accepting provider is Dr. Roselyn Reef. [place] [address]. Phone number for report: [phone]   ? ?Pt meets inpatient criteria per Nira Conn, NP. ? ?Care Team notified: Gaetano Net, RN ? ? ?Maryjean Ka, MSW, LCSWA ?04/20/2022 12:06 AM ? ? ?

## 2022-04-20 NOTE — Progress Notes (Signed)
04/20/2022  0815  Called Sheriff [phone] for transport to [facility]. ?

## 2022-04-21 DIAGNOSIS — M791 Myalgia, unspecified site: Secondary | ICD-10-CM | POA: Diagnosis not present

## 2022-04-21 DIAGNOSIS — R0602 Shortness of breath: Secondary | ICD-10-CM | POA: Diagnosis not present

## 2022-04-21 DIAGNOSIS — Z3202 Encounter for pregnancy test, result negative: Secondary | ICD-10-CM | POA: Diagnosis not present

## 2022-04-23 ENCOUNTER — Telehealth: Payer: Self-pay

## 2022-04-23 DIAGNOSIS — F332 Major depressive disorder, recurrent severe without psychotic features: Secondary | ICD-10-CM | POA: Diagnosis not present

## 2022-04-23 NOTE — Telephone Encounter (Signed)
Transition Care Management Unsuccessful Follow-up Telephone Call ? ?Date of discharge and from where:  04/21/2022 from [facility] ? ?Attempts:  1st Attempt ? ?Reason for unsuccessful TCM follow-up call:  Missing or invalid number ? ? ? ?

## 2022-04-24 ENCOUNTER — Telehealth: Payer: Self-pay

## 2022-04-24 DIAGNOSIS — F332 Major depressive disorder, recurrent severe without psychotic features: Secondary | ICD-10-CM | POA: Diagnosis not present

## 2022-04-24 NOTE — Patient Outreach (Signed)
Care Coordination ? ?04/24/2022 ? ?Carmie End ?01/29/1981 ?[id number] ? ?BSW received a referral for patient from [facility]. BSW will make another attempt in 7 days. ? ? ?Medicaid Managed Care  ? ?Unsuccessful Outreach Note ? ?04/24/2022 ?Name: Evelyn [name] MRN: [id number] DOB: 24-Feb-1981 ? ?Referred by: Marcine Matar, MD ?Reason for referral : High Risk Managed Medicaid (MM Social Work Unsuccessful telephone outreach) ? ? ?An unsuccessful telephone outreach was attempted today. The patient was referred to the case management team for assistance with care management and care coordination.  ? ?Follow Up Plan: The care management team will reach out to the patient again over the next 7 days.  ? ?Gus Puma, BSW, MHA ?[facility]  ?High Risk Managed Medicaid Team  ?[phone]  ?

## 2022-04-24 NOTE — Telephone Encounter (Signed)
Transition Care Management Unsuccessful Follow-up Telephone Call ? ?Date of discharge and from where:  04/21/2022 from [facility] ? ?Attempts:  2nd Attempt ? ?Reason for unsuccessful TCM follow-up call:  Missing or invalid number ? ? ? ?

## 2022-04-24 NOTE — Patient Instructions (Signed)
Visit Information ? ?Ms. Evelyn [name]  - as a part of your Medicaid benefit, you are eligible for care management and care coordination services at no cost or copay. I was unable to reach you by phone today but would be happy to help you with your health related needs. Please feel free to call me @ [phone] ? ?A member of the Managed Medicaid care management team will reach out to you again over the next 7 days.  ? ?Gus Puma, BSW, MHA ?[facility]  ?High Risk Managed Medicaid Team  ?[phone]) [phone]  ?

## 2022-04-25 DIAGNOSIS — Z87891 Personal history of nicotine dependence: Secondary | ICD-10-CM | POA: Diagnosis not present

## 2022-04-25 DIAGNOSIS — Z9151 Personal history of suicidal behavior: Secondary | ICD-10-CM | POA: Diagnosis not present

## 2022-04-25 DIAGNOSIS — R45851 Suicidal ideations: Secondary | ICD-10-CM | POA: Diagnosis not present

## 2022-04-25 DIAGNOSIS — F332 Major depressive disorder, recurrent severe without psychotic features: Secondary | ICD-10-CM | POA: Diagnosis not present

## 2022-04-25 DIAGNOSIS — F4321 Adjustment disorder with depressed mood: Secondary | ICD-10-CM | POA: Diagnosis not present

## 2022-04-25 DIAGNOSIS — Z5900 Homelessness unspecified: Secondary | ICD-10-CM | POA: Diagnosis not present

## 2022-04-25 DIAGNOSIS — M329 Systemic lupus erythematosus, unspecified: Secondary | ICD-10-CM | POA: Diagnosis not present

## 2022-04-25 NOTE — Telephone Encounter (Signed)
Transition Care Management Unsuccessful Follow-up Telephone Call ? ?Date of discharge and from where:  04/21/2022[facility] ? ?Attempts:  3rd Attempt ? ?Reason for unsuccessful TCM follow-up call:  Missing or invalid number ? ?  ?

## 2022-05-01 DIAGNOSIS — M3214 Glomerular disease in systemic lupus erythematosus: Secondary | ICD-10-CM | POA: Diagnosis not present

## 2022-05-01 DIAGNOSIS — Z79899 Other long term (current) drug therapy: Secondary | ICD-10-CM | POA: Diagnosis not present

## 2022-05-01 DIAGNOSIS — D649 Anemia, unspecified: Secondary | ICD-10-CM | POA: Diagnosis not present

## 2022-05-01 DIAGNOSIS — Z9151 Personal history of suicidal behavior: Secondary | ICD-10-CM | POA: Diagnosis not present

## 2022-05-01 DIAGNOSIS — M797 Fibromyalgia: Secondary | ICD-10-CM | POA: Diagnosis not present

## 2022-05-01 DIAGNOSIS — Z113 Encounter for screening for infections with a predominantly sexual mode of transmission: Secondary | ICD-10-CM | POA: Diagnosis not present

## 2022-05-01 DIAGNOSIS — F410 Panic disorder [episodic paroxysmal anxiety] without agoraphobia: Secondary | ICD-10-CM | POA: Diagnosis not present

## 2022-05-01 DIAGNOSIS — R778 Other specified abnormalities of plasma proteins: Secondary | ICD-10-CM | POA: Diagnosis not present

## 2022-05-01 DIAGNOSIS — M8580 Other specified disorders of bone density and structure, unspecified site: Secondary | ICD-10-CM | POA: Diagnosis not present

## 2022-05-01 DIAGNOSIS — M329 Systemic lupus erythematosus, unspecified: Secondary | ICD-10-CM | POA: Diagnosis not present

## 2022-05-01 DIAGNOSIS — F332 Major depressive disorder, recurrent severe without psychotic features: Secondary | ICD-10-CM | POA: Diagnosis not present

## 2022-05-23 ENCOUNTER — Ambulatory Visit: Payer: Medicaid Other | Admitting: Family Medicine

## 2022-05-23 DIAGNOSIS — M3214 Glomerular disease in systemic lupus erythematosus: Secondary | ICD-10-CM | POA: Diagnosis not present

## 2022-05-25 ENCOUNTER — Other Ambulatory Visit: Payer: Self-pay

## 2022-05-25 ENCOUNTER — Observation Stay (HOSPITAL_BASED_OUTPATIENT_CLINIC_OR_DEPARTMENT_OTHER)
Admission: EM | Admit: 2022-05-25 | Discharge: 2022-05-26 | Disposition: A | Discharge status: Home / Self Care | Payer: Medicaid Other | Attending: Family Medicine | Admitting: Family Medicine

## 2022-05-25 ENCOUNTER — Encounter (HOSPITAL_BASED_OUTPATIENT_CLINIC_OR_DEPARTMENT_OTHER): Payer: Self-pay

## 2022-05-25 DIAGNOSIS — R9431 Abnormal electrocardiogram [ECG] [EKG]: Secondary | ICD-10-CM | POA: Insufficient documentation

## 2022-05-25 DIAGNOSIS — M329 Systemic lupus erythematosus, unspecified: Secondary | ICD-10-CM | POA: Diagnosis present

## 2022-05-25 DIAGNOSIS — R1110 Vomiting, unspecified: Secondary | ICD-10-CM | POA: Diagnosis present

## 2022-05-25 DIAGNOSIS — R112 Nausea with vomiting, unspecified: Secondary | ICD-10-CM | POA: Diagnosis not present

## 2022-05-25 DIAGNOSIS — F12988 Cannabis use, unspecified with other cannabis-induced disorder: Secondary | ICD-10-CM | POA: Diagnosis not present

## 2022-05-25 DIAGNOSIS — F32A Depression, unspecified: Secondary | ICD-10-CM | POA: Diagnosis present

## 2022-05-25 DIAGNOSIS — I472 Ventricular tachycardia, unspecified: Principal | ICD-10-CM | POA: Insufficient documentation

## 2022-05-25 DIAGNOSIS — I4729 Other ventricular tachycardia: Secondary | ICD-10-CM

## 2022-05-25 LAB — COMPREHENSIVE METABOLIC PANEL
ALT: 18 U/L (ref 0–44)
AST: 28 U/L (ref 15–41)
Albumin: 4.6 g/dL (ref 3.5–5.0)
Alkaline Phosphatase: 66 U/L (ref 38–126)
Anion gap: 14 (ref 5–15)
BUN: 13 mg/dL (ref 6–20)
CO2: 25 mmol/L (ref 22–32)
Calcium: 10.2 mg/dL (ref 8.9–10.3)
Chloride: 103 mmol/L (ref 98–111)
Creatinine, Ser: 0.64 mg/dL (ref 0.44–1.00)
GFR, Estimated: >60 mL/min (ref >60)
Glucose, Bld: 108 mg/dL — ABNORMAL HIGH (ref 70–99)
Potassium: 3.9 mmol/L (ref 3.5–5.1)
Sodium: 142 mmol/L (ref 135–145)
Total Bilirubin: 0.5 mg/dL (ref 0.3–1.2)
Total Protein: 9.2 g/dL — ABNORMAL HIGH (ref 6.5–8.1)

## 2022-05-25 LAB — URINALYSIS, ROUTINE W REFLEX MICROSCOPIC
Bilirubin Urine: NEGATIVE
Glucose, UA: NEGATIVE mg/dL
Hgb urine dipstick: NEGATIVE
Ketones, ur: NEGATIVE mg/dL
Leukocytes,Ua: NEGATIVE
Nitrite: NEGATIVE
Protein, ur: 100 mg/dL — AB
Specific Gravity, Urine: 1.01 (ref 1.005–1.030)
pH: 8 (ref 5.0–8.0)

## 2022-05-25 LAB — TROPONIN I (HIGH SENSITIVITY): Troponin I (High Sensitivity): 3 ng/L (ref <18)

## 2022-05-25 LAB — RAPID URINE DRUG SCREEN, HOSP PERFORMED
Amphetamines: NOT DETECTED
Barbiturates: NOT DETECTED
Benzodiazepines: NOT DETECTED
Cocaine: NOT DETECTED
Opiates: NOT DETECTED
Tetrahydrocannabinol: POSITIVE — AB

## 2022-05-25 LAB — CBC
HCT: 35.9 % — ABNORMAL LOW (ref 36.0–46.0)
Hemoglobin: 11.4 g/dL — ABNORMAL LOW (ref 12.0–15.0)
MCH: 28.8 pg (ref 26.0–34.0)
MCHC: 31.8 g/dL (ref 30.0–36.0)
MCV: 90.7 fL (ref 80.0–100.0)
Platelets: 296 10*3/uL (ref 150–400)
RBC: 3.96 MIL/uL (ref 3.87–5.11)
RDW: 11.9 % (ref 11.5–15.5)
WBC: 10.9 10*3/uL — ABNORMAL HIGH (ref 4.0–10.5)
nRBC: 0 % (ref 0.0–0.2)

## 2022-05-25 LAB — LIPASE, BLOOD: Lipase: 18 U/L (ref 11–51)

## 2022-05-25 LAB — CBG MONITORING, ED: Glucose-Capillary: 159 mg/dL — ABNORMAL HIGH (ref 70–99)

## 2022-05-25 LAB — MAGNESIUM: Magnesium: 1.7 mg/dL (ref 1.7–2.4)

## 2022-05-25 LAB — PREGNANCY, URINE: Preg Test, Ur: NEGATIVE

## 2022-05-25 MED ORDER — HALOPERIDOL LACTATE 5 MG/ML IJ SOLN
5.0000 mg | Freq: Once | INTRAMUSCULAR | Status: AC
  Administered 2022-05-25: 5 mg via INTRAVENOUS
  Filled 2022-05-25: qty 1

## 2022-05-25 MED ORDER — MAGNESIUM SULFATE 2 GM/50ML IV SOLN
INTRAVENOUS | Status: AC
  Administered 2022-05-25: 2 g via INTRAVENOUS
  Filled 2022-05-25: qty 50

## 2022-05-25 MED ORDER — ONDANSETRON 4 MG PO TBDP
4.0000 mg | ORAL_TABLET | Freq: Once | ORAL | Status: AC | PRN
Start: 2022-05-25 — End: 2022-05-25
  Administered 2022-05-25: 4 mg via ORAL
  Filled 2022-05-25: qty 1

## 2022-05-25 MED ORDER — CAPSAICIN 0.075 % EX CREA
TOPICAL_CREAM | Freq: Two times a day (BID) | CUTANEOUS | Status: DC
  Filled 2022-05-25 (×2): qty 60

## 2022-05-25 MED ORDER — LACTATED RINGERS IV BOLUS
2000.0000 mL | Freq: Once | INTRAVENOUS | Status: AC
  Administered 2022-05-25: 2000 mL via INTRAVENOUS

## 2022-05-25 MED ORDER — POTASSIUM CHLORIDE 10 MEQ/100ML IV SOLN
10.0000 meq | INTRAVENOUS | Status: AC
  Administered 2022-05-25 (×2): 10 meq via INTRAVENOUS
  Filled 2022-05-25 (×2): qty 100

## 2022-05-25 MED ORDER — MAGNESIUM SULFATE 2 GM/50ML IV SOLN
2.0000 g | Freq: Once | INTRAVENOUS | Status: AC
  Filled 2022-05-25: qty 50

## 2022-05-25 MED ORDER — MAGNESIUM SULFATE 2 GM/50ML IV SOLN
2.0000 g | Freq: Once | INTRAVENOUS | Status: AC
  Administered 2022-05-25: 2 g via INTRAVENOUS

## 2022-05-25 NOTE — ED Notes (Signed)
Pt informed that she cannot have anything to drink yet due to the abd discomfort. When this RN returned to the room the pt was found drinking out of the sink. This RN assisted pt back to bed and pt was re-educated on not drinking or eating until cleared by MD

## 2022-05-25 NOTE — ED Notes (Signed)
Rounded on pt.   Pt stating that she needs to use the bathroom. When this RN asked pt if she remembers anything she said no. Pt given an update and denies having reaction to haldol in the past. Pt was able to use a bedpan.

## 2022-05-25 NOTE — ED Notes (Signed)
Report given to [facility] 

## 2022-05-25 NOTE — ED Triage Notes (Signed)
Pt presents with emesis that started today. Pt was hyperventilating and flailing around in the lobby prior to triage. Pt c/o abd pain. Pt denies any drug or ETOH use. Pt keeps crawling in the floor during triage.

## 2022-05-25 NOTE — ED Notes (Signed)
Placed on 2L [place] per MD

## 2022-05-25 NOTE — ED Triage Notes (Signed)
Pt noted to screen as positive high risk for self harm due to recent attempt in past month. Pt denies SI/HI. States she received treatment. Consulting civil engineer and EDP notified.

## 2022-05-25 NOTE — ED Provider Notes (Signed)
[facility] EMERGENCY DEPT Provider Note   CSN: [id number] Arrival date & time: 05/25/22  1638     History  Chief Complaint  Patient presents with   Emesis    Evelyn [name] is a 41 y.o. female.  HPI Patient reports he has had multiple episodes of vomiting.  She reports she woke up at about 1 PM today and was vomiting and could not stop.  She reports this continued all day long.  She reports she has a lot of diffuse cramping and aching in her stomach.  Patient reports she has had similar episodes previously.  Last reported marijuana use yesterday.  Patient reports last cocaine use was about a month ago.  Patient denies fevers but reports she had chills and sweating.    Home Medications Prior to Admission medications   Medication Sig Start Date End Date Taking? Authorizing Provider  OVER THE COUNTER MEDICATION Take 1 capsule by mouth at bedtime. OTC sleep supplement.    [provider]      Allergies    Alfalfa, Metoclopramide hcl, Alpha blocker quinazolines, Aspirin, Cyclobenzaprine, Elemental sulfur, Enoxaparin, Garlic, Nsaids, Pork-derived products, Tramadol, and Ciprofloxacin    Review of Systems   Review of Systems 10 systems reviewed negative except as per HPI Physical Exam Updated Vital Signs BP 102/74   Pulse 99   Temp 97.6 F (36.4 C)   Resp 17   Wt 56.2 kg   SpO2 100%   BMI 21.28 kg/m  Physical Exam Constitutional:      Comments: On initial evaluation, patient is lying prone in the stretcher.  She has removed her garments except for underwear and is under a thick quilt.  She is holding an emesis bag that has faintly yellow tinted clear thin watery material with small amount of froth on top.  There is no appearance of blood or blood streaking.  HENT:     Head: Normocephalic and atraumatic.     Mouth/Throat:     Pharynx: Oropharynx is clear.  Eyes:     Extraocular Movements: Extraocular movements intact.     Pupils: Pupils are  equal, round, and reactive to light.  Cardiovascular:     Rate and Rhythm: Normal rate and regular rhythm.     Comments: Borderline tachycardia no gross rub or gallop. Pulmonary:     Effort: Pulmonary effort is normal.     Breath sounds: Normal breath sounds.  Abdominal:     Comments: Patient endorses diffuse discomfort to palpation of the abdomen.  Abdomen soft without guarding.  Musculoskeletal:     Comments: Patient has no peripheral edema.  No significant wounds to the extremities.  Hands and feet are warm and dry.  Distal pulses are 2+.  Skin:    Comments: Patient is diaphoretic around the head and the neck.  Neurological:     Comments: No focal neurologic deficits.    ED Results / Procedures / Treatments   Labs (all labs ordered are listed, but only abnormal results are displayed) Labs Reviewed  COMPREHENSIVE METABOLIC PANEL - Abnormal; Notable for the following components:      Result Value   Glucose, Bld 108 (*)    Total Protein 9.2 (*)    All other components within normal limits  CBC - Abnormal; Notable for the following components:   WBC 10.9 (*)    Hemoglobin 11.4 (*)    HCT 35.9 (*)    All other components within normal limits  CBG MONITORING, ED - Abnormal;  Notable for the following components:   Glucose-Capillary 159 (*)    All other components within normal limits  LIPASE, BLOOD  MAGNESIUM  URINALYSIS, ROUTINE W REFLEX MICROSCOPIC  PREGNANCY, URINE  RAPID URINE DRUG SCREEN, HOSP PERFORMED  TROPONIN I (HIGH SENSITIVITY)    EKG EKG Interpretation  Date/Time:  Friday May 25 2022 20:36:50 EDT Ventricular Rate:  55 PR Interval:  163 QRS Duration: 100 QT Interval:  695 QTC Calculation: 665 R Axis:   86 Text Interpretation: Sinus rhythm Abnormal T, consider ischemia, anterior leads Prolonged QT interval Agree, PVCs resolved since previous tracing, persistent long QT. Confirmed by Arby Barrette [phone]) on 05/25/2022 9:25:45 PM  Radiology No results  found.  Procedures Procedures   CRITICAL CARE Performed by: Arby Barrette   Total critical care time: 45 minutes  Critical care time was exclusive of separately billable procedures and treating other patients.  Critical care was necessary to treat or prevent imminent or life-threatening deterioration.  Critical care was time spent personally by me on the following activities: development of treatment plan with patient and/or surrogate as well as nursing, discussions with consultants, evaluation of patient's response to treatment, examination of patient, obtaining history from patient or surrogate, ordering and performing treatments and interventions, ordering and review of laboratory studies, ordering and review of radiographic studies, pulse oximetry and re-evaluation of patient's condition.  Medications Ordered in ED Medications  capsicum (ZOSTRIX) 0.075 % cream ( Topical Not Given 05/25/22 2021)  potassium chloride 10 mEq in 100 mL IVPB (10 mEq Intravenous New Bag/Given 05/25/22 2144)  ondansetron (ZOFRAN-ODT) disintegrating tablet 4 mg (4 mg Oral Given 05/25/22 1710)  lactated ringers bolus 2,000 mL (2,000 mLs Intravenous New Bag/Given 05/25/22 2019)  haloperidol lactate (HALDOL) injection 5 mg (5 mg Intravenous Given 05/25/22 2013)  magnesium sulfate IVPB 2 g 50 mL (0 g Intravenous Stopped 05/25/22 2034)  magnesium sulfate IVPB 2 g 50 mL (0 g Intravenous Stopped 05/25/22 2142)    ED Course/ Medical Decision Making/ A&P                           Medical Decision Making Amount and/or Complexity of Data Reviewed Labs: ordered.  Risk OTC drugs. Prescription drug management. Decision regarding hospitalization.  Patient presents with report of constant vomiting.  The observed emesis is very thin water with small amount of froth.  Patient does report marijuana use and history of recurrent vomiting.  On exam, the abdomen is soft without guarding.  Symptoms suggestive of cannabinol  hyperemesis.  Plan for aggressive rehydration and Haldol and capsaicin.  Metabolic panel unremarkable without significant electrolyte derangement.  CBC with mild leukocytosis and stable hemoglobin.    20: 30 just after patient had Haldol infused, she became briefly unresponsive and had a couple of episodes of self-limited VT about 5-6 beats.     With respiratory at bedside supplemental oxygen provided with Ambu bag however patient was spontaneously breathing and did not require ventilatory support.  Immediately upon my arrival patient had good peripheral pulses intact.  Monitor did show frequent PVCs and bradycardia in the 50s.  The patient was arousable.  She was diaphoretic.  2 g of magnesium infused and 2 L of lactated Ringer's initiated.  Heart rate improved to rate of 60s with regular narrow complex.  Repeat EKG shows QTc prolonged greater than 600.  Patient reassessed and is arousable and breathing spontaneously.  She denies any nausea or chest pain.  Repeat  EKG has a dynamic T waves in V3 and V4.  QRS is narrow complex QTc 665.  Additional 2 g magnesium to infuse over an hour ordered.  We will continue careful monitoring.  Multiple  rhythm rechecks on monitor shows sinus rhythm in the 70s (21:17).  Consult: 2110 reviewed with Dr. Everardo All intensivist.  She will assess bed placement and call back for recommendation. Dr. Everardo All has discussed the case with cardiology.  At this time patient does not require ICU admission.  Appropriate for telemetry continuous monitoring and serial EKGs.  Consult: Reviewed with Dr. Toniann Fail for admission to [facility].  Patient presented with symptoms very suggestive of cannabinol hyperemesis.  With soft abdomen and otherwise normal vital signs, I had low suspicion for surgical intra-abdominal etiology for intractable vomiting of clear liquids.  Patient was continuing to drink water out of the sink before initiating treatment for nausea and  vomiting.  She was also attempting to gag herself ostensibly to relieve symptoms.  After administration of Haldol patient had a couple of short bouts of VT consistent with Haldol induced QT prolongation.  This did respond well to magnesium and hydration.  Patient will need continued observation until QT interval has corrected.  However, patient's symptoms do seem well controlled at this time.  She is not having any further vomiting or nausea.  Patient is no longer diaphoretic.  She is resting quietly and is arousable to light stimulus.        Final Clinical Impression(s) / ED Diagnoses Final diagnoses:  Cannabinoid hyperemesis syndrome  QT prolongation  Paroxysmal VT (HCC)    Rx / DC Orders ED Discharge Orders     None         Arby Barrette, MD 05/25/22 2156

## 2022-05-25 NOTE — ED Notes (Signed)
RT called to pt room for unresponsiveness and  RT assessed pt and placed pt on [place] 2 Lpm d/t unresponsiveness. BVM at bedside as precaution. Pt respiratory status is stable w/no distress at this time. Pt current saturations 100% on [place] 2 Lpm. RT will continue to monitor.

## 2022-05-25 NOTE — ED Triage Notes (Signed)
Pt does state marijuana use with last use yesterday.

## 2022-05-25 NOTE — ED Notes (Signed)
Pt received Haldol as order and became unresponsive to verbal stimuli. Pt eyes rolled to the back of her head. When this RN attempted to communicate with pt again pt open her eyes and said she does not feel right. Another RN joined at bedside and pt started to have another episode where she became unresponsive. MD and charge RN arrived at bedside    VO for magnesium 2g bolus given   VO for add on labs magnesium and trop // lab notified

## 2022-05-25 NOTE — ED Notes (Signed)
This RN went to retrieve pt to bring her back to her room. Pt with her emesis bag lying on the floor. Pt gagging herself to make herself vomit. Pt had vomited on the floor on top of her emesis bag. Pt continued to lay on the couch and attempt to make herself vomit. This RN was finally able to convince pt to ger up and back in the wheel chair

## 2022-05-25 NOTE — ED Notes (Signed)
Per Dr. Donnald Garre pt does not need to be 1 on 1 for SI precautions at this time.

## 2022-05-25 NOTE — ED Notes (Signed)
Rounded on pt   Pt drowsy but is Aox2 (person and place) // VSS  will continue to monitor

## 2022-05-26 DIAGNOSIS — R112 Nausea with vomiting, unspecified: Secondary | ICD-10-CM | POA: Diagnosis not present

## 2022-05-26 DIAGNOSIS — F32A Depression, unspecified: Secondary | ICD-10-CM | POA: Diagnosis not present

## 2022-05-26 DIAGNOSIS — M329 Systemic lupus erythematosus, unspecified: Secondary | ICD-10-CM | POA: Diagnosis not present

## 2022-05-26 DIAGNOSIS — I472 Ventricular tachycardia, unspecified: Secondary | ICD-10-CM

## 2022-05-26 DIAGNOSIS — F12988 Cannabis use, unspecified with other cannabis-induced disorder: Secondary | ICD-10-CM | POA: Diagnosis not present

## 2022-05-26 DIAGNOSIS — R9431 Abnormal electrocardiogram [ECG] [EKG]: Secondary | ICD-10-CM

## 2022-05-26 LAB — COMPREHENSIVE METABOLIC PANEL
ALT: 20 U/L (ref 0–44)
AST: 25 U/L (ref 15–41)
Albumin: 3.4 g/dL — ABNORMAL LOW (ref 3.5–5.0)
Alkaline Phosphatase: 58 U/L (ref 38–126)
Anion gap: 6 (ref 5–15)
BUN: 9 mg/dL (ref 6–20)
CO2: 27 mmol/L (ref 22–32)
Calcium: 9 mg/dL (ref 8.9–10.3)
Chloride: 110 mmol/L (ref 98–111)
Creatinine, Ser: 0.68 mg/dL (ref 0.44–1.00)
GFR, Estimated: >60 mL/min (ref >60)
Glucose, Bld: 103 mg/dL — ABNORMAL HIGH (ref 70–99)
Potassium: 3.9 mmol/L (ref 3.5–5.1)
Sodium: 143 mmol/L (ref 135–145)
Total Bilirubin: 0.4 mg/dL (ref 0.3–1.2)
Total Protein: 7.2 g/dL (ref 6.5–8.1)

## 2022-05-26 LAB — CBC WITH DIFFERENTIAL/PLATELET
Abs Immature Granulocytes: 0.02 10*3/uL (ref 0.00–0.07)
Basophils Absolute: 0 10*3/uL (ref 0.0–0.1)
Basophils Relative: 1 %
Eosinophils Absolute: 0 10*3/uL (ref 0.0–0.5)
Eosinophils Relative: 0 %
HCT: 30.1 % — ABNORMAL LOW (ref 36.0–46.0)
Hemoglobin: 9.7 g/dL — ABNORMAL LOW (ref 12.0–15.0)
Immature Granulocytes: 0 %
Lymphocytes Relative: 24 %
Lymphs Abs: 2 10*3/uL (ref 0.7–4.0)
MCH: 29.5 pg (ref 26.0–34.0)
MCHC: 32.2 g/dL (ref 30.0–36.0)
MCV: 91.5 fL (ref 80.0–100.0)
Monocytes Absolute: 0.6 10*3/uL (ref 0.1–1.0)
Monocytes Relative: 7 %
Neutro Abs: 5.5 10*3/uL (ref 1.7–7.7)
Neutrophils Relative %: 68 %
Platelets: 282 10*3/uL (ref 150–400)
RBC: 3.29 MIL/uL — ABNORMAL LOW (ref 3.87–5.11)
RDW: 11.9 % (ref 11.5–15.5)
WBC: 8.1 10*3/uL (ref 4.0–10.5)
nRBC: 0 % (ref 0.0–0.2)

## 2022-05-26 LAB — MAGNESIUM: Magnesium: 2.5 mg/dL — ABNORMAL HIGH (ref 1.7–2.4)

## 2022-05-26 LAB — PHOSPHORUS: Phosphorus: 4.7 mg/dL — ABNORMAL HIGH (ref 2.5–4.6)

## 2022-05-26 LAB — HIV ANTIBODY (ROUTINE TESTING W REFLEX): HIV Screen 4th Generation wRfx: NONREACTIVE

## 2022-05-26 MED ORDER — SERTRALINE HCL 50 MG PO TABS
50.0000 mg | ORAL_TABLET | Freq: Every day | ORAL | Status: DC
  Administered 2022-05-26: 50 mg via ORAL
  Filled 2022-05-26: qty 1

## 2022-05-26 MED ORDER — ACETAMINOPHEN 325 MG PO TABS
650.0000 mg | ORAL_TABLET | Freq: Four times a day (QID) | ORAL | Status: DC | PRN
Start: 2022-05-26 — End: 2022-05-26

## 2022-05-26 MED ORDER — ALBUTEROL SULFATE (2.5 MG/3ML) 0.083% IN NEBU: 2.5000 mg | INHALATION_SOLUTION | Freq: Four times a day (QID) | RESPIRATORY_TRACT | Status: DC | PRN

## 2022-05-26 MED ORDER — ACETAMINOPHEN 650 MG RE SUPP: 650.0000 mg | Freq: Four times a day (QID) | RECTAL | Status: DC | PRN

## 2022-05-26 NOTE — Discharge Summary (Signed)
PatientPhysician Discharge Summary  Evelyn [name] TKZ:[id number] DOB: [birth year] DOA: 05/25/2022  PCP: Marcine Matar, MD  Admit date: 05/25/2022 Discharge date: 05/26/2022    Admitted From: Home Disposition: Home  Recommendations for Outpatient Follow-up:  Follow up with PCP in 1-2 weeks Please obtain BMP/CBC in one week Please follow up with your PCP on the following pending results: Unresulted Labs (From admission, onward)     Start     Ordered   05/26/22 0500  HIV Antibody (routine testing w rflx)  (HIV Antibody (Routine testing w reflex) panel)  Once,   R        05/26/22 0033              Home Health: None Equipment/Devices: None  Discharge Condition: Stable CODE STATUS: Full code Diet recommendation: Regular  Subjective: Seen and examined.  No complaints.  No more nausea.  She is ready to go home.  Following HPI and ED course is copied from admitting hospitalist colleague Dr. Mickie Kay H&P. HPI: Evelyn [name] is a 41 y.o. female, with PMH of marijuana use, lupus who presented to the ER on 05/25/2022 with multiple episodes of vomiting.   At time my exam, patient is very lethargic, thus unable to obtain complete and thorough HPI review of systems.   Currently patient had multiple episodes of vomiting prior to presenting to outside facility.  She woke up around 1 PM and vomiting continue to persist throughout the day.  She had diffuse crampy and aching in her stomach.  She uses marijuana daily.  She reports similar episodes in the past.  She says this goes away with time.  Only medication she takes daily she tells me is Zoloft.       ED course: -Vitals on admission: Afebrile, heart rate 84, blood pressure 134/89, maintaining sats on room air -Labs on initial presentation: Sodium 142, potassium 3.9, bicarb 25, glucose 108, BUN 13, creatinine 0.64, magnesium 1.7 WBC 10.9, hemoglobin 11.4, tox screen positive for THC -Imaging obtained on admission: None -In  the outside ED the patient was given Haldol, IV fluids, capsaicin.  However just after the Haldol infused, she became briefly unresponsive and had a couple episodes of V. tach, 5-6 beats.  EKG also showed a prolonged QTc.  She was given IV fluids, magnesium.  Her rhythm improved to sinus.  ER provider reached out to intensivist who discussed the case with cardiology, and did not recommend ICU admission.  The [facility] service was contacted for admission.  Patient was transferred to [facility] for observation and telemetry monitoring.     Brief/Interim Summary: Patient was briefly hospitalized for nausea and vomiting and an episode of V. tach.  Her symptoms had already resolved, due to prolonged QTc which was found on the initial EKG, she was not started on any antiemetics and overnight, her symptoms remain controlled without any medications and she is able to tolerate p.o. today.  She has no other complaint.  We repeated her EKG and QTc has improved significantly.  Since patient is stable and tolerating diet, she is being discharged and she is agreeable with that decision.  I highly recommended her to quit using marijuana.  Discharge plan was discussed with patient and/or family member and they verbalized understanding and agreed with it.  Discharge Diagnoses:  Principal Problem:   V tach (HCC) Active Problems:   Nausea & vomiting   SLE/Lupus   Depressive disorder   Prolonged QT interval    Discharge Instructions  Allergies as of 05/26/2022       Reactions   Alfalfa Hives, Other (See Comments)   Metoclopramide Hcl Anaphylaxis   Body started contracting, couldn't breathe, throat swelled up   Alpha Blocker Quinazolines Hives, Nausea Only   Aspirin Other (See Comments)   Can cause problems with Lupus   Cyclobenzaprine Diarrhea   Elemental Sulfur Other (See Comments)   Flares up her lupus   Enoxaparin Other (See Comments)   "Spasms of muscles in hands and feet"   Garlic  Diarrhea, Nausea And Vomiting   Haldol [haloperidol] Other (See Comments)   Unresponsive    Nsaids Other (See Comments)   Use with caution if lupus nephritis   Pork-derived Products Diarrhea, Nausea And Vomiting   Tramadol Other (See Comments)   Lupus flare Drug interaction with kidney medicine   Ciprofloxacin Diarrhea, Nausea And Vomiting        Medication List     TAKE these medications    sertraline 50 MG tablet Commonly known as: ZOLOFT Take 50 mg by mouth daily.        Follow-up Information     Marcine Matar, MD Follow up in 1 week(s).   Specialty: Internal Medicine Contact information: [address] [place] [address] [phone]                Allergies  Allergen Reactions   Alfalfa Hives and Other (See Comments)   Metoclopramide Hcl Anaphylaxis    Body started contracting, couldn't breathe, throat swelled up   Alpha Blocker Quinazolines Hives and Nausea Only   Aspirin Other (See Comments)    Can cause problems with Lupus   Cyclobenzaprine Diarrhea   Elemental Sulfur Other (See Comments)    Flares up her lupus    Enoxaparin Other (See Comments)    "Spasms of muscles in hands and feet"   Garlic Diarrhea and Nausea And Vomiting   Haldol [Haloperidol] Other (See Comments)    Unresponsive    Nsaids Other (See Comments)    Use with caution if lupus nephritis   Pork-Derived Products Diarrhea and Nausea And Vomiting   Tramadol Other (See Comments)    Lupus flare Drug interaction with kidney medicine   Ciprofloxacin Diarrhea and Nausea And Vomiting    Consultations: None   Procedures/Studies: No results found.   Discharge Exam: Vitals:   05/26/22 0332 05/26/22 0724  BP: 101/72 109/72  Pulse: 89 66  Resp: 15 14  Temp: 98.5 F (36.9 C) 98.2 F (36.8 C)  SpO2: 100% 100%   Vitals:   05/25/22 2338 05/26/22 0000 05/26/22 0332 05/26/22 0724  BP: 116/71 101/72 101/72 109/72  Pulse: 71 74 89 66  Resp: Temp: 97.9 F (36.6 C)  98.5 F (36.9 C) 98.2 F (36.8 C)  TempSrc: Oral  Oral Oral  SpO2: 100% 100% 100% 100%  Weight:        General: Pt is alert, awake, not in acute distress Cardiovascular: RRR, S1/S2 +, no rubs, no gallops Respiratory: CTA bilaterally, no wheezing, no rhonchi Abdominal: Soft, NT, ND, bowel sounds + Extremities: no edema, no cyanosis    The results of significant diagnostics from this hospitalization (including imaging, microbiology, ancillary and laboratory) are listed below for reference.     Microbiology: No results found for this or any previous visit (from the past 240 hour(s)).   Labs: BNP (last 3 results) No results for input(s): BNP in the last 8760 hours.  Basic Metabolic Panel: Recent Labs  Lab 05/25/22 1945 05/26/22 0218  NA 142 143  K 3.9 3.9  CL 103 110  CO2 25 27  GLUCOSE 108* 103*  BUN 13 9  CREATININE 0.64 0.68  CALCIUM 10.2 9.0  MG 1.7 2.5*  PHOS  --  4.7*   Liver Function Tests: Recent Labs  Lab 05/25/22 1945 05/26/22 0218  AST 28 25  ALT 18 20  ALKPHOS 66 58  BILITOT 0.5 0.4  PROT 9.2* 7.2  ALBUMIN 4.6 3.4*   Recent Labs  Lab 05/25/22 1945  LIPASE 18   No results for input(s): AMMONIA in the last 168 hours. CBC: Recent Labs  Lab 05/25/22 1945 05/26/22 0218  WBC 10.9* 8.1  NEUTROABS  --  5.5  HGB 11.4* 9.7*  HCT 35.9* 30.1*  MCV 90.7 91.5  PLT 296 282   Cardiac Enzymes: No results for input(s): CKTOTAL, CKMB, CKMBINDEX, TROPONINI in the last 168 hours. BNP: Invalid input(s): POCBNP CBG: Recent Labs  Lab 05/25/22 2035  GLUCAP 159*   D-Dimer No results for input(s): DDIMER in the last 72 hours. Hgb A1c No results for input(s): HGBA1C in the last 72 hours. Lipid Profile No results for input(s): CHOL, HDL, LDLCALC, TRIG, CHOLHDL, LDLDIRECT in the last 72 hours. Thyroid function studies No results for input(s): TSH, T4TOTAL, T3FREE, THYROIDAB in the last 72 hours.  Invalid input(s):  FREET3 Anemia work up No results for input(s): VITAMINB12, FOLATE, FERRITIN, TIBC, IRON, RETICCTPCT in the last 72 hours. Urinalysis    Component Value Date/Time   COLORURINE YELLOW 05/25/2022 2210   APPEARANCEUR HAZY (A) 05/25/2022 2210   LABSPEC 1.010 05/25/2022 2210   PHURINE 8.0 05/25/2022 2210   GLUCOSEU NEGATIVE 05/25/2022 2210   HGBUR NEGATIVE 05/25/2022 2210   BILIRUBINUR NEGATIVE 05/25/2022 2210   BILIRUBINUR negative 01/13/2020 1129   KETONESUR NEGATIVE 05/25/2022 2210   PROTEINUR 100 (A) 05/25/2022 2210   UROBILINOGEN 0.2 01/13/2020 1129   UROBILINOGEN 0.2 08/28/2015 1620   NITRITE NEGATIVE 05/25/2022 2210   LEUKOCYTESUR NEGATIVE 05/25/2022 2210   Sepsis Labs Invalid input(s): PROCALCITONIN,  WBC,  LACTICIDVEN Microbiology No results found for this or any previous visit (from the past 240 hour(s)).   Time coordinating discharge: Over 30 minutes  SIGNED:   Hughie Closs, MD  [facility] 05/26/2022, 9:58 AM *Please note that this is a verbal dictation therefore any spelling or grammatical errors are due to the "[employer]" system interpretation. If 7PM-7AM, please contact night-coverage [email or web address]

## 2022-05-26 NOTE — H&P (Signed)
History and Physical  Patient Name: Evelyn [name]     [id number]    DOB: [birth year]    DOA: 05/25/2022 PCP: Ladell Pier, MD  Patient coming from: Outside hospital but presented from home  Chief Complaint: Multiple episodes of vomiting    HPI: Evelyn [name] is a 41 y.o. female, with PMH of marijuana use, lupus who presented to the ER on 05/25/2022 with multiple episodes of vomiting.  At time my exam, patient is very lethargic, thus unable to obtain complete and thorough HPI review of systems.  Currently patient had multiple episodes of vomiting prior to presenting to outside facility.  She woke up around 1 PM and vomiting continue to persist throughout the day.  She had diffuse crampy and aching in her stomach.  She uses marijuana daily.  She reports similar episodes in the past.  She says this goes away with time.  Only medication she takes daily she tells me is Zoloft.    ED course: -Vitals on admission: Afebrile, heart rate 84, blood pressure 134/89, maintaining sats on room air -Labs on initial presentation: Sodium 142, potassium 3.9, bicarb 25, glucose 108, BUN 13, creatinine 0.64, magnesium 1.7 WBC 10.9, hemoglobin 11.4, tox screen positive for THC -Imaging obtained on admission: None -In the outside ED the patient was given Haldol, IV fluids, capsaicin.  However just after the Haldol infused, she became briefly unresponsive and had a couple episodes of V. tach, 5-6 beats.  EKG also showed a prolonged QTc.  She was given IV fluids, magnesium.  Her rhythm improved to sinus.  ER provider reached out to intensivist who discussed the case with cardiology, and did not recommend ICU admission.  [facility] was contacted for admission.  Patient was transferred to [facility] for observation and telemetry monitoring.    ROS: A complete and thorough 12 point review of systems obtained, negative listed in HPI.     Past Medical History:  Diagnosis Date    Anemia    Anxiety    Arthritis    rheumatoid   Depression    Headache(784.0)    Hx of chlamydia infection 2012   Hx of gonorrhea 2011   Hx of trichomoniasis 2015   Lupus ([place])    Lupus nephritis ([place])    Osteonecrosis ([place])    bilateral legs, worse in right hip.  D/t lupus   Pericardial effusion    Intermittent. Associated with lupus.    Past Surgical History:  Procedure Laterality Date   DILATION AND EVACUATION  11/10   ESOPHAGOGASTRODUODENOSCOPY Left 10/07/2016   Procedure: ESOPHAGOGASTRODUODENOSCOPY (EGD);  Surgeon: Teena Irani, MD;  Location: [facility];  Service: Gastroenterology;  Laterality: Left;   LAPAROSCOPIC TUBAL LIGATION  07/10/2011   Procedure: LAPAROSCOPIC TUBAL LIGATION;  Surgeon: Delice Lesch, MD;  Location: [facility];  Service: Gynecology;  Laterality: Bilateral;  fulguration    Social History: Patient lives at home.  The patient walks without assistance.  Marijuana smoker.  Allergies  Allergen Reactions   Alfalfa Hives and Other (See Comments)   Metoclopramide Hcl Anaphylaxis    Body started contracting, couldn't breathe, throat swelled up   Alpha Blocker Quinazolines Hives and Nausea Only   Aspirin Other (See Comments)    Can cause problems with Lupus   Cyclobenzaprine Diarrhea   Elemental Sulfur Other (See Comments)    Flares up her lupus    Enoxaparin Other (See Comments)    "Spasms of muscles in hands and feet"   Garlic  Diarrhea and Nausea And Vomiting   Haldol [Haloperidol] Other (See Comments)    Unresponsive    Nsaids Other (See Comments)    Use with caution if lupus nephritis   Pork-Derived Products Diarrhea and Nausea And Vomiting   Tramadol Other (See Comments)    Lupus flare Drug interaction with kidney medicine   Ciprofloxacin Diarrhea and Nausea And Vomiting    Family history: family history includes Hypertension in her mother.  Prior to Admission medications   Medication Sig Start Date End Date Taking? Authorizing Provider   OVER THE COUNTER MEDICATION Take 1 capsule by mouth at bedtime. OTC sleep supplement.    [provider]       Physical Exam: BP 116/71 (BP Location: Right Arm)   Pulse 71   Temp 97.9 F (36.6 C) (Oral)   Resp 14   Wt 56.2 kg   SpO2 100%   BMI 21.28 kg/m   General appearance: Well-developed, adult female, lethargic Skin: Warm and dry.  No jaundice.  No suspicious rashes or lesions. Cardiac: RRR, nl S1-S2, no murmurs appreciated.  No LE edema.  Radial and pedal pulses 2+ and symmetric. Respiratory: Normal respiratory rate and rhythm.  CTAB without rales or wheezes. Abdomen: Abdomen soft.  No tenderness with palpation. No ascites, distension, hepatosplenomegaly.   Neuro: Cranial nerves 2 through 12 grossly intact.  Sensation intact to light touch. Speech is fluent.  Marland Kitchen    Psych: Lethargic    Labs on Admission:  I have personally reviewed following labs and imaging studies: CBC: Recent Labs  Lab 05/25/22 1945  WBC 10.9*  HGB 11.4*  HCT 35.9*  MCV 90.7  PLT 0000000   Basic Metabolic Panel: Recent Labs  Lab 05/25/22 1945  NA 142  K 3.9  CL 103  CO2 25  GLUCOSE 108*  BUN 13  CREATININE 0.64  CALCIUM 10.2  MG 1.7   GFR: Estimated Creatinine Clearance: 82.3 mL/min (by C-G formula based on SCr of 0.64 mg/dL).  Liver Function Tests: Recent Labs  Lab 05/25/22 1945  AST 28  ALT 18  ALKPHOS 66  BILITOT 0.5  PROT 9.2*  ALBUMIN 4.6   Recent Labs  Lab 05/25/22 1945  LIPASE 18   No results for input(s): AMMONIA in the last 168 hours. Coagulation Profile: No results for input(s): INR, PROTIME in the last 168 hours. Cardiac Enzymes: No results for input(s): CKTOTAL, CKMB, CKMBINDEX, TROPONINI in the last 168 hours. BNP (last 3 results) No results for input(s): PROBNP in the last 8760 hours. HbA1C: No results for input(s): HGBA1C in the last 72 hours. CBG: Recent Labs  Lab 05/25/22 2035  GLUCAP 159*   Lipid Profile: No results for input(s):  CHOL, HDL, LDLCALC, TRIG, CHOLHDL, LDLDIRECT in the last 72 hours. Thyroid Function Tests: No results for input(s): TSH, T4TOTAL, FREET4, T3FREE, THYROIDAB in the last 72 hours. Anemia Panel: No results for input(s): VITAMINB12, FOLATE, FERRITIN, TIBC, IRON, RETICCTPCT in the last 72 hours.   No results found for this or any previous visit (from the past 240 hour(s)).         Radiological Exams on Admission: Personally reviewed imaging which shows: No results found.  EKG: Independently reviewed.  Prolonged QTc       Assessment/Plan   1.  THC hyperemesis -Daily marijuana smoker and has had similar symptoms in the past - At the time my exam, patient is resting comfortably - We will hold off on any as needed antiemetics, especially those that  prolong QTc - If she has further bouts of emesis, can consider Ativan - Encourage cessation of marijuana  2.  Episode of VT after Haldol administration -After administrating Haldol for her nausea, patient became unresponsive and had a couple episodes of VT, type 5-6 beats -She was given IV fluids and magnesium.  EKG showed prolonged QTc greater than 600 - Eventually patient more responsive and monitor showed sinus rhythm - ER contacted ICU who discussed case with cardiology and recommended observation with telemetry - Repeat EKG this morning - Telemetry - Follow-up CMP and magnesium this morning - Consider cardiology consult in the morning  3.  Depression -Continue home Zoloft  4.  Lupus -Not currently on any medication      DVT prophylaxis: SCDs Code Status: Full Family Communication: none Disposition Plan: Anticipate discharge home when medically optimized Consults called: none Admission status: Observation with telemetry   At the point of initial evaluation, it is my clinical opinion that admission for OBSERVATION is reasonable and necessary because the patient's presenting complaints in the context of their chronic  conditions represent sufficient risk of deterioration or significant morbidity to constitute reasonable grounds for close observation in the hospital setting, but that the patient may be medically stable for discharge from the hospital within 24 to 48 hours.    Medical decision making: Patient seen at 12:28 AM on 05/26/2022.    Clinical condition: fair.        Doran Heater [facility] Please page though [place] or Epic secure chat:  For password, contact charge nurse

## 2022-05-26 NOTE — Plan of Care (Signed)

## 2022-05-29 ENCOUNTER — Telehealth: Payer: Self-pay

## 2022-05-29 NOTE — Telephone Encounter (Signed)
Transition Care Management Unsuccessful Follow-up Telephone Call  Date of discharge and from where:  05/26/2022 from [facility]  Attempts:  1st Attempt  Reason for unsuccessful TCM follow-up call:  No answer/busy

## 2022-05-29 NOTE — Telephone Encounter (Signed)
Transition Care Management Unsuccessful Follow-up Telephone Call  Date of discharge and from where:  05/26/2022, [facility]  Attempts:  1st Attempt  Reason for unsuccessful TCM follow-up call:  Unable to reach patient- call placed to # [phone], the phone rang, then went to fast busy.  Dr Wynetta Emery is listed as PCP but has not seen the patient in over 2 years. Need to inquire if she has another PCP or would like to schedule follow up with Dr Wynetta Emery at [facility]

## 2022-05-30 ENCOUNTER — Telehealth: Payer: Self-pay
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
## 2022-05-30 NOTE — Telephone Encounter (Signed)
CHW is doing TOC call..   Closing this encounter.

## 2022-06-14 ENCOUNTER — Ambulatory Visit: Payer: Medicaid Other | Admitting: Internal Medicine
